# Patient Record
Sex: Female | Born: 1950
Health system: Southern US, Community
[De-identification: ages and names within clinical notes are randomized; demographics above are authoritative.]

## PROBLEM LIST (undated history)

## (undated) DIAGNOSIS — Z974 Presence of external hearing-aid: Secondary | ICD-10-CM

## (undated) DIAGNOSIS — I10 Essential (primary) hypertension: Secondary | ICD-10-CM

## (undated) DIAGNOSIS — N289 Disorder of kidney and ureter, unspecified: Secondary | ICD-10-CM

## (undated) DIAGNOSIS — K5792 Diverticulitis of intestine, part unspecified, without perforation or abscess without bleeding: Secondary | ICD-10-CM

## (undated) DIAGNOSIS — E785 Hyperlipidemia, unspecified: Secondary | ICD-10-CM

## (undated) DIAGNOSIS — G4730 Sleep apnea, unspecified: Secondary | ICD-10-CM

## (undated) DIAGNOSIS — I739 Peripheral vascular disease, unspecified: Secondary | ICD-10-CM

## (undated) DIAGNOSIS — R51 Headache: Secondary | ICD-10-CM

## (undated) DIAGNOSIS — N189 Chronic kidney disease, unspecified: Secondary | ICD-10-CM

## (undated) DIAGNOSIS — F419 Anxiety disorder, unspecified: Secondary | ICD-10-CM

## (undated) DIAGNOSIS — R569 Unspecified convulsions: Secondary | ICD-10-CM

## (undated) DIAGNOSIS — R519 Headache, unspecified: Secondary | ICD-10-CM

## (undated) DIAGNOSIS — R29898 Other symptoms and signs involving the musculoskeletal system: Secondary | ICD-10-CM

## (undated) DIAGNOSIS — F329 Major depressive disorder, single episode, unspecified: Secondary | ICD-10-CM

## (undated) DIAGNOSIS — M1990 Unspecified osteoarthritis, unspecified site: Secondary | ICD-10-CM

## (undated) DIAGNOSIS — T7840XA Allergy, unspecified, initial encounter: Secondary | ICD-10-CM

## (undated) DIAGNOSIS — E119 Type 2 diabetes mellitus without complications: Secondary | ICD-10-CM

## (undated) DIAGNOSIS — F32A Depression, unspecified: Secondary | ICD-10-CM

## (undated) DIAGNOSIS — E669 Obesity, unspecified: Secondary | ICD-10-CM

## (undated) DIAGNOSIS — E1140 Type 2 diabetes mellitus with diabetic neuropathy, unspecified: Secondary | ICD-10-CM

## (undated) DIAGNOSIS — Z9189 Other specified personal risk factors, not elsewhere classified: Secondary | ICD-10-CM

## (undated) DIAGNOSIS — C801 Malignant (primary) neoplasm, unspecified: Secondary | ICD-10-CM

## (undated) DIAGNOSIS — K219 Gastro-esophageal reflux disease without esophagitis: Secondary | ICD-10-CM

## (undated) DIAGNOSIS — I639 Cerebral infarction, unspecified: Secondary | ICD-10-CM

## (undated) HISTORY — DX: Essential (primary) hypertension: I10

## (undated) HISTORY — DX: Major depressive disorder, single episode, unspecified: F32.9

## (undated) HISTORY — DX: Unspecified osteoarthritis, unspecified site: M19.90

## (undated) HISTORY — DX: Gastro-esophageal reflux disease without esophagitis: K21.9

## (undated) HISTORY — PX: PATELLA FRACTURE SURGERY: SHX735

## (undated) HISTORY — DX: Allergy, unspecified, initial encounter: T78.40XA

## (undated) HISTORY — DX: Type 2 diabetes mellitus without complications: E11.9

## (undated) HISTORY — DX: Other specified personal risk factors, not elsewhere classified: Z91.89

## (undated) HISTORY — DX: Chronic kidney disease, unspecified: N18.9

## (undated) HISTORY — DX: Hyperlipidemia, unspecified: E78.5

## (undated) HISTORY — DX: Depression, unspecified: F32.A

## (undated) HISTORY — DX: Obesity, unspecified: E66.9

## (undated) HISTORY — DX: Diverticulitis of intestine, part unspecified, without perforation or abscess without bleeding: K57.92

---
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
## 1978-03-19 DIAGNOSIS — I639 Cerebral infarction, unspecified: Secondary | ICD-10-CM

## 1978-03-19 HISTORY — DX: Cerebral infarction, unspecified: I63.9

## 1984-03-19 DIAGNOSIS — Z789 Other specified health status: Secondary | ICD-10-CM

## 1984-03-19 HISTORY — PX: ABDOMINAL HYSTERECTOMY: SHX81

## 1984-03-19 HISTORY — DX: Other specified health status: Z78.9

## 2003-03-20 HISTORY — PX: WRIST FRACTURE SURGERY: SHX121

## 2003-04-15 ENCOUNTER — Other Ambulatory Visit: Payer: Self-pay

## 2008-07-29 ENCOUNTER — Ambulatory Visit: Payer: Self-pay | Admitting: Gastroenterology

## 2008-08-03 LAB — HM COLONOSCOPY

## 2008-10-27 ENCOUNTER — Emergency Department: Payer: Self-pay | Admitting: Emergency Medicine

## 2009-09-10 ENCOUNTER — Emergency Department: Payer: Self-pay | Admitting: Unknown Physician Specialty

## 2010-03-19 DIAGNOSIS — E119 Type 2 diabetes mellitus without complications: Secondary | ICD-10-CM

## 2010-03-19 HISTORY — DX: Type 2 diabetes mellitus without complications: E11.9

## 2010-03-19 HISTORY — PX: CHOLECYSTECTOMY: SHX55

## 2010-08-18 ENCOUNTER — Ambulatory Visit: Payer: Self-pay | Admitting: Internal Medicine

## 2010-09-11 ENCOUNTER — Inpatient Hospital Stay: Payer: Self-pay | Admitting: Internal Medicine

## 2010-09-17 ENCOUNTER — Ambulatory Visit: Payer: Self-pay | Admitting: Internal Medicine

## 2010-10-18 ENCOUNTER — Ambulatory Visit: Payer: Self-pay | Admitting: Surgery

## 2010-10-24 ENCOUNTER — Ambulatory Visit: Payer: Self-pay | Admitting: Surgery

## 2010-10-25 LAB — PATHOLOGY REPORT

## 2010-12-20 ENCOUNTER — Ambulatory Visit: Payer: Self-pay | Admitting: Internal Medicine

## 2011-01-18 ENCOUNTER — Ambulatory Visit: Payer: Self-pay | Admitting: Internal Medicine

## 2011-03-14 ENCOUNTER — Emergency Department: Payer: Self-pay | Admitting: Emergency Medicine

## 2011-05-17 ENCOUNTER — Ambulatory Visit: Payer: Self-pay | Admitting: Oncology

## 2011-05-17 LAB — CBC CANCER CENTER
Basophil %: 0.8 %
Eosinophil %: 3.8 %
HCT: 31.9 % — ABNORMAL LOW (ref 35.0–47.0)
HGB: 10.9 g/dL — ABNORMAL LOW (ref 12.0–16.0)
Lymphocyte #: 3.1 x10 3/mm (ref 1.0–3.6)
MCH: 30.1 pg (ref 26.0–34.0)
MCV: 88 fL (ref 80–100)
Monocyte #: 0.6 x10 3/mm (ref 0.0–0.7)
Neutrophil #: 3.8 x10 3/mm (ref 1.4–6.5)
Neutrophil %: 47.6 %
RBC: 3.62 10*6/uL — ABNORMAL LOW (ref 3.80–5.20)
RDW: 14.7 % — ABNORMAL HIGH (ref 11.5–14.5)

## 2011-05-17 LAB — COMPREHENSIVE METABOLIC PANEL
Albumin: 4.1 g/dL (ref 3.4–5.0)
Alkaline Phosphatase: 97 U/L (ref 50–136)
Calcium, Total: 9.2 mg/dL (ref 8.5–10.1)
EGFR (Non-African Amer.): 35 — ABNORMAL LOW
Glucose: 199 mg/dL — ABNORMAL HIGH (ref 65–99)
Osmolality: 285 (ref 275–301)
SGOT(AST): 24 U/L (ref 15–37)
SGPT (ALT): 33 U/L
Sodium: 138 mmol/L (ref 136–145)
Total Protein: 8.2 g/dL (ref 6.4–8.2)

## 2011-05-18 ENCOUNTER — Ambulatory Visit: Payer: Self-pay | Admitting: Oncology

## 2011-11-15 ENCOUNTER — Ambulatory Visit: Payer: Self-pay | Admitting: Oncology

## 2011-11-15 LAB — BASIC METABOLIC PANEL
BUN: 27 mg/dL — ABNORMAL HIGH (ref 7–18)
Chloride: 101 mmol/L (ref 98–107)
Co2: 26 mmol/L (ref 21–32)
Creatinine: 2.09 mg/dL — ABNORMAL HIGH (ref 0.60–1.30)
EGFR (African American): 30 — ABNORMAL LOW
Glucose: 293 mg/dL — ABNORMAL HIGH (ref 65–99)
Potassium: 4.2 mmol/L (ref 3.5–5.1)
Sodium: 135 mmol/L — ABNORMAL LOW (ref 136–145)

## 2011-11-15 LAB — CBC CANCER CENTER
Basophil %: 0.9 %
Eosinophil #: 0.2 x10 3/mm (ref 0.0–0.7)
HCT: 37.4 % (ref 35.0–47.0)
HGB: 12.3 g/dL (ref 12.0–16.0)
Lymphocyte %: 25.1 %
MCH: 29.8 pg (ref 26.0–34.0)
MCHC: 32.8 g/dL (ref 32.0–36.0)
Monocyte #: 0.9 x10 3/mm (ref 0.2–0.9)
Neutrophil #: 6.9 x10 3/mm — ABNORMAL HIGH (ref 1.4–6.5)
WBC: 10.8 x10 3/mm (ref 3.6–11.0)

## 2011-11-16 LAB — PROT IMMUNOELECTROPHORES(ARMC)

## 2011-11-18 ENCOUNTER — Ambulatory Visit: Payer: Self-pay | Admitting: Oncology

## 2012-05-13 ENCOUNTER — Ambulatory Visit: Payer: Self-pay | Admitting: Oncology

## 2012-05-17 ENCOUNTER — Ambulatory Visit: Payer: Self-pay | Admitting: Oncology

## 2012-06-12 LAB — CBC CANCER CENTER
Basophil #: 0.1 x10 3/mm (ref 0.0–0.1)
Eosinophil #: 0.3 x10 3/mm (ref 0.0–0.7)
HCT: 39.5 % (ref 35.0–47.0)
HGB: 13.5 g/dL (ref 12.0–16.0)
MCH: 30.3 pg (ref 26.0–34.0)
MCHC: 34 g/dL (ref 32.0–36.0)
MCV: 89 fL (ref 80–100)
Monocyte #: 0.7 x10 3/mm (ref 0.2–0.9)
Neutrophil #: 5.4 x10 3/mm (ref 1.4–6.5)
Neutrophil %: 55.9 %
Platelet: 305 x10 3/mm (ref 150–440)
RBC: 4.44 10*6/uL (ref 3.80–5.20)

## 2012-06-12 LAB — BASIC METABOLIC PANEL
Anion Gap: 8 (ref 7–16)
BUN: 28 mg/dL — ABNORMAL HIGH (ref 7–18)
Calcium, Total: 9.1 mg/dL (ref 8.5–10.1)
EGFR (Non-African Amer.): 30 — ABNORMAL LOW
Glucose: 292 mg/dL — ABNORMAL HIGH (ref 65–99)
Osmolality: 288 (ref 275–301)

## 2012-06-17 ENCOUNTER — Ambulatory Visit: Payer: Self-pay | Admitting: Oncology

## 2012-12-09 ENCOUNTER — Ambulatory Visit: Payer: Self-pay | Admitting: Oncology

## 2012-12-09 LAB — BASIC METABOLIC PANEL
Anion Gap: 10 (ref 7–16)
Chloride: 101 mmol/L (ref 98–107)
Co2: 27 mmol/L (ref 21–32)
Creatinine: 1.44 mg/dL — ABNORMAL HIGH (ref 0.60–1.30)
EGFR (Non-African Amer.): 40 — ABNORMAL LOW
Glucose: 266 mg/dL — ABNORMAL HIGH (ref 65–99)
Osmolality: 288 (ref 275–301)
Potassium: 4.4 mmol/L (ref 3.5–5.1)
Sodium: 138 mmol/L (ref 136–145)

## 2012-12-09 LAB — CBC CANCER CENTER
Basophil #: 0.1 x10 3/mm (ref 0.0–0.1)
Basophil %: 1.2 %
Eosinophil %: 3.8 %
HCT: 38.7 % (ref 35.0–47.0)
Lymphocyte %: 34.8 %
MCH: 30 pg (ref 26.0–34.0)
MCHC: 33.8 g/dL (ref 32.0–36.0)
Neutrophil #: 4 x10 3/mm (ref 1.4–6.5)
Neutrophil %: 51 %
Platelet: 303 x10 3/mm (ref 150–440)
RDW: 13.6 % (ref 11.5–14.5)
WBC: 7.8 x10 3/mm (ref 3.6–11.0)

## 2012-12-10 LAB — KAPPA/LAMBDA FREE LIGHT CHAINS (ARMC)

## 2012-12-10 LAB — PROT IMMUNOELECTROPHORES(ARMC)

## 2012-12-12 LAB — URINE IEP, RANDOM

## 2012-12-17 ENCOUNTER — Ambulatory Visit: Payer: Self-pay | Admitting: Oncology

## 2013-06-08 ENCOUNTER — Ambulatory Visit: Payer: Self-pay | Admitting: Oncology

## 2013-06-09 LAB — CBC CANCER CENTER
BASOS PCT: 1.2 %
Basophil #: 0.1 x10 3/mm (ref 0.0–0.1)
Eosinophil #: 0.3 x10 3/mm (ref 0.0–0.7)
Eosinophil %: 4.2 %
HCT: 39.2 % (ref 35.0–47.0)
HGB: 12.8 g/dL (ref 12.0–16.0)
Lymphocyte #: 3.6 x10 3/mm (ref 1.0–3.6)
Lymphocyte %: 43.5 %
MCH: 29.5 pg (ref 26.0–34.0)
MCHC: 32.6 g/dL (ref 32.0–36.0)
MCV: 90 fL (ref 80–100)
MONO ABS: 0.7 x10 3/mm (ref 0.2–0.9)
MONOS PCT: 8.1 %
NEUTROS ABS: 3.6 x10 3/mm (ref 1.4–6.5)
NEUTROS PCT: 43 %
Platelet: 332 x10 3/mm (ref 150–440)
RBC: 4.34 10*6/uL (ref 3.80–5.20)
RDW: 13.7 % (ref 11.5–14.5)
WBC: 8.3 x10 3/mm (ref 3.6–11.0)

## 2013-06-09 LAB — BASIC METABOLIC PANEL
ANION GAP: 10 (ref 7–16)
BUN: 24 mg/dL — ABNORMAL HIGH (ref 7–18)
CO2: 28 mmol/L (ref 21–32)
Calcium, Total: 10.3 mg/dL — ABNORMAL HIGH (ref 8.5–10.1)
Chloride: 101 mmol/L (ref 98–107)
Creatinine: 1.3 mg/dL (ref 0.60–1.30)
EGFR (Non-African Amer.): 45 — ABNORMAL LOW
GFR CALC AF AMER: 52 — AB
GLUCOSE: 194 mg/dL — AB (ref 65–99)
OSMOLALITY: 287 (ref 275–301)
POTASSIUM: 3.9 mmol/L (ref 3.5–5.1)
Sodium: 139 mmol/L (ref 136–145)

## 2013-06-10 LAB — KAPPA/LAMBDA FREE LIGHT CHAINS (ARMC)

## 2013-06-10 LAB — PROT IMMUNOELECTROPHORES(ARMC)

## 2013-06-11 LAB — URINE IEP, RANDOM

## 2013-06-17 ENCOUNTER — Ambulatory Visit: Payer: Self-pay | Admitting: Oncology

## 2013-11-12 ENCOUNTER — Inpatient Hospital Stay: Payer: Self-pay | Admitting: Internal Medicine

## 2013-11-12 LAB — CBC WITH DIFFERENTIAL/PLATELET
BASOS PCT: 1.1 %
Basophil #: 0.1 10*3/uL (ref 0.0–0.1)
Eosinophil #: 0.2 10*3/uL (ref 0.0–0.7)
Eosinophil %: 1.8 %
HCT: 43.1 % (ref 35.0–47.0)
HGB: 14.5 g/dL (ref 12.0–16.0)
Lymphocyte #: 2 10*3/uL (ref 1.0–3.6)
Lymphocyte %: 23.8 %
MCH: 30.1 pg (ref 26.0–34.0)
MCHC: 33.6 g/dL (ref 32.0–36.0)
MCV: 90 fL (ref 80–100)
Monocyte #: 0.5 x10 3/mm (ref 0.2–0.9)
Monocyte %: 6 %
NEUTROS PCT: 67.3 %
Neutrophil #: 5.7 10*3/uL (ref 1.4–6.5)
Platelet: 270 10*3/uL (ref 150–440)
RBC: 4.8 10*6/uL (ref 3.80–5.20)
RDW: 13.2 % (ref 11.5–14.5)
WBC: 8.4 10*3/uL (ref 3.6–11.0)

## 2013-11-12 LAB — DRUG SCREEN, URINE

## 2013-11-12 LAB — COMPREHENSIVE METABOLIC PANEL
ALBUMIN: 3.5 g/dL (ref 3.4–5.0)
ALK PHOS: 167 U/L — AB
Anion Gap: 16 (ref 7–16)
BILIRUBIN TOTAL: 0.7 mg/dL (ref 0.2–1.0)
BUN: 18 mg/dL (ref 7–18)
Calcium, Total: 9.5 mg/dL (ref 8.5–10.1)
Chloride: 93 mmol/L — ABNORMAL LOW (ref 98–107)
Co2: 19 mmol/L — ABNORMAL LOW (ref 21–32)
Creatinine: 1.38 mg/dL — ABNORMAL HIGH (ref 0.60–1.30)
GFR CALC AF AMER: 48 — AB
GFR CALC NON AF AMER: 41 — AB
GLUCOSE: 793 mg/dL — AB (ref 65–99)
Osmolality: 298 (ref 275–301)
Potassium: 4.1 mmol/L (ref 3.5–5.1)
SGOT(AST): 30 U/L (ref 15–37)
SGPT (ALT): 33 U/L
Sodium: 128 mmol/L — ABNORMAL LOW (ref 136–145)
TOTAL PROTEIN: 8 g/dL (ref 6.4–8.2)

## 2013-11-12 LAB — URINALYSIS, COMPLETE
BLOOD: NEGATIVE
Bilirubin,UR: NEGATIVE
Glucose,UR: >=500 mg/dL (ref 0–75)
KETONE: NEGATIVE
LEUKOCYTE ESTERASE: NEGATIVE
Nitrite: NEGATIVE
PH: 6 (ref 4.5–8.0)
RBC,UR: <1 /HPF (ref 0–5)
Specific Gravity: 1.022 (ref 1.003–1.030)

## 2013-11-12 LAB — LIPID PANEL
Cholesterol: 242 mg/dL — ABNORMAL HIGH (ref 0–200)
HDL Cholesterol: 22 mg/dL — ABNORMAL LOW (ref 40–60)
Triglycerides: 1562 mg/dL — ABNORMAL HIGH (ref 0–200)

## 2013-11-12 LAB — ETHANOL: Ethanol: <3 mg/dL

## 2013-11-12 LAB — BASIC METABOLIC PANEL
ANION GAP: 10 (ref 7–16)
BUN: 17 mg/dL (ref 7–18)
CHLORIDE: 105 mmol/L (ref 98–107)
CO2: 23 mmol/L (ref 21–32)
Calcium, Total: 7.7 mg/dL — ABNORMAL LOW (ref 8.5–10.1)
Creatinine: 1.04 mg/dL (ref 0.60–1.30)
EGFR (African American): >60
EGFR (Non-African Amer.): 58 — ABNORMAL LOW
GLUCOSE: 341 mg/dL — AB (ref 65–99)
OSMOLALITY: 291 (ref 275–301)
POTASSIUM: 3.4 mmol/L — AB (ref 3.5–5.1)
Sodium: 138 mmol/L (ref 136–145)

## 2013-11-12 LAB — AMYLASE: Amylase: 84 U/L (ref 25–115)

## 2013-11-12 LAB — LIPASE, BLOOD: LIPASE: 276 U/L (ref 73–393)

## 2013-11-12 LAB — HEMOGLOBIN A1C: Hemoglobin A1C: 13.4 % — ABNORMAL HIGH (ref 4.2–6.3)

## 2013-11-13 LAB — CBC WITH DIFFERENTIAL/PLATELET
BASOS PCT: 1.1 %
Basophil #: 0.1 10*3/uL (ref 0.0–0.1)
Eosinophil #: 0.2 10*3/uL (ref 0.0–0.7)
Eosinophil %: 2.3 %
HCT: 36.3 % (ref 35.0–47.0)
HGB: 12.6 g/dL (ref 12.0–16.0)
Lymphocyte #: 2.7 10*3/uL (ref 1.0–3.6)
Lymphocyte %: 33.5 %
MCH: 30.1 pg (ref 26.0–34.0)
MCHC: 34.6 g/dL (ref 32.0–36.0)
MCV: 87 fL (ref 80–100)
Monocyte #: 0.5 x10 3/mm (ref 0.2–0.9)
Monocyte %: 6.6 %
NEUTROS ABS: 4.5 10*3/uL (ref 1.4–6.5)
NEUTROS PCT: 56.5 %
Platelet: 251 10*3/uL (ref 150–440)
RBC: 4.17 10*6/uL (ref 3.80–5.20)
RDW: 13 % (ref 11.5–14.5)
WBC: 7.9 10*3/uL (ref 3.6–11.0)

## 2013-11-13 LAB — BASIC METABOLIC PANEL
ANION GAP: 9 (ref 7–16)
BUN: 8 mg/dL (ref 7–18)
Calcium, Total: 8.1 mg/dL — ABNORMAL LOW (ref 8.5–10.1)
Chloride: 104 mmol/L (ref 98–107)
Co2: 23 mmol/L (ref 21–32)
Creatinine: 0.74 mg/dL (ref 0.60–1.30)
EGFR (African American): >60
EGFR (Non-African Amer.): >60
Glucose: 253 mg/dL — ABNORMAL HIGH (ref 65–99)
OSMOLALITY: 279 (ref 275–301)
POTASSIUM: 4.1 mmol/L (ref 3.5–5.1)
SODIUM: 136 mmol/L (ref 136–145)

## 2013-11-13 LAB — MAGNESIUM: Magnesium: 1.3 mg/dL — ABNORMAL LOW

## 2013-12-08 ENCOUNTER — Ambulatory Visit: Payer: Self-pay | Admitting: Oncology

## 2013-12-08 LAB — CBC CANCER CENTER
BASOS ABS: 0.1 x10 3/mm (ref 0.0–0.1)
Basophil %: 1.4 %
EOS ABS: 0.2 x10 3/mm (ref 0.0–0.7)
Eosinophil %: 2.6 %
HCT: 41.5 % (ref 35.0–47.0)
HGB: 13.9 g/dL (ref 12.0–16.0)
Lymphocyte #: 3 x10 3/mm (ref 1.0–3.6)
Lymphocyte %: 34.8 %
MCH: 29.5 pg (ref 26.0–34.0)
MCHC: 33.4 g/dL (ref 32.0–36.0)
MCV: 88 fL (ref 80–100)
MONO ABS: 0.7 x10 3/mm (ref 0.2–0.9)
MONOS PCT: 8 %
NEUTROS PCT: 53.2 %
Neutrophil #: 4.6 x10 3/mm (ref 1.4–6.5)
Platelet: 323 x10 3/mm (ref 150–440)
RBC: 4.7 10*6/uL (ref 3.80–5.20)
RDW: 12.9 % (ref 11.5–14.5)
WBC: 8.6 x10 3/mm (ref 3.6–11.0)

## 2013-12-08 LAB — BASIC METABOLIC PANEL
ANION GAP: 12 (ref 7–16)
BUN: 17 mg/dL (ref 7–18)
CREATININE: 1.12 mg/dL (ref 0.60–1.30)
Calcium, Total: 9.4 mg/dL (ref 8.5–10.1)
Chloride: 95 mmol/L — ABNORMAL LOW (ref 98–107)
Co2: 24 mmol/L (ref 21–32)
EGFR (African American): >60
GFR CALC NON AF AMER: 53 — AB
GLUCOSE: 347 mg/dL — AB (ref 65–99)
OSMOLALITY: 278 (ref 275–301)
POTASSIUM: 4.1 mmol/L (ref 3.5–5.1)
Sodium: 131 mmol/L — ABNORMAL LOW (ref 136–145)

## 2013-12-10 LAB — URINE IEP, RANDOM

## 2013-12-15 LAB — KAPPA/LAMBDA FREE LIGHT CHAINS (ARMC)

## 2013-12-15 LAB — PROT IMMUNOELECTROPHORES(ARMC)

## 2013-12-17 ENCOUNTER — Ambulatory Visit: Payer: Self-pay | Admitting: Oncology

## 2014-06-15 ENCOUNTER — Ambulatory Visit
Admit: 2014-06-15 | Disposition: A | Discharge status: Home / Self Care | Payer: Self-pay | Attending: Oncology | Admitting: Oncology

## 2014-06-15 LAB — BASIC METABOLIC PANEL
Anion Gap: 5 — ABNORMAL LOW (ref 7–16)
BUN: 23 mg/dL — ABNORMAL HIGH
CREATININE: 1.07 mg/dL — AB
Calcium, Total: 9.1 mg/dL
Chloride: 100 mmol/L — ABNORMAL LOW
Co2: 24 mmol/L
EGFR (African American): >60
EGFR (Non-African Amer.): 56 — ABNORMAL LOW
GLUCOSE: 405 mg/dL — AB
Potassium: 4.6 mmol/L
SODIUM: 129 mmol/L — AB

## 2014-06-15 LAB — CBC CANCER CENTER
BASOS PCT: 0.9 %
Basophil #: 0.1 x10 3/mm (ref 0.0–0.1)
Eosinophil #: 0.3 x10 3/mm (ref 0.0–0.7)
Eosinophil %: 3.1 %
HCT: 40.6 % (ref 35.0–47.0)
HGB: 13.4 g/dL (ref 12.0–16.0)
Lymphocyte #: 3.3 x10 3/mm (ref 1.0–3.6)
Lymphocyte %: 34.9 %
MCH: 29.9 pg (ref 26.0–34.0)
MCHC: 32.9 g/dL (ref 32.0–36.0)
MCV: 91 fL (ref 80–100)
MONO ABS: 0.7 x10 3/mm (ref 0.2–0.9)
Monocyte %: 7.4 %
NEUTROS PCT: 53.7 %
Neutrophil #: 5.1 x10 3/mm (ref 1.4–6.5)
PLATELETS: 310 x10 3/mm (ref 150–440)
RBC: 4.47 10*6/uL (ref 3.80–5.20)
RDW: 14 % (ref 11.5–14.5)
WBC: 9.4 x10 3/mm (ref 3.6–11.0)

## 2014-06-16 LAB — PROT IMMUNOELECTROPHORES(ARMC)

## 2014-06-16 LAB — URINE IEP, RANDOM

## 2014-06-16 LAB — KAPPA/LAMBDA FREE LIGHT CHAINS (ARMC)

## 2014-06-18 ENCOUNTER — Ambulatory Visit
Admit: 2014-06-18 | Disposition: A | Discharge status: Home / Self Care | Payer: Self-pay | Attending: Oncology | Admitting: Oncology

## 2014-07-02 LAB — LIPID PANEL
Cholesterol: 166 mg/dL (ref 0–200)
HDL: 36 mg/dL (ref 35–70)
Triglycerides: 488 mg/dL — AB (ref 40–160)

## 2014-07-02 LAB — HEPATIC FUNCTION PANEL
ALT: 24 U/L (ref 7–35)
AST: 35 U/L (ref 13–35)
Alkaline Phosphatase: 149 U/L — AB (ref 25–125)
BILIRUBIN, TOTAL: 0.3 mg/dL

## 2014-07-02 LAB — BASIC METABOLIC PANEL
BUN: 20 mg/dL (ref 4–21)
Creatinine: 1 mg/dL (ref 0.5–1.1)
GLUCOSE: 307 mg/dL
POTASSIUM: 4.8 mmol/L (ref 3.4–5.3)
SODIUM: 140 mmol/L (ref 137–147)

## 2014-07-02 LAB — HEMOGLOBIN A1C: HEMOGLOBIN A1C: 12.7 % — AB (ref 4.0–6.0)

## 2014-07-06 ENCOUNTER — Encounter: Payer: Self-pay | Admitting: *Deleted

## 2014-07-10 NOTE — H&P (Signed)
PATIENT NAME:  Kara Leach, Kara Leach MR#:  [id number] DATE OF BIRTH:  [birth year]  DATE OF ADMISSION:  11/12/2013    ADDENDUM  ASSESSMENT AND PLAN: -Right leg swelling. We will check venous Doppler. -Pseudohyponatremia,  this will be corrected when her blood sugar is corrected.    ____________________________ Albertine Patricia, MD dse:dw D: 11/12/2013 02:56:58 ET T: 11/12/2013 04:09:27 ET JOB#: [id number]  cc: Albertine Patricia, MD, <Dictator> Annell Canty Graciela Husbands MD ELECTRONICALLY SIGNED 11/12/2013 23:40

## 2014-07-10 NOTE — Discharge Summary (Signed)
PATIENT NAME:  Kara Leach, RATHJE MR#:  [id number] DATE OF BIRTH:  Jul 21, 1950  DATE OF ADMISSION:  11/12/2013 DATE OF DISCHARGE:  11/14/2013  DISCHARGE DIAGNOSES:  1.  Hyperosmolar nonketotic state.  2.  Diabetes mellitus type 2, which is poorly controlled. The patient is noncompliant with medications because of financial issues. 3.  Severe hypertriglyceridemia.  4.  Hypertension.   DISCHARGE MEDICATIONS:  1.  Zoloft 100 mg p.o. daily.  2.  Amlodipine 2.5 mg at bedtime.  3.  Ranitidine 150 mg p.o. b.i.d.  4.  Aspirin 81 mg daily.  5.  Allopurinol 300 mg p.o. daily.  6.  Vitamin D 3000 units daily.  7.  Astelin 137 mcg 2 sprays 2 times daily.  8.  Latanoprost 0.005% 1 drop affected eye once a day.  9.  Tricor 145 mg p.o. daily.  10. Insulin aspart 100 units/mL 7 units t.i.d. before meals.  11. Levemir 12 units at bedtime.  12. Simvastatin 20 mg p.o. daily.  13. Toprol-XL 25 mg p.o. daily.  14. Colace 100 mg a day for constipation.   DIET: Low sodium, ADA diet.   CONSULTATIONS: Inpatient diabetic team consult. The patient advised to continue fish oil 1 gram p.o. 4 times daily.   HOSPITAL COURSE: A 64 year old female patient with history of diabetes who came in because of altered mental status and hyperglycemia. The patient has a history of diabetes for a long time and history of hypertension, hyperlipidemia; brought in because of altered mental status. The patient's found to have blood sugar of 793 and creatinine was 1.38. The patient's head CT did not show any acute changes. She was combative in the Emergency Room. Received some Versed and Haldol. Admitted to ICU for hyperosmolar nonketotic state. The patient was given IV fluids, IV insulin drip. Thought to have acute encephalopathy secondary to hyperglycemia. The patient's symptoms improved. She became more alert and oriented as her HONK corrected.   LABORATORY DATA: The patient's sodium was 128, creatinine was 1.38, glucose 793,  bicarbonate was 19, anion gap was 69 on admission.   ASSESSMENT AND PLAN:  1.  The patient's symptoms of DKA improved and her hemoglobin A1c was 13.4. She was seen by inpatient glycemic control team. The patient has been seeing Dr. Jeananne Rama on a regular basis at [facility] and the patient advised to purchase low-cost Rely-On insulin at [facility] and the patient advised to be compliant with medications. The patient has told me that she is not taking her diabetic medications for months due to financial reasons. She states her Social Security benefits are just enough for her to survive, so she was seen by a Tourist information centre manager, as well. Because she has insurance they could not help her and the patient sees Dr. Jeananne Rama for long time and patient also was given [facility] application. According to social worker note her income may not qualify for any assistance. She was given 2 weeks supply of all the medications from our [place].  2.  Severe hypertriglyceridemia.  The patient's lipid panel was done which showed triglycerides of 1562. She was taking her Tricor at home but because of noncompliance her triglycerides are very high here her lipase is normal. She was given low fat diet along with Tricor and simvastatin and fish oil.  I advised her to continue them and follow up with Dr. Jeananne Rama in 3 months regarding repeating the blood work.  3.  Metabolic encephalopathy with confusion secondary to severe hyperglycemia with HONK Her  CT is normal. Her neurological exam was normal. She was alert and oriented at the time of discharge.   DISCHARGE VITAL SIGNS:  Temperature 97.6, heart rate 81, blood pressure is 131/80, oxygen saturation 97% on room air.   Time spent on discharge preparation: More than 30 minutes.    ____________________________ Epifanio Lesches, MD sk:lt D: 11/15/2013 08:03:53 ET T: 11/15/2013 10:00:19 ET JOB#: [id number]  cc: Epifanio Lesches, MD, <Dictator> Epifanio Lesches  MD ELECTRONICALLY SIGNED 12/14/2013 15:32

## 2014-07-10 NOTE — H&P (Signed)
PATIENT NAME:  Kara Leach, GREINER MR#:  [id number] DATE OF BIRTH:  06-27-1950  DATE OF ADMISSION:  11/12/2013  REFERRING PHYSICIAN: Marjean Donna.  PRIMARY CARE PHYSICIAN: Guadalupe Maple, MD  ONCOLOGY:  Kathlene November. Grayland Ormond, MD following her for MGUS  CHIEF COMPLAINT: Altered mental status, hyperglycemia.   HISTORY OF PRESENT ILLNESS: This is a 64 year old female with known history of diabetes mellitus, insulin-dependent, MGUS, GERD, hypertension and hyperlipidemia, presents with altered mental status. Son at bedside gives most of the history. The patient was at her  usual state of health this morning, was noticed in the evening to be confused, combative, therefore he called EMS. She was so combative that had to give her 2 of Versed and 5 of IM Haldol. Upon presentation, I am not aware exactly of what time of onset of her symptoms, but there was no focal deficits, tingling, numbness, no loss of consciousness, or altered mental status, just agitation and combativeness. Family reports she had a similar episode when they were visiting [place] recently last month where she was found to be hyperglycemic. As well, the patient was found to have hyperglycemia with a glucose of 793. The family reports she was out of her medicine but they are unclear for how long. Due to financial reasons, she has run out of her medication. The patient is a very poor historian and cannot give any reliable history at this point. The patient had a creatinine of 1.38. Urinalysis was negative; only positive for benzodiazepine, which most likely is related to the Versed she was given at home by EMS; otherwise, urinalysis was negative and the rest of her work-up as not significant, besides osmolality of 298. The family reports there was no cough, no fever, no chills. The patient had negative meningeal signs. The patient had CT head done in ED which did not show any acute findings.   PAST MEDICAL HISTORY: 1. MGUS.  2. Diabetes  mellitus.  3. GERD.  4. Hypertension.  5. Hyperlipidemia.  6. Partial hysterectomy.  7. Tubal ligation.  8. Appendectomy.  9. Wrist surgery.  10. Cholecystectomy.   SOCIAL HISTORY: No tobacco. No alcohol use as per the family.   FAMILY HISTORY: Significant for pancreatic cancer in the father and congestive heart failure and diabetes in the mother.   ALLERGIES: IBUPROFEN AND STRAWBERRY.   HOME MEDICATIONS: As mentioned, the patient was not taking any home medications, but the family is not aware of any medication she has been taking at home, and this point, but I found this medication list from her most recent visit in March 2015: 1. Sertraline 100 mg oral daily.  2. Simvastatin 40 mg at bedtime.  3. Norvasc 2.5 mg daily. 4. Ranitidine 150 mg b.i.d. 5. Vitamin B12, 1000 mcg oral daily.  6. Aspirin 81 mg daily.  7. Latanoprost ophthalmic solution 0.005%, 1 drop each eye b.i.d.  8. Enalapril 2.5 mg oral daily.  9. Fenofibrate 67 mg oral 2 times a day.  10. Allopurinol oral daily.  11. Levemir 40 units subcutaneous in the day and 50 units subcutaneous at bedtime.  12. Apidra, sliding scale.  13. Vitamin D3, 1000 international units daily, but as mentioned earlier, this is the most recent list we have and the patient was not taking any medications, as she ran out of most of her medications due to financial reasons.   REVIEW OF SYSTEMS: The patient is confused and cannot  give any reliable review of systems.   PHYSICAL EXAMINATION: VITAL SIGNS:  Temperature 97.6, pulse 103, respiratory rate 23, blood pressure 173/104, saturating 99% on oxygen. Upon presentation, heart rate was 131, blood pressure was 188/109.   GENERAL: A well-nourished female who looks comfortable in bed, in no apparent distress.  HEENT: Head atraumatic, normocephalic. Pupils equal, reactive to light. Pink conjunctivae, anicteric sclerae, dry moist oral mucosa, cracked lips. No oral thrush. No pharyngeal erythema. No  nasal discharge.  NECK: Supple. No thyromegaly. No JVD. No carotid bruits. Trachea is midline. No lymphadenopathy.  CHEST: Good air entry bilaterally. No wheezing, rales or rhonchi. No use of accessory muscle.  CARDIOVASCULAR: S1, S2 heard. No rubs, murmur or gallops, regular but tachycardic. PMI nondisplaced.  ABDOMEN: Soft, nontender, nondistended. Bowel sounds present.  EXTREMITIES: The patient had right lower extremity edema around the calf area but no erythema, no tenderness. Has varicose veins there which seem to be chronic, and beside that no edema. Pedal pulses felt bilaterally +2. No clubbing. No cyanosis. PSYCHIATRIC: The patient is confused. Awake, alert x1. Conversant and cooperative but incoherent.  NEUROLOGIC: Cranial nerves grossly intact. Motor 5 out of 5. No focal deficits. Sensation symmetrical and intact to light touch.  MUSCULOSKELETAL: No joint effusion or erythema.  SKIN: Good skin turgor. Warm and dry.   PERTINENT LABORATORY DATA: Glucose 793, BUN 18, creatinine 1.38, sodium 128, potassium 4.1, chloride 93, CO2 19,  anion gap 16. Urine drug screen positive for benzodiazepine. White blood cell 8.4, hemoglobin 14.5, hematocrit 43.1, platelet 270,000.  CT head: No acute intracranial findings.   ASSESSMENT AND PLAN: 1. Acute encephalopathy. The patient presents with agitation and altered mental status. This is most likely metabolic secondary to her hyperosmolar hyperglycemia.  2. Hyperosmolar hyperglycemia. The patient's osmolality is 298, at the upper limits. She has had significant hyperglycemia. Will be admitted to CCU for insulin drip on DKA protocol. Will have her with aggressive hydration. We will monitor her BMP closely and replace as needed. Will hydrate. Once her chronic glucose improves, she will be transitioned to subcutaneous insulin. Given her financial concern would prefer to start her on Novolin 70/30.  3. Medication noncompliance due to financial reasons. We will  consult social worker.  4. Gastroesophageal reflux disease. Would resume her back on her ranitidine..  5. Hypertension, uncontrolled, elevated. Will resume her back on Norvasc and lisinopril. 6. Hyperlipidemia. Will resume her back on fenofibrate and statin.  7. Deep vein thrombosis prophylaxis. Subcutaneous heparin.   CODE STATUS: Full code as per son.   TOTAL TIME SPENT ON ADMISSION AND PATIENT CARE: 55 minutes.    ____________________________ Albertine Patricia, MD dse:dw D: 11/12/2013 02:55:18 ET T: 11/12/2013 03:26:58 ET JOB#: [id number]  cc: Albertine Patricia, MD, <Dictator> Dylanie Quesenberry Graciela Husbands MD ELECTRONICALLY SIGNED 11/12/2013 23:41

## 2014-08-12 ENCOUNTER — Encounter: Payer: Self-pay | Admitting: Endocrinology

## 2014-08-12 ENCOUNTER — Ambulatory Visit (INDEPENDENT_AMBULATORY_CARE_PROVIDER_SITE_OTHER): Payer: No Typology Code available for payment source | Admitting: Endocrinology

## 2014-08-12 ENCOUNTER — Encounter (INDEPENDENT_AMBULATORY_CARE_PROVIDER_SITE_OTHER): Payer: Self-pay

## 2014-08-12 VITALS — BP 124/76 | HR 104 | Resp 14 | Ht 60.0 in | Wt 162.8 lb

## 2014-08-12 DIAGNOSIS — E785 Hyperlipidemia, unspecified: Secondary | ICD-10-CM

## 2014-08-12 DIAGNOSIS — I1310 Hypertensive heart and chronic kidney disease without heart failure, with stage 1 through stage 4 chronic kidney disease, or unspecified chronic kidney disease: Secondary | ICD-10-CM | POA: Insufficient documentation

## 2014-08-12 DIAGNOSIS — I152 Hypertension secondary to endocrine disorders: Secondary | ICD-10-CM | POA: Insufficient documentation

## 2014-08-12 DIAGNOSIS — E1169 Type 2 diabetes mellitus with other specified complication: Secondary | ICD-10-CM | POA: Insufficient documentation

## 2014-08-12 DIAGNOSIS — E669 Obesity, unspecified: Secondary | ICD-10-CM | POA: Diagnosis not present

## 2014-08-12 DIAGNOSIS — E1122 Type 2 diabetes mellitus with diabetic chronic kidney disease: Secondary | ICD-10-CM

## 2014-08-12 DIAGNOSIS — N183 Chronic kidney disease, stage 3 (moderate): Secondary | ICD-10-CM

## 2014-08-12 DIAGNOSIS — N182 Chronic kidney disease, stage 2 (mild): Secondary | ICD-10-CM

## 2014-08-12 DIAGNOSIS — E1159 Type 2 diabetes mellitus with other circulatory complications: Secondary | ICD-10-CM | POA: Insufficient documentation

## 2014-08-12 DIAGNOSIS — I10 Essential (primary) hypertension: Secondary | ICD-10-CM | POA: Diagnosis not present

## 2014-08-12 DIAGNOSIS — N189 Chronic kidney disease, unspecified: Secondary | ICD-10-CM

## 2014-08-12 LAB — HM DIABETES FOOT EXAM: HM Diabetic Foot Exam: NORMAL

## 2014-08-12 NOTE — Progress Notes (Signed)
Reason for visit-  Kara Leach is a 64 y.o.-year-old female, referred by her PCP,  Kathrine Haddock, for management of Type 2 diabetes, uncontrolled, with complications ( stage 2/3 CKD, ?neuropathy).   HPI- Patient has been diagnosed with diabetes in 2012. Recalls being initially on lifestyle modifications.  Tried  Metformin, Glimeperide, Januvia. she has  been on insulin since 2014.   Didn't tolerate Metformin due to GI upset-nausea, vomiting.  Pt is currently on a regimen of: - Levemir 60 units qhs ( was earlier taking it as BID>switch to once daily about 2015) - Novolog 10 units qac TID, right after meals>>needs to change to Humalog due to insurance -Victoza  0.6 mg daily ( start April 2016)>due for 1.2 mg dose increase next week  *Victoza started last week, has 3 sample pens, waiting for PA result from PCP *hospitalization  2 years ago for dehydrated, august 2015 hyperglycemia to 800 secondary to non compliance   Last hemoglobin A1c was: Lab Results  Component Value Date   HGBA1C 12.7* 07/02/2014     Pt checks her sugars 3 a day . Uses GE glucometer. By recall they are:  PREMEAL Breakfast Lunch Dinner Bedtime Overall  Glucose range: 168-170 90-100  140-150   Mean/median:        POST-MEAL PC Breakfast PC Lunch PC Dinner  Glucose range:     Mean/median:       Hypoglycemia-  No lows. Lowest sugar was n/a; she has hypoglycemia awareness at 70. Feels symptoms of lows around 3-4pm, most of the days  Dietary habits- eats three times daily. Tries to limit carbs, sweetened beverages, sodas, desserts. BF-bowl of cereal, special K, Trop 50, hot tea Lunch-sandwich -ham and cheese on wheat bread, peaches fruit cup Supper- pork chop, carrots, french fries  Exercise- walks  3 times weekly  Weight -  Wt Readings from Last 3 Encounters:  08/12/14 162 lb 12 oz (73.823 kg)    Diabetes Complications-  Nephropathy- Yes Stage 2/3  CKD, last BUN/creatinine- GFR 61-70 Lab Results   Component Value Date   BUN 20 07/02/2014   CREATININE 1.0 07/02/2014   Sees Dr Holley Raring for once a diagnosis of stage 3 CKD, now getting better  Retinopathy- No, Last DEE was in March 2016, has cataract and at risk for [place] Neuropathy- mild numbness and tingling in )her hands, feet. No known neuropathy.  Associated history - No CAD . Prior mini- stroke, 1988, affected left side of face, affecting hearing. No hypothyroidism. her last TSH was No results found for: TSH  Hyperlipidemia-  her last set of lipids were- Currently on zocor 40mg  daily. Tolerating well.   Lab Results  Component Value Date   CHOL 166 07/02/2014   HDL 36 07/02/2014   TRIG 488* 07/02/2014    Blood Pressure/HTN- Patient's blood pressure is well controlled today on current regimen that includes ACE-I (enalapril).  Pt has FH of DM in mother.  I have reviewed the patient's past medical history, family and social history, surgical history, medications and allergies.  Past Medical History  Diagnosis Date  . Diabetes mellitus without complication   . Hyperlipidemia   . Hypertension   . Depression   . Chronic kidney disease    Past Surgical History  Procedure Laterality Date  . Appendectomy    . Abdominal hysterectomy     Family History  Problem Relation Age of Onset  . Diabetes Mother   . Heart disease Mother   . Hypertension Mother  History   Social History  . Marital Status: Widowed    Spouse Name: N/A  . Number of Children: N/A  . Years of Education: N/A   Occupational History  . Not on file.   Social History Main Topics  . Smoking status: Never Smoker   . Smokeless tobacco: Not on file  . Alcohol Use: 0.0 oz/week    0 Standard drinks or equivalent per week  . Drug Use: No  . Sexual Activity: Not on file   Other Topics Concern  . Not on file   Social History Narrative   Current Outpatient Prescriptions on File Prior to Visit  Medication Sig Dispense Refill  . allopurinol  (ZYLOPRIM) 300 MG tablet Take 300 mg by mouth daily.    Marland Kitchen amLODipine (NORVASC) 5 MG tablet Take 5 mg by mouth daily.    Marland Kitchen aspirin EC 81 MG tablet Take 81 mg by mouth daily.    . enalapril (VASOTEC) 2.5 MG tablet Take 2.5 mg by mouth daily.    . insulin aspart (NOVOLOG) 100 UNIT/ML injection Inject 10 Units into the skin 3 (three) times daily before meals.     . Insulin Detemir (LEVEMIR) 100 UNIT/ML Pen 60 units in evening    . Liraglutide 18 MG/3ML SOPN Inject 1.2 mg into the skin daily.    . metoprolol succinate (TOPROL-XL) 25 MG 24 hr tablet Take 25 mg by mouth daily.    . ranitidine (ZANTAC) 150 MG tablet Take 150 mg by mouth 2 (two) times daily.    . sertraline (ZOLOFT) 100 MG tablet Take 100 mg by mouth daily.    . simvastatin (ZOCOR) 40 MG tablet Take 40 mg by mouth daily.     No current facility-administered medications on file prior to visit.   Allergies  Allergen Reactions  . Ibuprofen Other (See Comments)    "ringing in ears"     Review of Systems: [x]  complains of  [  ] denies General:   [  ] Recent weight change [ x ] Fatigue  [ x ] Loss of appetite Eyes: [ x ]  Vision Difficulty [  ]  Eye pain ENT: [ x ]  Hearing difficulty [ x ]  Difficulty Swallowing CVS: [  ] Chest pain [  ]  Palpitations/Irregular Heart beat [  ]  Shortness of breath lying flat [  x] Swelling of legs Resp: [  ] Frequent Cough [  ] Shortness of Breath  [  ]  Wheezing GI: [ x ] Heartburn  [ x ] Nausea or Vomiting  [  ] Diarrhea [  ] Constipation  [  ] Abdominal Pain GU: [x  ]  Polyuria  [ x ]  nocturia Bones/joints:  [ x ]  Muscle aches  [ x ] Joint Pain  [  ] Bone pain Skin/Hair/Nails: [  ]  Rash  [  ] New stretch marks [  ]  Itching [  ] Hair loss [  ]  Excessive hair growth Reproduction: [  x] Low sexual desire , [  ]  Women: Menstrual cycle problems [  ]  Women: Breast Discharge [  ] Men: Difficulty with erections [  ]  Men: Enlarged Breasts CNS: [ x ] Frequent Headaches [  ] Blurry vision [ x ]  Tremors [  x] Seizures [  ] Loss of consciousness [  ] Localized weakness Endocrine: [ x ]  Excess thirst [  ]  Feeling excessively  hot [  ]  Feeling excessively cold Heme: [ x ]  Easy bruising [  ]  Enlarged glands or lumps in neck Allergy: [ x ]  Food allergies [ x ] Environmental allergies  PE: BP 124/76 mmHg  Pulse 104  Resp 14  Ht 5' (1.524 m)  Wt 162 lb 12 oz (73.823 kg)  BMI 31.78 kg/m2  SpO2 96% Wt Readings from Last 3 Encounters:  08/12/14 162 lb 12 oz (73.823 kg)   GENERAL: No acute distress, well developed HEENT:  Eye exam shows normal external appearance. Oral exam shows normal mucosa .  NECK:   Neck exam shows no lymphadenopathy. No Carotids bruits. Thyroid is not enlarged and no nodules felt.  no acanthosis nigricans LUNGS:         Chest is symmetrical. Lungs are clear to auscultation.Marland Kitchen   HEART:         Heart sounds:  S1 and S2 are normal. No murmurs or clicks heard. ABDOMEN:  No Distention present. Liver and spleen are not palpable. No other mass or tenderness present.  EXTREMITIES:     There is no edema. 2+ DP pulses  NEUROLOGICAL:     Grossly intact.            Diabetic foot exam done with shoes and socks removed: Normal Monofilament testing bilaterally. No deformities of toes.  Nails  Not dystrophic. Skin normal color. No open wounds. Dry skin.  MUSCULOSKELETAL:       There is no enlargement or gross deformity of the joints.  SKIN:       No rash  ASSESSMENT AND PLAN: Problem List Items Addressed This Visit      Cardiovascular and Mediastinum   HTN (hypertension)    Bp at target on current therapy that includes ACE-I.  Update urine MA at next few visits, when sugars are better.      Relevant Orders   Amb Referral to Nutrition and Diabetic E     Endocrine   Type 2 diabetes mellitus - Primary    Reviewed with the patient goal A1c and sugars and risks of long term complications from uncontrolled sugars.  Encouraged her to check her sugars 4 x daily.  Refer to  DM education.  Feel that basal/bolus mismatch needs to be corrected. Made insulin changes as below. Continue Victoza per titration schedule. Risk profile discussed. She is aware to report back if she does not tolerate due to nausea, vomiting, abdominal pain.  Since lunch is usually small, if she starts to notice lows in the afternoon, asked her to use half scale for now for lunch time.  Discussed foot care, eye exams, hypoglycemia.   Patient Instructions   Check sugars 4 x daily ( before each meal and at bedtime).  Record them in a log book and bring that/meter to next appointment.   Continue titrating up the Victoza each week. Report back if dont tolerate.  Change insulin as follows-  Blood Glucose (mg/dL)  Breakfast  (Units Humalog Insulin)  Lunch  (Units Humalog Insulin)  Supper  (Units Humalog Insulin)  Nighttime (Units Humalog Insulin)   <70 Treat the low blood sugar.  Recheck blood glucose  in 15 mins. If sugar is more than 70, then take the number of units of insulin in the 70-90 row, if before a meal.   70-90  10  10  10   0  91-130 (Base Dose)  16  16  16   0   131-150  17  17  17   0   151-200  18  18  18   0   201-250  19  19  19  0   251-300  20  20  20 1    301-350  21  21  21 2    351-400  22  22  22  3    401-450  23  23  23 4    >450  24  24  24  5    Levemir insulin 48 units subcutaneous Nightly     Please come back for a follow-up appointment in 2 weeks          Relevant Orders   Amb Referral to Nutrition and Diabetic E     Genitourinary   CKD (chronic kidney disease) stage 2, GFR 60-89 ml/min    Recent improvement in her CKD per patient- sees Dr Holley Raring.        Other   Hyperlipidemia    On statin. Tolerating well. Elevated Tg recently in setting of uncontrolled sugars.       Relevant Orders   Amb Referral to Nutrition and Diabetic E   Obesity    Encouraged lifestyle changes. Recently started on  GLP-1.            - Return to clinic in 2 weeks with sugar log/meter.  Denisa Enterline [facility] 08/15/2014 9:42 AM

## 2014-08-12 NOTE — Patient Instructions (Signed)
Check sugars 4 x daily ( before each meal and at bedtime).  Record them in a log book and bring that/meter to next appointment.   Continue titrating up the Victoza each week. Report back if dont tolerate.  Change insulin as follows-  Blood Glucose (mg/dL)  Breakfast  (Units Humalog Insulin)  Lunch  (Units Humalog Insulin)  Supper  (Units Humalog Insulin)  Nighttime (Units Humalog Insulin)   <70 Treat the low blood sugar.  Recheck blood glucose  in 15 mins. If sugar is more than 70, then take the number of units of insulin in the 70-90 row, if before a meal.   70-90  10  10  10   0  91-130 (Base Dose)  16  16  16   0   131-150  17  17  17   0   151-200  18  18  18   0   201-250  19  19  19  0   251-300  20  20  20 1    301-350  21  21  21 2    351-400  22  22  22  3    401-450  23  23  23 4    >450  24  24  24  5    Levemir insulin 48 units subcutaneous Nightly     Please come back for a follow-up appointment in 2 weeks

## 2014-08-12 NOTE — Progress Notes (Signed)
Pre visit review using our clinic review tool, if applicable. No additional management support is needed unless otherwise documented below in the visit note. 

## 2014-08-15 DIAGNOSIS — N183 Chronic kidney disease, stage 3 unspecified: Secondary | ICD-10-CM | POA: Insufficient documentation

## 2014-08-15 DIAGNOSIS — E669 Obesity, unspecified: Secondary | ICD-10-CM | POA: Insufficient documentation

## 2014-08-15 NOTE — Assessment & Plan Note (Signed)
On statin. Tolerating well. Elevated Tg recently in setting of uncontrolled sugars.

## 2014-08-15 NOTE — Assessment & Plan Note (Addendum)
Reviewed with the patient goal A1c and sugars and risks of long term complications from uncontrolled sugars.  Encouraged her to check her sugars 4 x daily.  Refer to DM education.  Feel that basal/bolus mismatch needs to be corrected. Made insulin changes as below. Continue Victoza per titration schedule. Risk profile discussed. She is aware to report back if she does not tolerate due to nausea, vomiting, abdominal pain.  Since lunch is usually small, if she starts to notice lows in the afternoon, asked her to use half scale for now for lunch time.  Discussed foot care, eye exams, hypoglycemia.   Patient Instructions   Check sugars 4 x daily ( before each meal and at bedtime).  Record them in a log book and bring that/meter to next appointment.   Continue titrating up the Victoza each week. Report back if dont tolerate.  Change insulin as follows-  Blood Glucose (mg/dL)  Breakfast  (Units Humalog Insulin)  Lunch  (Units Humalog Insulin)  Supper  (Units Humalog Insulin)  Nighttime (Units Humalog Insulin)   <70 Treat the low blood sugar.  Recheck blood glucose  in 15 mins. If sugar is more than 70, then take the number of units of insulin in the 70-90 row, if before a meal.   70-90  10  10  10   0  91-130 (Base Dose)  16  16  16   0   131-150  17  17  17   0   151-200  18  18  18   0   201-250  19  19  19  0   251-300  20  20  20 1    301-350  21  21  21 2    351-400  22  22  22  3    401-450  23  23  23 4    >450  24  24  24  5    Levemir insulin 48 units subcutaneous Nightly     Please come back for a follow-up appointment in 2 weeks

## 2014-08-15 NOTE — Assessment & Plan Note (Signed)
Bp at target on current therapy that includes ACE-I.  Update urine MA at next few visits, when sugars are better.

## 2014-08-15 NOTE — Assessment & Plan Note (Signed)
Encouraged lifestyle changes. Recently started on GLP-1.

## 2014-08-15 NOTE — Assessment & Plan Note (Signed)
Recent improvement in her CKD per patient- sees Dr Holley Raring.

## 2014-08-20 ENCOUNTER — Encounter: Payer: No Typology Code available for payment source | Attending: Endocrinology | Admitting: *Deleted

## 2014-08-20 ENCOUNTER — Encounter: Payer: Self-pay | Admitting: *Deleted

## 2014-08-20 VITALS — BP 124/82 | Ht 60.0 in | Wt 161.0 lb

## 2014-08-20 DIAGNOSIS — E1122 Type 2 diabetes mellitus with diabetic chronic kidney disease: Secondary | ICD-10-CM

## 2014-08-20 DIAGNOSIS — E119 Type 2 diabetes mellitus without complications: Secondary | ICD-10-CM | POA: Diagnosis not present

## 2014-08-20 NOTE — Progress Notes (Signed)
Diabetes Self-Management Education  Visit Type: First/Initial  Appt. Start Time: 0905 Appt. End Time: 1025  08/20/2014  Ms. Kara Leach, identified by name and date of birth, is a 64 y.o. female with a diagnosis of Diabetes: Type 2.    ASSESSMENT  Blood pressure 124/82, height 5' (1.524 m), weight 161 lb (73.029 kg). Body mass index is 31.44 kg/(m^2).  Initial Visit Information:  Are you currently following a meal plan?: No (She is looking into using Nurtrisystem) Are you taking your medications as prescribed?: Yes Are you checking your feet?: Yes How many days per week are you checking your feet?: 7 How often do you need to have someone help you when you read instructions, pamphlets, or other written materials from your doctor or pharmacy?: 1 - Never What is the last grade level you completed in school?: 12  Psychosocial:   Patient Belief/Attitude about Diabetes: Motivated to manage diabetes (Depressing) Self-care barriers: Hard of hearing Self-management support: Doctor's office, Family Patient Concerns: Nutrition/Meal planning, Medication, Monitoring, Healthy Lifestyle, Problem Solving, Glycemic Control Special Needs: None Preferred Learning Style: Visual, Hands on Learning Readiness: Change in progress  Complications:   Last HgB A1C per patient/outside source: 12.7 % (07/02/14) How often do you check your blood sugar?: 3-4 times/day Fasting Blood glucose range (mg/dL): 130-179 (FBG's in the last week range from 140-167 mg/dL - since starting Victoza) Postprandial Blood glucose range (mg/dL):  (Pre lunch 136-178; pre supper 133-136 mg/dL; bedtime 88-188 mg/dL) Number of hypoglycemic episodes per month: 1 (Reading of 88 mg/dL) Can you tell when your blood sugar is low?: Yes What do you do if your blood sugar is low?: glucose tabs; glucerna bar Have you had a dilated eye exam in the past 12 months?: Yes Have you had a dental exam in the past 12 months?: No  Diet  Intake:  Breakfast: cereal and milk Lunch: sandwich and carrot sticks Snack (afternoon): popcorn Dinner: meat and veggies; starches (son and family lives with patient and prepares food) Snack (evening): graham crackers and milk Beverage(s): juice with breakfast  Exercise:  Exercise: Light (walking) Light Exercise amount of time (min / week): 90  Individualized Plan for Diabetes Self-Management Training:   Learning Objective:  Patient will have a greater understanding of diabetes self-management.  Education Topics Reviewed with Patient Today:  Explored patient's options for treatment of their diabetes Role of diet in the treatment of diabetes and the relationship between the three main macronutrients and blood glucose level, Food label reading, portion sizes and measuring food. Helped patient identify appropriate exercises in relation to his/her diabetes, diabetes complications and other health issue. Taught/reviewed insulin injection, site rotation, insulin storage and needle disposal., Reviewed patients medication for diabetes, action, purpose, timing of dose and side effects. Purpose and frequency of SMBG., Identified appropriate SMBG and/or A1C goals. Taught treatment of hypoglycemia - the 15 rule. Relationship between chronic complications and blood glucose control Identified and addressed patients feelings and concerns about diabetes    PATIENTS GOALS/Plan (Developed by the patient):  Improve blood sugars Decrease medications Prevent diabetes complications Lose weight Lead a healthier lifestyle Become more fit Eat healthier  Plan:   Patient Instructions  Check blood sugars 3-4 x day before each meal and before bed every day  Exercise:  Continue walking for    30  minutes   3  days a week and gradually increase to 30 minutes 5 days a week as tolerated  Avoid sugar sweetened drinks (juices) unless treating a low  blood sugar  Eat 3 meals day,   1-2  snacks a  day Space meals 4-6 hours apart  Bring blood sugar records to the next class  Carry fast acting glucose and a snack at all times  Expected Outcomes:  Demonstrated interest in learning. Expect positive outcomes  Education material provided:  General Meal Planning Guidelines Symptoms, causes and treatments of Hypoglycemia  If problems or questions, patient to contact team via:   Johny Drilling, [place], Emanuel, CDE [phone]  Future DSME appointment: September 30, 2014 for Class 1

## 2014-08-20 NOTE — Patient Instructions (Addendum)
Check blood sugars 3-4 x day before each meal and before bed every day  Exercise:  Continue walking for    30  minutes   3  days a week and gradually increase to 30 minutes 5 days a week as tolerated  Avoid sugar sweetened drinks (juices) unless treating a low blood sugar  Eat 3 meals day,   1-2  snacks a day Space meals 4-6 hours apart  Bring blood sugar records to the next class  Carry fast acting glucose and a snack at all times

## 2014-08-26 ENCOUNTER — Ambulatory Visit (INDEPENDENT_AMBULATORY_CARE_PROVIDER_SITE_OTHER): Payer: No Typology Code available for payment source | Admitting: Endocrinology

## 2014-08-26 ENCOUNTER — Encounter: Payer: Self-pay | Admitting: Endocrinology

## 2014-08-26 VITALS — BP 118/68 | HR 89 | Resp 12 | Ht 60.0 in | Wt 162.5 lb

## 2014-08-26 DIAGNOSIS — E785 Hyperlipidemia, unspecified: Secondary | ICD-10-CM | POA: Diagnosis not present

## 2014-08-26 DIAGNOSIS — N182 Chronic kidney disease, stage 2 (mild): Secondary | ICD-10-CM

## 2014-08-26 DIAGNOSIS — N189 Chronic kidney disease, unspecified: Secondary | ICD-10-CM

## 2014-08-26 DIAGNOSIS — E1122 Type 2 diabetes mellitus with diabetic chronic kidney disease: Secondary | ICD-10-CM | POA: Diagnosis not present

## 2014-08-26 DIAGNOSIS — I10 Essential (primary) hypertension: Secondary | ICD-10-CM

## 2014-08-26 NOTE — Patient Instructions (Signed)
Check sugars 4 x daily ( before each meal and at bedtime).  Record them in a log book and bring that/meter to next appointment.  Change levemir to 50 units daily. Continue current Victoza. Start using the Humalog chart like you should three times daily before each meal.  As you start to notice low sugars <90, please give [place] a call for further adjustments.   Please come back for a follow-up appointment in 1 month.

## 2014-08-26 NOTE — Progress Notes (Signed)
Pre visit review using our clinic review tool, if applicable. No additional management support is needed unless otherwise documented below in the visit note. 

## 2014-08-26 NOTE — Progress Notes (Signed)
Reason for visit-  Kara Leach is a 64 y.o.-year-old female, referred by her PCP,  Kathrine Haddock, for management of Type 2 diabetes, uncontrolled, with complications ( stage 2/3 CKD, ?neuropathy). Last visit May 2016.   HPI- Patient has been diagnosed with diabetes in 2012. Recalls being initially on lifestyle modifications.  Tried  Metformin, Glimeperide, Januvia. she has  been on insulin since 2014.     *hospitalization  2 years ago for dehydrated, august 2015 hyperglycemia to 800 secondary to non compliance *Didn't tolerate Metformin due to GI upset-nausea, vomiting. *Novolog>>Humalog due to insurance last visit.   Pt is currently on a regimen of: - Levemir 48 units qhs ( dose adjusted last visit) - Humalog at 16+1 scale qac ( using the scale only 1-2 times daily)  -Victoza  1.2 mg daily ( start April 2016)   Last hemoglobin A1c was: Lab Results  Component Value Date   HGBA1C 12.7* 07/02/2014     Pt checks her sugars 3 a day . Uses GE glucometer. By sugar log they are:  PREMEAL Breakfast Lunch Dinner Bedtime Overall  Glucose range: 131-188 136-178 133-302 115-174   Mean/median:        POST-MEAL PC Breakfast PC Lunch PC Dinner  Glucose range:     Mean/median:       Hypoglycemia-  No lows. Lowest sugar was n/a; she has hypoglycemia awareness at 70. No recent symptoms.   Dietary habits- eats three times daily. Tries to limit carbs, sweetened beverages, sodas, desserts. BF-bowl of cereal, special K, Trop 50, hot tea Lunch-sandwich -ham and cheese on wheat bread, peaches fruit cup Supper- pork chop, carrots, french fries  Done much better with diet since past 2 weeks, and sees improvement in her numbers. Seen lifestyle center. Found it helpful.   Exercise- walks  3 times weekly  Weight -  Wt Readings from Last 3 Encounters:  08/26/14 162 lb 8 oz (73.71 kg)  08/20/14 161 lb (73.029 kg)  08/12/14 162 lb 12 oz (73.823 kg)    Diabetes Complications-   Nephropathy- Yes Stage 2/3  CKD, last BUN/creatinine- GFR 61-70 Lab Results  Component Value Date   BUN 20 07/02/2014   CREATININE 1.0 07/02/2014   Sees Dr Holley Raring for once a diagnosis of stage 3 CKD, now getting better  Retinopathy- No, Last DEE was in March 2016, has cataract and at risk for [place] Neuropathy- mild numbness and tingling in )her hands, feet. No known neuropathy.  Associated history - No CAD . Prior mini- stroke, 1988, affected left side of face, affecting hearing. No hypothyroidism. her last TSH was No results found for: TSH  Hyperlipidemia-  her last set of lipids were- Currently on zocor 40mg  daily. Tolerating well.   Lab Results  Component Value Date   CHOL 166 07/02/2014   HDL 36 07/02/2014   TRIG 488* 07/02/2014    Blood Pressure/HTN- Patient's blood pressure is well controlled today on current regimen that includes ACE-I (enalapril).   I have reviewed the patient's past medical history, medications and allergies.   Current Outpatient Prescriptions on File Prior to Visit  Medication Sig Dispense Refill  . allopurinol (ZYLOPRIM) 300 MG tablet Take 300 mg by mouth daily.    Marland Kitchen amLODipine (NORVASC) 5 MG tablet Take 5 mg by mouth at bedtime.     Marland Kitchen aspirin EC 81 MG tablet Take 81 mg by mouth daily.    . Calcium Carb-Cholecalciferol (CALCIUM 600 + D PO) Take 1 tablet by mouth  2 (two) times daily.    . dorzolamide-timolol (COSOPT) 22.3-6.8 MG/ML ophthalmic solution Place 1 drop into both eyes 2 (two) times daily.    . enalapril (VASOTEC) 2.5 MG tablet Take 2.5 mg by mouth daily.    . fluticasone (FLONASE) 50 MCG/ACT nasal spray Place 2 sprays into both nostrils daily.    . Insulin Detemir (LEVEMIR) 100 UNIT/ML Pen Inject 48 Units into the skin at bedtime.     . insulin lispro (HUMALOG) 100 UNIT/ML KiwkPen Inject 16 Units into the skin 3 (three) times daily.    . Liraglutide 18 MG/3ML SOPN Inject 1.2 mg into the skin daily.    Marland Kitchen loperamide (IMODIUM) 2 MG capsule  Take 4 mg by mouth as needed for diarrhea or loose stools.    . metoprolol succinate (TOPROL-XL) 25 MG 24 hr tablet Take 25 mg by mouth daily.    . ranitidine (ZANTAC) 150 MG tablet Take 150 mg by mouth 2 (two) times daily.    . sertraline (ZOLOFT) 100 MG tablet Take 100 mg by mouth daily.    . simvastatin (ZOCOR) 40 MG tablet Take 40 mg by mouth daily.    Marland Kitchen Specialty Vitamins Products (VITA-RX DIABETIC VITAMIN) CAPS Take 1 packet by mouth daily. Diabetic Vitamins - 6 or 7 in 1 pack     No current facility-administered medications on file prior to visit.   Allergies  Allergen Reactions  . Ibuprofen Other (See Comments)    "ringing in ears"  . Strawberry Swelling    Lips swell and rash over body    Review of Systems- [ x ]  Complains of    [  ]  denies [  ] Recent weight change [ x ]  Fatigue [  ] polydipsia [  ] polyuria [  ]  nocturia [  ]  vision difficulty [  ] chest pain [  ] shortness of breath [  ] leg swelling [  ] cough [  ] nausea/vomiting [  ] diarrhea [  ] constipation [  ] abdominal pain [  ]  tingling/numbness in extremities [  ]  concern with feet ( wounds/sores)   PE: BP 118/68 mmHg  Pulse 89  Resp 12  Ht 5' (1.524 m)  Wt 162 lb 8 oz (73.71 kg)  BMI 31.74 kg/m2  SpO2 95% Wt Readings from Last 3 Encounters:  08/26/14 162 lb 8 oz (73.71 kg)  08/20/14 161 lb (73.029 kg)  08/12/14 162 lb 12 oz (73.823 kg)   Exam: deferred  ASSESSMENT AND PLAN: Problem List Items Addressed This Visit      Cardiovascular and Mediastinum   HTN (hypertension)    Bp at target on current therapy that includes ACE-I.  Update urine MA at next few visits, when sugars are better.          Endocrine   Type 2 diabetes mellitus - Primary     Encouraged her to check her sugars 4 x daily.  Continue to maintain follow up with DM education.   Since lunch is usually small, if she starts to notice lows in the afternoon, asked her to use half scale for now for lunch time.    Patient  Instructions  Check sugars 4 x daily ( before each meal and at bedtime).  Record them in a log book and bring that/meter to next appointment.  Change levemir to 50 units daily. Continue current Victoza. Start using the Humalog chart like you should three times  daily before each meal.  As you start to notice low sugars <90, please give [place] a call for further adjustments.   Please come back for a follow-up appointment in 1 month.              Genitourinary   CKD (chronic kidney disease) stage 2, GFR 60-89 ml/min    Recent improvement in her CKD per patient- sees Dr Holley Raring.          Other   Hyperlipidemia    On statin. Tolerating well. Elevated Tg recently in setting of uncontrolled sugars.              - Return to clinic in 4 weeks with sugar log/meter. Explained that [place] am [place] of [place] and she has elected to follow up with my colleagues at [facility] location.   Dao Memmott [facility] 08/31/2014 9:11 AM

## 2014-08-31 NOTE — Assessment & Plan Note (Signed)
Bp at target on current therapy that includes ACE-I.  Update urine MA at next few visits, when sugars are better.

## 2014-08-31 NOTE — Assessment & Plan Note (Signed)
  Encouraged her to check her sugars 4 x daily.  Continue to maintain follow up with DM education.   Since lunch is usually small, if she starts to notice lows in the afternoon, asked her to use half scale for now for lunch time.    Patient Instructions  Check sugars 4 x daily ( before each meal and at bedtime).  Record them in a log book and bring that/meter to next appointment.  Change levemir to 50 units daily. Continue current Victoza. Start using the Humalog chart like you should three times daily before each meal.  As you start to notice low sugars <90, please give [place] a call for further adjustments.   Please come back for a follow-up appointment in 1 month.

## 2014-08-31 NOTE — Assessment & Plan Note (Signed)
Recent improvement in her CKD per patient- sees Dr Holley Raring.

## 2014-08-31 NOTE — Assessment & Plan Note (Signed)
On statin. Tolerating well. Elevated Tg recently in setting of uncontrolled sugars.

## 2014-09-10 ENCOUNTER — Other Ambulatory Visit: Payer: Self-pay | Admitting: Unknown Physician Specialty

## 2014-09-22 ENCOUNTER — Ambulatory Visit: Payer: No Typology Code available for payment source | Admitting: Endocrinology

## 2014-10-07 ENCOUNTER — Encounter: Payer: No Typology Code available for payment source | Attending: Endocrinology | Admitting: Dietician

## 2014-10-07 VITALS — Ht 60.0 in | Wt 164.3 lb

## 2014-10-07 DIAGNOSIS — E119 Type 2 diabetes mellitus without complications: Secondary | ICD-10-CM | POA: Diagnosis not present

## 2014-10-07 NOTE — Progress Notes (Signed)

## 2014-10-14 ENCOUNTER — Encounter: Payer: No Typology Code available for payment source | Admitting: *Deleted

## 2014-10-14 VITALS — Wt 163.3 lb

## 2014-10-14 DIAGNOSIS — E1122 Type 2 diabetes mellitus with diabetic chronic kidney disease: Secondary | ICD-10-CM

## 2014-10-14 DIAGNOSIS — E119 Type 2 diabetes mellitus without complications: Secondary | ICD-10-CM | POA: Diagnosis not present

## 2014-10-14 NOTE — Progress Notes (Signed)

## 2014-10-21 ENCOUNTER — Encounter: Payer: No Typology Code available for payment source | Attending: Endocrinology | Admitting: Dietician

## 2014-10-21 VITALS — Ht 60.0 in | Wt 162.4 lb

## 2014-10-21 DIAGNOSIS — E119 Type 2 diabetes mellitus without complications: Secondary | ICD-10-CM | POA: Diagnosis present

## 2014-10-21 DIAGNOSIS — E1122 Type 2 diabetes mellitus with diabetic chronic kidney disease: Secondary | ICD-10-CM

## 2014-10-21 NOTE — Progress Notes (Signed)

## 2014-10-26 ENCOUNTER — Encounter: Payer: Self-pay | Admitting: *Deleted

## 2014-11-04 DIAGNOSIS — F329 Major depressive disorder, single episode, unspecified: Secondary | ICD-10-CM

## 2014-11-04 DIAGNOSIS — J309 Allergic rhinitis, unspecified: Secondary | ICD-10-CM

## 2014-11-04 DIAGNOSIS — I129 Hypertensive chronic kidney disease with stage 1 through stage 4 chronic kidney disease, or unspecified chronic kidney disease: Secondary | ICD-10-CM | POA: Insufficient documentation

## 2014-11-04 DIAGNOSIS — F32A Depression, unspecified: Secondary | ICD-10-CM

## 2014-11-08 ENCOUNTER — Encounter: Payer: Self-pay | Admitting: Unknown Physician Specialty

## 2014-11-08 ENCOUNTER — Ambulatory Visit (INDEPENDENT_AMBULATORY_CARE_PROVIDER_SITE_OTHER): Payer: No Typology Code available for payment source | Admitting: Unknown Physician Specialty

## 2014-11-08 VITALS — BP 116/75 | HR 93 | Temp 98.5°F | Ht 59.5 in | Wt 162.4 lb

## 2014-11-08 DIAGNOSIS — I129 Hypertensive chronic kidney disease with stage 1 through stage 4 chronic kidney disease, or unspecified chronic kidney disease: Secondary | ICD-10-CM

## 2014-11-08 DIAGNOSIS — N184 Chronic kidney disease, stage 4 (severe): Secondary | ICD-10-CM

## 2014-11-08 DIAGNOSIS — E785 Hyperlipidemia, unspecified: Secondary | ICD-10-CM | POA: Diagnosis not present

## 2014-11-08 DIAGNOSIS — N181 Chronic kidney disease, stage 1: Secondary | ICD-10-CM | POA: Diagnosis not present

## 2014-11-08 DIAGNOSIS — N185 Chronic kidney disease, stage 5: Secondary | ICD-10-CM

## 2014-11-08 DIAGNOSIS — N189 Chronic kidney disease, unspecified: Secondary | ICD-10-CM | POA: Diagnosis not present

## 2014-11-08 DIAGNOSIS — N183 Chronic kidney disease, stage 3 (moderate): Secondary | ICD-10-CM

## 2014-11-08 DIAGNOSIS — E1122 Type 2 diabetes mellitus with diabetic chronic kidney disease: Secondary | ICD-10-CM | POA: Diagnosis not present

## 2014-11-08 DIAGNOSIS — N182 Chronic kidney disease, stage 2 (mild): Secondary | ICD-10-CM

## 2014-11-08 LAB — BAYER DCA HB A1C WAIVED: HB A1C: 7 % — AB (ref <7.0)

## 2014-11-08 LAB — MICROALBUMIN, URINE WAIVED
CREATININE, URINE WAIVED: 100 mg/dL (ref 10–300)
MICROALB, UR WAIVED: 80 mg/L — AB (ref 0–19)
Microalb/Creat Ratio: <30 mg/g (ref <30)

## 2014-11-08 MED ORDER — LIRAGLUTIDE 18 MG/3ML ~~LOC~~ SOPN: 1.2000 mg | PEN_INJECTOR | Freq: Every day | SUBCUTANEOUS | Status: DC

## 2014-11-08 MED ORDER — METOPROLOL SUCCINATE ER 25 MG PO TB24: 25.0000 mg | ORAL_TABLET | Freq: Every day | ORAL | Status: DC

## 2014-11-08 MED ORDER — AMLODIPINE BESYLATE 5 MG PO TABS: 5.0000 mg | ORAL_TABLET | Freq: Every day | ORAL | Status: DC

## 2014-11-08 MED ORDER — SIMVASTATIN 40 MG PO TABS: 40.0000 mg | ORAL_TABLET | Freq: Every day | ORAL | Status: DC

## 2014-11-08 MED ORDER — ENALAPRIL MALEATE 2.5 MG PO TABS: 2.5000 mg | ORAL_TABLET | Freq: Every day | ORAL | Status: DC

## 2014-11-08 NOTE — Progress Notes (Signed)
BP 116/75 mmHg  Pulse 93  Temp(Src) 98.5 F (36.9 C)  Ht 4' 11.5" (1.511 m)  Wt 162 lb 6.4 oz (73.664 kg)  BMI 32.26 kg/m2  SpO2 96%  LMP  (LMP Unknown)   Subjective:    Patient ID: Kara Leach Parents, female    DOB: 06-22-1950, 64 y.o.   MRN: [id number]  HPI: Kara Leach is a 64 y.o. female  Chief Complaint  Patient presents with  . Diabetes  . Hyperlipidemia  . Hypertension   Diabetes She presents for her follow-up (Was seeing Endocrine but moved to [facility].  finds she is doing well with the addition of Victoza.  ) diabetic visit. She has type 2 diabetes mellitus. Onset time: Since 2002. Disease course: Unstable until recentlu. There are no hypoglycemic associated symptoms. Associated symptoms include foot paresthesias. Pertinent negatives for diabetes include no blurred vision, no chest pain, no fatigue, no polydipsia, no visual change and no weakness. There are no hypoglycemic complications. Symptoms are improving. There are no diabetic complications. Current diabetic treatments: Takes 1.2 of Victoza in addition to insulin. She is compliant with treatment all of the time. Insulin dose schedule: Takes 16 u Humalog with meals and 18 if sugar is above 150.  Takes 50 u Levimir QHS. Her weight is stable. She is following a diabetic diet. When asked about meal planning, she reported none. She has had a previous visit with a dietitian. She participates in exercise daily. She monitors blood glucose at home 3-4 x per day. Her breakfast blood glucose range is generally 110-130 mg/dl. Her lunch blood glucose range is generally 130-140 mg/dl. Her dinner blood glucose range is generally 140-180 mg/dl. She does not see a podiatrist.Eye exam is current.  Hyperlipidemia This is a chronic problem. Pertinent negatives include no chest pain, focal weakness, myalgias or shortness of breath. Current antihyperlipidemic treatment includes statins. The current treatment provides moderate improvement of lipids.   Hypertension This is a chronic problem. The problem is controlled. Pertinent negatives include no blurred vision, chest pain, orthopnea, palpitations, peripheral edema or shortness of breath. The current treatment provides significant improvement. There are no compliance problems.     Relevant past medical, surgical, family and social history reviewed and updated as indicated. Interim medical history since our last visit reviewed. Allergies and medications reviewed and updated.  Review of Systems  Constitutional: Negative for fatigue.  Eyes: Negative for blurred vision.  Respiratory: Negative for shortness of breath.   Cardiovascular: Negative for chest pain, palpitations and orthopnea.  Endocrine: Negative for polydipsia.  Musculoskeletal: Negative for myalgias.  Neurological: Negative for focal weakness and weakness.    Per HPI unless specifically indicated above     Objective:    BP 116/75 mmHg  Pulse 93  Temp(Src) 98.5 F (36.9 C)  Ht 4' 11.5" (1.511 m)  Wt 162 lb 6.4 oz (73.664 kg)  BMI 32.26 kg/m2  SpO2 96%  LMP  (LMP Unknown)  Wt Readings from Last 3 Encounters:  11/08/14 162 lb 6.4 oz (73.664 kg)  08/06/14 166 lb (75.297 kg)  10/21/14 162 lb 6.4 oz (73.664 kg)    Physical Exam  Constitutional: She is oriented to person, place, and time. She appears well-developed and well-nourished. No distress.  HENT:  Head: Normocephalic and atraumatic.  Eyes: Conjunctivae and lids are normal. Right eye exhibits no discharge. Left eye exhibits no discharge. No scleral icterus.  Cardiovascular: Normal rate, regular rhythm and normal heart sounds.   Pulmonary/Chest: Effort normal and  breath sounds normal. No respiratory distress.  Abdominal: Normal appearance. There is no splenomegaly or hepatomegaly.  Musculoskeletal: Normal range of motion.  Neurological: She is alert and oriented to person, place, and time.  Skin: Skin is warm, dry and intact. No rash noted. No pallor.   Psychiatric: She has a normal mood and affect. Her behavior is normal. Judgment and thought content normal.     Assessment & Plan:   Problem List Items Addressed This Visit      Unprioritized   Hyperlipidemia   Relevant Medications   simvastatin (ZOCOR) 40 MG tablet   enalapril (VASOTEC) 2.5 MG tablet   amLODipine (NORVASC) 5 MG tablet   metoprolol succinate (TOPROL-XL) 25 MG 24 hr tablet   Other Relevant Orders   Lipid Panel Piccolo, Waived   CKD (chronic kidney disease) stage 2, GFR 60-89 ml/min - Primary    Microalbumin is 80.  Seeing nephrology      Relevant Orders   Microalbumin, Urine Waived   Comprehensive metabolic panel   Hypertensive CKD (chronic kidney disease)    Stable, continue present medications.       Relevant Orders   Microalbumin, Urine Waived   Uric acid   Comprehensive metabolic panel   Diabetes mellitus with chronic kidney disease    Hgb A1C is 7 and is at goal      Relevant Medications   simvastatin (ZOCOR) 40 MG tablet   Liraglutide 18 MG/3ML SOPN   enalapril (VASOTEC) 2.5 MG tablet   Other Relevant Orders   Bayer DCA Hb A1c Waived   Microalbumin, Urine Waived   Comprehensive metabolic panel       Follow up plan: Return in about 3 months (around 02/08/2015).

## 2014-11-08 NOTE — Assessment & Plan Note (Signed)
Microalbumin is 80.  Seeing nephrology

## 2014-11-08 NOTE — Assessment & Plan Note (Signed)
Hgb A1C is 7 and is at goal

## 2014-11-08 NOTE — Assessment & Plan Note (Signed)
Stable, continue present medications.   

## 2014-11-09 LAB — URIC ACID: Uric Acid: 4 mg/dL (ref 2.5–7.1)

## 2014-11-09 LAB — COMPREHENSIVE METABOLIC PANEL
ALBUMIN: 4.5 g/dL (ref 3.6–4.8)
ALT: 25 IU/L (ref 0–32)
AST: 44 IU/L — ABNORMAL HIGH (ref 0–40)
Albumin/Globulin Ratio: 1.6 (ref 1.1–2.5)
Alkaline Phosphatase: 113 IU/L (ref 39–117)
BILIRUBIN TOTAL: 0.3 mg/dL (ref 0.0–1.2)
BUN / CREAT RATIO: 18 (ref 11–26)
BUN: 19 mg/dL (ref 8–27)
CHLORIDE: 99 mmol/L (ref 97–108)
CO2: 21 mmol/L (ref 18–29)
Calcium: 9.5 mg/dL (ref 8.7–10.3)
Creatinine, Ser: 1.05 mg/dL — ABNORMAL HIGH (ref 0.57–1.00)
GFR calc Af Amer: 66 mL/min/{1.73_m2} (ref >59)
GFR calc non Af Amer: 57 mL/min/{1.73_m2} — ABNORMAL LOW (ref >59)
GLOBULIN, TOTAL: 2.8 g/dL (ref 1.5–4.5)
Glucose: 122 mg/dL — ABNORMAL HIGH (ref 65–99)
Potassium: 4.6 mmol/L (ref 3.5–5.2)
Sodium: 139 mmol/L (ref 134–144)
Total Protein: 7.3 g/dL (ref 6.0–8.5)

## 2014-11-30 ENCOUNTER — Other Ambulatory Visit: Payer: Self-pay | Admitting: Unknown Physician Specialty

## 2014-11-30 NOTE — Telephone Encounter (Signed)
Last endo note and last PCP notes reviewed approved

## 2014-12-16 ENCOUNTER — Other Ambulatory Visit: Payer: Self-pay | Admitting: Unknown Physician Specialty

## 2014-12-21 ENCOUNTER — Other Ambulatory Visit: Payer: Self-pay | Admitting: *Deleted

## 2014-12-21 DIAGNOSIS — D472 Monoclonal gammopathy: Secondary | ICD-10-CM

## 2014-12-22 ENCOUNTER — Inpatient Hospital Stay: Payer: No Typology Code available for payment source | Attending: Oncology | Admitting: *Deleted

## 2014-12-22 ENCOUNTER — Telehealth: Payer: Self-pay

## 2014-12-22 DIAGNOSIS — F329 Major depressive disorder, single episode, unspecified: Secondary | ICD-10-CM | POA: Insufficient documentation

## 2014-12-22 DIAGNOSIS — E1165 Type 2 diabetes mellitus with hyperglycemia: Secondary | ICD-10-CM | POA: Diagnosis not present

## 2014-12-22 DIAGNOSIS — E669 Obesity, unspecified: Secondary | ICD-10-CM | POA: Diagnosis not present

## 2014-12-22 DIAGNOSIS — D472 Monoclonal gammopathy: Secondary | ICD-10-CM | POA: Insufficient documentation

## 2014-12-22 DIAGNOSIS — Z79899 Other long term (current) drug therapy: Secondary | ICD-10-CM | POA: Insufficient documentation

## 2014-12-22 DIAGNOSIS — K219 Gastro-esophageal reflux disease without esophagitis: Secondary | ICD-10-CM | POA: Insufficient documentation

## 2014-12-22 DIAGNOSIS — I129 Hypertensive chronic kidney disease with stage 1 through stage 4 chronic kidney disease, or unspecified chronic kidney disease: Secondary | ICD-10-CM | POA: Diagnosis not present

## 2014-12-22 DIAGNOSIS — Z7982 Long term (current) use of aspirin: Secondary | ICD-10-CM | POA: Diagnosis not present

## 2014-12-22 DIAGNOSIS — E785 Hyperlipidemia, unspecified: Secondary | ICD-10-CM | POA: Diagnosis not present

## 2014-12-22 DIAGNOSIS — Z794 Long term (current) use of insulin: Secondary | ICD-10-CM | POA: Insufficient documentation

## 2014-12-22 LAB — CBC WITH DIFFERENTIAL/PLATELET
BASOS ABS: 0.1 10*3/uL (ref 0–0.1)
Basophils Relative: 1 %
EOS PCT: 4 %
Eosinophils Absolute: 0.3 10*3/uL (ref 0–0.7)
HCT: 39.3 % (ref 35.0–47.0)
Hemoglobin: 13.1 g/dL (ref 12.0–16.0)
Lymphocytes Relative: 38 %
Lymphs Abs: 3.2 10*3/uL (ref 1.0–3.6)
MCH: 31.1 pg (ref 26.0–34.0)
MCHC: 33.4 g/dL (ref 32.0–36.0)
MCV: 92.9 fL (ref 80.0–100.0)
Monocytes Absolute: 0.6 10*3/uL (ref 0.2–0.9)
Monocytes Relative: 7 %
Neutro Abs: 4.2 10*3/uL (ref 1.4–6.5)
Neutrophils Relative %: 50 %
PLATELETS: 296 10*3/uL (ref 150–440)
RBC: 4.22 MIL/uL (ref 3.80–5.20)
RDW: 14.2 % (ref 11.5–14.5)
WBC: 8.3 10*3/uL (ref 3.6–11.0)

## 2014-12-22 LAB — COMPREHENSIVE METABOLIC PANEL
ALT: 23 U/L (ref 14–54)
AST: 40 U/L (ref 15–41)
Albumin: 4.2 g/dL (ref 3.5–5.0)
Alkaline Phosphatase: 106 U/L (ref 38–126)
Anion gap: 7 (ref 5–15)
BUN: 20 mg/dL (ref 6–20)
CHLORIDE: 105 mmol/L (ref 101–111)
CO2: 24 mmol/L (ref 22–32)
Calcium: 8.7 mg/dL — ABNORMAL LOW (ref 8.9–10.3)
Creatinine, Ser: 1.1 mg/dL — ABNORMAL HIGH (ref 0.44–1.00)
GFR calc non Af Amer: 53 mL/min — ABNORMAL LOW (ref >60)
Glucose, Bld: 172 mg/dL — ABNORMAL HIGH (ref 65–99)
POTASSIUM: 4.4 mmol/L (ref 3.5–5.1)
Sodium: 136 mmol/L (ref 135–145)
Total Bilirubin: 0.4 mg/dL (ref 0.3–1.2)
Total Protein: 8.4 g/dL — ABNORMAL HIGH (ref 6.5–8.1)

## 2014-12-22 NOTE — Telephone Encounter (Signed)
Received a call from the lab that the urine specimen collected was not enough for the protein electrophoresis that was ordered.  Test re entered for collection at next appt on 12/29/14.

## 2014-12-23 LAB — PROTEIN ELECTROPHORESIS, SERUM
A/G Ratio: 1 (ref 0.7–1.7)
Albumin ELP: 3.6 g/dL (ref 2.9–4.4)
Alpha-1-Globulin: 0.3 g/dL (ref 0.0–0.4)
Alpha-2-Globulin: 1 g/dL (ref 0.4–1.0)
Beta Globulin: 1.1 g/dL (ref 0.7–1.3)
GAMMA GLOBULIN: 1.2 g/dL (ref 0.4–1.8)
Globulin, Total: 3.6 g/dL (ref 2.2–3.9)
M-SPIKE, %: 0.6 g/dL — AB
TOTAL PROTEIN ELP: 7.2 g/dL (ref 6.0–8.5)

## 2014-12-23 LAB — KAPPA/LAMBDA LIGHT CHAINS
KAPPA FREE LGHT CHN: 34.83 mg/L — AB (ref 3.30–19.40)
KAPPA, LAMDA LIGHT CHAIN RATIO: 1.61 (ref 0.26–1.65)
Lambda free light chains: 21.59 mg/L (ref 5.71–26.30)

## 2014-12-29 ENCOUNTER — Inpatient Hospital Stay: Payer: No Typology Code available for payment source

## 2014-12-29 ENCOUNTER — Other Ambulatory Visit: Payer: Self-pay | Admitting: Family Medicine

## 2014-12-29 ENCOUNTER — Inpatient Hospital Stay (HOSPITAL_BASED_OUTPATIENT_CLINIC_OR_DEPARTMENT_OTHER): Payer: No Typology Code available for payment source | Admitting: Oncology

## 2014-12-29 ENCOUNTER — Other Ambulatory Visit: Payer: Self-pay | Admitting: Unknown Physician Specialty

## 2014-12-29 VITALS — BP 122/76 | HR 92 | Temp 97.7°F | Resp 16 | Wt 163.4 lb

## 2014-12-29 DIAGNOSIS — D472 Monoclonal gammopathy: Secondary | ICD-10-CM

## 2014-12-29 DIAGNOSIS — I129 Hypertensive chronic kidney disease with stage 1 through stage 4 chronic kidney disease, or unspecified chronic kidney disease: Secondary | ICD-10-CM | POA: Diagnosis not present

## 2014-12-29 DIAGNOSIS — Z79899 Other long term (current) drug therapy: Secondary | ICD-10-CM

## 2014-12-29 DIAGNOSIS — E1165 Type 2 diabetes mellitus with hyperglycemia: Secondary | ICD-10-CM

## 2014-12-29 NOTE — Progress Notes (Signed)
[place]  Telephone:(336) [phone] Fax:([phone]  ID: SHERETA CROTHERS OB: 1950-09-02  MR#: [id number]  ION#:[id number]  Patient Care Team: Guadalupe Maple, MD as PCP - General (Family Medicine)  CHIEF COMPLAINT: MGUS  INTERVAL HISTORY: Patient returns to clinic today for repeat laboratory work and further evaluation. She continues to feel well and remains asymptomatic. Her diabetes is better controlled. She denies any recent fevers or illnesses. She has a good appetite and denies weight loss.  She has a mild peripheral neuropathy, but no other neurologic complaints.  She denies any bony pain.  She has no chest pain or shortness of breath.  She denies any nausea, vomiting, constipation, or diarrhea.  She has no urinary complaints.  Patient offers no specific complaints today.  REVIEW OF SYSTEMS:   Review of Systems  Constitutional: Negative.   Respiratory: Negative.   Cardiovascular: Negative.   Gastrointestinal: Negative.   Genitourinary: Negative.   Musculoskeletal: Negative.   Neurological: Positive for sensory change.    As per HPI. Otherwise, a complete review of systems is negatve.  PAST MEDICAL HISTORY: Past Medical History  Diagnosis Date  . Diabetes mellitus without complication   . Hyperlipidemia   . Hypertension   . Depression   . Chronic kidney disease   . Arthritis     Osteoarthritis  . GERD (gastroesophageal reflux disease)   . Allergy   . Obesity     PAST SURGICAL HISTORY: Past Surgical History  Procedure Laterality Date  . Appendectomy    . Abdominal hysterectomy    . Wrist fracture surgery Left     Steel plate  . Patella fracture surgery Left     FAMILY HISTORY Family History  Problem Relation Age of Onset  . Diabetes Mother   . Heart disease Mother   . Hypertension Mother   . Diabetes Sister   . Cancer Father     stomach  . Arthritis Sister   . Stroke Sister   . Hypertension Sister        ADVANCED  DIRECTIVES:    HEALTH MAINTENANCE: Social History  Substance Use Topics  . Smoking status: Never Smoker   . Smokeless tobacco: Never Used  . Alcohol Use: 0.0 oz/week    0 Standard drinks or equivalent per week     Comment: occasionally holiday, weddings     Colonoscopy:  PAP:  Bone density:  Lipid panel:  Allergies  Allergen Reactions  . Ibuprofen Other (See Comments)    "ringing in ears"  . Strawberry Swelling    Lips swell and rash over body    Current Outpatient Prescriptions  Medication Sig Dispense Refill  . allopurinol (ZYLOPRIM) 300 MG tablet TAKE ONE TABLET BY MOUTH EVERY DAY 30 tablet 11  . amLODipine (NORVASC) 5 MG tablet TAKE ONE TABLET BY MOUTH EVERY DAY 30 tablet 2  . aspirin EC 81 MG tablet Take 81 mg by mouth daily.    . Calcium Carb-Cholecalciferol (CALCIUM 600 + D PO) Take 1 tablet by mouth 2 (two) times daily.    . dorzolamide-timolol (COSOPT) 22.3-6.8 MG/ML ophthalmic solution Place 1 drop into both eyes 2 (two) times daily.    . enalapril (VASOTEC) 2.5 MG tablet Take 1 tablet (2.5 mg total) by mouth daily. 90 tablet 1  . ferrous fumarate (HEMOCYTE - 106 MG FE) 325 (106 FE) MG TABS tablet Take 1 tablet by mouth daily.    . fluticasone (FLONASE) 50 MCG/ACT nasal spray Place 2 sprays into both  nostrils daily.    Marland Kitchen glucose blood test strip 1 each by Other route 3 (three) times daily. Use as instructed    . HUMALOG KWIKPEN 100 UNIT/ML KiwkPen INJECT SOLUTION PEN-INJECTOR UNDER THE SKIN EVERY 6 HOURS BEFORE MEALS 4 pen 12  . Insulin Detemir (LEVEMIR) 100 UNIT/ML Pen Inject 48 Units into the skin at bedtime.     . Insulin Pen Needle 32G X 6 MM MISC by Does not apply route 3 (three) times daily.    Marland Kitchen loperamide (IMODIUM) 2 MG capsule Take 4 mg by mouth as needed for diarrhea or loose stools.    . metoprolol succinate (TOPROL-XL) 25 MG 24 hr tablet TAKE ONE TABLET BY MOUTH EVERY DAY 30 tablet 10  . ranitidine (ZANTAC) 150 MG tablet Take 150 mg by mouth 2 (two)  times daily.    . sertraline (ZOLOFT) 100 MG tablet Take 100 mg by mouth daily.    . simvastatin (ZOCOR) 40 MG tablet Take 1 tablet (40 mg total) by mouth daily. 90 tablet 3  . Specialty Vitamins Products (VITA-RX DIABETIC VITAMIN) CAPS Take 1 packet by mouth daily. Diabetic Vitamins - 6 or 7 in 1 pack    . VICTOZA 18 MG/3ML SOPN INJECT 1.2MG ONCE A DAY SUBCUTANEOUS 9 mL 0   No current facility-administered medications for this visit.    OBJECTIVE: There were no vitals filed for this visit.   There is no weight on file to calculate BMI.    ECOG FS:0 - Asymptomatic  General: Well-developed, well-nourished, no acute distress. Eyes: Pink conjunctiva, anicteric sclera. Lungs: Clear to auscultation bilaterally. Heart: Regular rate and rhythm. No rubs, murmurs, or gallops. Abdomen: Soft, nontender, nondistended. No organomegaly noted, normoactive bowel sounds. Musculoskeletal: No edema, cyanosis, or clubbing. Neuro: Alert, answering all questions appropriately. Cranial nerves grossly intact. Skin: No rashes or petechiae noted. Psych: Normal affect.   LAB RESULTS:  Lab Results  Component Value Date   NA 136 12/22/2014   K 4.4 12/22/2014   CL 105 12/22/2014   CO2 24 12/22/2014   GLUCOSE 172* 12/22/2014   BUN 20 12/22/2014   CREATININE 1.10* 12/22/2014   CALCIUM 8.7* 12/22/2014   PROT 8.4* 12/22/2014   ALBUMIN 4.2 12/22/2014   AST 40 12/22/2014   ALT 23 12/22/2014   ALKPHOS 106 12/22/2014   BILITOT 0.4 12/22/2014   GFRNONAA 53* 12/22/2014   GFRAA >60 12/22/2014    Lab Results  Component Value Date   WBC 8.3 12/22/2014   NEUTROABS 4.2 12/22/2014   HGB 13.1 12/22/2014   HCT 39.3 12/22/2014   MCV 92.9 12/22/2014   PLT 296 12/22/2014     STUDIES: No results found.  ASSESSMENT: MGUS  PLAN:    1.  MGUS: Patient's most recent M spike is unchanged at 0.6.  She has no evidence of endorgan damage and her kidney function is improved.  Previously,  metastatic bone survey was  reported as negative.  This does not need to be repeated unless there is concern of progression of disease.  Patient does not require bone marrow biopsy at this time.  She will return to clinic in 6 months for repeat laboratory work and routine evaluation.   2.  Chronic renal insufficiency: Treatment per Dr. Holley Raring. 3.  Hyperglycemia:  Blood sugars are better controlled.  Continue current diabetic medications. Treatment per PCP. 4.  Peripheral neuropathy: Secondary to diabetes. Monitor.   Patient expressed understanding and was in agreement with this plan. She also understands that She  can call clinic at any time with any questions, concerns, or complaints.   Lloyd Huger, MD   12/29/2014 11:30 AM

## 2014-12-29 NOTE — Addendum Note (Signed)
Addended by: Vanice Sarah on: 12/29/2014 11:46 AM   Modules accepted: Medications

## 2014-12-29 NOTE — Telephone Encounter (Signed)
Patient saw Kathrine Haddock on 11/08/14; forwarding Rx request

## 2014-12-31 LAB — PROTEIN ELECTRO, RANDOM URINE
ALPHA-2-GLOBULIN, U: 5.1 %
Albumin ELP, Urine: 63.8 %
Alpha-1-Globulin, U: 1.9 %
Beta Globulin, U: 22.1 %
Gamma Globulin, U: 7.1 %
TOTAL PROTEIN, URINE-UPE24: 27.9 mg/dL

## 2015-01-26 ENCOUNTER — Other Ambulatory Visit: Payer: Self-pay | Admitting: Unknown Physician Specialty

## 2015-02-08 ENCOUNTER — Ambulatory Visit (INDEPENDENT_AMBULATORY_CARE_PROVIDER_SITE_OTHER): Payer: Medicare Other | Admitting: Unknown Physician Specialty

## 2015-02-08 ENCOUNTER — Encounter: Payer: Self-pay | Admitting: Unknown Physician Specialty

## 2015-02-08 VITALS — BP 127/84 | HR 98 | Temp 97.6°F | Ht 59.3 in | Wt 163.2 lb

## 2015-02-08 DIAGNOSIS — E1122 Type 2 diabetes mellitus with diabetic chronic kidney disease: Secondary | ICD-10-CM | POA: Diagnosis not present

## 2015-02-08 DIAGNOSIS — Z794 Long term (current) use of insulin: Secondary | ICD-10-CM

## 2015-02-08 DIAGNOSIS — I129 Hypertensive chronic kidney disease with stage 1 through stage 4 chronic kidney disease, or unspecified chronic kidney disease: Secondary | ICD-10-CM | POA: Diagnosis not present

## 2015-02-08 LAB — BAYER DCA HB A1C WAIVED: HB A1C: 7 % — AB (ref <7.0)

## 2015-02-08 NOTE — Assessment & Plan Note (Signed)
Hgb A1C is 7.  Continue present

## 2015-02-08 NOTE — Progress Notes (Signed)
BP 127/84 mmHg  Pulse 98  Temp(Src) 97.6 F (36.4 C)  Ht 4' 11.3" (1.506 m)  Wt 163 lb 3.2 oz (74.027 kg)  BMI 32.64 kg/m2  SpO2 95%  LMP  (LMP Unknown)   Subjective:    Patient ID: Kara Leach, female    DOB: [birth year], 64 y.o.   MRN: [id number]  HPI: Kara Leach is a 64 y.o. female  Chief Complaint  Patient presents with  . Diabetes  . Hyperlipidemia  . Hypertension   Diabetes:  Using medications without difficulties No hypoglycemic episodes No hyperglycemic episodes Feet problems: none Blood Sugars averaging: checks twice a day.  Average BS around 142  eye exam within last year yes Takes 16 u Humalog at meals and 50 u Levamir at night.   1.2 Victoza in the evening.    Hypertension:  Using medications without difficulty Average home BPs not checking   Using medication without problems or lightheadedness No chest pain with exertion or shortness of breath No Edema  Elevated Cholesterol: Using medications without problems: No Muscle aches Diet compliance: Low sugar and low fat and tries to avoid carbs Exercise: low impact     Relevant past medical, surgical, family and social history reviewed and updated as indicated. Interim medical history since our last visit reviewed. Allergies and medications reviewed and updated.  Review of Systems  Per HPI unless specifically indicated above     Objective:    BP 127/84 mmHg  Pulse 98  Temp(Src) 97.6 F (36.4 C)  Ht 4' 11.3" (1.506 m)  Wt 163 lb 3.2 oz (74.027 kg)  BMI 32.64 kg/m2  SpO2 95%  LMP  (LMP Unknown)  Wt Readings from Last 3 Encounters:  02/08/15 163 lb 3.2 oz (74.027 kg)  12/29/14 163 lb 5.8 oz (74.1 kg)  11/08/14 162 lb 6.4 oz (73.664 kg)    Physical Exam  Constitutional: She is oriented to person, place, and time. She appears well-developed and well-nourished. No distress.  HENT:  Head: Normocephalic and atraumatic.  Eyes: Conjunctivae and lids are normal. Right eye exhibits  no discharge. Left eye exhibits no discharge. No scleral icterus.  Cardiovascular: Normal rate, regular rhythm and normal heart sounds.   Pulmonary/Chest: Effort normal and breath sounds normal. No respiratory distress.  Abdominal: Normal appearance. There is no splenomegaly or hepatomegaly.  Musculoskeletal: Normal range of motion.  Neurological: She is alert and oriented to person, place, and time.  Skin: Skin is intact. No rash noted. No pallor.  Psychiatric: She has a normal mood and affect. Her behavior is normal. Judgment and thought content normal.    Results for orders placed or performed in visit on 12/29/14  Protein Electro, Random Urine  Result Value Ref Range   Total Protein, Urine 27.9 Not Estab. mg/dL   Albumin ELP, Urine 63.8 %   Alpha-1-Globulin, U 1.9 %   Alpha-2-Globulin, U 5.1 %   Beta Globulin, U 22.1 %   Gamma Globulin, U 7.1 %   M Component, Ur Not Observed Not Observed %   PLEASE NOTE: Comment       Assessment & Plan:   Problem List Items Addressed This Visit      Unprioritized   Hypertensive CKD (chronic kidney disease)    Stable, continue present medications.       Diabetes mellitus with chronic kidney disease (HCC) - Primary    Hgb A1C is 7.  Continue present      Relevant Orders   [employer]  DCA Hb A1c Waived      Samples of Tresiba given until she can get Levamiar-  Follow up plan: Return in about 3 months (around 05/11/2015).

## 2015-02-08 NOTE — Assessment & Plan Note (Signed)
Stable, continue present medications.   

## 2015-02-17 ENCOUNTER — Other Ambulatory Visit: Payer: Self-pay | Admitting: Unknown Physician Specialty

## 2015-03-15 ENCOUNTER — Telehealth: Payer: Self-pay | Admitting: Unknown Physician Specialty

## 2015-03-15 NOTE — Telephone Encounter (Signed)
Is it okay to give these samples? We have both available.

## 2015-03-15 NOTE — Telephone Encounter (Signed)
That's fine

## 2015-03-15 NOTE — Telephone Encounter (Signed)
Called and let patient know that she could pick up samples.

## 2015-03-15 NOTE — Telephone Encounter (Signed)
Pt would like a sample of humalog and victoza. Due to insurance she cant get meds but she will be able to get it jan 1. thanks

## 2015-03-20 DIAGNOSIS — K5792 Diverticulitis of intestine, part unspecified, without perforation or abscess without bleeding: Secondary | ICD-10-CM

## 2015-03-20 HISTORY — DX: Diverticulitis of intestine, part unspecified, without perforation or abscess without bleeding: K57.92

## 2015-03-21 ENCOUNTER — Other Ambulatory Visit: Payer: Self-pay | Admitting: Unknown Physician Specialty

## 2015-03-29 ENCOUNTER — Other Ambulatory Visit: Payer: Self-pay | Admitting: Unknown Physician Specialty

## 2015-03-29 MED ORDER — LIRAGLUTIDE 18 MG/3ML ~~LOC~~ SOPN: 1.8000 mg | PEN_INJECTOR | Freq: Every morning | SUBCUTANEOUS | Status: DC

## 2015-03-29 MED ORDER — INSULIN DETEMIR 100 UNIT/ML FLEXPEN: 60.0000 [IU] | PEN_INJECTOR | Freq: Every day | SUBCUTANEOUS | Status: DC

## 2015-03-29 MED ORDER — GLUCOSE BLOOD VI STRP: 1.0000 | ORAL_STRIP | Freq: Three times a day (TID) | Status: DC

## 2015-03-29 NOTE — Telephone Encounter (Signed)
Routing to provider. Patient was last seen 02/08/15 and has appointment 05/11/15.

## 2015-03-29 NOTE — Telephone Encounter (Signed)
Pt would like a refill on victoza, test strips and levamir flex touch    Sent to [facility] [address]

## 2015-04-06 DIAGNOSIS — R69 Illness, unspecified: Secondary | ICD-10-CM | POA: Diagnosis not present

## 2015-04-13 ENCOUNTER — Other Ambulatory Visit: Payer: Self-pay | Admitting: Unknown Physician Specialty

## 2015-04-18 ENCOUNTER — Other Ambulatory Visit: Payer: Self-pay | Admitting: Unknown Physician Specialty

## 2015-04-19 ENCOUNTER — Telehealth: Payer: Self-pay | Admitting: Unknown Physician Specialty

## 2015-04-19 MED ORDER — INSULIN ASPART 100 UNIT/ML FLEXPEN: 15.0000 [IU] | PEN_INJECTOR | Freq: Three times a day (TID) | SUBCUTANEOUS | Status: DC

## 2015-04-19 NOTE — Telephone Encounter (Signed)
Routing to provider  

## 2015-04-19 NOTE — Telephone Encounter (Signed)
Pt called insurance wants to change RX from Humalog to Novalog. [facility] [address] in [place]. Thanks.

## 2015-05-04 DIAGNOSIS — H40063 Primary angle closure without glaucoma damage, bilateral: Secondary | ICD-10-CM | POA: Diagnosis not present

## 2015-05-11 ENCOUNTER — Ambulatory Visit (INDEPENDENT_AMBULATORY_CARE_PROVIDER_SITE_OTHER): Payer: Medicare HMO | Admitting: Unknown Physician Specialty

## 2015-05-11 ENCOUNTER — Encounter: Payer: Self-pay | Admitting: Unknown Physician Specialty

## 2015-05-11 VITALS — BP 132/82 | HR 103 | Temp 97.7°F | Ht 58.3 in | Wt 162.2 lb

## 2015-05-11 DIAGNOSIS — E785 Hyperlipidemia, unspecified: Secondary | ICD-10-CM

## 2015-05-11 DIAGNOSIS — N182 Chronic kidney disease, stage 2 (mild): Secondary | ICD-10-CM

## 2015-05-11 DIAGNOSIS — I129 Hypertensive chronic kidney disease with stage 1 through stage 4 chronic kidney disease, or unspecified chronic kidney disease: Secondary | ICD-10-CM | POA: Diagnosis not present

## 2015-05-11 DIAGNOSIS — Z794 Long term (current) use of insulin: Secondary | ICD-10-CM

## 2015-05-11 DIAGNOSIS — E0822 Diabetes mellitus due to underlying condition with diabetic chronic kidney disease: Secondary | ICD-10-CM | POA: Diagnosis not present

## 2015-05-11 DIAGNOSIS — E119 Type 2 diabetes mellitus without complications: Secondary | ICD-10-CM | POA: Diagnosis not present

## 2015-05-11 DIAGNOSIS — H40003 Preglaucoma, unspecified, bilateral: Secondary | ICD-10-CM | POA: Diagnosis not present

## 2015-05-11 LAB — LIPID PANEL PICCOLO, WAIVED

## 2015-05-11 LAB — HM DIABETES EYE EXAM

## 2015-05-11 NOTE — Assessment & Plan Note (Signed)
Lipemic sample.  Wait for results

## 2015-05-11 NOTE — Assessment & Plan Note (Signed)
Stable, continue present medications.   

## 2015-05-11 NOTE — Assessment & Plan Note (Deleted)
Hgb A1C is 7.5.  Recommended increasing night time insulin

## 2015-05-11 NOTE — Assessment & Plan Note (Addendum)
Hgb A1C is 7.5.  Recommended increasing night time insulin.  Written titration given

## 2015-05-11 NOTE — Patient Instructions (Signed)
Base your long acting insulin (Levemir) on your fasting (usually in the morning) blood sugar.  Increase long acting (daily insulin) 2 units if fasting blood sugar is greater than 120.  Decrease by 2 units if fasting blood sugar is less than 95.

## 2015-05-11 NOTE — Progress Notes (Signed)
BP 132/82 mmHg  Pulse 103  Temp(Src) 97.7 F (36.5 C)  Ht 4' 10.3" (1.481 m)  Wt 162 lb 3.2 oz (73.573 kg)  BMI 33.54 kg/m2  SpO2 96%  LMP  (LMP Unknown)   Subjective:    Patient ID: Kara Leach Parents, female    DOB: [birth year], 65 y.o.   MRN: [id number]  HPI: Kara Leach is a 65 y.o. female  Chief Complaint  Patient presents with  . Diabetes    pt states she is going to eye doctor this afternoon, I asked for her to ask them if they could fax [place] a copy of the report  . Hyperlipidemia  . Hypertension   Diabetes:  Using medications without difficulties No hypoglycemic episodes No hyperglycemic episodes Feet problems: none Blood Sugars averaging 142 eye exam within last year see above Takes 16 units Novolog with meals and Levimir 50 units QHS  Hypertension:  Using medications without difficulty Average home BPs Not checking   Using medication without problems or lightheadedness No chest pain with exertion or shortness of breath No Edema Average home BPs:  Elevated Cholesterol: Using medications without problems: No Muscle aches:  Diet compliance: Exercise:  Allergic rhinitis Using Flonase which helps     Relevant past medical, surgical, family and social history reviewed and updated as indicated. Interim medical history since our last visit reviewed. Allergies and medications reviewed and updated.  Review of Systems  Per HPI unless specifically indicated above     Objective:    BP 132/82 mmHg  Pulse 103  Temp(Src) 97.7 F (36.5 C)  Ht 4' 10.3" (1.481 m)  Wt 162 lb 3.2 oz (73.573 kg)  BMI 33.54 kg/m2  SpO2 96%  LMP  (LMP Unknown)  Wt Readings from Last 3 Encounters:  05/11/15 162 lb 3.2 oz (73.573 kg)  02/08/15 163 lb 3.2 oz (74.027 kg)  12/29/14 163 lb 5.8 oz (74.1 kg)    Physical Exam  Constitutional: She is oriented to person, place, and time. She appears well-developed and well-nourished. No distress.  HENT:  Head: Normocephalic  and atraumatic.  Eyes: Conjunctivae and lids are normal. Right eye exhibits no discharge. Left eye exhibits no discharge. No scleral icterus.  Neck: Normal range of motion. Neck supple. No JVD present. Carotid bruit is not present.  Cardiovascular: Normal rate, regular rhythm and normal heart sounds.   Pulmonary/Chest: Effort normal and breath sounds normal.  Abdominal: Normal appearance. There is no splenomegaly or hepatomegaly.  Musculoskeletal: Normal range of motion.  Neurological: She is alert and oriented to person, place, and time.  Skin: Skin is warm, dry and intact. No rash noted. No pallor.  Psychiatric: She has a normal mood and affect. Her behavior is normal. Judgment and thought content normal.    Results for orders placed or performed in visit on 02/08/15  Bayer DCA Hb A1c Waived  Result Value Ref Range   Bayer DCA Hb A1c Waived 7.0 (H) <7.0 %      Assessment & Plan:   Problem List Items Addressed This Visit      Unprioritized   Hyperlipidemia    Lipemic sample.  Wait for results      Relevant Orders   Lipid Panel Piccolo, Waived   Hypertensive CKD (chronic kidney disease)    Stable, continue present medications.        Relevant Orders   Comprehensive metabolic panel   Diabetes mellitus due to underlying condition with diabetic chronic kidney disease ([place]) -  Primary    Hgb A1C is 7.5.  Recommended increasing night time insulin      Relevant Orders   Comprehensive metabolic panel   Bayer DCA Hb A1c Waived       Follow up plan: Return in about 3 months (around 08/08/2015).

## 2015-05-12 LAB — LIPID PANEL W/O CHOL/HDL RATIO
Cholesterol, Total: 164 mg/dL (ref 100–199)
HDL: 39 mg/dL — AB (ref >39)
TRIGLYCERIDES: 418 mg/dL — AB (ref 0–149)

## 2015-05-12 LAB — COMPREHENSIVE METABOLIC PANEL
A/G RATIO: 1.4 (ref 1.1–2.5)
ALT: 19 IU/L (ref 0–32)
AST: 41 IU/L — AB (ref 0–40)
Albumin: 4.6 g/dL (ref 3.6–4.8)
Alkaline Phosphatase: 127 IU/L — ABNORMAL HIGH (ref 39–117)
BILIRUBIN TOTAL: 0.2 mg/dL (ref 0.0–1.2)
BUN/Creatinine Ratio: 22 (ref 11–26)
BUN: 20 mg/dL (ref 8–27)
CHLORIDE: 101 mmol/L (ref 96–106)
CO2: 20 mmol/L (ref 18–29)
Calcium: 9.9 mg/dL (ref 8.7–10.3)
Creatinine, Ser: 0.92 mg/dL (ref 0.57–1.00)
GFR calc non Af Amer: 67 mL/min/{1.73_m2} (ref >59)
GFR, EST AFRICAN AMERICAN: 77 mL/min/{1.73_m2} (ref >59)
Globulin, Total: 3.2 g/dL (ref 1.5–4.5)
Glucose: 171 mg/dL — ABNORMAL HIGH (ref 65–99)
POTASSIUM: 4.7 mmol/L (ref 3.5–5.2)
SODIUM: 141 mmol/L (ref 134–144)
Total Protein: 7.8 g/dL (ref 6.0–8.5)

## 2015-05-12 LAB — SPECIMEN STATUS REPORT

## 2015-05-12 LAB — BAYER DCA HB A1C WAIVED: HB A1C (BAYER DCA - WAIVED): 7.5 % — ABNORMAL HIGH (ref <7.0)

## 2015-05-14 ENCOUNTER — Other Ambulatory Visit: Payer: Self-pay | Admitting: Unknown Physician Specialty

## 2015-05-24 ENCOUNTER — Other Ambulatory Visit: Payer: Self-pay | Admitting: Unknown Physician Specialty

## 2015-05-25 ENCOUNTER — Other Ambulatory Visit: Payer: Self-pay | Admitting: Unknown Physician Specialty

## 2015-05-27 ENCOUNTER — Telehealth: Payer: Self-pay | Admitting: Unknown Physician Specialty

## 2015-05-27 MED ORDER — POLYMYXIN B-TRIMETHOPRIM 10000-0.1 UNIT/ML-% OP SOLN: 1.0000 [drp] | OPHTHALMIC | Status: DC

## 2015-05-27 NOTE — Telephone Encounter (Signed)
Routing to provider  

## 2015-05-27 NOTE — Telephone Encounter (Signed)
Pt has got pink eye from her granddaughter and would like to have something sent to [facility] [address]

## 2015-06-02 ENCOUNTER — Encounter: Payer: Self-pay | Admitting: *Deleted

## 2015-06-17 ENCOUNTER — Other Ambulatory Visit: Payer: Self-pay | Admitting: Unknown Physician Specialty

## 2015-06-19 NOTE — Telephone Encounter (Signed)
rx

## 2015-06-20 ENCOUNTER — Other Ambulatory Visit: Payer: Self-pay | Admitting: Unknown Physician Specialty

## 2015-06-20 NOTE — Telephone Encounter (Signed)
Your patient 

## 2015-06-28 ENCOUNTER — Other Ambulatory Visit: Payer: Self-pay | Admitting: Unknown Physician Specialty

## 2015-06-28 DIAGNOSIS — R69 Illness, unspecified: Secondary | ICD-10-CM | POA: Diagnosis not present

## 2015-06-29 ENCOUNTER — Inpatient Hospital Stay: Payer: Medicare HMO | Attending: Oncology

## 2015-06-29 DIAGNOSIS — K219 Gastro-esophageal reflux disease without esophagitis: Secondary | ICD-10-CM | POA: Diagnosis not present

## 2015-06-29 DIAGNOSIS — D472 Monoclonal gammopathy: Secondary | ICD-10-CM | POA: Diagnosis not present

## 2015-06-29 DIAGNOSIS — E785 Hyperlipidemia, unspecified: Secondary | ICD-10-CM | POA: Diagnosis not present

## 2015-06-29 DIAGNOSIS — E1165 Type 2 diabetes mellitus with hyperglycemia: Secondary | ICD-10-CM | POA: Insufficient documentation

## 2015-06-29 DIAGNOSIS — Z7982 Long term (current) use of aspirin: Secondary | ICD-10-CM | POA: Diagnosis not present

## 2015-06-29 DIAGNOSIS — M1990 Unspecified osteoarthritis, unspecified site: Secondary | ICD-10-CM | POA: Diagnosis not present

## 2015-06-29 DIAGNOSIS — R69 Illness, unspecified: Secondary | ICD-10-CM | POA: Diagnosis not present

## 2015-06-29 DIAGNOSIS — G629 Polyneuropathy, unspecified: Secondary | ICD-10-CM | POA: Diagnosis not present

## 2015-06-29 DIAGNOSIS — I129 Hypertensive chronic kidney disease with stage 1 through stage 4 chronic kidney disease, or unspecified chronic kidney disease: Secondary | ICD-10-CM | POA: Diagnosis not present

## 2015-06-29 DIAGNOSIS — N189 Chronic kidney disease, unspecified: Secondary | ICD-10-CM | POA: Diagnosis not present

## 2015-06-29 DIAGNOSIS — Z794 Long term (current) use of insulin: Secondary | ICD-10-CM | POA: Diagnosis not present

## 2015-06-29 DIAGNOSIS — F329 Major depressive disorder, single episode, unspecified: Secondary | ICD-10-CM | POA: Diagnosis not present

## 2015-06-29 DIAGNOSIS — Z79899 Other long term (current) drug therapy: Secondary | ICD-10-CM | POA: Insufficient documentation

## 2015-06-29 LAB — BASIC METABOLIC PANEL
Anion gap: 7 (ref 5–15)
BUN: 17 mg/dL (ref 6–20)
CHLORIDE: 106 mmol/L (ref 101–111)
CO2: 22 mmol/L (ref 22–32)
CREATININE: 1.06 mg/dL — AB (ref 0.44–1.00)
Calcium: 9.5 mg/dL (ref 8.9–10.3)
GFR calc Af Amer: >60 mL/min (ref >60)
GFR calc non Af Amer: 55 mL/min — ABNORMAL LOW (ref >60)
Glucose, Bld: 131 mg/dL — ABNORMAL HIGH (ref 65–99)
Potassium: 4.3 mmol/L (ref 3.5–5.1)
SODIUM: 135 mmol/L (ref 135–145)

## 2015-06-29 LAB — CBC
HCT: 39.1 % (ref 35.0–47.0)
HEMOGLOBIN: 13.4 g/dL (ref 12.0–16.0)
MCH: 31.4 pg (ref 26.0–34.0)
MCHC: 34.3 g/dL (ref 32.0–36.0)
MCV: 91.6 fL (ref 80.0–100.0)
Platelets: 305 10*3/uL (ref 150–440)
RBC: 4.27 MIL/uL (ref 3.80–5.20)
RDW: 14.4 % (ref 11.5–14.5)
WBC: 9.8 10*3/uL (ref 3.6–11.0)

## 2015-06-30 LAB — PROTEIN ELECTROPHORESIS, SERUM
A/G Ratio: 1.1 (ref 0.7–1.7)
Albumin ELP: 3.9 g/dL (ref 2.9–4.4)
Alpha-1-Globulin: 0.2 g/dL (ref 0.0–0.4)
Alpha-2-Globulin: 0.9 g/dL (ref 0.4–1.0)
Beta Globulin: 1 g/dL (ref 0.7–1.3)
GAMMA GLOBULIN: 1.3 g/dL (ref 0.4–1.8)
GLOBULIN, TOTAL: 3.4 g/dL (ref 2.2–3.9)
M-Spike, %: 0.7 g/dL — ABNORMAL HIGH
TOTAL PROTEIN ELP: 7.3 g/dL (ref 6.0–8.5)

## 2015-06-30 LAB — PROTEIN ELECTRO, RANDOM URINE
Albumin ELP, Urine: 55.4 %
Alpha-1-Globulin, U: 1.5 %
Alpha-2-Globulin, U: 11.3 %
BETA GLOBULIN, U: 18.2 %
Gamma Globulin, U: 13.7 %
TOTAL PROTEIN, URINE-UPE24: 28 mg/dL

## 2015-07-01 LAB — IMMUNOFIXATION ELECTROPHORESIS
IGA: 92 mg/dL (ref 87–352)
IGG (IMMUNOGLOBIN G), SERUM: 1254 mg/dL (ref 700–1600)
IgM, Serum: 115 mg/dL (ref 26–217)
Total Protein ELP: 7.3 g/dL (ref 6.0–8.5)

## 2015-07-06 ENCOUNTER — Inpatient Hospital Stay (HOSPITAL_BASED_OUTPATIENT_CLINIC_OR_DEPARTMENT_OTHER): Payer: Medicare HMO | Admitting: Oncology

## 2015-07-06 ENCOUNTER — Encounter: Payer: Self-pay | Admitting: Oncology

## 2015-07-06 VITALS — BP 123/79 | HR 111 | Temp 97.2°F | Resp 18 | Wt 163.4 lb

## 2015-07-06 DIAGNOSIS — G629 Polyneuropathy, unspecified: Secondary | ICD-10-CM

## 2015-07-06 DIAGNOSIS — D472 Monoclonal gammopathy: Secondary | ICD-10-CM

## 2015-07-06 DIAGNOSIS — R69 Illness, unspecified: Secondary | ICD-10-CM | POA: Diagnosis not present

## 2015-07-06 DIAGNOSIS — I129 Hypertensive chronic kidney disease with stage 1 through stage 4 chronic kidney disease, or unspecified chronic kidney disease: Secondary | ICD-10-CM | POA: Diagnosis not present

## 2015-07-06 DIAGNOSIS — E1165 Type 2 diabetes mellitus with hyperglycemia: Secondary | ICD-10-CM | POA: Diagnosis not present

## 2015-07-06 DIAGNOSIS — M1990 Unspecified osteoarthritis, unspecified site: Secondary | ICD-10-CM | POA: Diagnosis not present

## 2015-07-06 DIAGNOSIS — E785 Hyperlipidemia, unspecified: Secondary | ICD-10-CM | POA: Diagnosis not present

## 2015-07-06 DIAGNOSIS — N189 Chronic kidney disease, unspecified: Secondary | ICD-10-CM | POA: Diagnosis not present

## 2015-07-06 DIAGNOSIS — Z79899 Other long term (current) drug therapy: Secondary | ICD-10-CM

## 2015-07-06 DIAGNOSIS — K219 Gastro-esophageal reflux disease without esophagitis: Secondary | ICD-10-CM | POA: Diagnosis not present

## 2015-07-06 NOTE — Progress Notes (Signed)
[place]  Telephone:(336) [phone] Fax:(336) [phone]  ID: MERINDA VICTORINO OB: Jul 11, 1950  MR#: [id number]  GQQ#:[id number]  Patient Care Team: Kathrine Haddock, NP as PCP - General (Nurse Practitioner)  CHIEF COMPLAINT: MGUS  INTERVAL HISTORY: Patient returns to clinic today for repeat laboratory work and further evaluation. She continues to feel well and remains asymptomatic. She denies any recent fevers or illnesses. She has a good appetite and denies weight loss.  She has a mild peripheral neuropathy, but no other neurologic complaints.  She denies any bony pain.  She has no chest pain or shortness of breath.  She denies any nausea, vomiting, constipation, or diarrhea.  She has no urinary complaints.  Patient offers no specific complaints today.  REVIEW OF SYSTEMS:   Review of Systems  Constitutional: Negative.  Negative for fever, weight loss and malaise/fatigue.  Respiratory: Negative.  Negative for shortness of breath.   Cardiovascular: Negative.  Negative for chest pain.  Gastrointestinal: Negative.   Genitourinary: Negative.   Musculoskeletal: Negative.   Neurological: Positive for sensory change. Negative for weakness.  Psychiatric/Behavioral: Negative.     As per HPI. Otherwise, a complete review of systems is negatve.  PAST MEDICAL HISTORY: Past Medical History  Diagnosis Date  . Diabetes mellitus without complication ([place])   . Hyperlipidemia   . Hypertension   . Depression   . Chronic kidney disease   . Arthritis     Osteoarthritis  . GERD (gastroesophageal reflux disease)   . Allergy   . Obesity   . Last menstrual period (LMP) > 10 days ago 1986    PAST SURGICAL HISTORY: Past Surgical History  Procedure Laterality Date  . Appendectomy    . Abdominal hysterectomy    . Wrist fracture surgery Left     Steel plate  . Patella fracture surgery Left     FAMILY HISTORY Family History  Problem Relation Age of Onset  . Diabetes Mother     . Heart disease Mother   . Hypertension Mother   . Diabetes Sister   . Cancer Father     stomach  . Arthritis Sister   . Stroke Sister   . Hypertension Sister        ADVANCED DIRECTIVES:    HEALTH MAINTENANCE: Social History  Substance Use Topics  . Smoking status: Never Smoker   . Smokeless tobacco: Never Used  . Alcohol Use: 0.0 oz/week    0 Standard drinks or equivalent per week     Comment: occasionally holiday, weddings     Colonoscopy:  PAP:  Bone density:  Lipid panel:  Allergies  Allergen Reactions  . Ibuprofen Other (See Comments)    "ringing in ears"  . Strawberry Extract Swelling    Lips swell and rash over body    Current Outpatient Prescriptions  Medication Sig Dispense Refill  . allopurinol (ZYLOPRIM) 300 MG tablet TAKE ONE TABLET BY MOUTH EVERY DAY 30 tablet 0  . amLODipine (NORVASC) 5 MG tablet TAKE ONE TABLET BY MOUTH EVERY DAY 30 tablet 2  . aspirin EC 81 MG tablet Take 81 mg by mouth daily.    . Calcium Carb-Cholecalciferol (CALCIUM 600 + D PO) Take 1 tablet by mouth 2 (two) times daily.    . dorzolamide-timolol (COSOPT) 22.3-6.8 MG/ML ophthalmic solution Place 1 drop into both eyes 2 (two) times daily.    . enalapril (VASOTEC) 2.5 MG tablet TAKE ONE TABLET BY MOUTH EVERY DAY 90 tablet 0  . ferrous fumarate (HEMOCYTE -  106 MG FE) 325 (106 FE) MG TABS tablet Take 1 tablet by mouth daily.    . fluticasone (FLONASE) 50 MCG/ACT nasal spray Place 2 sprays into both nostrils daily.    Marland Kitchen glucose blood test strip 1 each by Other route 3 (three) times daily. Use as instructed 100 each 3  . insulin aspart (NOVOLOG) 100 UNIT/ML FlexPen Inject 15 Units into the skin 3 (three) times daily with meals. 15 mL 11  . Insulin Pen Needle 32G X 6 MM MISC by Does not apply route 3 (three) times daily.    Marland Kitchen LEVEMIR FLEXTOUCH 100 UNIT/ML Pen INJECT 60 UNITS UNDER THE SKIN EVERY DAY AT 10PM 15 mL 0  . loperamide (IMODIUM) 2 MG capsule Take 4 mg by mouth as needed for  diarrhea or loose stools.    . metoprolol succinate (TOPROL-XL) 25 MG 24 hr tablet TAKE ONE TABLET BY MOUTH EVERY DAY 30 tablet 0  . ranitidine (ZANTAC) 150 MG tablet Take 150 mg by mouth 2 (two) times daily.    . sertraline (ZOLOFT) 100 MG tablet TAKE ONE TABLET BY MOUTH EVERY MORNING 90 tablet 0  . simvastatin (ZOCOR) 40 MG tablet TAKE 1 TABLET BY MOUTH EVERY NIGHT AT BEDTIME 90 tablet 0  . Specialty Vitamins Products (VITA-RX DIABETIC VITAMIN) CAPS Take 1 packet by mouth daily. Diabetic Vitamins - 6 or 7 in 1 pack    . trimethoprim-polymyxin b (POLYTRIM) ophthalmic solution Place 1 drop into both eyes every 4 (four) hours. 10 mL 0  . VICTOZA 18 MG/3ML SOPN ADMINISTER 1.8 MG UNDER THE SKIN EVERY MORNING AS DIRECTED 9 mL 0   No current facility-administered medications for this visit.    OBJECTIVE: Filed Vitals:   07/06/15 1125  BP: 123/79  Pulse: 111  Temp: 97.2 F (36.2 C)  Resp: 18     Body mass index is 33.78 kg/(m^2).    ECOG FS:0 - Asymptomatic  General: Well-developed, well-nourished, no acute distress. Eyes: Pink conjunctiva, anicteric sclera. Lungs: Clear to auscultation bilaterally. Heart: Regular rate and rhythm. No rubs, murmurs, or gallops. Abdomen: Soft, nontender, nondistended. No organomegaly noted, normoactive bowel sounds. Musculoskeletal: No edema, cyanosis, or clubbing. Neuro: Alert, answering all questions appropriately. Cranial nerves grossly intact. Skin: No rashes or petechiae noted. Psych: Normal affect.   LAB RESULTS:  Lab Results  Component Value Date   NA 135 06/29/2015   K 4.3 06/29/2015   CL 106 06/29/2015   CO2 22 06/29/2015   GLUCOSE 131* 06/29/2015   BUN 17 06/29/2015   CREATININE 1.06* 06/29/2015   CALCIUM 9.5 06/29/2015   PROT 7.8 05/11/2015   ALBUMIN 4.6 05/11/2015   AST 41* 05/11/2015   ALT 19 05/11/2015   ALKPHOS 127* 05/11/2015   BILITOT 0.2 05/11/2015   GFRNONAA 55* 06/29/2015   GFRAA >60 06/29/2015    Lab Results    Component Value Date   WBC 9.8 06/29/2015   NEUTROABS 4.2 12/22/2014   HGB 13.4 06/29/2015   HCT 39.1 06/29/2015   MCV 91.6 06/29/2015   PLT 305 06/29/2015   Lab Results  Component Value Date   TOTALPROTELP 7.3 06/29/2015   TOTALPROTELP 7.3 06/29/2015   ALBUMINELP 3.9 06/29/2015   A1GS 0.2 06/29/2015   A2GS 0.9 06/29/2015   BETS 1.0 06/29/2015   GAMS 1.3 06/29/2015   MSPIKE 0.7* 06/29/2015   SPEI Comment 06/29/2015     STUDIES: No results found.  ASSESSMENT: MGUS  PLAN:    1.  MGUS: Patient's most recent  M spike is unchanged at 0.7.  It was 0.6 six months ago. She has no evidence of endorgan damage and her kidney function is improved.  Previously, metastatic bone survey was reported as negative.  This does not need to be repeated unless there is concern of progression of disease.  Patient does not require bone marrow biopsy at this time.  She will return to clinic in 6 months for repeat laboratory work and routine evaluation.   2.  Chronic renal insufficiency: Improved.  Treatment per Dr. Holley Raring. 3.  Hyperglycemia:  Blood sugars are better controlled.  Continue current diabetic medications. Treatment per PCP. 4.  Peripheral neuropathy: Secondary to diabetes. Monitor.   Patient expressed understanding and was in agreement with this plan. She also understands that She can call clinic at any time with any questions, concerns, or complaints.   Lloyd Huger, MD   07/06/2015 5:26 PM

## 2015-07-15 ENCOUNTER — Other Ambulatory Visit: Payer: Self-pay | Admitting: Unknown Physician Specialty

## 2015-07-27 ENCOUNTER — Other Ambulatory Visit: Payer: Self-pay | Admitting: Unknown Physician Specialty

## 2015-07-27 DIAGNOSIS — R69 Illness, unspecified: Secondary | ICD-10-CM | POA: Diagnosis not present

## 2015-08-10 ENCOUNTER — Ambulatory Visit: Payer: Medicare HMO | Admitting: Unknown Physician Specialty

## 2015-08-12 ENCOUNTER — Encounter: Payer: Self-pay | Admitting: Emergency Medicine

## 2015-08-12 ENCOUNTER — Ambulatory Visit (INDEPENDENT_AMBULATORY_CARE_PROVIDER_SITE_OTHER): Payer: Medicare HMO | Admitting: Unknown Physician Specialty

## 2015-08-12 ENCOUNTER — Inpatient Hospital Stay
Admission: EM | Admit: 2015-08-12 | Discharge: 2015-08-15 | DRG: 872 | Disposition: A | Discharge status: Home / Self Care | Payer: Medicare HMO | Attending: Internal Medicine | Admitting: Internal Medicine

## 2015-08-12 ENCOUNTER — Inpatient Hospital Stay: Payer: Medicare HMO

## 2015-08-12 ENCOUNTER — Encounter: Payer: Self-pay | Admitting: Unknown Physician Specialty

## 2015-08-12 ENCOUNTER — Emergency Department: Payer: Medicare HMO

## 2015-08-12 VITALS — BP 110/78 | HR 114 | Temp 98.0°F | Ht 58.3 in | Wt 152.2 lb

## 2015-08-12 DIAGNOSIS — N183 Chronic kidney disease, stage 3 unspecified: Secondary | ICD-10-CM | POA: Insufficient documentation

## 2015-08-12 DIAGNOSIS — Z79899 Other long term (current) drug therapy: Secondary | ICD-10-CM

## 2015-08-12 DIAGNOSIS — E1122 Type 2 diabetes mellitus with diabetic chronic kidney disease: Secondary | ICD-10-CM | POA: Diagnosis not present

## 2015-08-12 DIAGNOSIS — D649 Anemia, unspecified: Secondary | ICD-10-CM | POA: Diagnosis not present

## 2015-08-12 DIAGNOSIS — I248 Other forms of acute ischemic heart disease: Secondary | ICD-10-CM | POA: Diagnosis present

## 2015-08-12 DIAGNOSIS — Z794 Long term (current) use of insulin: Secondary | ICD-10-CM

## 2015-08-12 DIAGNOSIS — E785 Hyperlipidemia, unspecified: Secondary | ICD-10-CM | POA: Diagnosis not present

## 2015-08-12 DIAGNOSIS — I10 Essential (primary) hypertension: Secondary | ICD-10-CM | POA: Diagnosis not present

## 2015-08-12 DIAGNOSIS — F329 Major depressive disorder, single episode, unspecified: Secondary | ICD-10-CM | POA: Diagnosis present

## 2015-08-12 DIAGNOSIS — N189 Chronic kidney disease, unspecified: Secondary | ICD-10-CM | POA: Diagnosis not present

## 2015-08-12 DIAGNOSIS — Z6829 Body mass index (BMI) 29.0-29.9, adult: Secondary | ICD-10-CM

## 2015-08-12 DIAGNOSIS — K5720 Diverticulitis of large intestine with perforation and abscess without bleeding: Secondary | ICD-10-CM | POA: Diagnosis not present

## 2015-08-12 DIAGNOSIS — Z800 Family history of malignant neoplasm of digestive organs: Secondary | ICD-10-CM | POA: Diagnosis not present

## 2015-08-12 DIAGNOSIS — M1990 Unspecified osteoarthritis, unspecified site: Secondary | ICD-10-CM | POA: Diagnosis present

## 2015-08-12 DIAGNOSIS — N179 Acute kidney failure, unspecified: Secondary | ICD-10-CM | POA: Diagnosis not present

## 2015-08-12 DIAGNOSIS — Z7951 Long term (current) use of inhaled steroids: Secondary | ICD-10-CM

## 2015-08-12 DIAGNOSIS — A419 Sepsis, unspecified organism: Secondary | ICD-10-CM | POA: Diagnosis not present

## 2015-08-12 DIAGNOSIS — Z833 Family history of diabetes mellitus: Secondary | ICD-10-CM

## 2015-08-12 DIAGNOSIS — R1032 Left lower quadrant pain: Secondary | ICD-10-CM | POA: Diagnosis not present

## 2015-08-12 DIAGNOSIS — E119 Type 2 diabetes mellitus without complications: Secondary | ICD-10-CM | POA: Diagnosis not present

## 2015-08-12 DIAGNOSIS — Z8249 Family history of ischemic heart disease and other diseases of the circulatory system: Secondary | ICD-10-CM | POA: Diagnosis not present

## 2015-08-12 DIAGNOSIS — Z823 Family history of stroke: Secondary | ICD-10-CM | POA: Diagnosis not present

## 2015-08-12 DIAGNOSIS — M109 Gout, unspecified: Secondary | ICD-10-CM | POA: Diagnosis present

## 2015-08-12 DIAGNOSIS — Z0000 Encounter for general adult medical examination without abnormal findings: Secondary | ICD-10-CM

## 2015-08-12 DIAGNOSIS — E669 Obesity, unspecified: Secondary | ICD-10-CM | POA: Diagnosis present

## 2015-08-12 DIAGNOSIS — I129 Hypertensive chronic kidney disease with stage 1 through stage 4 chronic kidney disease, or unspecified chronic kidney disease: Secondary | ICD-10-CM | POA: Diagnosis present

## 2015-08-12 DIAGNOSIS — E876 Hypokalemia: Secondary | ICD-10-CM | POA: Diagnosis not present

## 2015-08-12 DIAGNOSIS — Z7982 Long term (current) use of aspirin: Secondary | ICD-10-CM

## 2015-08-12 DIAGNOSIS — Z8261 Family history of arthritis: Secondary | ICD-10-CM | POA: Diagnosis not present

## 2015-08-12 DIAGNOSIS — R69 Illness, unspecified: Secondary | ICD-10-CM | POA: Diagnosis not present

## 2015-08-12 DIAGNOSIS — K219 Gastro-esophageal reflux disease without esophagitis: Secondary | ICD-10-CM | POA: Diagnosis present

## 2015-08-12 DIAGNOSIS — E1022 Type 1 diabetes mellitus with diabetic chronic kidney disease: Secondary | ICD-10-CM | POA: Diagnosis not present

## 2015-08-12 DIAGNOSIS — K5780 Diverticulitis of intestine, part unspecified, with perforation and abscess without bleeding: Secondary | ICD-10-CM | POA: Diagnosis not present

## 2015-08-12 HISTORY — DX: Disorder of kidney and ureter, unspecified: N28.9

## 2015-08-12 LAB — CBC
HCT: 38.5 % (ref 35.0–47.0)
HEMOGLOBIN: 12.7 g/dL (ref 12.0–16.0)
MCH: 29.6 pg (ref 26.0–34.0)
MCHC: 32.9 g/dL (ref 32.0–36.0)
MCV: 89.7 fL (ref 80.0–100.0)
Platelets: 460 10*3/uL — ABNORMAL HIGH (ref 150–440)
RBC: 4.29 MIL/uL (ref 3.80–5.20)
RDW: 15 % — AB (ref 11.5–14.5)
WBC: 21 10*3/uL — ABNORMAL HIGH (ref 3.6–11.0)

## 2015-08-12 LAB — CBC WITH DIFFERENTIAL/PLATELET
Hematocrit: 36.4 % (ref 34.0–46.6)
Hemoglobin: 12.7 g/dL (ref 11.1–15.9)
LYMPHS ABS: 3.2 10*3/uL — AB (ref 0.7–3.1)
Lymphs: 17 %
MCH: 31.4 pg (ref 26.6–33.0)
MCHC: 34.9 g/dL (ref 31.5–35.7)
MCV: 90 fL (ref 79–97)
MID (Absolute): 1.3 10*3/uL (ref 0.1–1.6)
MID: 7 %
NEUTROS ABS: 14.5 10*3/uL — AB (ref 1.4–7.0)
NEUTROS PCT: 76 %
Platelets: 436 10*3/uL — ABNORMAL HIGH (ref 150–379)
RBC: 4.04 x10E6/uL (ref 3.77–5.28)
RDW: 14.6 % (ref 12.3–15.4)
WBC: 19 10*3/uL — AB (ref 3.4–10.8)

## 2015-08-12 LAB — URINALYSIS COMPLETE WITH MICROSCOPIC (ARMC ONLY)
BILIRUBIN URINE: NEGATIVE
GLUCOSE, UA: NEGATIVE mg/dL
HGB URINE DIPSTICK: NEGATIVE
Ketones, ur: NEGATIVE mg/dL
NITRITE: NEGATIVE
Protein, ur: NEGATIVE mg/dL
SPECIFIC GRAVITY, URINE: 1.021 (ref 1.005–1.030)
pH: 5 (ref 5.0–8.0)

## 2015-08-12 LAB — COMPREHENSIVE METABOLIC PANEL
ALBUMIN: 4 g/dL (ref 3.5–5.0)
ALK PHOS: 132 U/L — AB (ref 38–126)
ALT: 15 U/L (ref 14–54)
AST: 35 U/L (ref 15–41)
Anion gap: 12 (ref 5–15)
BILIRUBIN TOTAL: 0.8 mg/dL (ref 0.3–1.2)
BUN: 18 mg/dL (ref 6–20)
CALCIUM: 9.5 mg/dL (ref 8.9–10.3)
CO2: 22 mmol/L (ref 22–32)
CREATININE: 1.21 mg/dL — AB (ref 0.44–1.00)
Chloride: 100 mmol/L — ABNORMAL LOW (ref 101–111)
GFR calc Af Amer: 54 mL/min — ABNORMAL LOW (ref >60)
GFR calc non Af Amer: 47 mL/min — ABNORMAL LOW (ref >60)
GLUCOSE: 161 mg/dL — AB (ref 65–99)
Potassium: 4.5 mmol/L (ref 3.5–5.1)
SODIUM: 134 mmol/L — AB (ref 135–145)
TOTAL PROTEIN: 8.4 g/dL — AB (ref 6.5–8.1)

## 2015-08-12 LAB — GLUCOSE, CAPILLARY
GLUCOSE-CAPILLARY: 128 mg/dL — AB (ref 65–99)
Glucose-Capillary: 102 mg/dL — ABNORMAL HIGH (ref 65–99)
Glucose-Capillary: 75 mg/dL (ref 65–99)

## 2015-08-12 LAB — TROPONIN I: TROPONIN I: 0.05 ng/mL — AB (ref <0.031)

## 2015-08-12 LAB — LIPASE, BLOOD: Lipase: 48 U/L (ref 11–51)

## 2015-08-12 LAB — LACTIC ACID, PLASMA: LACTIC ACID, VENOUS: 1.1 mmol/L (ref 0.5–2.0)

## 2015-08-12 LAB — BAYER DCA HB A1C WAIVED: HB A1C (BAYER DCA - WAIVED): 6.7 % (ref <7.0)

## 2015-08-12 MED ORDER — AMLODIPINE BESYLATE 5 MG PO TABS
5.0000 mg | ORAL_TABLET | Freq: Every day | ORAL | Status: DC
  Administered 2015-08-13 – 2015-08-15 (×3): 5 mg via ORAL
  Filled 2015-08-12 (×3): qty 1

## 2015-08-12 MED ORDER — HYDROCODONE-ACETAMINOPHEN 5-325 MG PO TABS: 1.0000 | ORAL_TABLET | ORAL | Status: DC | PRN

## 2015-08-12 MED ORDER — FERROUS FUMARATE 324 (106 FE) MG PO TABS
1.0000 | ORAL_TABLET | Freq: Every day | ORAL | Status: DC
Start: 2015-08-12 — End: 2015-08-15
  Administered 2015-08-12 – 2015-08-15 (×4): 106 mg via ORAL
  Filled 2015-08-12 (×4): qty 1

## 2015-08-12 MED ORDER — METOPROLOL SUCCINATE ER 25 MG PO TB24
25.0000 mg | ORAL_TABLET | Freq: Every day | ORAL | Status: DC
  Administered 2015-08-13 – 2015-08-15 (×3): 25 mg via ORAL
  Filled 2015-08-12 (×3): qty 1

## 2015-08-12 MED ORDER — FERROUS FUMARATE 325 (106 FE) MG PO TABS: 1.0000 | ORAL_TABLET | Freq: Every day | ORAL | Status: DC

## 2015-08-12 MED ORDER — OXYCODONE HCL 5 MG PO TABS
5.0000 mg | ORAL_TABLET | ORAL | Status: DC | PRN
  Administered 2015-08-12 – 2015-08-14 (×2): 5 mg via ORAL
  Filled 2015-08-12 (×2): qty 1

## 2015-08-12 MED ORDER — MIDAZOLAM HCL 2 MG/2ML IJ SOLN
INTRAMUSCULAR | Status: AC
Start: 2015-08-12 — End: 2015-08-13
  Filled 2015-08-12: qty 2

## 2015-08-12 MED ORDER — PIPERACILLIN-TAZOBACTAM 3.375 G IVPB
3.3750 g | Freq: Three times a day (TID) | INTRAVENOUS | Status: DC
  Administered 2015-08-12 – 2015-08-14 (×6): 3.375 g via INTRAVENOUS
  Filled 2015-08-12 (×9): qty 50

## 2015-08-12 MED ORDER — PIPERACILLIN-TAZOBACTAM 3.375 G IVPB 30 MIN: 3.3750 g | Freq: Once | INTRAVENOUS | Status: DC

## 2015-08-12 MED ORDER — SODIUM CHLORIDE 0.9 % IV BOLUS (SEPSIS)
250.0000 mL | Freq: Once | INTRAVENOUS | Status: AC
Start: 2015-08-12 — End: 2015-08-12
  Administered 2015-08-12: 250 mL via INTRAVENOUS

## 2015-08-12 MED ORDER — FENTANYL CITRATE (PF) 100 MCG/2ML IJ SOLN
INTRAMUSCULAR | Status: AC
  Filled 2015-08-12: qty 4

## 2015-08-12 MED ORDER — CALCIUM CARBONATE-VITAMIN D 600-400 MG-UNIT PO TABS: 1.0000 | ORAL_TABLET | Freq: Every day | ORAL | Status: DC

## 2015-08-12 MED ORDER — ONDANSETRON HCL 4 MG PO TABS: 4.0000 mg | ORAL_TABLET | Freq: Four times a day (QID) | ORAL | Status: DC | PRN

## 2015-08-12 MED ORDER — ACETAMINOPHEN 650 MG RE SUPP: 650.0000 mg | Freq: Four times a day (QID) | RECTAL | Status: DC | PRN

## 2015-08-12 MED ORDER — ONDANSETRON HCL 4 MG/2ML IJ SOLN
4.0000 mg | Freq: Four times a day (QID) | INTRAMUSCULAR | Status: DC | PRN
  Administered 2015-08-13: 4 mg via INTRAVENOUS
  Filled 2015-08-12: qty 2

## 2015-08-12 MED ORDER — INSULIN DETEMIR 100 UNIT/ML ~~LOC~~ SOLN
25.0000 [IU] | Freq: Every day | SUBCUTANEOUS | Status: DC
  Administered 2015-08-14 – 2015-08-15 (×2): 25 [IU] via SUBCUTANEOUS
  Filled 2015-08-12 (×5): qty 0.25

## 2015-08-12 MED ORDER — ALLOPURINOL 300 MG PO TABS
300.0000 mg | ORAL_TABLET | Freq: Every day | ORAL | Status: DC
  Administered 2015-08-12 – 2015-08-15 (×4): 300 mg via ORAL
  Filled 2015-08-12 (×4): qty 1

## 2015-08-12 MED ORDER — SERTRALINE HCL 100 MG PO TABS
100.0000 mg | ORAL_TABLET | Freq: Every morning | ORAL | Status: DC
  Administered 2015-08-13 – 2015-08-15 (×3): 100 mg via ORAL
  Filled 2015-08-12 (×3): qty 1

## 2015-08-12 MED ORDER — MORPHINE SULFATE (PF) 4 MG/ML IV SOLN
4.0000 mg | Freq: Once | INTRAVENOUS | Status: AC
  Administered 2015-08-12: 4 mg via INTRAVENOUS
  Filled 2015-08-12: qty 1

## 2015-08-12 MED ORDER — IOPAMIDOL (ISOVUE-300) INJECTION 61%
100.0000 mL | Freq: Once | INTRAVENOUS | Status: AC | PRN
  Administered 2015-08-12: 100 mL via INTRAVENOUS

## 2015-08-12 MED ORDER — SODIUM CHLORIDE 0.9 % IV BOLUS (SEPSIS)
1000.0000 mL | Freq: Once | INTRAVENOUS | Status: AC
  Administered 2015-08-12: 1000 mL via INTRAVENOUS

## 2015-08-12 MED ORDER — SODIUM CHLORIDE 0.9 % IV SOLN
INTRAVENOUS | Status: DC
  Administered 2015-08-12 – 2015-08-13 (×4): via INTRAVENOUS

## 2015-08-12 MED ORDER — MIDAZOLAM HCL 2 MG/2ML IJ SOLN
INTRAMUSCULAR | Status: AC | PRN
  Administered 2015-08-12 (×2): 1 mg via INTRAVENOUS

## 2015-08-12 MED ORDER — CALCIUM CARBONATE-VITAMIN D 500-200 MG-UNIT PO TABS
1.0000 | ORAL_TABLET | Freq: Every day | ORAL | Status: DC
  Administered 2015-08-13 – 2015-08-15 (×3): 1 via ORAL
  Filled 2015-08-12 (×3): qty 1

## 2015-08-12 MED ORDER — MORPHINE SULFATE (PF) 2 MG/ML IV SOLN
1.0000 mg | INTRAVENOUS | Status: DC | PRN
Start: 2015-08-12 — End: 2015-08-15
  Administered 2015-08-13: 1 mg via INTRAVENOUS
  Filled 2015-08-12: qty 1

## 2015-08-12 MED ORDER — FLUTICASONE PROPIONATE 50 MCG/ACT NA SUSP
2.0000 | Freq: Every day | NASAL | Status: DC | PRN
  Filled 2015-08-12: qty 16

## 2015-08-12 MED ORDER — DORZOLAMIDE HCL-TIMOLOL MAL 2-0.5 % OP SOLN
1.0000 [drp] | Freq: Two times a day (BID) | OPHTHALMIC | Status: DC
  Administered 2015-08-13 – 2015-08-15 (×5): 1 [drp] via OPHTHALMIC
  Filled 2015-08-12 (×2): qty 10

## 2015-08-12 MED ORDER — DIATRIZOATE MEGLUMINE & SODIUM 66-10 % PO SOLN
15.0000 mL | Freq: Once | ORAL | Status: AC
  Administered 2015-08-12: 15 mL via ORAL

## 2015-08-12 MED ORDER — PIPERACILLIN-TAZOBACTAM 3.375 G IVPB 30 MIN
3.3750 g | Freq: Once | INTRAVENOUS | Status: AC
Start: 2015-08-12 — End: 2015-08-12
  Administered 2015-08-12: 3.375 g via INTRAVENOUS
  Filled 2015-08-12: qty 50

## 2015-08-12 MED ORDER — SIMVASTATIN 40 MG PO TABS
40.0000 mg | ORAL_TABLET | Freq: Every day | ORAL | Status: DC
  Administered 2015-08-12 – 2015-08-14 (×3): 40 mg via ORAL
  Filled 2015-08-12 (×3): qty 1

## 2015-08-12 MED ORDER — ONDANSETRON HCL 4 MG/2ML IJ SOLN
4.0000 mg | Freq: Once | INTRAMUSCULAR | Status: AC
  Administered 2015-08-12: 4 mg via INTRAVENOUS
  Filled 2015-08-12: qty 2

## 2015-08-12 MED ORDER — INSULIN ASPART 100 UNIT/ML ~~LOC~~ SOLN
0.0000 [IU] | Freq: Three times a day (TID) | SUBCUTANEOUS | Status: DC
  Administered 2015-08-13: 1 [IU] via SUBCUTANEOUS
  Administered 2015-08-13 (×2): 2 [IU] via SUBCUTANEOUS
  Administered 2015-08-14: 1 [IU] via SUBCUTANEOUS
  Administered 2015-08-14: 2 [IU] via SUBCUTANEOUS
  Administered 2015-08-15: 3 [IU] via SUBCUTANEOUS
  Filled 2015-08-12: qty 3
  Filled 2015-08-12: qty 2
  Filled 2015-08-12 (×2): qty 1
  Filled 2015-08-12 (×2): qty 2

## 2015-08-12 MED ORDER — FENTANYL CITRATE (PF) 100 MCG/2ML IJ SOLN
INTRAMUSCULAR | Status: AC | PRN
  Administered 2015-08-12: 25 ug via INTRAVENOUS
  Administered 2015-08-12: 50 ug via INTRAVENOUS
  Administered 2015-08-12: 25 ug via INTRAVENOUS

## 2015-08-12 MED ORDER — ACETAMINOPHEN 325 MG PO TABS: 650.0000 mg | ORAL_TABLET | Freq: Four times a day (QID) | ORAL | Status: DC | PRN

## 2015-08-12 MED ORDER — ENOXAPARIN SODIUM 40 MG/0.4ML ~~LOC~~ SOLN
40.0000 mg | SUBCUTANEOUS | Status: DC
Start: 2015-08-12 — End: 2015-08-15
  Administered 2015-08-12 – 2015-08-14 (×3): 40 mg via SUBCUTANEOUS
  Filled 2015-08-12 (×3): qty 0.4

## 2015-08-12 MED ORDER — ASPIRIN EC 81 MG PO TBEC
81.0000 mg | DELAYED_RELEASE_TABLET | Freq: Every day | ORAL | Status: DC
  Administered 2015-08-12 – 2015-08-15 (×4): 81 mg via ORAL
  Filled 2015-08-12 (×4): qty 1

## 2015-08-12 NOTE — H&P (Signed)
[facility] NAME: Kara Leach    MR#:  [id number]  DATE OF BIRTH:  [birth year]  DATE OF ADMISSION:  08/12/2015  PRIMARY CARE PHYSICIAN: Kathrine Haddock, NP   REQUESTING/REFERRING PHYSICIAN: Dr Archie Balboa  CHIEF COMPLAINT:  Nausea vomiting and left lower quadrant abdominal pain since past Monday.  HISTORY OF PRESENT ILLNESS:  Kara Leach  is a 65 y.o. female with a known history of Hypertension, diabetes type 2 insulin requiring, osteoarthritis, hyperlipidemia comes to the emergency room with complaints of intractable nausea and vomiting and left lower quadrant abdominal pain. Patient was seen in the primary care's office and found to have elevated white count of 19,000 and had low blood pressure. She was sent to the emergency room where evaluation and workup noted patient has proximal and mid sigmoid diverticulitis with proximal sigmoid diverticular abscess. Patient was seen by surgery in the emergency room given multiple medical problems internal medicine was requested to admit the patient. Patient received IV Zosyn. Her white count is 21,000.  PAST MEDICAL HISTORY:   Past Medical History  Diagnosis Date  . Diabetes mellitus without complication ([place])   . Hyperlipidemia   . Hypertension   . Depression   . Chronic kidney disease   . Arthritis     Osteoarthritis  . GERD (gastroesophageal reflux disease)   . Allergy   . Obesity   . Last menstrual period (LMP) > 10 days ago 1986  . Renal insufficiency     PAST SURGICAL HISTOIRY:   Past Surgical History  Procedure Laterality Date  . Appendectomy    . Abdominal hysterectomy    . Wrist fracture surgery Left     Steel plate  . Patella fracture surgery Left     SOCIAL HISTORY:   Social History  Substance Use Topics  . Smoking status: Never Smoker   . Smokeless tobacco: Never Used  . Alcohol Use: 0.0 oz/week    0 Standard drinks or equivalent per week   Comment: occasionally holiday, weddings    FAMILY HISTORY:   Family History  Problem Relation Age of Onset  . Diabetes Mother   . Heart disease Mother   . Hypertension Mother   . Diabetes Sister   . Cancer Father     stomach  . Arthritis Sister   . Stroke Sister   . Hypertension Sister     DRUG ALLERGIES:   Allergies  Allergen Reactions  . Ibuprofen Other (See Comments)    "ringing in ears"  . Strawberry Extract Swelling    Lips swell and rash over body    REVIEW OF SYSTEMS:  Review of Systems  Constitutional: Negative for fever, chills and weight loss.  HENT: Negative for ear discharge, ear pain and nosebleeds.   Eyes: Negative for blurred vision, pain and discharge.  Respiratory: Negative for sputum production, shortness of breath, wheezing and stridor.   Cardiovascular: Negative for chest pain, palpitations, orthopnea and PND.  Gastrointestinal: Positive for nausea, vomiting and abdominal pain. Negative for diarrhea.  Genitourinary: Negative for urgency and frequency.  Musculoskeletal: Negative for back pain and joint pain.  Neurological: Negative for sensory change, speech change, focal weakness and weakness.  Psychiatric/Behavioral: Negative for depression and hallucinations. The patient is not nervous/anxious.   All other systems reviewed and are negative.    MEDICATIONS AT HOME:   Prior to Admission medications   Medication Sig Start Date End Date Taking? Authorizing Provider  allopurinol (ZYLOPRIM) 300 MG  tablet TAKE ONE TABLET BY MOUTH EVERY DAY 07/15/15  Yes Kathrine Haddock, NP  amLODipine (NORVASC) 5 MG tablet TAKE 1 TABLET BY MOUTH EVERY DAY 07/15/15  Yes Kathrine Haddock, NP  aspirin EC 81 MG tablet Take 81 mg by mouth daily.   Yes Historical Provider, MD  Calcium Carb-Cholecalciferol (CALCIUM 600 + D PO) Take 1 tablet by mouth 2 (two) times daily.   Yes Historical Provider, MD  dorzolamide-timolol (COSOPT) 22.3-6.8 MG/ML ophthalmic solution Place 1 drop into  both eyes 2 (two) times daily.   Yes Historical Provider, MD  enalapril (VASOTEC) 2.5 MG tablet TAKE ONE TABLET BY MOUTH EVERY DAY 05/16/15  Yes Kathrine Haddock, NP  fluticasone (FLONASE) 50 MCG/ACT nasal spray Place 2 sprays into both nostrils daily as needed for allergies.    Yes Historical Provider, MD  insulin aspart (NOVOLOG) 100 UNIT/ML FlexPen Inject 15 Units into the skin 3 (three) times daily with meals. 04/19/15  Yes Kathrine Haddock, NP  LEVEMIR FLEXTOUCH 100 UNIT/ML Pen INJECT 60 UNITS UNDER THE SKIN EVERY DAY AT 10PM 07/15/15  Yes Kathrine Haddock, NP  loperamide (IMODIUM) 2 MG capsule Take 4 mg by mouth as needed for diarrhea or loose stools.   Yes Historical Provider, MD  metoprolol succinate (TOPROL-XL) 25 MG 24 hr tablet TAKE ONE TABLET BY MOUTH EVERY DAY 07/15/15  Yes Kathrine Haddock, NP  ranitidine (ZANTAC) 150 MG tablet Take 150 mg by mouth 2 (two) times daily.   Yes Historical Provider, MD  sertraline (ZOLOFT) 100 MG tablet TAKE ONE TABLET BY MOUTH EVERY MORNING 06/20/15  Yes Kathrine Haddock, NP  simvastatin (ZOCOR) 40 MG tablet TAKE 1 TABLET BY MOUTH EVERY NIGHT AT BEDTIME 07/15/15  Yes Kathrine Haddock, NP  Specialty Vitamins Products (VITA-RX DIABETIC VITAMIN) CAPS Take 1 packet by mouth daily. Diabetic Vitamins - 6 or 7 in 1 pack   Yes Historical Provider, MD  VICTOZA 18 MG/3ML SOPN INJECT 1.2 MG UNDER THE SKIN EVERY DAY 07/27/15  Yes Kathrine Haddock, NP      VITAL SIGNS:  Blood pressure 120/70, pulse 93, temperature 98.1 F (36.7 C), temperature source Oral, resp. rate 26, height 5' (1.524 m), weight 68.947 kg (152 lb), SpO2 96 %.  PHYSICAL EXAMINATION:  GENERAL:  65 y.o.-year-old patient lying in the bed with no acute distress. obese EYES: Pupils equal, round, reactive to light and accommodation. No scleral icterus. Extraocular muscles intact.  HEENT: Head atraumatic, normocephalic. Oropharynx and nasopharynx clear.  NECK:  Supple, no jugular venous distention. No thyroid enlargement, no  tenderness.  LUNGS: Normal breath sounds bilaterally, no wheezing, rales,rhonchi or crepitation. No use of accessory muscles of respiration.  CARDIOVASCULAR: S1, S2 normal. No murmurs, rubs, or gallops.  ABDOMEN: Soft, tender in left LQ, nondistended. Bowel sounds present. No organomegaly or mass.  EXTREMITIES: No pedal edema, cyanosis, or clubbing.  NEUROLOGIC: Cranial nerves II through XII are intact. Muscle strength 5/5 in all extremities. Sensation intact. Gait not checked.  PSYCHIATRIC:  patient is alert and oriented x 3.  SKIN: No obvious rash, lesion, or ulcer.   LABORATORY PANEL:   CBC  Recent Labs Lab 08/12/15 1013  WBC 21.0*  HGB 12.7  HCT 38.5  PLT 460*   ------------------------------------------------------------------------------------------------------------------  Chemistries   Recent Labs Lab 08/12/15 1013  NA 134*  K 4.5  CL 100*  CO2 22  GLUCOSE 161*  BUN 18  CREATININE 1.21*  CALCIUM 9.5  AST 35  ALT 15  ALKPHOS 132*  BILITOT 0.8   ------------------------------------------------------------------------------------------------------------------  Cardiac Enzymes  Recent Labs Lab 08/12/15 1013  TROPONINI 0.05*   ------------------------------------------------------------------------------------------------------------------  RADIOLOGY:  Ct Abdomen Pelvis W Contrast  08/12/2015  CLINICAL DATA:  Left lower abdominal pain and vomiting for the past 2 weeks. EXAM: CT ABDOMEN AND PELVIS WITH CONTRAST TECHNIQUE: Multidetector CT imaging of the abdomen and pelvis was performed using the standard protocol following bolus administration of intravenous contrast. CONTRAST:  177mL ISOVUE-300 IOPAMIDOL (ISOVUE-300) INJECTION 61% COMPARISON:  03/14/2011. FINDINGS: Lower chest: Minimal right lower lobe dependent atelectasis. Minimal cylindrical bronchiectasis in the lingula. Hepatobiliary: Small area of focal fat deposition in the medial segment of the left  lobe of the liver adjacent to the falciform ligament. Tiny probable cyst in the posterior segment of the right lobe of the liver. These are not seen on the previous noncontrast examination. Cholecystectomy clips. Pancreas: 7 mm rounded cystic area in the tail of the pancreas, confirmed on the coronal and sagittal reconstruction images. This is difficult to assess for comparison on the previous noncontrast examination. Otherwise, normal appearing pancreas. Spleen: Within normal limits in size and appearance. Adrenals/Urinary Tract: The previously described small angiomyolipoma in the lower pole of the right kidney has not changed significantly, measuring 9 x 6 mm on image number 44 of series 2. The previous noncontrast examination clearly demonstrated macroscopic fat within this mass. Interval visualization of a 5 mm minimally exophytic low density mass or a complicated cyst in the medial aspect of the upper pole of the left kidney. Normal appearing adrenal glands, ureters and urinary bladder. Stomach/Bowel: Diffuse medium to low density wall thickening involving the proximal and mid portions of the sigmoid colon. Multiple diverticula in that region. There is also a left lower quadrant anterior abdominal wall and peritoneal fluid and gas collection, measuring 4.5 x 2.1 cm on image number 49 of series 2, with surrounding soft tissue edema. This is adjacent to the proximal sigmoid colon with wall thickening and diverticula. There is a low to medium density communication between these 2 areas, best seen on the sagittal images, including image number 126. The gas and fluid collection measures 4.5 cm in length on sagittal image number 130. Small sliding hiatal hernia. Unremarkable small bowel. Surgically absent appendix. Vascular/Lymphatic: Mild atheromatous arterial calcifications. No enlarged lymph nodes. Reproductive: Surgically absent uterus.  Normal appearing ovaries. Other: None. Musculoskeletal: Mild lumbar and  lower thoracic spine degenerative changes. IMPRESSION: 1. Proximal and mid sigmoid colon diverticulosis and diverticulitis with a proximal sigmoid colon para colonic diverticular abscess containing fluid and gas, 4.5 x 4.5 x 2.1 cm. This is involving the left anterior abdominal wall. 2. Small sliding hiatal hernia. 3. 5 mm medium to low density mass or mildly complicated cyst in the upper pole of the left kidney. This could be further evaluated with pre and postcontrast magnetic resonance imaging of the kidneys. 4. Stable lower pole right kidney angiomyolipoma. Electronically Signed   By: Claudie Revering M.D.   On: 08/12/2015 12:50    IMPRESSION AND PLAN:  Kara Leach  is a 65 y.o. female with a known history of Hypertension, diabetes type 2 insulin requiring, osteoarthritis, hyperlipidemia comes to the emergency room with complaints of intractable nausea and vomiting and left lower quadrant abdominal pain. Patient was seen in the primary care's office and found to have elevated white count of 19,000 and had low blood pressure.  1. Sepsis due to sigmoid colon diverticulitis and proximal sigmoid diverticular abscess -Patient presented with left lower quadrant abdominal pain, tachycardia, elevated white count and  abdominal CT scan suggestive of ventricular abscess -Admit to medical floor -Clear liquid diet -IV fluids -IV Zosyn pharmacy to dose -Monitor CBC count -Surgical consultation. Patient was seen by surgery in the emergency room. They recommended interventional radiology to try to do CT-guided drainage of the abscess.  2. Acute on chronic renal failure -Continue IV fluids, monitor I's and O's, avoid nephrotoxins  3. Type 2 diabetes, insulin-requiring -Sliding scale and give Levemir 25 units daily to keep her sugars under control in the setting of #1 -We will hold off on NovoLog 3 times a day, victoza  4. Hypertension -Continue amlodipine and metoprolol -Hold enalapril given elevated  creatinine  5. Hyperlipidemia on statins  6. DVT prophylaxis subcutaneous Lovenox   All the records are reviewed and case discussed with ED provider. Management plans discussed with the patient, family and they are in agreement.  CODE STATUS: full  TOTAL TIME TAKING CARE OF THIS PATIENT: 45 minutes.    Cariah Salatino M.D on 08/12/2015 at 2:06 PM  Between 7am to 6pm - Pager - [phone]  After 6pm go to [email or web address] - password [email or web address] [facility]  Office  [phone]  CC: Primary care physician; Kathrine Haddock, NP

## 2015-08-12 NOTE — ED Provider Notes (Signed)
[facility] Emergency Department Provider Note   ____________________________________________  Time seen: ~1030  I have reviewed the triage vital signs and the nursing notes.   HISTORY  Chief Complaint Abdominal Pain and Emesis   History limited by: Not Limited   HPI Kara Leach is a 65 y.o. female who presents to the emergency department today from primary care doctor's office because of concern for left lower quadrant pain. The patient states that the pain started a little over a week ago. It has gradually been getting worse. It is always been located in the left lower quadrant. Has been associated with some nausea and decreased appetite. The patient has had fevers on and off. She was noted to be tachycardic and have a leukocytosis at the office.   Past Medical History  Diagnosis Date  . Diabetes mellitus without complication ([place])   . Hyperlipidemia   . Hypertension   . Depression   . Chronic kidney disease   . Arthritis     Osteoarthritis  . GERD (gastroesophageal reflux disease)   . Allergy   . Obesity   . Last menstrual period (LMP) > 10 days ago 1986    Patient Active Problem List   Diagnosis Date Noted  . Controlled type 2 diabetes mellitus with chronic kidney disease, with long-term current use of insulin ([place]) 08/12/2015  . Depression 11/04/2014  . Allergic rhinitis 11/04/2014  . Hypertensive CKD (chronic kidney disease) 11/04/2014  . Obesity 08/15/2014  . CKD (chronic kidney disease) stage 2, GFR 60-89 ml/min 08/15/2014  . Hyperlipidemia 08/12/2014  . HTN (hypertension) 08/12/2014    Past Surgical History  Procedure Laterality Date  . Appendectomy    . Abdominal hysterectomy    . Wrist fracture surgery Left     Steel plate  . Patella fracture surgery Left     Current Outpatient Rx  Name  Route  Sig  Dispense  Refill  . allopurinol (ZYLOPRIM) 300 MG tablet      TAKE ONE TABLET BY MOUTH EVERY DAY   30 tablet   0    . amLODipine (NORVASC) 5 MG tablet      TAKE 1 TABLET BY MOUTH EVERY DAY   90 tablet   0   . aspirin EC 81 MG tablet   Oral   Take 81 mg by mouth daily.         . Calcium Carb-Cholecalciferol (CALCIUM 600 + D PO)   Oral   Take 1 tablet by mouth 2 (two) times daily.         . dorzolamide-timolol (COSOPT) 22.3-6.8 MG/ML ophthalmic solution   Both Eyes   Place 1 drop into both eyes 2 (two) times daily.         . enalapril (VASOTEC) 2.5 MG tablet      TAKE ONE TABLET BY MOUTH EVERY DAY   90 tablet   0   . ferrous fumarate (HEMOCYTE - 106 MG FE) 325 (106 FE) MG TABS tablet   Oral   Take 1 tablet by mouth daily.         . fluticasone (FLONASE) 50 MCG/ACT nasal spray   Each Nare   Place 2 sprays into both nostrils daily.         Marland Kitchen glucose blood test strip   Other   1 each by Other route 3 (three) times daily. Use as instructed   100 each   3   . insulin aspart (NOVOLOG) 100 UNIT/ML FlexPen  Subcutaneous   Inject 15 Units into the skin 3 (three) times daily with meals.   15 mL   11   . Insulin Pen Needle 32G X 6 MM MISC   Does not apply   by Does not apply route 3 (three) times daily.         Marland Kitchen LEVEMIR FLEXTOUCH 100 UNIT/ML Pen      INJECT 60 UNITS UNDER THE SKIN EVERY DAY AT 10PM   15 mL   0   . loperamide (IMODIUM) 2 MG capsule   Oral   Take 4 mg by mouth as needed for diarrhea or loose stools.         . metoprolol succinate (TOPROL-XL) 25 MG 24 hr tablet      TAKE ONE TABLET BY MOUTH EVERY DAY   30 tablet   0   . ranitidine (ZANTAC) 150 MG tablet   Oral   Take 150 mg by mouth 2 (two) times daily.         . sertraline (ZOLOFT) 100 MG tablet      TAKE ONE TABLET BY MOUTH EVERY MORNING   90 tablet   0   . simvastatin (ZOCOR) 40 MG tablet      TAKE 1 TABLET BY MOUTH EVERY NIGHT AT BEDTIME   90 tablet   0   . Specialty Vitamins Products (VITA-RX DIABETIC VITAMIN) CAPS   Oral   Take 1 packet by mouth daily. Diabetic Vitamins -  6 or 7 in 1 pack         . VICTOZA 18 MG/3ML SOPN      INJECT 1.2 MG UNDER THE SKIN EVERY DAY   6 mL   0     Allergies Ibuprofen and Strawberry extract  Family History  Problem Relation Age of Onset  . Diabetes Mother   . Heart disease Mother   . Hypertension Mother   . Diabetes Sister   . Cancer Father     stomach  . Arthritis Sister   . Stroke Sister   . Hypertension Sister     Social History Social History  Substance Use Topics  . Smoking status: Never Smoker   . Smokeless tobacco: Never Used  . Alcohol Use: 0.0 oz/week    0 Standard drinks or equivalent per week     Comment: occasionally holiday, weddings    Review of Systems  Constitutional: Negative for fever. Cardiovascular: Negative for chest pain. Respiratory: Negative for shortness of breath. Gastrointestinal: Positive for left lower quadrant pain Neurological: Negative for headaches, focal weakness or numbness.  10-point ROS otherwise negative.  ____________________________________________   PHYSICAL EXAM:  VITAL SIGNS: ED Triage Vitals  Enc Vitals Group     BP 08/12/15 1006 130/68 mmHg     Pulse Rate 08/12/15 1006 109     Resp 08/12/15 1006 18     Temp 08/12/15 1006 98.1 F (36.7 C)     Temp Source 08/12/15 1006 Oral     SpO2 08/12/15 1006 98 %     Weight 08/12/15 1006 152 lb (68.947 kg)     Height 08/12/15 1006 5' (1.524 m)     Head Cir --      Peak Flow --      Pain Score 08/12/15 1008 6   Constitutional: Alert and oriented. Well appearing and in no distress. Eyes: Conjunctivae are normal. PERRL. Normal extraocular movements. ENT   Head: Normocephalic and atraumatic.   Nose: No congestion/rhinnorhea.   Mouth/Throat: Mucous  membranes are moist.   Neck: No stridor. Hematological/Lymphatic/Immunilogical: No cervical lymphadenopathy. Cardiovascular: Normal rate, regular rhythm.  No murmurs, rubs, or gallops. Respiratory: Normal respiratory effort without tachypnea nor  retractions. Breath sounds are clear and equal bilaterally. No wheezes/rales/rhonchi. Gastrointestinal: Soft and moderately tender in the left lower quadrant. No distention. There is no CVA tenderness. Genitourinary: Deferred Musculoskeletal: Normal range of motion in all extremities. No joint effusions.  No lower extremity tenderness nor edema. Neurologic:  Normal speech and language. No gross focal neurologic deficits are appreciated.  Skin:  Skin is warm, dry and intact. No rash noted. Psychiatric: Mood and affect are normal. Speech and behavior are normal. Patient exhibits appropriate insight and judgment.  ____________________________________________    LABS (pertinent positives/negatives)  Labs Reviewed  COMPREHENSIVE METABOLIC PANEL - Abnormal; Notable for the following:    Sodium 134 (*)    Chloride 100 (*)    Glucose, Bld 161 (*)    Creatinine, Ser 1.21 (*)    Total Protein 8.4 (*)    Alkaline Phosphatase 132 (*)    GFR calc non Af Amer 47 (*)    GFR calc Af Amer 54 (*)    All other components within normal limits  CBC - Abnormal; Notable for the following:    WBC 21.0 (*)    RDW 15.0 (*)    Platelets 460 (*)    All other components within normal limits  URINALYSIS COMPLETEWITH MICROSCOPIC ([facility] ONLY) - Abnormal; Notable for the following:    Color, Urine STRAW (*)    APPearance CLEAR (*)    Leukocytes, UA 2+ (*)    Bacteria, UA RARE (*)    Squamous Epithelial / LPF 0-5 (*)    All other components within normal limits  TROPONIN I - Abnormal; Notable for the following:    Troponin I 0.05 (*)    All other components within normal limits  GLUCOSE, CAPILLARY - Abnormal; Notable for the following:    Glucose-Capillary 102 (*)    All other components within normal limits  CULTURE, BLOOD (ROUTINE X 2)  CULTURE, BLOOD (ROUTINE X 2)  URINE CULTURE  LIPASE, BLOOD  LACTIC ACID, PLASMA      ____________________________________________   EKG  None  ____________________________________________    RADIOLOGY  CT abd/pel IMPRESSION: 1. Proximal and mid sigmoid colon diverticulosis and diverticulitis with a proximal sigmoid colon para colonic diverticular abscess containing fluid and gas, 4.5 x 4.5 x 2.1 cm. This is involving the left anterior abdominal wall. 2. Small sliding hiatal hernia. 3. 5 mm medium to low density mass or mildly complicated cyst in the upper pole of the left kidney. This could be further evaluated with pre and postcontrast magnetic resonance imaging of the kidneys. 4. Stable lower pole right kidney angiomyolipoma.   ____________________________________________   PROCEDURES  Procedure(s) performed: None  Critical Care performed: No  ____________________________________________   INITIAL IMPRESSION / ASSESSMENT AND PLAN / ED COURSE  Pertinent labs & imaging results that were available during my care of the patient were reviewed by me and considered in my medical decision making (see chart for details).  She presents from primary care doctor's office because of concerns for left lower quadrant pain, nausea and dehydration. Exam she does have some tenderness to left lower quadrant. Patient has had a fairly significant leukocytosis. Will get a CT scan to evaluate for possible diverticulitis and to eval for complications.  CT scan didn't show diverticular abscess. Will plan on admission to hospital service.  ____________________________________________   FINAL CLINICAL IMPRESSION(S) / ED DIAGNOSES  Final diagnoses:  Colonic diverticular abscess     Note: This dictation was prepared with Dragon dictation. Any transcriptional errors that result from this process are unintentional    Nance Pear, MD 08/12/15 [phone]

## 2015-08-12 NOTE — Progress Notes (Signed)
BP 110/78 mmHg  Pulse 114  Temp(Src) 98 F (36.7 C)  Ht 4' 10.3" (1.481 m)  Wt 152 lb 3.2 oz (69.037 kg)  BMI 31.48 kg/m2  SpO2 97%  LMP  (LMP Unknown)   Subjective:    Patient ID: Kara Leach Parents, female    DOB: June 01, 1950, 65 y.o.   MRN: [id number]  HPI: Kara Leach is a 65 y.o. female  Chief Complaint  Patient presents with  . Diabetes  . Hyperlipidemia  . Hypertension  . URI    pt states she has had a alot of congestion since last Monday, states she has also been throwing up since last Monday also  . Abdominal Pain    pt states she has had pain in left lower abdomen since last Monday as well   Diabetes: 60 units of Levimir and 16 units of Novolog with meals Using medications without difficulties No hypoglycemic episodes No hyperglycemic episodes Feet problems: none Blood Sugars averaging: High right now as not feeling well eye exam within last year: yes Last Hgb A1C:7.4  Hypertension  Using medications without difficulty Average home BPs Not check  Using medication without problems or lightheadedness No chest pain with exertion or shortness of breath No Edema  Elevated Cholesterol Using medications without problems No Muscle aches  Diet: "trying to" Exercise:"trying to"  Abdominal pain x 4 days States she is vomiting and "living on" soup, gingerale, and jello.  She is able to keep those down but noting more solid.  She is having left sided abdominal pain.  States pain is constant and sometime very sharp cramping.      Relevant past medical, surgical, family and social history reviewed and updated as indicated. Interim medical history since our last visit reviewed. Allergies and medications reviewed and updated.  Review of Systems  Per HPI unless specifically indicated above     Objective:    BP 110/78 mmHg  Pulse 114  Temp(Src) 98 F (36.7 C)  Ht 4' 10.3" (1.481 m)  Wt 152 lb 3.2 oz (69.037 kg)  BMI 31.48 kg/m2  SpO2 97%  LMP  (LMP  Unknown)  Wt Readings from Last 3 Encounters:  08/12/15 152 lb 3.2 oz (69.037 kg)  07/06/15 163 lb 5.8 oz (74.1 kg)  05/11/15 162 lb 3.2 oz (73.573 kg)    Physical Exam  Constitutional: She is oriented to person, place, and time. She appears well-developed and well-nourished. No distress.  HENT:  Head: Normocephalic and atraumatic.  Eyes: Conjunctivae and lids are normal. Right eye exhibits no discharge. Left eye exhibits no discharge. No scleral icterus.  Neck: Normal range of motion. Neck supple. No JVD present. Carotid bruit is not present.  Cardiovascular: Normal rate, regular rhythm and normal heart sounds.   Pulmonary/Chest: Effort normal and breath sounds normal.  Abdominal: Normal appearance. There is no splenomegaly or hepatomegaly. There is tenderness in the left lower quadrant. There is guarding.  Musculoskeletal: Normal range of motion.  Neurological: She is alert and oriented to person, place, and time.  Skin: Skin is warm, dry and intact. No rash noted. No pallor.  Psychiatric: She has a normal mood and affect. Her behavior is normal. Judgment and thought content normal.   Assessment & Plan:   Problem List Items Addressed This Visit      Unprioritized   Controlled type 2 diabetes mellitus with chronic kidney disease, with long-term current use of insulin ([place]) - Primary    Hgb A1C is 7.6 and  well controlled      Relevant Orders   Bayer DCA Hb A1c Waived   Lipid Panel w/o Chol/HDL Ratio   Comprehensive metabolic panel   HTN (hypertension)    Orthostatic when standing      Hyperlipidemia    Other Visit Diagnoses    Routine general medical examination at a health care facility        Relevant Orders    HIV antibody    Hepatitis C antibody    LLQ abdominal pain        WBC is 19K and symptomatic.  She is orthostatic and unable to eat.  Will refer to the ER for further evaluation of probable diverticulitis    Relevant Orders    CBC With Differential/Platelet     UA/M w/rflx Culture, Routine       Discussed pt and plan of care with Dr. Wynetta Emery  Follow up plan: Return for In the ER.

## 2015-08-12 NOTE — ED Notes (Signed)
Patient transported to interventional radiology 

## 2015-08-12 NOTE — Progress Notes (Signed)
Pt arrived on unit, VSS, dressing from procedure is dry and intact. Cardiac monitoring was placed. Second verficiation for skin assessment was completed with Ledell Noss, RN.   Kara Leach

## 2015-08-12 NOTE — Procedures (Signed)
CT guided drain placement in small left abdominal abscess.  Removed 5 ml of purulent fluid and gas.  No immediate complication.

## 2015-08-12 NOTE — Consult Note (Signed)
Patient ID: Kara Leach, female   DOB: 12-01-1950, 65 y.o.   MRN: [id number]  CC: ABDOMINAL PAIN  HPI Kara Leach is a 65 y.o. female who presents emergency department with a multi-history of worsening left lower quadrant abdominal pain, nausea, vomiting. Patient went to her primary care physician for evaluation and was found to have a white blood cell count of 19,000 and told to report to the emergency department for further workup. Patient reports never had anything like this before. Nothing has made it better or worse since it started. She is otherwise in a usual state of health with her multiple chronic medical problems.  HPI  Past Medical History  Diagnosis Date  . Diabetes mellitus without complication ([place])   . Hyperlipidemia   . Hypertension   . Depression   . Chronic kidney disease   . Arthritis     Osteoarthritis  . GERD (gastroesophageal reflux disease)   . Allergy   . Obesity   . Last menstrual period (LMP) > 10 days ago 1986  . Renal insufficiency     Past Surgical History  Procedure Laterality Date  . Appendectomy    . Abdominal hysterectomy    . Wrist fracture surgery Left     Steel plate  . Patella fracture surgery Left     Family History  Problem Relation Age of Onset  . Diabetes Mother   . Heart disease Mother   . Hypertension Mother   . Diabetes Sister   . Cancer Father     stomach  . Arthritis Sister   . Stroke Sister   . Hypertension Sister     Social History Social History  Substance Use Topics  . Smoking status: Never Smoker   . Smokeless tobacco: Never Used  . Alcohol Use: 0.0 oz/week    0 Standard drinks or equivalent per week     Comment: occasionally holiday, weddings    Allergies  Allergen Reactions  . Ibuprofen Other (See Comments)    "ringing in ears"  . Strawberry Extract Swelling    Lips swell and rash over body    Current Facility-Administered Medications  Medication Dose Route Frequency Provider Last Rate Last  Dose  . insulin aspart (novoLOG) injection 0-9 Units  0-9 Units Subcutaneous TID WC Fritzi Mandes, MD      . insulin detemir (LEVEMIR) injection 25 Units  25 Units Subcutaneous Daily Fritzi Mandes, MD       Current Outpatient Prescriptions  Medication Sig Dispense Refill  . allopurinol (ZYLOPRIM) 300 MG tablet TAKE ONE TABLET BY MOUTH EVERY DAY 30 tablet 0  . amLODipine (NORVASC) 5 MG tablet TAKE 1 TABLET BY MOUTH EVERY DAY 90 tablet 0  . aspirin EC 81 MG tablet Take 81 mg by mouth daily.    . Calcium Carb-Cholecalciferol (CALCIUM 600 + D PO) Take 1 tablet by mouth 2 (two) times daily.    . dorzolamide-timolol (COSOPT) 22.3-6.8 MG/ML ophthalmic solution Place 1 drop into both eyes 2 (two) times daily.    . enalapril (VASOTEC) 2.5 MG tablet TAKE ONE TABLET BY MOUTH EVERY DAY 90 tablet 0  . fluticasone (FLONASE) 50 MCG/ACT nasal spray Place 2 sprays into both nostrils daily as needed for allergies.     Marland Kitchen insulin aspart (NOVOLOG) 100 UNIT/ML FlexPen Inject 15 Units into the skin 3 (three) times daily with meals. 15 mL 11  . LEVEMIR FLEXTOUCH 100 UNIT/ML Pen INJECT 60 UNITS UNDER THE SKIN EVERY DAY AT 10PM 15 mL  0  . loperamide (IMODIUM) 2 MG capsule Take 4 mg by mouth as needed for diarrhea or loose stools.    . metoprolol succinate (TOPROL-XL) 25 MG 24 hr tablet TAKE ONE TABLET BY MOUTH EVERY DAY 30 tablet 0  . ranitidine (ZANTAC) 150 MG tablet Take 150 mg by mouth 2 (two) times daily.    . sertraline (ZOLOFT) 100 MG tablet TAKE ONE TABLET BY MOUTH EVERY MORNING 90 tablet 0  . simvastatin (ZOCOR) 40 MG tablet TAKE 1 TABLET BY MOUTH EVERY NIGHT AT BEDTIME 90 tablet 0  . Specialty Vitamins Products (VITA-RX DIABETIC VITAMIN) CAPS Take 1 packet by mouth daily. Diabetic Vitamins - 6 or 7 in 1 pack    . VICTOZA 18 MG/3ML SOPN INJECT 1.2 MG UNDER THE SKIN EVERY DAY 6 mL 0     Review of Systems A Multi-point review of systems was asked and was negative except for the findings documented in the history of  present illness*  Physical Exam Blood pressure 123/67, pulse 86, temperature 98.1 F (36.7 C), temperature source Oral, resp. rate 18, height 5' (1.524 m), weight 68.947 kg (152 lb), SpO2 99 %. CONSTITUTIONAL: No acute distress. EYES: Pupils are equal, round, and reactive to light, Sclera are non-icteric. EARS, NOSE, MOUTH AND THROAT: The oropharynx is clear. The oral mucosa is pink and moist. Hearing is intact to voice. LYMPH NODES:  Lymph nodes in the neck are normal. RESPIRATORY:  Lungs are clear. There is normal respiratory effort, with equal breath sounds bilaterally, and without pathologic use of accessory muscles. CARDIOVASCULAR: Heart is regular without murmurs, gallops, or rubs. GI: The abdomen is soft, acutely tender to palpation in the left lower quadrant but without any guarding or overt peritoneal signs, and nondistended. There are no palpable masses. There is no hepatosplenomegaly. There are normal bowel sounds in all quadrants. GU: Rectal deferred.   MUSCULOSKELETAL: Normal muscle strength and tone. No cyanosis or edema.   SKIN: Turgor is good and there are no pathologic skin lesions or ulcers. NEUROLOGIC: Motor and sensation is grossly normal. Cranial nerves are grossly intact. PSYCH:  Oriented to person, place and time. Affect is normal.  Data Reviewed Images and labs reviewed. Labs concerning for leukocytosis of 21,000. CT scan reviewed which does show a pericolonic abscess adjacent to the left anterior abdominal wall. There is no obvious free air or uninvolved free fluid. I have personally reviewed the patient's imaging, laboratory findings and medical records.    Assessment    Diverticular abscess    Plan    65 year old female with multiple medical problems and an acute diverticular abscess. The optimal treatment for this is IV antibiotics and percutaneous drainage via IR guidance. Surgery will assist with drain management and patient will need close surgical follow  up. Should she fail to improve on IV antibiotics and percutaneous drainage she may require urgent operation which would likely involve an ostomy. This was discussed with the patient in detail voiced understanding. Given the patient's multiple medical problems agree with admitting to the internal medicine service. Surgery will follow with you to ensure patient's resolution. Once patient is pain-free can start a diet and oral antibiotics for approximately 2 week course.     Time spent with the patient was 30 minutes, with more than 50% of the time spent in face-to-face education, counseling and care coordination.     Clayburn Pert, MD FACS General Surgeon 08/12/2015, 2:29 PM

## 2015-08-12 NOTE — Progress Notes (Signed)
Pharmacy Antibiotic Note  Kara Leach is a 65 y.o. female admitted on 08/12/2015 with intra-abdominal infection.  Pharmacy has been consulted for piperacillin/tazobactam dosing.   Patient was diagnosed with diverticulitis in PCP office. Code sepsis was called in ED.   Plan: Piperacillin/tazobactam 3.375 g IV q8h EI  Height: 5' (152.4 cm) Weight: 152 lb (68.947 kg) IBW/kg (Calculated) : 45.5  Temp (24hrs), Avg:98.1 F (36.7 C), Min:98 F (36.7 C), Max:98.1 F (36.7 C)   Recent Labs Lab 08/12/15 0848 08/12/15 1013  WBC 19.0* 21.0*  CREATININE  --  1.21*    Estimated Creatinine Clearance: 41.8 mL/min (by C-G formula based on Cr of 1.21).    Allergies  Allergen Reactions  . Ibuprofen Other (See Comments)    "ringing in ears"  . Strawberry Extract Swelling    Lips swell and rash over body   Antimicrobials this admission: Piperacillin/tazobactam 5/26 >>   Dose adjustments this admission:  Microbiology results: 5/26 BCx: Sent 5/23 UCx: Sent   Thank you for allowing pharmacy to be a part of this patient's care.  Lenis Noon, PharmD Clinical Pharmacist 08/12/2015 1:55 PM

## 2015-08-12 NOTE — Assessment & Plan Note (Signed)
Orthostatic when standing

## 2015-08-12 NOTE — Addendum Note (Signed)
Addended by: Moss Mc on: 08/12/2015 09:53 AM   Modules accepted: Orders

## 2015-08-12 NOTE — ED Notes (Signed)
Patient presents to the ED from PCP office with a diagnosis of diverticulitis.  Patient reports left lower abdominal pain with vomiting x 2 weeks.  Patient's PCP told patient she was probably dehydrated and the diverticulitis may be difficult to treat at home due to amount of vomiting.  Patient is alert and oriented x 3.  Skin is warm and dry.  Patient appears slightly pale.  Ambulatory to triage without obvious difficulty.  Mucous membranes appear dry.

## 2015-08-12 NOTE — ED Notes (Signed)
Called code sepsis to [place]

## 2015-08-12 NOTE — Assessment & Plan Note (Signed)
Hgb A1C is 7.6 and well controlled

## 2015-08-12 NOTE — ED Notes (Signed)
Patient transported to CT 

## 2015-08-12 NOTE — H&P (Signed)
Chief Complaint: Patient was seen in consultation today for  Chief Complaint  Patient presents with  . Abdominal Pain  . Emesis     Referring Physician:  Dr. Fritzi Mandes.  History of Present Illness: Kara Leach is a 65 y.o. female with abdominal pain and elevated WBC.  CT scan demonstrated an abscess in left abdomen that appears to be involving the abdominal wall.  Findings are suggestive for acute diverticulitis with abscess.  Patient has been evaluated by Surgery.  Patient's main complaint in left lower quadrant pain. She has been hurting for multiple days and vomited this morning.  Past Medical History  Diagnosis Date  . Diabetes mellitus without complication ([place])   . Hyperlipidemia   . Hypertension   . Depression   . Chronic kidney disease   . Arthritis     Osteoarthritis  . GERD (gastroesophageal reflux disease)   . Allergy   . Obesity   . Last menstrual period (LMP) > 10 days ago 1986  . Renal insufficiency     Past Surgical History  Procedure Laterality Date  . Appendectomy    . Abdominal hysterectomy    . Wrist fracture surgery Left     Steel plate  . Patella fracture surgery Left     Allergies: Ibuprofen and Strawberry extract  Medications: Prior to Admission medications   Medication Sig Start Date End Date Taking? Authorizing Provider  allopurinol (ZYLOPRIM) 300 MG tablet TAKE ONE TABLET BY MOUTH EVERY DAY 07/15/15  Yes Kathrine Haddock, NP  amLODipine (NORVASC) 5 MG tablet TAKE 1 TABLET BY MOUTH EVERY DAY 07/15/15  Yes Kathrine Haddock, NP  aspirin EC 81 MG tablet Take 81 mg by mouth daily.   Yes Historical Provider, MD  Calcium Carb-Cholecalciferol (CALCIUM 600 + D PO) Take 1 tablet by mouth 2 (two) times daily.   Yes Historical Provider, MD  dorzolamide-timolol (COSOPT) 22.3-6.8 MG/ML ophthalmic solution Place 1 drop into both eyes 2 (two) times daily.   Yes Historical Provider, MD  enalapril (VASOTEC) 2.5 MG tablet TAKE ONE TABLET BY MOUTH EVERY DAY  05/16/15  Yes Kathrine Haddock, NP  fluticasone (FLONASE) 50 MCG/ACT nasal spray Place 2 sprays into both nostrils daily as needed for allergies.    Yes Historical Provider, MD  insulin aspart (NOVOLOG) 100 UNIT/ML FlexPen Inject 15 Units into the skin 3 (three) times daily with meals. 04/19/15  Yes Kathrine Haddock, NP  LEVEMIR FLEXTOUCH 100 UNIT/ML Pen INJECT 60 UNITS UNDER THE SKIN EVERY DAY AT 10PM 07/15/15  Yes Kathrine Haddock, NP  loperamide (IMODIUM) 2 MG capsule Take 4 mg by mouth as needed for diarrhea or loose stools.   Yes Historical Provider, MD  metoprolol succinate (TOPROL-XL) 25 MG 24 hr tablet TAKE ONE TABLET BY MOUTH EVERY DAY 07/15/15  Yes Kathrine Haddock, NP  ranitidine (ZANTAC) 150 MG tablet Take 150 mg by mouth 2 (two) times daily.   Yes Historical Provider, MD  sertraline (ZOLOFT) 100 MG tablet TAKE ONE TABLET BY MOUTH EVERY MORNING 06/20/15  Yes Kathrine Haddock, NP  simvastatin (ZOCOR) 40 MG tablet TAKE 1 TABLET BY MOUTH EVERY NIGHT AT BEDTIME 07/15/15  Yes Kathrine Haddock, NP  Specialty Vitamins Products (VITA-RX DIABETIC VITAMIN) CAPS Take 1 packet by mouth daily. Diabetic Vitamins - 6 or 7 in 1 pack   Yes Historical Provider, MD  VICTOZA 18 MG/3ML SOPN INJECT 1.2 MG UNDER THE SKIN EVERY DAY 07/27/15  Yes Kathrine Haddock, NP     Family History  Problem Relation Age of Onset  . Diabetes Mother   . Heart disease Mother   . Hypertension Mother   . Diabetes Sister   . Cancer Father     stomach  . Arthritis Sister   . Stroke Sister   . Hypertension Sister     Social History   Social History  . Marital Status: Widowed    Spouse Name: N/A  . Number of Children: N/A  . Years of Education: N/A   Social History Main Topics  . Smoking status: Never Smoker   . Smokeless tobacco: Never Used  . Alcohol Use: 0.0 oz/week    0 Standard drinks or equivalent per week     Comment: occasionally holiday, weddings  . Drug Use: No  . Sexual Activity: No   Other Topics Concern  . None   Social  History Narrative    Review of Systems  Respiratory: Negative.   Cardiovascular: Negative.   Gastrointestinal: Positive for vomiting and abdominal pain.    Vital Signs: BP 123/67 mmHg  Pulse 88  Temp(Src) 98.1 F (36.7 C) (Oral)  Resp 19  Ht 5' (1.524 m)  Wt 152 lb (68.947 kg)  BMI 29.69 kg/m2  SpO2 97%  LMP  (LMP Unknown)  Physical Exam  Cardiovascular: Normal rate, regular rhythm and normal heart sounds.   Pulmonary/Chest: Effort normal and breath sounds normal.  Abdominal: Soft. Bowel sounds are normal. There is tenderness.  Tenderness in left lower quadrant.    Mallampati Score:  MD Evaluation Airway: WNL Heart: WNL Abdomen: Other (comments) Abdomen comments: left lower abdominal pain Chest/ Lungs: WNL ASA  Classification: 2 Mallampati/Airway Score: Two  Imaging: Ct Abdomen Pelvis W Contrast  08/12/2015  ADDENDUM REPORT: 08/12/2015 15:42 ADDENDUM: Number 5 under Impression should read as follows: 7 mm cystic lesion in the tail of the pancreas. This could represent a small cystic pancreatic neoplasm. This could be further evaluated with a pre and postcontrast magnetic resonance imaging examination of the pancreas. Electronically Signed   By: Claudie Revering M.D.   On: 08/12/2015 15:42  08/12/2015  CLINICAL DATA:  Left lower abdominal pain and vomiting for the past 2 weeks. EXAM: CT ABDOMEN AND PELVIS WITH CONTRAST TECHNIQUE: Multidetector CT imaging of the abdomen and pelvis was performed using the standard protocol following bolus administration of intravenous contrast. CONTRAST:  139mL ISOVUE-300 IOPAMIDOL (ISOVUE-300) INJECTION 61% COMPARISON:  03/14/2011. FINDINGS: Lower chest: Minimal right lower lobe dependent atelectasis. Minimal cylindrical bronchiectasis in the lingula. Hepatobiliary: Small area of focal fat deposition in the medial segment of the left lobe of the liver adjacent to the falciform ligament. Tiny probable cyst in the posterior segment of the right lobe  of the liver. These are not seen on the previous noncontrast examination. Cholecystectomy clips. Pancreas: 7 mm rounded cystic area in the tail of the pancreas, confirmed on the coronal and sagittal reconstruction images. This is difficult to assess for comparison on the previous noncontrast examination. Otherwise, normal appearing pancreas. Spleen: Within normal limits in size and appearance. Adrenals/Urinary Tract: The previously described small angiomyolipoma in the lower pole of the right kidney has not changed significantly, measuring 9 x 6 mm on image number 44 of series 2. The previous noncontrast examination clearly demonstrated macroscopic fat within this mass. Interval visualization of a 5 mm minimally exophytic low density mass or a complicated cyst in the medial aspect of the upper pole of the left kidney. Normal appearing adrenal glands, ureters and urinary bladder. Stomach/Bowel: Diffuse medium  to low density wall thickening involving the proximal and mid portions of the sigmoid colon. Multiple diverticula in that region. There is also a left lower quadrant anterior abdominal wall and peritoneal fluid and gas collection, measuring 4.5 x 2.1 cm on image number 49 of series 2, with surrounding soft tissue edema. This is adjacent to the proximal sigmoid colon with wall thickening and diverticula. There is a low to medium density communication between these 2 areas, best seen on the sagittal images, including image number 126. The gas and fluid collection measures 4.5 cm in length on sagittal image number 130. Small sliding hiatal hernia. Unremarkable small bowel. Surgically absent appendix. Vascular/Lymphatic: Mild atheromatous arterial calcifications. No enlarged lymph nodes. Reproductive: Surgically absent uterus.  Normal appearing ovaries. Other: None. Musculoskeletal: Mild lumbar and lower thoracic spine degenerative changes. IMPRESSION: 1. Proximal and mid sigmoid colon diverticulosis and  diverticulitis with a proximal sigmoid colon para colonic diverticular abscess containing fluid and gas, 4.5 x 4.5 x 2.1 cm. This is involving the left anterior abdominal wall. 2. Small sliding hiatal hernia. 3. 5 mm medium to low density mass or mildly complicated cyst in the upper pole of the left kidney. This could be further evaluated with pre and postcontrast magnetic resonance imaging of the kidneys. 4. Stable lower pole right kidney angiomyolipoma. Electronically Signed: By: Claudie Revering M.D. On: 08/12/2015 12:50    Labs:  CBC:  Recent Labs  12/22/14 1108 06/29/15 1051 08/12/15 0848 08/12/15 1013  WBC 8.3 9.8 19.0* 21.0*  HGB 13.1 13.4  --  12.7  HCT 39.3 39.1 36.4 38.5  PLT 296 305 436* 460*    COAGS: No results for input(s): INR, APTT in the last 8760 hours.  BMP:  Recent Labs  12/22/14 1108 05/11/15 0951 06/29/15 1051 08/12/15 1013  NA 136 141 135 134*  K 4.4 4.7 4.3 4.5  CL 105 101 106 100*  CO2 24 20 22 22   GLUCOSE 172* 171* 131* 161*  BUN 20 20 17 18   CALCIUM 8.7* 9.9 9.5 9.5  CREATININE 1.10* 0.92 1.06* 1.21*  GFRNONAA 53* 67 55* 47*  GFRAA >60 77 >60 54*    LIVER FUNCTION TESTS:  Recent Labs  11/08/14 0904 12/22/14 1108 05/11/15 0951 08/12/15 1013  BILITOT 0.3 0.4 0.2 0.8  AST 44* 40 41* 35  ALT 25 23 19 15   ALKPHOS 113 106 127* 132*  PROT 7.3 8.4* 7.8 8.4*  ALBUMIN 4.5 4.2 4.6 4.0    TUMOR MARKERS: No results for input(s): AFPTM, CEA, CA199, CHROMGRNA in the last 8760 hours.  Assessment and Plan:  65 yo with a diverticular abscess.  Patient is being admitted and already received antibiotics.  Reviewed CT images and patient is a candidate for percutaneous drain placement.  Discussed CT guided drain placement with patient and reviewed the risks.  Informed consent obtained from patient.  Plan for CT guided drainage of left abdominal abscess with moderate sedation.   Thank you for this interesting consult.  I greatly enjoyed meeting DENVER FELTZ and look forward to participating in their care.  A copy of this report was sent to the requesting provider on this date.  Electronically Signed: Carylon Perches 08/12/2015, 4:16 PM   I spent a total of 20 Minutes   in face to face in clinical consultation, greater than 50% of which was counseling/coordinating care for the diverticular abscess.

## 2015-08-13 LAB — CBC
HCT: 30.2 % — ABNORMAL LOW (ref 35.0–47.0)
Hemoglobin: 9.9 g/dL — ABNORMAL LOW (ref 12.0–16.0)
MCH: 30 pg (ref 26.0–34.0)
MCHC: 32.9 g/dL (ref 32.0–36.0)
MCV: 91.1 fL (ref 80.0–100.0)
PLATELETS: 332 10*3/uL (ref 150–440)
RBC: 3.31 MIL/uL — ABNORMAL LOW (ref 3.80–5.20)
RDW: 15.1 % — ABNORMAL HIGH (ref 11.5–14.5)
WBC: 14.3 10*3/uL — ABNORMAL HIGH (ref 3.6–11.0)

## 2015-08-13 LAB — GLUCOSE, CAPILLARY
GLUCOSE-CAPILLARY: 130 mg/dL — AB (ref 65–99)
Glucose-Capillary: 186 mg/dL — ABNORMAL HIGH (ref 65–99)
Glucose-Capillary: 171 mg/dL — ABNORMAL HIGH (ref 65–99)
Glucose-Capillary: 133 mg/dL — ABNORMAL HIGH (ref 65–99)

## 2015-08-13 NOTE — Progress Notes (Signed)
Patient ID: Kara Leach, female   DOB: Mar 25, 1950, 65 y.o.   MRN: [id number] [facility] PROGRESS NOTE  Kara Leach [id number] DOB: [birth year] DOA: 08/12/2015 PCP: Kathrine Haddock, NP  HPI/Subjective: Patient feeling better than yesterday. Still having some pain in the left lower quadrant. Some nausea but no vomiting today.  Objective: Filed Vitals:   08/13/15 0959 08/13/15 1223  BP: 118/56 119/58  Pulse: 99 91  Temp:  98.2 F (36.8 C)  Resp:  20    Filed Weights   08/12/15 1006 08/12/15 1819  Weight: 68.947 kg (152 lb) 73.347 kg (161 lb 11.2 oz)    ROS: Review of Systems  Constitutional: Negative for fever and chills.  Eyes: Negative for blurred vision.  Respiratory: Negative for cough and shortness of breath.   Cardiovascular: Negative for chest pain.  Gastrointestinal: Positive for nausea and abdominal pain. Negative for vomiting, diarrhea and constipation.  Genitourinary: Negative for dysuria.  Musculoskeletal: Negative for joint pain.  Neurological: Negative for dizziness and headaches.   Exam: Physical Exam  Constitutional: She is oriented to person, place, and time.  HENT:  Nose: No mucosal edema.  Mouth/Throat: No oropharyngeal exudate or posterior oropharyngeal edema.  Eyes: Conjunctivae, EOM and lids are normal. Pupils are equal, round, and reactive to light.  Neck: No JVD present. Carotid bruit is not present. No edema present. No thyroid mass and no thyromegaly present.  Cardiovascular: S1 normal and S2 normal.  Exam reveals no gallop.   No murmur heard. Pulses:      Dorsalis pedis pulses are 2+ on the right side, and 2+ on the left side.  Respiratory: No respiratory distress. She has no wheezes. She has no rhonchi. She has no rales.  GI: Soft. Bowel sounds are normal. There is tenderness in the left lower quadrant.  Musculoskeletal:       Right ankle: She exhibits no swelling.       Left ankle: She exhibits no swelling.  Lymphadenopathy:     She has no cervical adenopathy.  Neurological: She is alert and oriented to person, place, and time. No cranial nerve deficit.  Skin: Skin is warm. No rash noted. Nails show no clubbing.  Psychiatric: She has a normal mood and affect.      Data Reviewed: Basic Metabolic Panel:  Recent Labs Lab 08/12/15 0849 08/12/15 1013  NA 136 134*  K 4.1 4.5  CL 96 100*  CO2 21 22  GLUCOSE 153* 161*  BUN 14 18  CREATININE 1.10* 1.21*  CALCIUM 9.4 9.5   Liver Function Tests:  Recent Labs Lab 08/12/15 0849 08/12/15 1013  AST 19 35  ALT 10 15  ALKPHOS 161* 132*  BILITOT 0.4 0.8  PROT 7.2 8.4*  ALBUMIN 3.9 4.0    Recent Labs Lab 08/12/15 1013  LIPASE 48   CBC:  Recent Labs Lab 08/12/15 0848 08/12/15 1013 08/13/15 0615  WBC 19.0* 21.0* 14.3*  NEUTROABS 14.5*  --   --   HGB  --  12.7 9.9*  HCT 36.4 38.5 30.2*  MCV 90 89.7 91.1  PLT 436* 460* 332   Cardiac Enzymes:  Recent Labs Lab 08/12/15 1013  TROPONINI 0.05*   CBG:  Recent Labs Lab 08/12/15 1414 08/12/15 1750 08/12/15 2110 08/13/15 0728 08/13/15 1126  GLUCAP 102* 75 128* 130* 186*    Recent Results (from the past 240 hour(s))  Blood Culture (routine x 2)     Status: None (Preliminary result)   Collection Time: 08/12/15  1:00 PM  Result Value Ref Range Status   Specimen Description BLOOD RIGHT ARM  Final   Special Requests BOTTLES DRAWN AEROBIC AND ANAEROBIC [place]  Final   Culture NO GROWTH < 24 HOURS  Final   Report Status PENDING  Incomplete  Blood Culture (routine x 2)     Status: None (Preliminary result)   Collection Time: 08/12/15  1:20 PM  Result Value Ref Range Status   Specimen Description BLOOD RIGHT ASSIST CONTROL  Final   Special Requests BOTTLES DRAWN AEROBIC AND ANAEROBIC 3CCAERO,3CCANA  Final   Culture NO GROWTH < 24 HOURS  Final   Report Status PENDING  Incomplete  CULTURE, ROUTINE-ABSCESS ([place] ONLY)     Status: None (Preliminary result)   Collection Time:  08/12/15  5:10 PM  Result Value Ref Range Status   Specimen Description ABSCESS  Final   Special Requests Normal  Final   Gram Stain PENDING  Incomplete   Culture   Final    MODERATE GROWTH GRAM NEGATIVE RODS IDENTIFICATION AND SUSCEPTIBILITIES TO FOLLOW    Report Status PENDING  Incomplete     Studies: Ct Abdomen Pelvis W Contrast  08/12/2015  ADDENDUM REPORT: 08/12/2015 15:42 ADDENDUM: Number 5 under Impression should read as follows: 7 mm cystic lesion in the tail of the pancreas. This could represent a small cystic pancreatic neoplasm. This could be further evaluated with a pre and postcontrast magnetic resonance imaging examination of the pancreas. Electronically Signed   By: Claudie Revering M.D.   On: 08/12/2015 15:42  08/12/2015  CLINICAL DATA:  Left lower abdominal pain and vomiting for the past 2 weeks. EXAM: CT ABDOMEN AND PELVIS WITH CONTRAST TECHNIQUE: Multidetector CT imaging of the abdomen and pelvis was performed using the standard protocol following bolus administration of intravenous contrast. CONTRAST:  143mL ISOVUE-300 IOPAMIDOL (ISOVUE-300) INJECTION 61% COMPARISON:  03/14/2011. FINDINGS: Lower chest: Minimal right lower lobe dependent atelectasis. Minimal cylindrical bronchiectasis in the lingula. Hepatobiliary: Small area of focal fat deposition in the medial segment of the left lobe of the liver adjacent to the falciform ligament. Tiny probable cyst in the posterior segment of the right lobe of the liver. These are not seen on the previous noncontrast examination. Cholecystectomy clips. Pancreas: 7 mm rounded cystic area in the tail of the pancreas, confirmed on the coronal and sagittal reconstruction images. This is difficult to assess for comparison on the previous noncontrast examination. Otherwise, normal appearing pancreas. Spleen: Within normal limits in size and appearance. Adrenals/Urinary Tract: The previously described small angiomyolipoma in the lower pole of the right  kidney has not changed significantly, measuring 9 x 6 mm on image number 44 of series 2. The previous noncontrast examination clearly demonstrated macroscopic fat within this mass. Interval visualization of a 5 mm minimally exophytic low density mass or a complicated cyst in the medial aspect of the upper pole of the left kidney. Normal appearing adrenal glands, ureters and urinary bladder. Stomach/Bowel: Diffuse medium to low density wall thickening involving the proximal and mid portions of the sigmoid colon. Multiple diverticula in that region. There is also a left lower quadrant anterior abdominal wall and peritoneal fluid and gas collection, measuring 4.5 x 2.1 cm on image number 49 of series 2, with surrounding soft tissue edema. This is adjacent to the proximal sigmoid colon with wall thickening and diverticula. There is a low to medium density communication between these 2 areas, best seen on the sagittal images, including image number 126. The gas and fluid  collection measures 4.5 cm in length on sagittal image number 130. Small sliding hiatal hernia. Unremarkable small bowel. Surgically absent appendix. Vascular/Lymphatic: Mild atheromatous arterial calcifications. No enlarged lymph nodes. Reproductive: Surgically absent uterus.  Normal appearing ovaries. Other: None. Musculoskeletal: Mild lumbar and lower thoracic spine degenerative changes. IMPRESSION: 1. Proximal and mid sigmoid colon diverticulosis and diverticulitis with a proximal sigmoid colon para colonic diverticular abscess containing fluid and gas, 4.5 x 4.5 x 2.1 cm. This is involving the left anterior abdominal wall. 2. Small sliding hiatal hernia. 3. 5 mm medium to low density mass or mildly complicated cyst in the upper pole of the left kidney. This could be further evaluated with pre and postcontrast magnetic resonance imaging of the kidneys. 4. Stable lower pole right kidney angiomyolipoma. Electronically Signed: By: Claudie Revering M.D. On:  08/12/2015 12:50   Ct Image Guided Fluid Drain By Catheter  08/12/2015  INDICATION: 65 year old with a diverticular abscess in the left abdomen. EXAM: CT-GUIDED DRAINAGE OF LEFT ABDOMINAL ABSCESS MEDICATIONS: The patient is currently admitted to the hospital and receiving intravenous antibiotics. The antibiotics were administered within an appropriate time frame prior to the initiation of the procedure. ANESTHESIA/SEDATION: Fentanyl 75 mcg IV; Versed 2.0 mg IV Moderate Sedation Time:  43 minutes The patient was continuously monitored during the procedure by the interventional radiology nurse under my direct supervision. COMPLICATIONS: None immediate. PROCEDURE: Informed written consent was obtained from the patient after a thorough discussion of the procedural risks, benefits and alternatives. All questions were addressed. A timeout was performed prior to the initiation of the procedure. Patient was placed supine on the CT scanner. Images through the abdomen obtained. The left side of the abdomen was marked. The skin was prepped with chlorhexidine and sterile field was created. Skin and soft tissues anesthetized with 1% lidocaine. A 18 gauge needle was directed into the abscess collection with CT guidance. Small amount of air and purulent fluid was obtained. A wire was advanced in the collection. Tract was dilated and advanced a 10.2 [place] drain over the wire. Unfortunately, the catheter formed superficial to the abdominal wall musculature. Needle was again advanced into the abscess but the wire went through the collection. Therefore, a Yueh catheter was used to puncture the abscess. The stiff Amplatz wire was coiled in the small collection and the tract was dilated to 10 [place]. A 10.2 [place] multipurpose drain was successfully advanced into the collection and approximately 5 mL of purulent fluid was aspirated. CT images confirmed decompression of the abscess cavity. Catheter was sutured to skin and attached to  a suction bulb. FINDINGS: Small air-fluid collection along the anterior lateral abdominal cavity and there may be some involvement in the abdominal wall. It was difficult to advance a catheter and wire into this collection due to its small size and location. Eventually, catheter was placed and the abscess collection was decompressed. 5 mL of bloody purulent fluid was removed. Fluid was sent for culture. IMPRESSION: Successful placement of a CT-guided drain within the left abdominal abscess collection. Purulent fluid was sent for culture. Electronically Signed   By: Markus Daft M.D.   On: 08/12/2015 17:37    Scheduled Meds: . allopurinol  300 mg Oral Daily  . amLODipine  5 mg Oral Daily  . aspirin EC  81 mg Oral Daily  . calcium-vitamin D  1 tablet Oral Q breakfast  . dorzolamide-timolol  1 drop Both Eyes BID  . enoxaparin (LOVENOX) injection  40 mg Subcutaneous Q24H  .  Ferrous Fumarate  1 tablet Oral Daily  . insulin aspart  0-9 Units Subcutaneous TID WC  . insulin detemir  25 Units Subcutaneous Daily  . metoprolol succinate  25 mg Oral Daily  . piperacillin-tazobactam (ZOSYN)  IV  3.375 g Intravenous Q8H  . sertraline  100 mg Oral q morning - 10a  . simvastatin  40 mg Oral QHS   Continuous Infusions: . sodium chloride 100 mL/hr at 08/13/15 1434    Assessment/Plan:  1. Clinical sepsis with diverticular abscess. Patient is status post interventional radiology drainage with tube placement. Patient feeling better since the procedure. Continue IV Zosyn. Currently gram-negative rods growing in the culture of the drainage procedure. Surgical follow-up. As outpatient will end up needing left part of the colon resected. Tolerated liquid diet. Continue IV fluids. Leukocytosis trending better. 2. Acute kidney injury. Continue to monitor. 3. Type 2 diabetes mellitus. Continue detemir and sliding scale 4. Hyperlipidemia unspecified on simvastatin 5. Essential hypertension on amlodipine and  metoprolol 6. Depression on Zoloft 7. History of gout on allopurinol  Code Status:     Code Status Orders        Start     Ordered   08/12/15 1745  Full code   Continuous     08/12/15 1744    Code Status History    Date Active Date Inactive Code Status Order ID Comments User Context   This patient has a current code status but no historical code status.     Family Communication: Family at bedside Disposition Plan: Will need IV antibiotics through the weekend  Consultants:  Gen. surgery  Procedures:  Interventional radiology drainage procedure of abscess  Antibiotics:  Zosyn  Time spent: 25 minutes  [place], [place]

## 2015-08-13 NOTE — Progress Notes (Signed)
CC: Diverticulitis with abscess Subjective: Patient reports feeling better today than on admission. Underwent IR drainage of the diverticular abscess without difficulty. Has been tolerating clear liquid diet and having bowel function.  Objective: Vital signs in last 24 hours: Temp:  [97.9 F (36.6 C)-98.7 F (37.1 C)] 98.3 F (36.8 C) (05/27 0411) Pulse Rate:  [86-114] 99 (05/27 0959) Resp:  [16-26] 20 (05/27 0411) BP: (105-143)/(54-78) 118/56 mmHg (05/27 0959) SpO2:  [92 %-100 %] 99 % (05/27 0411) Weight:  [73.347 kg (161 lb 11.2 oz)] 73.347 kg (161 lb 11.2 oz) (05/26 1819) Last BM Date: 08/13/15  Intake/Output from previous day: 05/26 0701 - 05/27 0700 In: 1495 [P.O.:360; I.V.:1135] Out: 1586 [Urine:1550; Emesis/NG output:1; Drains:35] Intake/Output this shift: Total I/O In: 165 [I.V.:147; IV Piggyback:18] Out: 0   Physical exam:  Gen.: No acute distress Chest: Clear to auscultation Heart: Regular rhythm Abdomen: Soft, nondistended, minimally tender to deep palpation left lower quadrant and at the drain site. Drain is currently having a sling and was output  Lab Results: CBC   Recent Labs  08/12/15 1013 08/13/15 0615  WBC 21.0* 14.3*  HGB 12.7 9.9*  HCT 38.5 30.2*  PLT 460* 332   BMET  Recent Labs  08/12/15 0849 08/12/15 1013  NA 136 134*  K 4.1 4.5  CL 96 100*  CO2 21 22  GLUCOSE 153* 161*  BUN 14 18  CREATININE 1.10* 1.21*  CALCIUM 9.4 9.5   PT/INR No results for input(s): LABPROT, INR in the last 72 hours. ABG No results for input(s): PHART, HCO3 in the last 72 hours.  Invalid input(s): PCO2, PO2  Studies/Results: Ct Abdomen Pelvis W Contrast  08/12/2015  ADDENDUM REPORT: 08/12/2015 15:42 ADDENDUM: Number 5 under Impression should read as follows: 7 mm cystic lesion in the tail of the pancreas. This could represent a small cystic pancreatic neoplasm. This could be further evaluated with a pre and postcontrast magnetic resonance imaging  examination of the pancreas. Electronically Signed   By: Claudie Revering M.D.   On: 08/12/2015 15:42  08/12/2015  CLINICAL DATA:  Left lower abdominal pain and vomiting for the past 2 weeks. EXAM: CT ABDOMEN AND PELVIS WITH CONTRAST TECHNIQUE: Multidetector CT imaging of the abdomen and pelvis was performed using the standard protocol following bolus administration of intravenous contrast. CONTRAST:  17mL ISOVUE-300 IOPAMIDOL (ISOVUE-300) INJECTION 61% COMPARISON:  03/14/2011. FINDINGS: Lower chest: Minimal right lower lobe dependent atelectasis. Minimal cylindrical bronchiectasis in the lingula. Hepatobiliary: Small area of focal fat deposition in the medial segment of the left lobe of the liver adjacent to the falciform ligament. Tiny probable cyst in the posterior segment of the right lobe of the liver. These are not seen on the previous noncontrast examination. Cholecystectomy clips. Pancreas: 7 mm rounded cystic area in the tail of the pancreas, confirmed on the coronal and sagittal reconstruction images. This is difficult to assess for comparison on the previous noncontrast examination. Otherwise, normal appearing pancreas. Spleen: Within normal limits in size and appearance. Adrenals/Urinary Tract: The previously described small angiomyolipoma in the lower pole of the right kidney has not changed significantly, measuring 9 x 6 mm on image number 44 of series 2. The previous noncontrast examination clearly demonstrated macroscopic fat within this mass. Interval visualization of a 5 mm minimally exophytic low density mass or a complicated cyst in the medial aspect of the upper pole of the left kidney. Normal appearing adrenal glands, ureters and urinary bladder. Stomach/Bowel: Diffuse medium to low density wall thickening  involving the proximal and mid portions of the sigmoid colon. Multiple diverticula in that region. There is also a left lower quadrant anterior abdominal wall and peritoneal fluid and gas  collection, measuring 4.5 x 2.1 cm on image number 49 of series 2, with surrounding soft tissue edema. This is adjacent to the proximal sigmoid colon with wall thickening and diverticula. There is a low to medium density communication between these 2 areas, best seen on the sagittal images, including image number 126. The gas and fluid collection measures 4.5 cm in length on sagittal image number 130. Small sliding hiatal hernia. Unremarkable small bowel. Surgically absent appendix. Vascular/Lymphatic: Mild atheromatous arterial calcifications. No enlarged lymph nodes. Reproductive: Surgically absent uterus.  Normal appearing ovaries. Other: None. Musculoskeletal: Mild lumbar and lower thoracic spine degenerative changes. IMPRESSION: 1. Proximal and mid sigmoid colon diverticulosis and diverticulitis with a proximal sigmoid colon para colonic diverticular abscess containing fluid and gas, 4.5 x 4.5 x 2.1 cm. This is involving the left anterior abdominal wall. 2. Small sliding hiatal hernia. 3. 5 mm medium to low density mass or mildly complicated cyst in the upper pole of the left kidney. This could be further evaluated with pre and postcontrast magnetic resonance imaging of the kidneys. 4. Stable lower pole right kidney angiomyolipoma. Electronically Signed: By: Claudie Revering M.D. On: 08/12/2015 12:50   Ct Image Guided Fluid Drain By Catheter  08/12/2015  INDICATION: 65 year old with a diverticular abscess in the left abdomen. EXAM: CT-GUIDED DRAINAGE OF LEFT ABDOMINAL ABSCESS MEDICATIONS: The patient is currently admitted to the hospital and receiving intravenous antibiotics. The antibiotics were administered within an appropriate time frame prior to the initiation of the procedure. ANESTHESIA/SEDATION: Fentanyl 75 mcg IV; Versed 2.0 mg IV Moderate Sedation Time:  43 minutes The patient was continuously monitored during the procedure by the interventional radiology nurse under my direct supervision.  COMPLICATIONS: None immediate. PROCEDURE: Informed written consent was obtained from the patient after a thorough discussion of the procedural risks, benefits and alternatives. All questions were addressed. A timeout was performed prior to the initiation of the procedure. Patient was placed supine on the CT scanner. Images through the abdomen obtained. The left side of the abdomen was marked. The skin was prepped with chlorhexidine and sterile field was created. Skin and soft tissues anesthetized with 1% lidocaine. A 18 gauge needle was directed into the abscess collection with CT guidance. Small amount of air and purulent fluid was obtained. A wire was advanced in the collection. Tract was dilated and advanced a 10.2 [place] drain over the wire. Unfortunately, the catheter formed superficial to the abdominal wall musculature. Needle was again advanced into the abscess but the wire went through the collection. Therefore, a Yueh catheter was used to puncture the abscess. The stiff Amplatz wire was coiled in the small collection and the tract was dilated to 10 [place]. A 10.2 [place] multipurpose drain was successfully advanced into the collection and approximately 5 mL of purulent fluid was aspirated. CT images confirmed decompression of the abscess cavity. Catheter was sutured to skin and attached to a suction bulb. FINDINGS: Small air-fluid collection along the anterior lateral abdominal cavity and there may be some involvement in the abdominal wall. It was difficult to advance a catheter and wire into this collection due to its small size and location. Eventually, catheter was placed and the abscess collection was decompressed. 5 mL of bloody purulent fluid was removed. Fluid was sent for culture. IMPRESSION: Successful placement of a CT-guided  drain within the left abdominal abscess collection. Purulent fluid was sent for culture. Electronically Signed   By: Markus Daft M.D.   On: 08/12/2015 17:37     Anti-infectives: Anti-infectives    Start     Dose/Rate Route Frequency Ordered Stop   08/12/15 2200  piperacillin-tazobactam (ZOSYN) IVPB 3.375 g     3.375 g 12.5 mL/hr over 240 Minutes Intravenous Every 8 hours 08/12/15 1744     08/12/15 1315  piperacillin-tazobactam (ZOSYN) IVPB 3.375 g     3.375 g 100 mL/hr over 30 Minutes Intravenous  Once 08/12/15 1301 08/12/15 1411   08/12/15 1315  piperacillin-tazobactam (ZOSYN) IVPB 3.375 g  Status:  Discontinued     3.375 g 100 mL/hr over 30 Minutes Intravenous  Once 08/12/15 1302 08/12/15 1303      Assessment/Plan:  65 year old female admitted to the medicine service yesterday with diverticulitis with abscess. Tolerated her INR drainage without difficulty. His had an appropriate improvement in her white blood cell count with drainage and antibiotics. Would continue IV antibiotics until white count is normal and then transitioned to oral antibiotics for two-week course. Surgery will continue to follow with you for drain management. She will likely need to go home with the drain and follow-up with the surgery clinic for drain removal later this week. Encourage ambulation and incentive spirometer usage.  Jazz Biddy T. Adonis Huguenin, MD, FACS  08/13/2015

## 2015-08-13 NOTE — Progress Notes (Signed)
Per Dr. Leslye Peer do not give 25 units of Levemir as pts sugar this morning is the highest it has been at 130. Pt has been as low as 75. Pt on clear liquids and is not taking in very much. Will continue to monitor and use sliding scale as needed.

## 2015-08-14 LAB — GLUCOSE, CAPILLARY
GLUCOSE-CAPILLARY: 141 mg/dL — AB (ref 65–99)
Glucose-Capillary: 200 mg/dL — ABNORMAL HIGH (ref 65–99)
Glucose-Capillary: 91 mg/dL (ref 65–99)
Glucose-Capillary: 93 mg/dL (ref 65–99)

## 2015-08-14 LAB — BASIC METABOLIC PANEL
Anion gap: 8 (ref 5–15)
CO2: 23 mmol/L (ref 22–32)
CREATININE: 0.79 mg/dL (ref 0.44–1.00)
Calcium: 7.8 mg/dL — ABNORMAL LOW (ref 8.9–10.3)
Chloride: 106 mmol/L (ref 101–111)
GFR calc Af Amer: >60 mL/min (ref >60)
GLUCOSE: 153 mg/dL — AB (ref 65–99)
Potassium: 2.8 mmol/L — CL (ref 3.5–5.1)
SODIUM: 137 mmol/L (ref 135–145)

## 2015-08-14 LAB — URINE CULTURE

## 2015-08-14 MED ORDER — SODIUM CHLORIDE 0.9% FLUSH
5.0000 mL | Freq: Every morning | INTRAVENOUS | Status: DC
  Administered 2015-08-15: 5 mL

## 2015-08-14 MED ORDER — MAGNESIUM SULFATE 2 GM/50ML IV SOLN
2.0000 g | Freq: Once | INTRAVENOUS | Status: AC
  Administered 2015-08-14: 2 g via INTRAVENOUS
  Filled 2015-08-14: qty 50

## 2015-08-14 MED ORDER — POTASSIUM CHLORIDE CRYS ER 20 MEQ PO TBCR
40.0000 meq | EXTENDED_RELEASE_TABLET | Freq: Two times a day (BID) | ORAL | Status: AC
  Administered 2015-08-14 (×2): 40 meq via ORAL
  Filled 2015-08-14 (×2): qty 2

## 2015-08-14 MED ORDER — POTASSIUM CHLORIDE IN NACL 20-0.9 MEQ/L-% IV SOLN
INTRAVENOUS | Status: DC
  Administered 2015-08-14 – 2015-08-15 (×2): via INTRAVENOUS
  Filled 2015-08-14 (×4): qty 1000

## 2015-08-14 MED ORDER — SODIUM CHLORIDE 0.9% FLUSH: 40.0000 mL | Freq: Every morning | INTRAVENOUS | Status: DC

## 2015-08-14 MED ORDER — DEXTROSE 5 % IV SOLN
2.0000 g | INTRAVENOUS | Status: DC
  Administered 2015-08-14: 2 g via INTRAVENOUS
  Filled 2015-08-14 (×2): qty 2

## 2015-08-14 NOTE — Progress Notes (Signed)
CC: Diverticulitis Subjective: Patient reports feeling better today than yesterday. She has been tolerating a diet with minimal abdominal pain.  Objective: Vital signs in last 24 hours: Temp:  [98.2 F (36.8 C)-99.3 F (37.4 C)] 98.2 F (36.8 C) (05/28 1313) Pulse Rate:  [84-91] 84 (05/28 1313) Resp:  [16-20] 16 (05/28 1313) BP: (118-130)/(56-61) 118/57 mmHg (05/28 1313) SpO2:  [90 %-94 %] 94 % (05/28 1313) Last BM Date: 08/14/15  Intake/Output from previous day: 05/27 0701 - 05/28 0700 In: 3652 [P.O.:1230; I.V.:2261; IV Piggyback:156] Out: 26 [Urine:3; Drains:40; Stool:3] Intake/Output this shift: Total I/O In: 720 [P.O.:720] Out: 5 [Urine:3; Stool:2]  Physical exam:  Gen.: No acute distress Chest: Clear to auscultation Heart: Regular rate and rhythm Abdomen: Soft, minimally tender to palpation left lower quadrant, nondistended. Drain in place draining a serosanguineous fluid.  Lab Results: CBC   Recent Labs  08/12/15 1013 08/13/15 0615  WBC 21.0* 14.3*  HGB 12.7 9.9*  HCT 38.5 30.2*  PLT 460* 332   BMET  Recent Labs  08/12/15 1013 08/14/15 0742  NA 134* 137  K 4.5 2.8*  CL 100* 106  CO2 22 23  GLUCOSE 161* 153*  BUN 18 <5*  CREATININE 1.21* 0.79  CALCIUM 9.5 7.8*   PT/INR No results for input(s): LABPROT, INR in the last 72 hours. ABG No results for input(s): PHART, HCO3 in the last 72 hours.  Invalid input(s): PCO2, PO2  Studies/Results: Ct Image Guided Fluid Drain By Catheter  08/12/2015  INDICATION: 65 year old with a diverticular abscess in the left abdomen. EXAM: CT-GUIDED DRAINAGE OF LEFT ABDOMINAL ABSCESS MEDICATIONS: The patient is currently admitted to the hospital and receiving intravenous antibiotics. The antibiotics were administered within an appropriate time frame prior to the initiation of the procedure. ANESTHESIA/SEDATION: Fentanyl 75 mcg IV; Versed 2.0 mg IV Moderate Sedation Time:  43 minutes The patient was continuously  monitored during the procedure by the interventional radiology nurse under my direct supervision. COMPLICATIONS: None immediate. PROCEDURE: Informed written consent was obtained from the patient after a thorough discussion of the procedural risks, benefits and alternatives. All questions were addressed. A timeout was performed prior to the initiation of the procedure. Patient was placed supine on the CT scanner. Images through the abdomen obtained. The left side of the abdomen was marked. The skin was prepped with chlorhexidine and sterile field was created. Skin and soft tissues anesthetized with 1% lidocaine. A 18 gauge needle was directed into the abscess collection with CT guidance. Small amount of air and purulent fluid was obtained. A wire was advanced in the collection. Tract was dilated and advanced a 10.2 [place] drain over the wire. Unfortunately, the catheter formed superficial to the abdominal wall musculature. Needle was again advanced into the abscess but the wire went through the collection. Therefore, a Yueh catheter was used to puncture the abscess. The stiff Amplatz wire was coiled in the small collection and the tract was dilated to 10 [place]. A 10.2 [place] multipurpose drain was successfully advanced into the collection and approximately 5 mL of purulent fluid was aspirated. CT images confirmed decompression of the abscess cavity. Catheter was sutured to skin and attached to a suction bulb. FINDINGS: Small air-fluid collection along the anterior lateral abdominal cavity and there may be some involvement in the abdominal wall. It was difficult to advance a catheter and wire into this collection due to its small size and location. Eventually, catheter was placed and the abscess collection was decompressed. 5 mL of bloody purulent  fluid was removed. Fluid was sent for culture. IMPRESSION: Successful placement of a CT-guided drain within the left abdominal abscess collection. Purulent fluid was sent  for culture. Electronically Signed   By: Markus Daft M.D.   On: 08/12/2015 17:37    Anti-infectives: Anti-infectives    Start     Dose/Rate Route Frequency Ordered Stop   08/12/15 2200  piperacillin-tazobactam (ZOSYN) IVPB 3.375 g     3.375 g 12.5 mL/hr over 240 Minutes Intravenous Every 8 hours 08/12/15 1744     08/12/15 1315  piperacillin-tazobactam (ZOSYN) IVPB 3.375 g     3.375 g 100 mL/hr over 30 Minutes Intravenous  Once 08/12/15 1301 08/12/15 1411   08/12/15 1315  piperacillin-tazobactam (ZOSYN) IVPB 3.375 g  Status:  Discontinued     3.375 g 100 mL/hr over 30 Minutes Intravenous  Once 08/12/15 1302 08/12/15 1303      Assessment/Plan:  65 year old female with diverticulitis and associated abscess that is been drained by interventional radiology. Doing well. Anticipate being able to transition to oral antibiotics for which she will need to complete at least a 2 week course. Surgery will continue to follow along for drain care. Continue the drain to bulb suction until it is removed. Drain will not be removed until output is 0.  Anup Brigham T. Adonis Huguenin, MD, FACS  08/14/2015

## 2015-08-14 NOTE — Progress Notes (Signed)
Notified by lab that the pt has a critical potassium read of 2.8.  MD Dr. Earleen Newport notified.    Will continue to monitor.

## 2015-08-14 NOTE — Plan of Care (Signed)
Problem: Safety: Goal: Ability to remain free from injury will improve Outcome: Progressing Pt remains free from falls, call bell within reach.  Problem: Pain Managment: Goal: General experience of comfort will improve Outcome: Progressing Pt with c/o abd pain 4/10, pt denies need for prn pain meds.   Problem: Activity: Goal: Risk for activity intolerance will decrease Outcome: Progressing Pt up with assist x 1.  Problem: Nutrition: Goal: Adequate nutrition will be maintained Outcome: Progressing Pt remains on CLD, pt denies nausea.

## 2015-08-14 NOTE — Progress Notes (Signed)
Patient ID: Kara Leach, female   DOB: [birth year], 65 y.o.   MRN: [id number] Patient ID: Kara Leach, female   DOB: Jul 31, 1950, 65 y.o.   MRN: [id number] [place] PROGRESS NOTE  Kara Leach [id number] DOB: 1950-07-01 DOA: 08/12/2015 PCP: Kathrine Haddock, NP  HPI/Subjective: Patient feeling better than yesterday. Still having some pain in the left lower quadrant. Some nausea but no vomiting today.  Objective: Filed Vitals:   08/14/15 0848 08/14/15 1313  BP: 122/61 118/57  Pulse: 88 84  Temp:  98.2 F (36.8 C)  Resp:  16    Filed Weights   08/12/15 1006 08/12/15 1819  Weight: 68.947 kg (152 lb) 73.347 kg (161 lb 11.2 oz)    ROS: Review of Systems  Constitutional: Negative for fever and chills.  Eyes: Negative for blurred vision.  Respiratory: Negative for cough and shortness of breath.   Cardiovascular: Negative for chest pain.  Gastrointestinal: Positive for nausea and abdominal pain. Negative for vomiting, diarrhea and constipation.  Genitourinary: Negative for dysuria.  Musculoskeletal: Negative for joint pain.  Neurological: Negative for dizziness and headaches.   Exam: Physical Exam  Constitutional: She is oriented to person, place, and time.  HENT:  Nose: No mucosal edema.  Mouth/Throat: No oropharyngeal exudate or posterior oropharyngeal edema.  Eyes: Conjunctivae, EOM and lids are normal. Pupils are equal, round, and reactive to light.  Neck: No JVD present. Carotid bruit is not present. No edema present. No thyroid mass and no thyromegaly present.  Cardiovascular: S1 normal and S2 normal.  Exam reveals no gallop.   No murmur heard. Pulses:      Dorsalis pedis pulses are 2+ on the right side, and 2+ on the left side.  Respiratory: No respiratory distress. She has no wheezes. She has no rhonchi. She has no rales.  GI: Soft. Bowel sounds are normal. There is tenderness in the left lower quadrant.  Musculoskeletal:       Right ankle: She  exhibits no swelling.       Left ankle: She exhibits no swelling.  Lymphadenopathy:    She has no cervical adenopathy.  Neurological: She is alert and oriented to person, place, and time. No cranial nerve deficit.  Skin: Skin is warm. No rash noted. Nails show no clubbing.  Psychiatric: She has a normal mood and affect.      Data Reviewed: Basic Metabolic Panel:  Recent Labs Lab 08/12/15 0849 08/12/15 1013 08/14/15 0742  NA 136 134* 137  K 4.1 4.5 2.8*  CL 96 100* 106  CO2 21 22 23   GLUCOSE 153* 161* 153*  BUN 14 18 <5*  CREATININE 1.10* 1.21* 0.79  CALCIUM 9.4 9.5 7.8*   Liver Function Tests:  Recent Labs Lab 08/12/15 0849 08/12/15 1013  AST 19 35  ALT 10 15  ALKPHOS 161* 132*  BILITOT 0.4 0.8  PROT 7.2 8.4*  ALBUMIN 3.9 4.0    Recent Labs Lab 08/12/15 1013  LIPASE 48   CBC:  Recent Labs Lab 08/12/15 0848 08/12/15 1013 08/13/15 0615  WBC 19.0* 21.0* 14.3*  NEUTROABS 14.5*  --   --   HGB  --  12.7 9.9*  HCT 36.4 38.5 30.2*  MCV 90 89.7 91.1  PLT 436* 460* 332   Cardiac Enzymes:  Recent Labs Lab 08/12/15 1013  TROPONINI 0.05*   CBG:  Recent Labs Lab 08/13/15 1126 08/13/15 1637 08/13/15 2121 08/14/15 0723 08/14/15 1120  GLUCAP 186* 171* 133* 141* 200*    Recent  Results (from the past 240 hour(s))  Blood Culture (routine x 2)     Status: None (Preliminary result)   Collection Time: 08/12/15  1:00 PM  Result Value Ref Range Status   Specimen Description BLOOD RIGHT ARM  Final   Special Requests BOTTLES DRAWN AEROBIC AND ANAEROBIC [place]  Final   Culture NO GROWTH 2 DAYS  Final   Report Status PENDING  Incomplete  Blood Culture (routine x 2)     Status: None (Preliminary result)   Collection Time: 08/12/15  1:20 PM  Result Value Ref Range Status   Specimen Description BLOOD RIGHT ASSIST CONTROL  Final   Special Requests BOTTLES DRAWN AEROBIC AND ANAEROBIC 3CCAERO,3CCANA  Final   Culture NO GROWTH 2 DAYS  Final   Report  Status PENDING  Incomplete  Urine culture     Status: Abnormal   Collection Time: 08/12/15  2:23 PM  Result Value Ref Range Status   Specimen Description URINE, RANDOM  Final   Special Requests NONE  Final   Culture >=100,000 COLONIES/mL ESCHERICHIA COLI (A)  Final   Report Status 08/14/2015 FINAL  Final   Organism ID, Bacteria ESCHERICHIA COLI (A)  Final      Susceptibility   Escherichia coli - MIC*    AMPICILLIN 4 SENSITIVE Sensitive     CEFAZOLIN <=4 SENSITIVE Sensitive     CEFTRIAXONE <=1 SENSITIVE Sensitive     CIPROFLOXACIN <=0.25 SENSITIVE Sensitive     GENTAMICIN <=1 SENSITIVE Sensitive     IMIPENEM <=0.25 SENSITIVE Sensitive     NITROFURANTOIN <=16 SENSITIVE Sensitive     TRIMETH/SULFA <=20 SENSITIVE Sensitive     AMPICILLIN/SULBACTAM <=2 SENSITIVE Sensitive     PIP/TAZO <=4 SENSITIVE Sensitive     Extended ESBL NEGATIVE Sensitive     * >=100,000 COLONIES/mL ESCHERICHIA COLI  CULTURE, ROUTINE-ABSCESS ([facility] ONLY)     Status: None (Preliminary result)   Collection Time: 08/12/15  5:10 PM  Result Value Ref Range Status   Specimen Description ABSCESS  Final   Special Requests Normal  Final   Gram Stain PENDING  Incomplete   Culture MODERATE GROWTH ESCHERICHIA COLI  Final   Report Status PENDING  Incomplete   Organism ID, Bacteria ESCHERICHIA COLI  Final      Susceptibility   Escherichia coli - MIC*    AMPICILLIN 4 SENSITIVE Sensitive     CEFAZOLIN <=4 SENSITIVE Sensitive     CEFEPIME <=1 SENSITIVE Sensitive     CEFTAZIDIME <=1 SENSITIVE Sensitive     CEFTRIAXONE <=1 SENSITIVE Sensitive     CIPROFLOXACIN <=0.25 SENSITIVE Sensitive     GENTAMICIN <=1 SENSITIVE Sensitive     IMIPENEM <=0.25 SENSITIVE Sensitive     TRIMETH/SULFA <=20 SENSITIVE Sensitive     AMPICILLIN/SULBACTAM <=2 SENSITIVE Sensitive     PIP/TAZO <=4 SENSITIVE Sensitive     Extended ESBL NEGATIVE Sensitive     * MODERATE GROWTH ESCHERICHIA COLI     Studies: Ct Image Guided Fluid Drain By  Catheter  08/12/2015  INDICATION: 65 year old with a diverticular abscess in the left abdomen. EXAM: CT-GUIDED DRAINAGE OF LEFT ABDOMINAL ABSCESS MEDICATIONS: The patient is currently admitted to the hospital and receiving intravenous antibiotics. The antibiotics were administered within an appropriate time frame prior to the initiation of the procedure. ANESTHESIA/SEDATION: Fentanyl 75 mcg IV; Versed 2.0 mg IV Moderate Sedation Time:  43 minutes The patient was continuously monitored during the procedure by the interventional radiology nurse under my direct supervision. COMPLICATIONS: None immediate. PROCEDURE: Informed  written consent was obtained from the patient after a thorough discussion of the procedural risks, benefits and alternatives. All questions were addressed. A timeout was performed prior to the initiation of the procedure. Patient was placed supine on the CT scanner. Images through the abdomen obtained. The left side of the abdomen was marked. The skin was prepped with chlorhexidine and sterile field was created. Skin and soft tissues anesthetized with 1% lidocaine. A 18 gauge needle was directed into the abscess collection with CT guidance. Small amount of air and purulent fluid was obtained. A wire was advanced in the collection. Tract was dilated and advanced a 10.2 [place] drain over the wire. Unfortunately, the catheter formed superficial to the abdominal wall musculature. Needle was again advanced into the abscess but the wire went through the collection. Therefore, a Yueh catheter was used to puncture the abscess. The stiff Amplatz wire was coiled in the small collection and the tract was dilated to 10 [place]. A 10.2 [place] multipurpose drain was successfully advanced into the collection and approximately 5 mL of purulent fluid was aspirated. CT images confirmed decompression of the abscess cavity. Catheter was sutured to skin and attached to a suction bulb. FINDINGS: Small air-fluid  collection along the anterior lateral abdominal cavity and there may be some involvement in the abdominal wall. It was difficult to advance a catheter and wire into this collection due to its small size and location. Eventually, catheter was placed and the abscess collection was decompressed. 5 mL of bloody purulent fluid was removed. Fluid was sent for culture. IMPRESSION: Successful placement of a CT-guided drain within the left abdominal abscess collection. Purulent fluid was sent for culture. Electronically Signed   By: Markus Daft M.D.   On: 08/12/2015 17:37    Scheduled Meds: . allopurinol  300 mg Oral Daily  . amLODipine  5 mg Oral Daily  . aspirin EC  81 mg Oral Daily  . calcium-vitamin D  1 tablet Oral Q breakfast  . dorzolamide-timolol  1 drop Both Eyes BID  . enoxaparin (LOVENOX) injection  40 mg Subcutaneous Q24H  . Ferrous Fumarate  1 tablet Oral Daily  . insulin aspart  0-9 Units Subcutaneous TID WC  . insulin detemir  25 Units Subcutaneous Daily  . metoprolol succinate  25 mg Oral Daily  . piperacillin-tazobactam (ZOSYN)  IV  3.375 g Intravenous Q8H  . potassium chloride  40 mEq Oral BID  . sertraline  100 mg Oral q morning - 10a  . simvastatin  40 mg Oral QHS   Continuous Infusions: . 0.9 % NaCl with KCl 20 mEq / L 75 mL/hr at 08/14/15 1234    Assessment/Plan:  1. Clinical sepsis with diverticular abscess. Patient is status post interventional radiology drainage with tube placement. Patient feeling better since the procedure. Continue IV Zosyn. Escherichia coli growing in urine and abscess culture. Surgical follow-up. As outpatient will end up needing left part of the colon resected. Tolerated liquid diet. Continue IV fluids. Leukocytosis trending better. Advance diet. Change antibiotics over to Rocephin. 2. Hypokalemia replace potassium orally and IV. 3. Acute kidney injury. Improved 4. Type 2 diabetes mellitus. Continue detemir and sliding scale 5. Hyperlipidemia  unspecified on simvastatin 6. Essential hypertension on amlodipine and metoprolol 7. Depression on Zoloft 8. History of gout on allopurinol  Code Status:     Code Status Orders        Start     Ordered   08/12/15 1745  Full code   Continuous  08/12/15 1744    Code Status History    Date Active Date Inactive Code Status Order ID Comments User Context   This patient has a current code status but no historical code status.     Family Communication: Family at bedside Disposition Plan: Will need IV antibiotics through the weekend  Consultants:  Gen. surgery  Procedures:  Interventional radiology drainage procedure of abscess  Antibiotics:  Rocephin  Time spent: 25 minutes  [place], [place]

## 2015-08-15 ENCOUNTER — Other Ambulatory Visit: Payer: Self-pay | Admitting: Unknown Physician Specialty

## 2015-08-15 DIAGNOSIS — K5720 Diverticulitis of large intestine with perforation and abscess without bleeding: Secondary | ICD-10-CM

## 2015-08-15 LAB — CULTURE, ROUTINE-ABSCESS: Special Requests: NORMAL

## 2015-08-15 LAB — BASIC METABOLIC PANEL
Anion gap: 5 (ref 5–15)
CHLORIDE: 108 mmol/L (ref 101–111)
CO2: 25 mmol/L (ref 22–32)
CREATININE: 0.78 mg/dL (ref 0.44–1.00)
Calcium: 8.1 mg/dL — ABNORMAL LOW (ref 8.9–10.3)
GFR calc Af Amer: >60 mL/min (ref >60)
GFR calc non Af Amer: >60 mL/min (ref >60)
GLUCOSE: 85 mg/dL (ref 65–99)
POTASSIUM: 3.7 mmol/L (ref 3.5–5.1)
SODIUM: 138 mmol/L (ref 135–145)

## 2015-08-15 LAB — GLUCOSE, CAPILLARY
GLUCOSE-CAPILLARY: 234 mg/dL — AB (ref 65–99)
Glucose-Capillary: 81 mg/dL (ref 65–99)

## 2015-08-15 LAB — CBC
HCT: 29.1 % — ABNORMAL LOW (ref 35.0–47.0)
HEMOGLOBIN: 9.5 g/dL — AB (ref 12.0–16.0)
MCH: 29.6 pg (ref 26.0–34.0)
MCHC: 32.6 g/dL (ref 32.0–36.0)
MCV: 90.9 fL (ref 80.0–100.0)
Platelets: 319 10*3/uL (ref 150–440)
RBC: 3.2 MIL/uL — ABNORMAL LOW (ref 3.80–5.20)
RDW: 15.2 % — ABNORMAL HIGH (ref 11.5–14.5)
WBC: 9.2 10*3/uL (ref 3.6–11.0)

## 2015-08-15 LAB — MAGNESIUM: MAGNESIUM: 1.7 mg/dL (ref 1.7–2.4)

## 2015-08-15 MED ORDER — INSULIN DETEMIR 100 UNIT/ML FLEXPEN: 15.0000 [IU] | PEN_INJECTOR | Freq: Every morning | SUBCUTANEOUS | Status: DC

## 2015-08-15 MED ORDER — FERROUS FUMARATE 324 (106 FE) MG PO TABS: 1.0000 | ORAL_TABLET | Freq: Every day | ORAL | Status: DC

## 2015-08-15 MED ORDER — DEXTROSE 5 % IV SOLN
2.0000 g | Freq: Once | INTRAVENOUS | Status: AC
  Administered 2015-08-15: 2 g via INTRAVENOUS
  Filled 2015-08-15: qty 2

## 2015-08-15 MED ORDER — AMOXICILLIN-POT CLAVULANATE 875-125 MG PO TABS: 1.0000 | ORAL_TABLET | Freq: Two times a day (BID) | ORAL | Status: DC

## 2015-08-15 MED ORDER — INSULIN ASPART 100 UNIT/ML FLEXPEN: 6.0000 [IU] | PEN_INJECTOR | Freq: Three times a day (TID) | SUBCUTANEOUS | Status: DC

## 2015-08-15 MED ORDER — OXYCODONE HCL 5 MG PO TABS: 5.0000 mg | ORAL_TABLET | Freq: Four times a day (QID) | ORAL | Status: DC | PRN

## 2015-08-15 NOTE — Discharge Instructions (Signed)
Empty drain twice a day

## 2015-08-15 NOTE — Discharge Summary (Signed)
Kara Leach NAME: Kara Leach    MR#:  [id number]  DATE OF BIRTH:  1950/09/10  DATE OF ADMISSION:  08/12/2015 ADMITTING PHYSICIAN: Fritzi Mandes, MD  DATE OF DISCHARGE: 08/15/2015  2:46 PM  PRIMARY CARE PHYSICIAN: Kathrine Haddock, NP    ADMISSION DIAGNOSIS:  Colonic diverticular abscess [K57.20]  DISCHARGE DIAGNOSIS:  Active Problems:   Colonic diverticular abscess   SECONDARY DIAGNOSIS:   Past Medical History  Diagnosis Date  . Diabetes mellitus without complication ([place])   . Hyperlipidemia   . Hypertension   . Depression   . Chronic kidney disease   . Arthritis     Osteoarthritis  . GERD (gastroesophageal reflux disease)   . Allergy   . Obesity   . Last menstrual period (LMP) > 10 days ago 1986  . Renal insufficiency     HOSPITAL COURSE:   1. Clinical sepsis with diverticular abscess. Patient had interventional radiology procedure with drainage tube placement. Cultures growing Escherichia coli. Patient feeling a lot better since the procedure. Patient was on IV Zosyn initially and switched over to IV Rocephin and then upon discharge on by mouth Augmentin for a total of 2 weeks. Patient was seen in consultation by surgery. Outpatient follow-up next week will be needed. Patient likely will end up needing a colon resection in a few months. Leukocytosis trended better. Patient tolerating advanced diet without nausea. Small prescription for pain medication prescribed. Patient will go home with a drainage tube with follow-up with surgery as outpatient. Nursing staff to teach patient how to drain that drainage tube. 2. Hypokalemia this was replaced yesterday IV and orally. Now that she is tolerating a diet this should not be an issue. 3. Acute kidney injury which has improved. 4. Type 2 diabetes mellitus. Sugar on the lower side this morning decrease detemir insulin to 15 units daily. 5 units prior to meals. 5. Hyperlipidemia  unspecified on by mouth simvastatin 6. Essential hypertension on amlodipine and metoprolol 7. Depression on Zoloft 8. History of gout on allopurinol 9. Anemia on iron 10. Elevated troponin which is demand ischemia secondary to sepsis  DISCHARGE CONDITIONS:   Satisfactory  CONSULTS OBTAINED:  Treatment Team:  Clayburn Pert, MD  DRUG ALLERGIES:   Allergies  Allergen Reactions  . Ibuprofen Other (See Comments)    "ringing in ears"  . Strawberry Extract Swelling    Lips swell and rash over body    DISCHARGE MEDICATIONS:   Discharge Medication List as of 08/15/2015 12:01 PM    START taking these medications   Details  amoxicillin-clavulanate (AUGMENTIN) 875-125 MG tablet Take 1 tablet by mouth every 12 (twelve) hours., Starting 08/16/2015, Until Discontinued, Print    Ferrous Fumarate (HEMOCYTE - 106 MG FE) 324 (106 Fe) MG TABS tablet Take 1 tablet (106 mg of iron total) by mouth daily., Starting 08/15/2015, Until Discontinued, Print    oxyCODONE (OXY IR/ROXICODONE) 5 MG immediate release tablet Take 1 tablet (5 mg total) by mouth every 6 (six) hours as needed for moderate pain., Starting 08/15/2015, Until Discontinued, Print      CONTINUE these medications which have CHANGED   Details  insulin aspart (NOVOLOG) 100 UNIT/ML FlexPen Inject 6 Units into the skin 3 (three) times daily with meals., Starting 08/15/2015, Until Discontinued, No Print    Insulin Detemir (LEVEMIR FLEXTOUCH) 100 UNIT/ML Pen Inject 15 Units into the skin every morning., Starting 08/16/2015, Until Discontinued, No Print      CONTINUE these  medications which have NOT CHANGED   Details  allopurinol (ZYLOPRIM) 300 MG tablet TAKE ONE TABLET BY MOUTH EVERY DAY, Normal    amLODipine (NORVASC) 5 MG tablet TAKE 1 TABLET BY MOUTH EVERY DAY, Normal    aspirin EC 81 MG tablet Take 81 mg by mouth daily., Until Discontinued, Historical Med    Calcium Carb-Cholecalciferol (CALCIUM 600 + D PO) Take 1 tablet by mouth  2 (two) times daily., Until Discontinued, Historical Med    dorzolamide-timolol (COSOPT) 22.3-6.8 MG/ML ophthalmic solution Place 1 drop into both eyes 2 (two) times daily., Until Discontinued, Historical Med    enalapril (VASOTEC) 2.5 MG tablet TAKE ONE TABLET BY MOUTH EVERY DAY, Normal    fluticasone (FLONASE) 50 MCG/ACT nasal spray Place 2 sprays into both nostrils daily as needed for allergies. , Until Discontinued, Historical Med    metoprolol succinate (TOPROL-XL) 25 MG 24 hr tablet TAKE ONE TABLET BY MOUTH EVERY DAY, Normal    ranitidine (ZANTAC) 150 MG tablet Take 150 mg by mouth 2 (two) times daily., Until Discontinued, Historical Med    sertraline (ZOLOFT) 100 MG tablet TAKE ONE TABLET BY MOUTH EVERY MORNING, Normal    simvastatin (ZOCOR) 40 MG tablet TAKE 1 TABLET BY MOUTH EVERY NIGHT AT BEDTIME, Normal    Specialty Vitamins Products (VITA-RX DIABETIC VITAMIN) CAPS Take 1 packet by mouth daily. Diabetic Vitamins - 6 or 7 in 1 pack, Until Discontinued, Historical Med      STOP taking these medications     loperamide (IMODIUM) 2 MG capsule      VICTOZA 18 MG/3ML SOPN          DISCHARGE INSTRUCTIONS:   Follow-up surgery 1 week Follow-up PMD 2 weeks  If you experience worsening of your admission symptoms, develop shortness of breath, life threatening emergency, suicidal or homicidal thoughts you must seek medical attention immediately by calling 911 or calling your MD immediately  if symptoms less severe.  You Must read complete instructions/literature along with all the possible adverse reactions/side effects for all the Medicines you take and that have been prescribed to you. Take any new Medicines after you have completely understood and accept all the possible adverse reactions/side effects.   Please note  You were cared for by a hospitalist during your hospital stay. If you have any questions about your discharge medications or the care you received while you were  in the hospital after you are discharged, you can call the unit and asked to speak with the hospitalist on call if the hospitalist that took care of you is not available. Once you are discharged, your primary care physician will handle any further medical issues. Please note that NO REFILLS for any discharge medications will be authorized once you are discharged, as it is imperative that you return to your primary care physician (or establish a relationship with a primary care physician if you do not have one) for your aftercare needs so that they can reassess your need for medications and monitor your lab values.    Today   CHIEF COMPLAINT:   Chief Complaint  Patient presents with  . Abdominal Pain  . Emesis    HISTORY OF PRESENT ILLNESS:  Kara Leach  is a 65 y.o. female presented with abdominal pain and vomiting   VITAL SIGNS:  Blood pressure 116/65, pulse 73, temperature 97.8 F (36.6 C), temperature source Oral, resp. rate 16, height 5' (1.524 m), weight 73.347 kg (161 lb 11.2 oz), SpO2 98 %.  PHYSICAL EXAMINATION:  GENERAL:  65 y.o.-year-old patient lying in the bed with no acute distress.  EYES: Pupils equal, round, reactive to light and accommodation. No scleral icterus. Extraocular muscles intact.  HEENT: Head atraumatic, normocephalic. Oropharynx and nasopharynx clear.  NECK:  Supple, no jugular venous distention. No thyroid enlargement, no tenderness.  LUNGS: Normal breath sounds bilaterally, no wheezing, rales,rhonchi or crepitation. No use of accessory muscles of respiration.  CARDIOVASCULAR: S1, S2 normal. No murmurs, rubs, or gallops.  ABDOMEN: Soft, left lower quadrant tenderness, non-distended. Bowel sounds present. No organomegaly or mass.  EXTREMITIES: No pedal edema, cyanosis, or clubbing.  NEUROLOGIC: Cranial nerves II through XII are intact. Muscle strength 5/5 in all extremities. Sensation intact. Gait not checked.  PSYCHIATRIC: The patient is alert and  oriented x 3.  SKIN: No obvious rash, lesion, or ulcer.   DATA REVIEW:   CBC  Recent Labs Lab 08/15/15 0558  WBC 9.2  HGB 9.5*  HCT 29.1*  PLT 319    Chemistries   Recent Labs Lab 08/12/15 1013  08/15/15 0558  NA 134*  < > 138  K 4.5  < > 3.7  CL 100*  < > 108  CO2 22  < > 25  GLUCOSE 161*  < > 85  BUN 18  < > <5*  CREATININE 1.21*  < > 0.78  CALCIUM 9.5  < > 8.1*  MG  --   --  1.7  AST 35  --   --   ALT 15  --   --   ALKPHOS 132*  --   --   BILITOT 0.8  --   --   < > = values in this interval not displayed.  Cardiac Enzymes  Recent Labs Lab 08/12/15 1013  TROPONINI 0.05*    Microbiology Results  Results for orders placed or performed during the hospital encounter of 08/12/15  Blood Culture (routine x 2)     Status: None (Preliminary result)   Collection Time: 08/12/15  1:00 PM  Result Value Ref Range Status   Specimen Description BLOOD RIGHT ARM  Final   Special Requests BOTTLES DRAWN AEROBIC AND ANAEROBIC [place]  Final   Culture NO GROWTH 3 DAYS  Final   Report Status PENDING  Incomplete  Blood Culture (routine x 2)     Status: None (Preliminary result)   Collection Time: 08/12/15  1:20 PM  Result Value Ref Range Status   Specimen Description BLOOD RIGHT ASSIST CONTROL  Final   Special Requests BOTTLES DRAWN AEROBIC AND ANAEROBIC 3CCAERO,3CCANA  Final   Culture NO GROWTH 3 DAYS  Final   Report Status PENDING  Incomplete  Urine culture     Status: Abnormal   Collection Time: 08/12/15  2:23 PM  Result Value Ref Range Status   Specimen Description URINE, RANDOM  Final   Special Requests NONE  Final   Culture >=100,000 COLONIES/mL ESCHERICHIA COLI (A)  Final   Report Status 08/14/2015 FINAL  Final   Organism ID, Bacteria ESCHERICHIA COLI (A)  Final      Susceptibility   Escherichia coli - MIC*    AMPICILLIN 4 SENSITIVE Sensitive     CEFAZOLIN <=4 SENSITIVE Sensitive     CEFTRIAXONE <=1 SENSITIVE Sensitive     CIPROFLOXACIN <=0.25  SENSITIVE Sensitive     GENTAMICIN <=1 SENSITIVE Sensitive     IMIPENEM <=0.25 SENSITIVE Sensitive     NITROFURANTOIN <=16 SENSITIVE Sensitive     TRIMETH/SULFA <=20 SENSITIVE Sensitive  AMPICILLIN/SULBACTAM <=2 SENSITIVE Sensitive     PIP/TAZO <=4 SENSITIVE Sensitive     Extended ESBL NEGATIVE Sensitive     * >=100,000 COLONIES/mL ESCHERICHIA COLI  CULTURE, ROUTINE-ABSCESS ([facility] ONLY)     Status: None   Collection Time: 08/12/15  5:10 PM  Result Value Ref Range Status   Specimen Description ABSCESS  Final   Special Requests Normal  Final   Gram Stain   Final    TOO NUMEROUS TO COUNT WBC SEEN MANY GRAM NEGATIVE RODS RARE GRAM POSITIVE COCCI    Culture MODERATE GROWTH ESCHERICHIA COLI  Final   Report Status 08/15/2015 FINAL  Final   Organism ID, Bacteria ESCHERICHIA COLI  Final      Susceptibility   Escherichia coli - MIC*    AMPICILLIN 4 SENSITIVE Sensitive     CEFAZOLIN <=4 SENSITIVE Sensitive     CEFEPIME <=1 SENSITIVE Sensitive     CEFTAZIDIME <=1 SENSITIVE Sensitive     CEFTRIAXONE <=1 SENSITIVE Sensitive     CIPROFLOXACIN <=0.25 SENSITIVE Sensitive     GENTAMICIN <=1 SENSITIVE Sensitive     IMIPENEM <=0.25 SENSITIVE Sensitive     TRIMETH/SULFA <=20 SENSITIVE Sensitive     AMPICILLIN/SULBACTAM <=2 SENSITIVE Sensitive     PIP/TAZO <=4 SENSITIVE Sensitive     Extended ESBL NEGATIVE Sensitive     * MODERATE GROWTH ESCHERICHIA COLI    Management plans discussed with the patient, family and they are in agreement.  CODE STATUS:     Code Status Orders        Start     Ordered   08/12/15 1745  Full code   Continuous     08/12/15 1744    Code Status History    Date Active Date Inactive Code Status Order ID Comments User Context   This patient has a current code status but no historical code status.      TOTAL TIME TAKING CARE OF THIS PATIENT: 35 minutes.    Loletha Grayer M.D on 08/15/2015 at 4:25 PM  Between 7am to 6pm - Pager - [phone]  After 6pm  go to [email or web address] - password [email or web address] [facility]  [phone]  CC: Primary care physician; Kathrine Haddock, NP

## 2015-08-15 NOTE — Care Management (Signed)
Physical therapy is recommending home health physical therapy.  Patient declines.  She has family members available and does not feel needs therapy.  She will discharge with a jp drain.  She does not feel that a home health nurse is needed to assist with the drain.  Inform patient's nurse  of need to teach /obtain return demonstration of management and care of jp drain.  Updated attending.  Patient for discharge home today

## 2015-08-15 NOTE — Care Management Important Message (Signed)
Important Message  Patient Details  Name: Kara Leach MRN: [id number] Date of Birth: [birth year]   Medicare Important Message Given:  Yes    [name] 08/15/2015, 11:46 AM

## 2015-08-15 NOTE — Evaluation (Signed)
Physical Therapy Evaluation Patient Details Name: Kara Leach MRN: [id number] DOB: 1950/04/23 Today's Date: 08/15/2015   History of Present Illness  Kara Leach is a 65 y.o. female with a known history of Hypertension, diabetes type 2 insulin requiring, osteoarthritis, hyperlipidemia comes to the emergency room with complaints of intractable nausea and vomiting and left lower quadrant abdominal pain. She was diagnosed with clinical sepsis with diverticular abscess. Patient is s/p INR drainage on 08/13/15;   Clinical Impression  65 yo Female admitted with diverticular abscess s/p INR drainage. Patient was independent and living at home with her sons prior to admittance. She was driving and walking independently. Currently she is mod I for bed mobility and transfers. She ambulated 175 feet without AD, supervision with wide base of support and slower gait speed. Patient is slightly unsteady requiring supervision for safety. She did have one episode of slight loss of balance. She was able to self correct with cues for trunk control. Patient would benefit from additional skilled PT intervention to improve balance/gait safety.     Follow Up Recommendations Home health PT;Supervision - Intermittent    Equipment Recommendations  None recommended by PT    Recommendations for Other Services Rehab consult     Precautions / Restrictions Precautions Precautions: Fall Restrictions Weight Bearing Restrictions: No      Mobility  Bed Mobility Overal bed mobility: Modified Independent             General bed mobility comments: supine<>sitting, elevated head of bed, used bed rails;   Transfers Overall transfer level: Modified independent Equipment used: None             General transfer comment: patient able to stand well with balance but did take a longer time to stand up; slower movement due to weakness;   Ambulation/Gait Ambulation/Gait assistance: Supervision Ambulation  Distance (Feet): 175 Feet Assistive device: None Gait Pattern/deviations: Step-through pattern;Decreased stride length;Decreased dorsiflexion - right;Decreased dorsiflexion - left;Wide base of support Gait velocity: decreased   General Gait Details: demonstrates increased toe out (which could be baseline); ambulates at slower gait speed; did have one episode of staggering left but was able to recover balance; patient's SPo2 remained >98% while on room air with gait.   Stairs            Wheelchair Mobility    Modified Rankin (Stroke Patients Only)       Balance Overall balance assessment: Needs assistance   Sitting balance-Leahy Scale: Normal     Standing balance support: No upper extremity supported Standing balance-Leahy Scale: Good Standing balance comment: static standing balance is good; dynamic standing balance is good-, patient does require supervision for safety and is slower with gait tasks;                              Pertinent Vitals/Pain Pain Assessment: 0-10 Pain Score: 3  Pain Location: abdominal pain (left side) Pain Descriptors / Indicators: Sore Pain Intervention(s): Limited activity within patient's tolerance;Repositioned    Home Living Family/patient expects to be discharged to:: Private residence Living Arrangements: Children (son's girlfriend is there during day; sons work during day; ) Available Help at Discharge: Family Type of Home: House Home Access: Stairs to enter Entrance Stairs-Rails: None Entrance Stairs-Number of Steps: 2 Home Layout: One level (has one step to get to laundry room, no rails; ) Home Equipment: None      Prior Function Level of Independence: Independent  Comments: patient was driving, not using any assistive devices prior to admittance; was active in community;      Hand Dominance        Extremity/Trunk Assessment   Upper Extremity Assessment: Overall WFL for tasks assessed            Lower Extremity Assessment: RLE deficits/detail;LLE deficits/detail RLE Deficits / Details: grossly 4/5, reports episodes of numbness/tingling in feet; intact light touch today;  LLE Deficits / Details: grossly 4/5, reports episodes of numbness/tingling in feet; intact light touch today;   Cervical / Trunk Assessment: Normal  Communication   Communication: No difficulties  Cognition Arousal/Alertness: Awake/alert Behavior During Therapy: WFL for tasks assessed/performed Overall Cognitive Status: Within Functional Limits for tasks assessed                      General Comments General comments (skin integrity, edema, etc.): grossly intact;     Exercises        Assessment/Plan    PT Assessment Patient needs continued PT services  PT Diagnosis Difficulty walking;Generalized weakness   PT Problem List Decreased strength;Decreased mobility;Decreased safety awareness  PT Treatment Interventions Gait training;Stair training;Functional mobility training;Therapeutic activities;Therapeutic exercise;Patient/family education;Balance training   PT Goals (Current goals can be found in the Care Plan section) Acute Rehab PT Goals Patient Stated Goal: "I  would like to go home." PT Goal Formulation: With patient Time For Goal Achievement: 08/29/15 Potential to Achieve Goals: Good    Frequency Min 2X/week   Barriers to discharge Inaccessible home environment has 2 steps to get onto portch with no railing;     Co-evaluation               End of Session Equipment Utilized During Treatment: Gait belt Activity Tolerance: Patient tolerated treatment well Patient left: in chair;with call bell/phone within reach;with chair alarm set Nurse Communication: Mobility status         Time: [id number] PT Time Calculation (min) (ACUTE ONLY): 17 min   Charges:   PT Evaluation $PT Eval Low Complexity: 1 Procedure     PT G Codes:        Kara Leach PT, DPT 08/15/2015,  9:34 AM

## 2015-08-15 NOTE — Progress Notes (Signed)
CC: Diverticular abscess Subjective: Much improved Eating regular diet AVSS  Objective: Vital signs in last 24 hours: Temp:  [97.8 F (36.6 C)-98.2 F (36.8 C)] 97.8 F (36.6 C) (05/29 [id number]) Pulse Rate:  [73-84] 73 (05/29 0608) Resp:  [16] 16 (05/29 0608) BP: (116-132)/(57-65) 116/65 mmHg (05/29 0608) SpO2:  [94 %-99 %] 98 % (05/29 0842) Last BM Date: 08/14/15  Intake/Output from previous day: 05/28 0701 - 05/29 0700 In: 2641 [P.O.:1200; I.V.:1332; IV Piggyback:104] Out: 1039 [Urine:1004; Drains:33; Stool:2] Intake/Output this shift:    Physical exam: NAD, alert Abd: soft, NT, no peritonitis, JP cloudy draiange Ext: no edema  Lab Results: CBC   Recent Labs  08/13/15 0615 08/15/15 0558  WBC 14.3* 9.2  HGB 9.9* 9.5*  HCT 30.2* 29.1*  PLT 332 319   BMET  Recent Labs  08/14/15 0742 08/15/15 0558  NA 137 138  K 2.8* 3.7  CL 106 108  CO2 23 25  GLUCOSE 153* 85  BUN <5* <5*  CREATININE 0.79 0.78  CALCIUM 7.8* 8.1*   PT/INR No results for input(s): LABPROT, INR in the last 72 hours. ABG No results for input(s): PHART, HCO3 in the last 72 hours.  Invalid input(s): PCO2, PO2  Studies/Results: No results found.  Anti-infectives: Anti-infectives    Start     Dose/Rate Route Frequency Ordered Stop   08/14/15 1430  cefTRIAXone (ROCEPHIN) 2 g in dextrose 5 % 50 mL IVPB     2 g 100 mL/hr over 30 Minutes Intravenous Every 24 hours 08/14/15 1420     08/12/15 2200  piperacillin-tazobactam (ZOSYN) IVPB 3.375 g  Status:  Discontinued     3.375 g 12.5 mL/hr over 240 Minutes Intravenous Every 8 hours 08/12/15 1744 08/14/15 1420   08/12/15 1315  piperacillin-tazobactam (ZOSYN) IVPB 3.375 g     3.375 g 100 mL/hr over 30 Minutes Intravenous  Once 08/12/15 1301 08/12/15 1411   08/12/15 1315  piperacillin-tazobactam (ZOSYN) IVPB 3.375 g  Status:  Discontinued     3.375 g 100 mL/hr over 30 Minutes Intravenous  Once 08/12/15 1302 08/12/15 1303       Assessment/Plan: Diverticular abscess w significant improvement, may transition to PO antibiotics and DC home w drain No immediate surgical intervention She will eventually need sigmoid colectomy in the outpt setting and after this episode has resolved   Caroleen Hamman, MD, FACS  08/15/2015

## 2015-08-15 NOTE — Progress Notes (Addendum)
Alert and oriented. Vital signs stable . No signs of acute distress. Discharge instructions given. JP drain care was also given to patient. Per Dr. Dahlia Byes, JP drain does not need to be flushed. Patient verbalizes understanding. No other issues noted at this time.

## 2015-08-17 ENCOUNTER — Other Ambulatory Visit: Payer: Self-pay | Admitting: Unknown Physician Specialty

## 2015-08-17 LAB — CULTURE, BLOOD (ROUTINE X 2)
CULTURE: NO GROWTH
Culture: NO GROWTH

## 2015-08-23 LAB — CBC WITH DIFFERENTIAL/PLATELET

## 2015-08-25 LAB — COMPREHENSIVE METABOLIC PANEL
ALBUMIN: 3.9 g/dL (ref 3.6–4.8)
ALK PHOS: 161 IU/L — AB (ref 39–117)
ALT: 10 IU/L (ref 0–32)
AST: 19 IU/L (ref 0–40)
Albumin/Globulin Ratio: 1.2 (ref 1.2–2.2)
BILIRUBIN TOTAL: 0.4 mg/dL (ref 0.0–1.2)
BUN / CREAT RATIO: 13 (ref 12–28)
BUN: 14 mg/dL (ref 8–27)
CHLORIDE: 96 mmol/L (ref 96–106)
CO2: 21 mmol/L (ref 18–29)
Calcium: 9.4 mg/dL (ref 8.7–10.3)
Creatinine, Ser: 1.1 mg/dL — ABNORMAL HIGH (ref 0.57–1.00)
GFR calc non Af Amer: 54 mL/min/{1.73_m2} — ABNORMAL LOW (ref >59)
GFR, EST AFRICAN AMERICAN: 62 mL/min/{1.73_m2} (ref >59)
GLOBULIN, TOTAL: 3.3 g/dL (ref 1.5–4.5)
Glucose: 153 mg/dL — ABNORMAL HIGH (ref 65–99)
Potassium: 4.1 mmol/L (ref 3.5–5.2)
SODIUM: 136 mmol/L (ref 134–144)
TOTAL PROTEIN: 7.2 g/dL (ref 6.0–8.5)

## 2015-08-25 LAB — HEPATITIS C ANTIBODY: Hep C Virus Ab: <0.1 s/co ratio (ref 0.0–0.9)

## 2015-08-25 LAB — LIPID PANEL W/O CHOL/HDL RATIO
Cholesterol, Total: 111 mg/dL (ref 100–199)
HDL: 30 mg/dL — AB (ref >39)
LDL Calculated: 39 mg/dL (ref 0–99)
TRIGLYCERIDES: 210 mg/dL — AB (ref 0–149)
VLDL Cholesterol Cal: 42 mg/dL — ABNORMAL HIGH (ref 5–40)

## 2015-08-25 LAB — HIV ANTIBODY (ROUTINE TESTING W REFLEX): HIV SCREEN 4TH GENERATION: NONREACTIVE

## 2015-08-26 ENCOUNTER — Other Ambulatory Visit: Payer: Self-pay

## 2015-08-26 ENCOUNTER — Ambulatory Visit (INDEPENDENT_AMBULATORY_CARE_PROVIDER_SITE_OTHER): Payer: Medicare HMO | Admitting: Surgery

## 2015-08-26 ENCOUNTER — Encounter: Payer: Self-pay | Admitting: Surgery

## 2015-08-26 VITALS — BP 125/70 | HR 103 | Temp 98.3°F | Ht 60.0 in | Wt 154.0 lb

## 2015-08-26 DIAGNOSIS — K5732 Diverticulitis of large intestine without perforation or abscess without bleeding: Secondary | ICD-10-CM

## 2015-08-26 MED ORDER — METRONIDAZOLE 500 MG PO TABS: 500.0000 mg | ORAL_TABLET | Freq: Three times a day (TID) | ORAL | Status: DC

## 2015-08-26 MED ORDER — CEPHALEXIN 500 MG PO CAPS: 500.0000 mg | ORAL_CAPSULE | Freq: Four times a day (QID) | ORAL | Status: DC

## 2015-08-26 NOTE — Progress Notes (Signed)
Outpatient Surgical Follow Up  08/26/2015  Kara Leach is an 65 y.o. female.   CC: Diverticulitis with abscess  HPI: This a patient with diverticulitis with abscess he had an abscess drained percutaneously she was seen by Dr. Adonis Huguenin in the hospital and asked to return today to the clinic. Patient took her last dose of Augmentin today. She feels better but still has some foul-smelling drainage from her percutaneous drain albeit minimal in volume she denies fevers or chills is tolerating a diet and having bowel movements which she says are normal  Past Medical History  Diagnosis Date  . Diabetes mellitus without complication ([place])   . Hyperlipidemia   . Hypertension   . Depression   . Chronic kidney disease   . Arthritis     Osteoarthritis  . GERD (gastroesophageal reflux disease)   . Allergy   . Obesity   . Last menstrual period (LMP) > 10 days ago 1986  . Renal insufficiency     Past Surgical History  Procedure Laterality Date  . Appendectomy    . Abdominal hysterectomy    . Wrist fracture surgery Left     Steel plate  . Patella fracture surgery Left   . Cholecystectomy      Family History  Problem Relation Age of Onset  . Diabetes Mother   . Heart disease Mother   . Hypertension Mother   . Diabetes Sister   . Cancer Father     stomach  . Arthritis Sister   . Stroke Sister   . Hypertension Sister     Social History:  reports that she has never smoked. She has never used smokeless tobacco. She reports that she drinks alcohol. She reports that she does not use illicit drugs.  Allergies:  Allergies  Allergen Reactions  . Ibuprofen Other (See Comments)    "ringing in ears"  . Strawberry Extract Swelling    Lips swell and rash over body    Medications reviewed.   Review of Systems:   Review of Systems  Constitutional: Negative for fever and chills.  HENT: Negative.   Eyes: Negative.   Respiratory: Negative.   Cardiovascular: Negative.    Gastrointestinal: Negative for heartburn, nausea, vomiting, abdominal pain and diarrhea.       Foul-smelling drainage from percutaneous drain  Genitourinary: Negative.   Musculoskeletal: Negative.   Skin: Negative.   Neurological: Negative.   Endo/Heme/Allergies: Negative.   Psychiatric/Behavioral: Negative.      Physical Exam:  BP 125/70 mmHg  Pulse 103  Temp(Src) 98.3 F (36.8 C) (Oral)  Ht 5' (1.524 m)  Wt 154 lb (69.854 kg)  BMI 30.08 kg/m2  LMP  (LMP Unknown)  Physical Exam  Constitutional: She is oriented to person, place, and time and well-developed, well-nourished, and in no distress. No distress.  HENT:  Head: Normocephalic and atraumatic.  Eyes: Right eye exhibits no discharge. Left eye exhibits no discharge. No scleral icterus.  Neck: Normal range of motion.  Cardiovascular: Normal rate and regular rhythm.   Pulmonary/Chest: Effort normal and breath sounds normal. No respiratory distress. She has no wheezes. She has no rales.  Abdominal: Soft. She exhibits no distension. There is no tenderness. There is no rebound and no guarding.  Left lower quadrant drain with Belinda Block purulent drainage  Musculoskeletal: Normal range of motion. She exhibits no edema.  Lymphadenopathy:    She has no cervical adenopathy.  Neurological: She is alert and oriented to person, place, and time.  Skin: Skin  is warm and dry. No rash noted. She is not diaphoretic. No erythema.  Psychiatric: Mood and affect normal.  Vitals reviewed.     No results found for this or any previous visit (from the past 48 hour(s)). No results found.  Assessment/Plan:  Ongoing drainage in small quantities but still obviously purulent and foul smelling. She has finished her Augmentin and I will switch to Keflex and Flagyl at this point and obtain a CT scan on Monday to have her follow-up in the office for possible drain removal. I'm hesitant to remove the drain at this point with this quality of drainage  as she would be at a high risk for recurrent abscess. He'll see Dr. Adonis Huguenin next week after the CAT scan  Florene Glen, MD, FACS

## 2015-08-26 NOTE — Patient Instructions (Addendum)
We have scheduled you for a CT Scan of your Abdomen and Pelvis. This has been scheduled on 08/29/15 at [facility] with 1415 arrival. You will need to pick up a prep kit at least 24 hours in advance of your Scan: You may pick this up at the [facility] at [facility], [address] Location, or [employer]. Bring a list of medications with you to your appointment. If you have an allergy to IVP dye or CT contrast and this has not been addressed, please call our office [phone] and ask to speak with a nurse at least 48 hours in advance of your CT Scan. Nothing to eat or drink 4 hours prior to your CT Scan.  If you need to reschedule your Scan, you may do so by calling [phone].  We have sent in 2 different antibiotics. You will begin these as soon as you pick them up today and continue until they are completed.  We will plan on seeing you back in the office and possibly pulling your drain at that time. Please see your appointment below.

## 2015-08-29 ENCOUNTER — Ambulatory Visit
Admission: RE | Admit: 2015-08-29 | Discharge: 2015-08-29 | Disposition: A | Discharge status: Home / Self Care | Payer: Medicare HMO | Source: Ambulatory Visit | Attending: Surgery | Admitting: Surgery

## 2015-08-29 ENCOUNTER — Ambulatory Visit: Admission: RE | Admit: 2015-08-29 | Payer: Medicare HMO | Source: Ambulatory Visit

## 2015-08-29 DIAGNOSIS — K5732 Diverticulitis of large intestine without perforation or abscess without bleeding: Secondary | ICD-10-CM | POA: Insufficient documentation

## 2015-08-29 DIAGNOSIS — K6389 Other specified diseases of intestine: Secondary | ICD-10-CM | POA: Diagnosis not present

## 2015-08-29 MED ORDER — IOPAMIDOL (ISOVUE-300) INJECTION 61%
85.0000 mL | Freq: Once | INTRAVENOUS | Status: AC | PRN
  Administered 2015-08-29: 85 mL via INTRAVENOUS

## 2015-08-30 ENCOUNTER — Encounter: Payer: Self-pay | Admitting: Unknown Physician Specialty

## 2015-08-30 ENCOUNTER — Ambulatory Visit (INDEPENDENT_AMBULATORY_CARE_PROVIDER_SITE_OTHER): Payer: Medicare HMO | Admitting: General Surgery

## 2015-08-30 ENCOUNTER — Ambulatory Visit (INDEPENDENT_AMBULATORY_CARE_PROVIDER_SITE_OTHER): Payer: Medicare HMO | Admitting: Unknown Physician Specialty

## 2015-08-30 ENCOUNTER — Encounter: Payer: Self-pay | Admitting: General Surgery

## 2015-08-30 VITALS — BP 116/74 | HR 94 | Temp 98.3°F | Ht 59.3 in | Wt 152.8 lb

## 2015-08-30 VITALS — BP 128/69 | HR 101 | Temp 98.0°F | Ht 60.0 in | Wt 153.0 lb

## 2015-08-30 DIAGNOSIS — K5720 Diverticulitis of large intestine with perforation and abscess without bleeding: Secondary | ICD-10-CM | POA: Diagnosis not present

## 2015-08-30 DIAGNOSIS — Z09 Encounter for follow-up examination after completed treatment for conditions other than malignant neoplasm: Secondary | ICD-10-CM | POA: Diagnosis not present

## 2015-08-30 NOTE — Patient Instructions (Signed)
Please call our office if you have any questions or concerns.  

## 2015-08-30 NOTE — Progress Notes (Signed)
            BP 116/74 mmHg  Pulse 94  Temp(Src) 98.3 F (36.8 C)  Ht 4' 11.3" (1.506 m)  Wt 152 lb 12.8 oz (69.31 kg)  BMI 30.56 kg/m2  SpO2 97%  LMP  (LMP Unknown)   Subjective:    Patient ID: Kara Leach, female    DOB: Oct 13, 1950, 65 y.o.   MRN: [id number]  HPI: Kara Leach is a 65 y.o. female  Chief Complaint  Patient presents with  . Hospitalization Follow-up   Pt is here for f/u of her hospitalization following diverticulitis with an abcess.  She was sent from this office on 5/25 and discharged on 5/29.  A drain was placed and recently removed.  She is under the care of a surgeon wo is planning on f/u colonoscopy once she heals.  She is on soft food diet that can advance as tolerated.    Last blood sugar was good control for her on 5/26 which was 7.6.  Pt is down on her insulin doses but will advance with her diet.    Anemia- Sees Dr. Grayland Ormond.  WBC was normal last check   elevant past medical, surgical, family and social history reviewed and updated as indicated. Interim medical history since our last visit reviewed. Allergies and medications reviewed and updated.  Review of Systems  Constitutional: Negative.   HENT: Negative.   Eyes: Negative.   Respiratory: Negative.   Cardiovascular: Negative.   Gastrointestinal: Negative.   Endocrine: Negative.   Genitourinary: Negative.   Musculoskeletal: Negative.   Skin: Negative.   Allergic/Immunologic: Negative.   Neurological: Negative.   Hematological: Negative.   Psychiatric/Behavioral: Negative.     Per HPI unless specifically indicated above     Objective:    BP 116/74 mmHg  Pulse 94  Temp(Src) 98.3 F (36.8 C)  Ht 4' 11.3" (1.506 m)  Wt 152 lb 12.8 oz (69.31 kg)  BMI 30.56 kg/m2  SpO2 97%  LMP  (LMP Unknown)  Wt Readings from Last 3 Encounters:  08/30/15 152 lb 12.8 oz (69.31 kg)  08/30/15 153 lb (69.4 kg)  08/26/15 154 lb (69.854 kg)    Physical Exam  Constitutional: She is  oriented to person, place, and time. She appears well-developed and well-nourished. No distress.  HENT:  Head: Normocephalic and atraumatic.  Eyes: Conjunctivae and lids are normal. Right eye exhibits no discharge. Left eye exhibits no discharge. No scleral icterus.  Neck: Normal range of motion. Neck supple. No JVD present. Carotid bruit is not present.  Cardiovascular: Normal rate, regular rhythm and normal heart sounds.   Pulmonary/Chest: Effort normal and breath sounds normal.  Abdominal: Normal appearance. There is no splenomegaly or hepatomegaly.  Musculoskeletal: Normal range of motion.  Neurological: She is alert and oriented to person, place, and time.  Skin: Skin is warm, dry and intact. No rash noted. No pallor.  Psychiatric: She has a normal mood and affect. Her behavior is normal. Judgment and thought content normal.    Assessment & Plan:   Problem List Items Addressed This Visit    None    Visit Diagnoses    Hospital discharge follow-up    -  Primary    Pt is stable and advancing diet and insulin as tolerated.  Recheck in 3 months        Follow up plan: Return for Appt in august.

## 2015-08-30 NOTE — Progress Notes (Signed)
Outpatient Surgical Follow Up  08/30/2015  Kara Leach is an 65 y.o. female.   Chief Complaint  Patient presents with  . Follow-up    Diverticulitis     HPI: 65 year old female returns to clinic for follow-up from recent hospitalization for sigmoid diverticulitis with abscess. She still has a percutaneous drain in place. Output has continued decrease with minimal over the last 24 hours. She had a repeat CT scan done yesterday which showed complete resolution of the abscess that was draining. Patient denies any fevers, chills, nausea, vomiting, diarrhea, possible patient. She has not completed her antibiotics yet.  Past Medical History  Diagnosis Date  . Diabetes mellitus without complication ([place])   . Hyperlipidemia   . Hypertension   . Depression   . Chronic kidney disease   . Arthritis     Osteoarthritis  . GERD (gastroesophageal reflux disease)   . Allergy   . Obesity   . Last menstrual period (LMP) > 10 days ago 1986  . Renal insufficiency     Stage 2 kidney disease    Past Surgical History  Procedure Laterality Date  . Appendectomy    . Abdominal hysterectomy    . Wrist fracture surgery Left     Steel plate  . Patella fracture surgery Left   . Cholecystectomy      Family History  Problem Relation Age of Onset  . Diabetes Mother   . Heart disease Mother   . Hypertension Mother   . Diabetes Sister   . Cancer Father     stomach  . Arthritis Sister   . Stroke Sister   . Hypertension Sister     Social History:  reports that she has never smoked. She has never used smokeless tobacco. She reports that she drinks alcohol. She reports that she does not use illicit drugs.  Allergies:  Allergies  Allergen Reactions  . Ibuprofen Other (See Comments)    "ringing in ears"  . Strawberry Extract Swelling    Lips swell and rash over body    Medications reviewed.    ROS A multipoint review of systems was completed. All pertinent positives and negatives are  documented within the history of present illness and remainder are negative.   BP 128/69 mmHg  Pulse 101  Temp(Src) 98 F (36.7 C) (Oral)  Ht 5' (1.524 m)  Wt 69.4 kg (153 lb)  BMI 29.88 kg/m2  LMP  (LMP Unknown)  Physical Exam Gen.: No acute distress Neck: Supple and nontender Chest: Clear to auscultation Heart: Regular rhythm Abdomen: Soft, nondistended, minimally tender to palpation around the drain site. Drain present in the left lower quadrant draining a minimal amount of serous fluid.    No results found for this or any previous visit (from the past 48 hour(s)). Ct Abdomen Pelvis W Contrast  08/29/2015  CLINICAL DATA:  65 year old female status post CT-guided left abdominal abscess drainage on [id number] following complicated diverticulitis. Treated with antibiotics. Tube is still draining. Subsequent encounter. EXAM: CT ABDOMEN AND PELVIS WITH CONTRAST TECHNIQUE: Multidetector CT imaging of the abdomen and pelvis was performed using the standard protocol following bolus administration of intravenous contrast. CONTRAST:  67mL ISOVUE-300 IOPAMIDOL (ISOVUE-300) INJECTION 61% COMPARISON:  CT Abdomen and Pelvis negative lung bases. No pericardial or pleural effusion. No acute osseous abnormality identified. Oral contrast has reached the rectum. No pelvic free fluid. Diminutive urinary bladder. Left lower quadrant percutaneous pigtail drainage catheter remains in place with satisfactory appearing drainage of the extra luminal gas  and fluid collection. Mild stranding extends from the pigtail loop toward the nearby junction of the descending and sigmoid colonic segments where diverticulosis is re- demonstrated. No extraluminal gas. Mild residual sigmoid wall thickening. No free fluid. The more proximal descending colon is negative. Negative transverse colon and right colon. The cecum is located in the right hemipelvis and otherwise negative. The uterus is surgically absent. Both ovaries are  within normal limits. No abnormal small bowel. Negative stomach and duodenum. No free fluid or free air in the upper abdomen. Surgically absent gallbladder. Mild decreased density again noted in the liver suggesting a degree of steatosis. Otherwise negative liver, spleen, pancreas and adrenal glands. Patent portal venous system. Mild Aortoiliac calcified atherosclerosis noted. Major arterial structures are patent. Stable kidneys. Symmetric and normal renal contrast excretion. No lymphadenopathy. FINDINGS: 1. Satisfactory post drainage appearance of the left lower quadrant by CT. No residual collection identified although there is mild stranding extending from the drain to the nearby sigmoid colon. Consider a small fistula if drainage continues. 2. Mild residual sigmoid wall thickening. No new abnormality in the abdomen or pelvis. Electronically Signed   By: Genevie Ann M.D.   On: 08/29/2015 14:41    Assessment/Plan:  1. Colonic diverticular abscess 65 year old female with percutaneous drain for diverticular abscess. Drain removed today in clinic without difficulty. Discussed with patient the importance of completing her antibiotic course. When she is done with her antibiotics also discussed the timeframe for obtaining a colonoscopy as she has not had one since she was 60. Discussed the signs and symptoms of recurrence of her abscess and to return to the hospital immediately should they occur. She voiced understanding and will call to set up her colonoscopy when she is ready.     Clayburn Pert, MD FACS General Surgeon  08/30/2015,2:17 PM

## 2015-09-12 ENCOUNTER — Other Ambulatory Visit: Payer: Self-pay | Admitting: Unknown Physician Specialty

## 2015-09-15 ENCOUNTER — Other Ambulatory Visit: Payer: Self-pay | Admitting: Unknown Physician Specialty

## 2015-09-27 ENCOUNTER — Other Ambulatory Visit: Payer: Self-pay | Admitting: Unknown Physician Specialty

## 2015-10-03 ENCOUNTER — Telehealth: Payer: Self-pay | Admitting: Unknown Physician Specialty

## 2015-10-03 MED ORDER — INSULIN PEN NEEDLE 32G X 4 MM MISC: 1.0000 [IU] | Freq: Every morning | Status: DC

## 2015-10-03 NOTE — Telephone Encounter (Signed)
Pt would like a refill on needles for her novalog sent to [facility] [address]

## 2015-10-03 NOTE — Telephone Encounter (Signed)
Routing to provider  

## 2015-10-12 ENCOUNTER — Other Ambulatory Visit: Payer: Self-pay | Admitting: Unknown Physician Specialty

## 2015-10-15 ENCOUNTER — Other Ambulatory Visit: Payer: Self-pay | Admitting: Unknown Physician Specialty

## 2015-10-17 NOTE — Telephone Encounter (Signed)
rx

## 2015-10-21 ENCOUNTER — Other Ambulatory Visit: Payer: Self-pay

## 2015-10-21 MED ORDER — PEG 3350-KCL-NABCB-NACL-NASULF 236 G PO SOLR: 4000.0000 mL | Freq: Once | ORAL | 0 refills | Status: AC

## 2015-10-26 ENCOUNTER — Other Ambulatory Visit: Payer: Self-pay | Admitting: Unknown Physician Specialty

## 2015-10-31 ENCOUNTER — Other Ambulatory Visit: Payer: Self-pay | Admitting: Family Medicine

## 2015-11-01 NOTE — Telephone Encounter (Signed)
rx

## 2015-11-02 ENCOUNTER — Ambulatory Visit: Payer: Medicare HMO | Admitting: Unknown Physician Specialty

## 2015-11-24 ENCOUNTER — Encounter: Payer: Self-pay | Admitting: *Deleted

## 2015-11-25 NOTE — Discharge Instructions (Signed)

## 2015-11-29 ENCOUNTER — Other Ambulatory Visit: Payer: Self-pay | Admitting: Family Medicine

## 2015-12-01 ENCOUNTER — Encounter
Admission: RE | Disposition: A | Discharge status: Home / Self Care | Payer: Self-pay | Source: Ambulatory Visit | Attending: Gastroenterology

## 2015-12-01 ENCOUNTER — Ambulatory Visit: Payer: Medicare HMO | Admitting: Anesthesiology

## 2015-12-01 ENCOUNTER — Ambulatory Visit
Admission: RE | Admit: 2015-12-01 | Discharge: 2015-12-01 | Disposition: A | Discharge status: Home / Self Care | Payer: Medicare HMO | Source: Ambulatory Visit | Attending: Gastroenterology | Admitting: Gastroenterology

## 2015-12-01 DIAGNOSIS — K5730 Diverticulosis of large intestine without perforation or abscess without bleeding: Secondary | ICD-10-CM | POA: Insufficient documentation

## 2015-12-01 DIAGNOSIS — F329 Major depressive disorder, single episode, unspecified: Secondary | ICD-10-CM | POA: Insufficient documentation

## 2015-12-01 DIAGNOSIS — Z79899 Other long term (current) drug therapy: Secondary | ICD-10-CM | POA: Diagnosis not present

## 2015-12-01 DIAGNOSIS — Z1211 Encounter for screening for malignant neoplasm of colon: Secondary | ICD-10-CM

## 2015-12-01 DIAGNOSIS — E785 Hyperlipidemia, unspecified: Secondary | ICD-10-CM | POA: Insufficient documentation

## 2015-12-01 DIAGNOSIS — K641 Second degree hemorrhoids: Secondary | ICD-10-CM | POA: Insufficient documentation

## 2015-12-01 DIAGNOSIS — Z7982 Long term (current) use of aspirin: Secondary | ICD-10-CM | POA: Diagnosis not present

## 2015-12-01 DIAGNOSIS — Z794 Long term (current) use of insulin: Secondary | ICD-10-CM | POA: Insufficient documentation

## 2015-12-01 DIAGNOSIS — Z6830 Body mass index (BMI) 30.0-30.9, adult: Secondary | ICD-10-CM | POA: Diagnosis not present

## 2015-12-01 DIAGNOSIS — I129 Hypertensive chronic kidney disease with stage 1 through stage 4 chronic kidney disease, or unspecified chronic kidney disease: Secondary | ICD-10-CM | POA: Insufficient documentation

## 2015-12-01 DIAGNOSIS — E669 Obesity, unspecified: Secondary | ICD-10-CM | POA: Diagnosis not present

## 2015-12-01 DIAGNOSIS — E1140 Type 2 diabetes mellitus with diabetic neuropathy, unspecified: Secondary | ICD-10-CM | POA: Insufficient documentation

## 2015-12-01 DIAGNOSIS — N182 Chronic kidney disease, stage 2 (mild): Secondary | ICD-10-CM | POA: Insufficient documentation

## 2015-12-01 DIAGNOSIS — R69 Illness, unspecified: Secondary | ICD-10-CM | POA: Diagnosis not present

## 2015-12-01 DIAGNOSIS — G4730 Sleep apnea, unspecified: Secondary | ICD-10-CM | POA: Insufficient documentation

## 2015-12-01 DIAGNOSIS — K219 Gastro-esophageal reflux disease without esophagitis: Secondary | ICD-10-CM | POA: Insufficient documentation

## 2015-12-01 DIAGNOSIS — E1122 Type 2 diabetes mellitus with diabetic chronic kidney disease: Secondary | ICD-10-CM | POA: Diagnosis not present

## 2015-12-01 HISTORY — PX: COLONOSCOPY WITH PROPOFOL: SHX5780

## 2015-12-01 HISTORY — DX: Presence of external hearing-aid: Z97.4

## 2015-12-01 HISTORY — DX: Headache: R51

## 2015-12-01 HISTORY — DX: Type 2 diabetes mellitus with diabetic neuropathy, unspecified: E11.40

## 2015-12-01 HISTORY — DX: Headache, unspecified: R51.9

## 2015-12-01 HISTORY — DX: Other symptoms and signs involving the musculoskeletal system: R29.898

## 2015-12-01 HISTORY — DX: Unspecified convulsions: R56.9

## 2015-12-01 HISTORY — DX: Sleep apnea, unspecified: G47.30

## 2015-12-01 LAB — GLUCOSE, CAPILLARY
GLUCOSE-CAPILLARY: 121 mg/dL — AB (ref 65–99)
Glucose-Capillary: 112 mg/dL — ABNORMAL HIGH (ref 65–99)

## 2015-12-01 SURGERY — COLONOSCOPY WITH PROPOFOL
Anesthesia: Monitor Anesthesia Care | Wound class: Contaminated

## 2015-12-01 MED ORDER — LACTATED RINGERS IV SOLN
INTRAVENOUS | Status: DC
  Administered 2015-12-01: 08:00:00 via INTRAVENOUS

## 2015-12-01 MED ORDER — PROPOFOL 10 MG/ML IV BOLUS
INTRAVENOUS | Status: DC | PRN
Start: 2015-12-01 — End: 2015-12-01
  Administered 2015-12-01 (×4): 20 mg via INTRAVENOUS
  Administered 2015-12-01: 10 mg via INTRAVENOUS
  Administered 2015-12-01 (×2): 20 mg via INTRAVENOUS
  Administered 2015-12-01: 50 mg via INTRAVENOUS

## 2015-12-01 MED ORDER — STERILE WATER FOR IRRIGATION IR SOLN
Status: DC | PRN
  Administered 2015-12-01: 09:00:00

## 2015-12-01 MED ORDER — LIDOCAINE HCL (CARDIAC) 20 MG/ML IV SOLN
INTRAVENOUS | Status: DC | PRN
  Administered 2015-12-01: 50 mg via INTRAVENOUS

## 2015-12-01 SURGICAL SUPPLY — 23 items

## 2015-12-01 NOTE — Op Note (Signed)
[facility] Patient Name: Kara Leach Procedure Date: 12/01/2015 8:48 AM MRN: [id number] Account #: [id number] Date of Birth: Oct 14, 1950 Admit Type: Outpatient Age: 65 Room: [facility] OR ROOM 01 Gender: Female Note Status: Finalized Procedure:            Colonoscopy Indications:          Screening for colorectal malignant neoplasm Providers:            [name] MD, MD Referring MD:         Kathrine Haddock (Referring MD) Medicines:            Propofol per Anesthesia Complications:        No immediate complications. Procedure:            Pre-Anesthesia Assessment:                       - Prior to the procedure, a History and Physical was                        performed, and patient medications and allergies were                        reviewed. The patient's tolerance of previous                        anesthesia was also reviewed. The risks and benefits of                        the procedure and the sedation options and risks were                        discussed with the patient. All questions were                        answered, and informed consent was obtained. Prior                        Anticoagulants: The patient has taken no previous                        anticoagulant or antiplatelet agents. ASA Grade                        Assessment: II - A patient with mild systemic disease.                        After reviewing the risks and benefits, the patient was                        deemed in satisfactory condition to undergo the                        procedure.                       After obtaining informed consent, the colonoscope was                        passed under direct vision. Throughout the procedure,  the patient's blood pressure, pulse, and oxygen                        saturations were monitored continuously. The Olympus                        CF-HQ190L Colonoscope (S#. [id number]) was introduced                through the anus and advanced to the the cecum,                        identified by appendiceal orifice and ileocecal valve.                        The colonoscopy was performed without difficulty. The                        patient tolerated the procedure well. The quality of                        the bowel preparation was excellent. Findings:      The perianal and digital rectal examinations were normal.      Multiple small-mouthed diverticula were found in the sigmoid colon.      Non-bleeding internal hemorrhoids were found during retroflexion. The       hemorrhoids were Grade II (internal hemorrhoids that prolapse but reduce       spontaneously). Impression:           - Diverticulosis in the sigmoid colon.                       - Non-bleeding internal hemorrhoids.                       - No specimens collected. Recommendation:       - Repeat colonoscopy in 10 years for screening unless                        any change in family history or lower GI problems. Procedure Code(s):    --- Professional ---                       [phone], Colonoscopy, flexible; diagnostic, including                        collection of specimen(s) by brushing or washing, when                        performed (separate procedure) Diagnosis Code(s):    --- Professional ---                       Z12.11, Encounter for screening for malignant neoplasm                        of colon CPT copyright 2016 [employer] Medical [employer]. All rights reserved. The codes documented in this report are preliminary and upon coder review may  be revised to meet current compliance requirements. [name] MD, MD 12/01/2015 9:13:09 AM This report has been signed electronically. Number of Addenda: 0 Note Initiated On: 12/01/2015 8:48 AM Scope Withdrawal Time: 0  hours 6 minutes 20 seconds  Total Procedure Duration: 0 hours 11 minutes 25 seconds       [facility]

## 2015-12-01 NOTE — Anesthesia Procedure Notes (Signed)
Procedure Name: MAC Performed by: Odaly Peri Pre-anesthesia Checklist: Patient identified, Emergency Drugs available, Suction available, Timeout performed and Patient being monitored Patient Re-evaluated:Patient Re-evaluated prior to inductionOxygen Delivery Method: Nasal cannula Placement Confirmation: positive ETCO2       

## 2015-12-01 NOTE — Anesthesia Preprocedure Evaluation (Addendum)
Anesthesia Evaluation  Patient identified by MRN, date of birth, ID band Patient awake    Reviewed: Allergy & Precautions, NPO status , Patient's Chart, lab work & pertinent test results  History of Anesthesia Complications (+) PSEUDOCHOLINESTERASE DEFICIENCY  Airway Mallampati: II  TM Distance: >3 FB Neck ROM: Full    Dental  (+)    Pulmonary sleep apnea ,    Pulmonary exam normal        Cardiovascular hypertension, Pt. on home beta blockers and Pt. on medications Normal cardiovascular exam     Neuro/Psych  Headaches, PSYCHIATRIC DISORDERS Depression    GI/Hepatic GERD  Medicated and Controlled,  Endo/Other  diabetes, Type 2, Insulin Dependent  Renal/GU CRFRenal disease     Musculoskeletal  (+) Arthritis , Osteoarthritis,    Abdominal   Peds  Hematology negative hematology ROS (+)   Anesthesia Other Findings   Reproductive/Obstetrics                            Anesthesia Physical Anesthesia Plan  ASA: III  Anesthesia Plan: MAC   Post-op Pain Management:    Induction: Intravenous  Airway Management Planned:   Additional Equipment:   Intra-op Plan:   Post-operative Plan:   Informed Consent: I have reviewed the patients History and Physical, chart, labs and discussed the procedure including the risks, benefits and alternatives for the proposed anesthesia with the patient or authorized representative who has indicated his/her understanding and acceptance.     Plan Discussed with: CRNA  Anesthesia Plan Comments:         Anesthesia Quick Evaluation

## 2015-12-01 NOTE — H&P (Signed)
Kara Lame, MD [facility] [address] [place], [place] [address] Phone: [phone] Fax : [phone]  Primary Care Physician:  Kathrine Haddock, NP Primary Gastroenterologist:  Dr. Allen Norris  Pre-Procedure History & Physical: HPI:  Kara Leach is a 65 y.o. female is here for a screening colonoscopy.   Past Medical History:  Diagnosis Date  . Allergy   . Arthritis    Osteoarthritis  . Chronic kidney disease   . Depression   . Diabetes mellitus without complication ([place])   . Diabetic neuropathy (HCC)    feet  . GERD (gastroesophageal reflux disease)   . Headache    sinus  . Hyperlipidemia   . Hypertension   . Last menstrual period (LMP) > 10 days ago 1986  . Obesity   . Renal insufficiency    Stage 2 kidney disease  . Seizures ([place])    due to high blood sugars. last time = 09/2014  . Sleep apnea    has CPAP, can't tolerate  . Wears hearing aid    right side  . Wrist weakness    left - after fracture and repair    Past Surgical History:  Procedure Laterality Date  . ABDOMINAL HYSTERECTOMY    . APPENDECTOMY    . CHOLECYSTECTOMY    . PATELLA FRACTURE SURGERY Left   . WRIST FRACTURE SURGERY Left    Steel plate    Prior to Admission medications   Medication Sig Start Date End Date Taking? Authorizing Provider  allopurinol (ZYLOPRIM) 300 MG tablet Take 1 tablet (300 mg total) by mouth daily. 08/15/15  Yes Kathrine Haddock, NP  amLODipine (NORVASC) 5 MG tablet TAKE 1 TABLET BY MOUTH EVERY DAY 10/17/15  Yes Kathrine Haddock, NP  aspirin EC 81 MG tablet Take 81 mg by mouth daily.   Yes Historical Provider, MD  Calcium Carb-Cholecalciferol (CALCIUM 600 + D PO) Take 1 tablet by mouth 2 (two) times daily.   Yes Historical Provider, MD  enalapril (VASOTEC) 2.5 MG tablet Take 1 tablet (2.5 mg total) by mouth daily. 08/15/15  Yes Kathrine Haddock, NP  fluticasone (FLONASE) 50 MCG/ACT nasal spray Place 2 sprays into both nostrils daily as needed for allergies.    Yes Historical  Provider, MD  insulin aspart (NOVOLOG) 100 UNIT/ML FlexPen Inject 6 Units into the skin 3 (three) times daily with meals. 08/15/15  Yes Richard Leslye Peer, MD  LEVEMIR FLEXTOUCH 100 UNIT/ML Pen INJECT 60 UNITS UNDER THE SKIN EVERY DAY AT 10PM 09/12/15  Yes Kathrine Haddock, NP  metoprolol succinate (TOPROL-XL) 25 MG 24 hr tablet Take 1 tablet (25 mg total) by mouth daily. 08/15/15  Yes Kathrine Haddock, NP  ranitidine (ZANTAC) 150 MG tablet Take 150 mg by mouth 2 (two) times daily.   Yes Historical Provider, MD  sertraline (ZOLOFT) 100 MG tablet TAKE ONE TABLET BY MOUTH EVERY MORNING 09/16/15  Yes Kathrine Haddock, NP  simvastatin (ZOCOR) 40 MG tablet TAKE 1 TABLET BY MOUTH EVERY NIGHT AT BEDTIME 10/17/15  Yes Kathrine Haddock, NP  Specialty Vitamins Products (VITA-RX DIABETIC VITAMIN) CAPS Take 1 packet by mouth daily. Diabetic Vitamins - 6 or 7 in 1 pack   Yes Historical Provider, MD  VICTOZA 18 MG/3ML SOPN Inject 1.2 mg into the skin daily. 07/27/15  Yes Historical Provider, MD  Insulin Pen Needle 32G X 4 MM MISC 1 Units by Does not apply route every morning. Pen needles 10/03/15   Kathrine Haddock, NP  LEVEMIR FLEXTOUCH 100 UNIT/ML Pen INJECT 60 UNITS UNDER  THE SKIN EVERY DAY AT 10PM 09/16/15   Kathrine Haddock, NP  LEVEMIR FLEXTOUCH 100 UNIT/ML Pen INJECT 60 UNITS UNDER THE SKIN EVERY DAY AT 10 PM 11/01/15   Kathrine Haddock, NP  VICTOZA 18 MG/3ML SOPN ADMINISTER 1.2 MG UNDER THE SKIN EVERY DAY 11/30/15   Volney American, PA-C    Allergies as of 10/21/2015 - Review Complete 08/30/2015  Allergen Reaction Noted  . Ibuprofen Other (See Comments) 07/06/2014  . Strawberry extract Swelling 08/20/2014    Family History  Problem Relation Age of Onset  . Diabetes Mother   . Heart disease Mother   . Hypertension Mother   . Diabetes Sister   . Cancer Father     stomach  . Arthritis Sister   . Stroke Sister   . Hypertension Sister     Social History   Social History  . Marital status: Widowed    Spouse name: N/A    . Number of children: N/A  . Years of education: N/A   Occupational History  . Not on file.   Social History Main Topics  . Smoking status: Never Smoker  . Smokeless tobacco: Never Used  . Alcohol use 0.0 oz/week     Comment: occasionally holiday, weddings  . Drug use: No  . Sexual activity: No   Other Topics Concern  . Not on file   Social History Narrative  . No narrative on file    Review of Systems: See HPI, otherwise negative ROS  Physical Exam: BP 112/70   Pulse 85   Temp 97.5 F (36.4 C) (Temporal)   Ht 4' 11.3" (1.506 m)   Wt 151 lb (68.5 kg)   LMP  (LMP Unknown)   SpO2 99%   BMI 30.19 kg/m  General:   Alert,  pleasant and cooperative in NAD Head:  Normocephalic and atraumatic. Neck:  Supple; no masses or thyromegaly. Lungs:  Clear throughout to auscultation.    Heart:  Regular rate and rhythm. Abdomen:  Soft, nontender and nondistended. Normal bowel sounds, without guarding, and without rebound.   Neurologic:  Alert and  oriented x4;  grossly normal neurologically.  Impression/Plan: Kara Leach is now here to undergo a screening colonoscopy.  Risks, benefits, and alternatives regarding colonoscopy have been reviewed with the patient.  Questions have been answered.  All parties agreeable.

## 2015-12-01 NOTE — Anesthesia Postprocedure Evaluation (Signed)
Anesthesia Post Note  Patient: Kara Leach  Procedure(s) Performed: Procedure(s) (LRB): COLONOSCOPY WITH PROPOFOL (N/A)  Patient location during evaluation: PACU Anesthesia Type: MAC Level of consciousness: awake and alert and oriented Pain management: pain level controlled Vital Signs Assessment: post-procedure vital signs reviewed and stable Respiratory status: spontaneous breathing and nonlabored ventilation Cardiovascular status: stable Postop Assessment: no signs of nausea or vomiting and adequate PO intake Anesthetic complications: no    Estill Batten

## 2015-12-01 NOTE — Transfer of Care (Signed)
Immediate Anesthesia Transfer of Care Note  Patient: Kara Leach  Procedure(s) Performed: Procedure(s) with comments: COLONOSCOPY WITH PROPOFOL (N/A) - Diabetic - insulin Sleep apnea  Patient Location: PACU  Anesthesia Type: MAC  Level of Consciousness: awake, alert  and patient cooperative  Airway and Oxygen Therapy: Patient Spontanous Breathing and Patient connected to supplemental oxygen  Post-op Assessment: Post-op Vital signs reviewed, Patient's Cardiovascular Status Stable, Respiratory Function Stable, Patent Airway and No signs of Nausea or vomiting  Post-op Vital Signs: Reviewed and stable  Complications: No apparent anesthesia complications

## 2015-12-02 ENCOUNTER — Encounter: Payer: Self-pay | Admitting: Gastroenterology

## 2015-12-10 ENCOUNTER — Other Ambulatory Visit: Payer: Self-pay | Admitting: Unknown Physician Specialty

## 2015-12-15 ENCOUNTER — Telehealth: Payer: Self-pay

## 2015-12-15 NOTE — Telephone Encounter (Signed)
Doing HQPAF forms and noticed patient missed diabetic f/up last month so I tried calling her to get that rescheduled. Patient did not answer but I left a VM asking for her to please return my call.

## 2015-12-15 NOTE — Telephone Encounter (Signed)
Pt called requesting Zoloft be refilled at [facility].

## 2015-12-16 NOTE — Telephone Encounter (Signed)
Called and left patient a voicemail asking for her to please return my call to schedule f/up.

## 2015-12-23 ENCOUNTER — Other Ambulatory Visit: Payer: Self-pay | Admitting: Family Medicine

## 2015-12-26 NOTE — Telephone Encounter (Signed)
Routing to provider  

## 2015-12-28 DIAGNOSIS — R69 Illness, unspecified: Secondary | ICD-10-CM | POA: Diagnosis not present

## 2015-12-28 NOTE — Telephone Encounter (Signed)
Called and left patient a VM asking for her to please return my call to schedule f/up. This was the 3rd attempt at reaching the patient so I will send her a letter.

## 2015-12-29 ENCOUNTER — Other Ambulatory Visit: Payer: Self-pay

## 2015-12-29 ENCOUNTER — Inpatient Hospital Stay: Payer: Medicare HMO | Attending: Oncology

## 2015-12-29 DIAGNOSIS — G4730 Sleep apnea, unspecified: Secondary | ICD-10-CM | POA: Diagnosis not present

## 2015-12-29 DIAGNOSIS — E1122 Type 2 diabetes mellitus with diabetic chronic kidney disease: Secondary | ICD-10-CM | POA: Diagnosis not present

## 2015-12-29 DIAGNOSIS — Z794 Long term (current) use of insulin: Secondary | ICD-10-CM | POA: Insufficient documentation

## 2015-12-29 DIAGNOSIS — F329 Major depressive disorder, single episode, unspecified: Secondary | ICD-10-CM | POA: Diagnosis not present

## 2015-12-29 DIAGNOSIS — N189 Chronic kidney disease, unspecified: Secondary | ICD-10-CM | POA: Insufficient documentation

## 2015-12-29 DIAGNOSIS — G629 Polyneuropathy, unspecified: Secondary | ICD-10-CM | POA: Insufficient documentation

## 2015-12-29 DIAGNOSIS — D472 Monoclonal gammopathy: Secondary | ICD-10-CM | POA: Insufficient documentation

## 2015-12-29 DIAGNOSIS — M1990 Unspecified osteoarthritis, unspecified site: Secondary | ICD-10-CM | POA: Insufficient documentation

## 2015-12-29 DIAGNOSIS — I129 Hypertensive chronic kidney disease with stage 1 through stage 4 chronic kidney disease, or unspecified chronic kidney disease: Secondary | ICD-10-CM | POA: Diagnosis not present

## 2015-12-29 DIAGNOSIS — E785 Hyperlipidemia, unspecified: Secondary | ICD-10-CM | POA: Insufficient documentation

## 2015-12-29 DIAGNOSIS — K219 Gastro-esophageal reflux disease without esophagitis: Secondary | ICD-10-CM | POA: Insufficient documentation

## 2015-12-29 DIAGNOSIS — R739 Hyperglycemia, unspecified: Secondary | ICD-10-CM | POA: Insufficient documentation

## 2015-12-29 DIAGNOSIS — Z7982 Long term (current) use of aspirin: Secondary | ICD-10-CM | POA: Diagnosis not present

## 2015-12-29 DIAGNOSIS — Z79899 Other long term (current) drug therapy: Secondary | ICD-10-CM | POA: Insufficient documentation

## 2015-12-29 DIAGNOSIS — E669 Obesity, unspecified: Secondary | ICD-10-CM | POA: Diagnosis not present

## 2015-12-29 LAB — BASIC METABOLIC PANEL
Anion gap: 10 (ref 5–15)
BUN: 21 mg/dL — AB (ref 6–20)
CALCIUM: 9.3 mg/dL (ref 8.9–10.3)
CO2: 22 mmol/L (ref 22–32)
CREATININE: 1.05 mg/dL — AB (ref 0.44–1.00)
Chloride: 106 mmol/L (ref 101–111)
GFR calc Af Amer: >60 mL/min (ref >60)
GFR, EST NON AFRICAN AMERICAN: 56 mL/min — AB (ref >60)
Glucose, Bld: 150 mg/dL — ABNORMAL HIGH (ref 65–99)
Potassium: 4.7 mmol/L (ref 3.5–5.1)
SODIUM: 138 mmol/L (ref 135–145)

## 2015-12-29 LAB — CBC WITH DIFFERENTIAL/PLATELET
Basophils Absolute: 0.1 10*3/uL (ref 0–0.1)
Basophils Relative: 1 %
EOS PCT: 3 %
Eosinophils Absolute: 0.2 10*3/uL (ref 0–0.7)
HCT: 36.7 % (ref 35.0–47.0)
Hemoglobin: 12.8 g/dL (ref 12.0–16.0)
LYMPHS ABS: 3.2 10*3/uL (ref 1.0–3.6)
Lymphocytes Relative: 41 %
MCH: 31.3 pg (ref 26.0–34.0)
MCHC: 34.8 g/dL (ref 32.0–36.0)
MCV: 90 fL (ref 80.0–100.0)
MONOS PCT: 7 %
Monocytes Absolute: 0.5 10*3/uL (ref 0.2–0.9)
Neutro Abs: 3.7 10*3/uL (ref 1.4–6.5)
Neutrophils Relative %: 48 %
Platelets: 270 10*3/uL (ref 150–440)
RBC: 4.08 MIL/uL (ref 3.80–5.20)
RDW: 14.8 % — AB (ref 11.5–14.5)
WBC: 7.8 10*3/uL (ref 3.6–11.0)

## 2015-12-30 ENCOUNTER — Other Ambulatory Visit: Payer: Self-pay

## 2015-12-30 LAB — PROTEIN ELECTROPHORESIS, SERUM
A/G RATIO SPE: 1.2 (ref 0.7–1.7)
ALPHA-2-GLOBULIN: 0.9 g/dL (ref 0.4–1.0)
Albumin ELP: 4.1 g/dL (ref 2.9–4.4)
Alpha-1-Globulin: 0.3 g/dL (ref 0.0–0.4)
BETA GLOBULIN: 1 g/dL (ref 0.7–1.3)
Gamma Globulin: 1.3 g/dL (ref 0.4–1.8)
Globulin, Total: 3.4 g/dL (ref 2.2–3.9)
M-SPIKE, %: 0.7 g/dL — AB
Total Protein ELP: 7.5 g/dL (ref 6.0–8.5)

## 2015-12-30 LAB — PROTEIN ELECTRO, RANDOM URINE
ALPHA-1-GLOBULIN, U: 2.3 %
Albumin ELP, Urine: 39.1 %
Alpha-2-Globulin, U: 13.8 %
Beta Globulin, U: 18.1 %
GAMMA GLOBULIN, U: 26.7 %
Total Protein, Urine: 11 mg/dL

## 2015-12-30 LAB — KAPPA/LAMBDA LIGHT CHAINS
KAPPA FREE LGHT CHN: 37.4 mg/L — AB (ref 3.3–19.4)
KAPPA, LAMDA LIGHT CHAIN RATIO: 1.89 — AB (ref 0.26–1.65)
LAMDA FREE LIGHT CHAINS: 19.8 mg/L (ref 5.7–26.3)

## 2016-01-04 DIAGNOSIS — D472 Monoclonal gammopathy: Secondary | ICD-10-CM | POA: Insufficient documentation

## 2016-01-04 NOTE — Progress Notes (Signed)
[facility]  [phone] [phone]) [phone]  ID: Kara Leach OB: 10-23-1950  MR#: [id number]  OFB#:[id number]  Patient Care Team: Kathrine Haddock, NP as PCP - General (Nurse Practitioner)  CHIEF COMPLAINT: MGUS  INTERVAL HISTORY: Patient returns to clinic today for repeat laboratory work and further evaluation. She continues to feel well and remains asymptomatic. She denies any recent fevers or illnesses. She has a good appetite and denies weight loss.  She has a mild peripheral neuropathy, but no other neurologic complaints.  She denies any bony pain.  She has no chest pain or shortness of breath.  She denies any nausea, vomiting, constipation, or diarrhea.  She has no urinary complaints.  Patient offers no specific complaints today.  REVIEW OF SYSTEMS:   Review of Systems  Constitutional: Negative.  Negative for fever, malaise/fatigue and weight loss.  Respiratory: Negative.  Negative for shortness of breath.   Cardiovascular: Negative.  Negative for chest pain.  Gastrointestinal: Negative.   Genitourinary: Negative.   Musculoskeletal: Negative.   Neurological: Positive for sensory change. Negative for weakness.  Psychiatric/Behavioral: Negative.     As per HPI. Otherwise, a complete review of systems is negative.  PAST MEDICAL HISTORY: Past Medical History:  Diagnosis Date  . Allergy   . Arthritis    Osteoarthritis  . Chronic kidney disease   . Depression   . Diabetes mellitus without complication ([place])   . Diabetic neuropathy (HCC)    feet  . GERD (gastroesophageal reflux disease)   . Headache    sinus  . Hyperlipidemia   . Hypertension   . Last menstrual period (LMP) > 10 days ago 1986  . Obesity   . Renal insufficiency    Stage 2 kidney disease  . Seizures ([place])    due to high blood sugars. last time = 09/2014  . Sleep apnea    has CPAP, can't tolerate  . Wears hearing aid    right side  . Wrist weakness    left - after  fracture and repair    PAST SURGICAL HISTORY: Past Surgical History:  Procedure Laterality Date  . ABDOMINAL HYSTERECTOMY    . APPENDECTOMY    . CHOLECYSTECTOMY    . COLONOSCOPY WITH PROPOFOL N/A 12/01/2015   Procedure: COLONOSCOPY WITH PROPOFOL;  Surgeon: [name], MD;  Location: [facility];  Service: Endoscopy;  Laterality: N/A;  Diabetic - insulin Sleep apnea  . PATELLA FRACTURE SURGERY Left   . WRIST FRACTURE SURGERY Left    Steel plate    FAMILY HISTORY Family History  Problem Relation Age of Onset  . Diabetes Mother   . Heart disease Mother   . Hypertension Mother   . Diabetes Sister   . Cancer Father     stomach  . Arthritis Sister   . Stroke Sister   . Hypertension Sister        ADVANCED DIRECTIVES:    HEALTH MAINTENANCE: Social History  Substance Use Topics  . Smoking status: Never Smoker  . Smokeless tobacco: Never Used  . Alcohol use 0.0 oz/week     Comment: occasionally holiday, weddings     Colonoscopy:  PAP:  Bone density:  Lipid panel:  Allergies  Allergen Reactions  . Ibuprofen Other (See Comments)    "ringing in ears"  . Strawberry Extract Swelling    Lips swell and rash over body    Current Outpatient Prescriptions  Medication Sig Dispense Refill  . allopurinol (ZYLOPRIM) 300 MG tablet Take 1  tablet (300 mg total) by mouth daily. 30 tablet 12  . aspirin EC 81 MG tablet Take 81 mg by mouth daily.    . Calcium Carb-Cholecalciferol (CALCIUM 600 + D PO) Take 1 tablet by mouth 2 (two) times daily.    . enalapril (VASOTEC) 2.5 MG tablet Take 1 tablet (2.5 mg total) by mouth daily. 90 tablet 1  . fluticasone (FLONASE) 50 MCG/ACT nasal spray Place 2 sprays into both nostrils daily as needed for allergies.     Marland Kitchen insulin aspart (NOVOLOG) 100 UNIT/ML FlexPen Inject 6 Units into the skin 3 (three) times daily with meals. 15 mL 11  . Insulin Pen Needle 32G X 4 MM MISC 1 Units by Does not apply route every morning. Pen needles 90 each 3    . LEVEMIR FLEXTOUCH 100 UNIT/ML Pen INJECT 60 UNITS UNDER THE SKIN EVERY DAY AT 10PM 15 mL 0  . metoprolol succinate (TOPROL-XL) 25 MG 24 hr tablet Take 1 tablet (25 mg total) by mouth daily. 30 tablet 12  . ranitidine (ZANTAC) 150 MG tablet Take 150 mg by mouth 2 (two) times daily.    . sertraline (ZOLOFT) 100 MG tablet TAKE ONE TABLET BY MOUTH EVERY MORNING 90 tablet 0  . Specialty Vitamins Products (VITA-RX DIABETIC VITAMIN) CAPS Take 1 packet by mouth daily. Diabetic Vitamins - 6 or 7 in 1 pack    . VICTOZA 18 MG/3ML SOPN Inject 1.2 mg into the skin daily.  0  . amLODipine (NORVASC) 5 MG tablet TAKE 1 TABLET BY MOUTH EVERY DAY 90 tablet 0  . simvastatin (ZOCOR) 40 MG tablet TAKE 1 TABLET BY MOUTH EVERY NIGHT AT BEDTIME 90 tablet 0   No current facility-administered medications for this visit.     OBJECTIVE: Vitals:   01/05/16 1040  BP: 120/85  Pulse: 99  Resp: 18  Temp: 97.4 F (36.3 C)     Body mass index is 31.34 kg/m.    ECOG FS:0 - Asymptomatic  General: Well-developed, well-nourished, no acute distress. Eyes: Pink conjunctiva, anicteric sclera. Lungs: Clear to auscultation bilaterally. Heart: Regular rate and rhythm. No rubs, murmurs, or gallops. Abdomen: Soft, nontender, nondistended. No organomegaly noted, normoactive bowel sounds. Musculoskeletal: No edema, cyanosis, or clubbing. Neuro: Alert, answering all questions appropriately. Cranial nerves grossly intact. Skin: No rashes or petechiae noted. Psych: Normal affect.   LAB RESULTS:  Lab Results  Component Value Date   NA 138 12/29/2015   K 4.7 12/29/2015   CL 106 12/29/2015   CO2 22 12/29/2015   GLUCOSE 150 (H) 12/29/2015   BUN 21 (H) 12/29/2015   CREATININE 1.05 (H) 12/29/2015   CALCIUM 9.3 12/29/2015   PROT 8.4 (H) 08/12/2015   ALBUMIN 4.0 08/12/2015   AST 35 08/12/2015   ALT 15 08/12/2015   ALKPHOS 132 (H) 08/12/2015   BILITOT 0.8 08/12/2015   GFRNONAA 56 (L) 12/29/2015   GFRAA >60 12/29/2015     Lab Results  Component Value Date   WBC 7.8 12/29/2015   NEUTROABS 3.7 12/29/2015   HGB 12.8 12/29/2015   HCT 36.7 12/29/2015   MCV 90.0 12/29/2015   PLT 270 12/29/2015   Lab Results  Component Value Date   TOTALPROTELP 7.5 12/29/2015   ALBUMINELP 4.1 12/29/2015   A1GS 0.3 12/29/2015   A2GS 0.9 12/29/2015   BETS 1.0 12/29/2015   GAMS 1.3 12/29/2015   MSPIKE 0.7 (H) 12/29/2015   SPEI Comment 12/29/2015     STUDIES: No results found.  ASSESSMENT: MGUS  PLAN:    1.  MGUS: Patient's most recent M spike is unchanged at 0.7.  It was 0.6 six months ago. She has no evidence of endorgan damage and her kidney function is improved.  Previously, metastatic bone survey was reported as negative.  This does not need to be repeated unless there is concern of progression of disease.  Patient does not require bone marrow biopsy at this time.  She will return to clinic in 6 months for repeat laboratory work and routine evaluation.   2.  Chronic renal insufficiency: Improved.  Treatment per Dr. Holley Raring. 3.  Hyperglycemia:  Blood sugars are better controlled.  Continue current diabetic medications. Treatment per PCP. 4.  Peripheral neuropathy: Secondary to diabetes. Monitor.   Patient expressed understanding and was in agreement with this plan. She also understands that She can call clinic at any time with any questions, concerns, or complaints.   Lloyd Huger, MD   01/12/2016 5:13 PM

## 2016-01-05 ENCOUNTER — Inpatient Hospital Stay (HOSPITAL_BASED_OUTPATIENT_CLINIC_OR_DEPARTMENT_OTHER): Payer: Medicare HMO | Admitting: Oncology

## 2016-01-05 VITALS — BP 120/85 | HR 99 | Temp 97.4°F | Resp 18 | Wt 156.7 lb

## 2016-01-05 DIAGNOSIS — G629 Polyneuropathy, unspecified: Secondary | ICD-10-CM | POA: Diagnosis not present

## 2016-01-05 DIAGNOSIS — Z79899 Other long term (current) drug therapy: Secondary | ICD-10-CM

## 2016-01-05 DIAGNOSIS — E785 Hyperlipidemia, unspecified: Secondary | ICD-10-CM | POA: Diagnosis not present

## 2016-01-05 DIAGNOSIS — N189 Chronic kidney disease, unspecified: Secondary | ICD-10-CM | POA: Diagnosis not present

## 2016-01-05 DIAGNOSIS — K219 Gastro-esophageal reflux disease without esophagitis: Secondary | ICD-10-CM | POA: Diagnosis not present

## 2016-01-05 DIAGNOSIS — R739 Hyperglycemia, unspecified: Secondary | ICD-10-CM

## 2016-01-05 DIAGNOSIS — M1990 Unspecified osteoarthritis, unspecified site: Secondary | ICD-10-CM | POA: Diagnosis not present

## 2016-01-05 DIAGNOSIS — I129 Hypertensive chronic kidney disease with stage 1 through stage 4 chronic kidney disease, or unspecified chronic kidney disease: Secondary | ICD-10-CM | POA: Diagnosis not present

## 2016-01-05 DIAGNOSIS — D472 Monoclonal gammopathy: Secondary | ICD-10-CM | POA: Diagnosis not present

## 2016-01-05 DIAGNOSIS — E1122 Type 2 diabetes mellitus with diabetic chronic kidney disease: Secondary | ICD-10-CM | POA: Diagnosis not present

## 2016-01-05 NOTE — Progress Notes (Signed)
States is feeling well. Offers no complaints. 

## 2016-01-09 ENCOUNTER — Other Ambulatory Visit: Payer: Self-pay | Admitting: Unknown Physician Specialty

## 2016-01-13 ENCOUNTER — Other Ambulatory Visit: Payer: Self-pay | Admitting: Unknown Physician Specialty

## 2016-01-19 ENCOUNTER — Other Ambulatory Visit: Payer: Self-pay | Admitting: Unknown Physician Specialty

## 2016-01-23 ENCOUNTER — Other Ambulatory Visit: Payer: Self-pay | Admitting: Unknown Physician Specialty

## 2016-01-30 ENCOUNTER — Other Ambulatory Visit: Payer: Self-pay | Admitting: Unknown Physician Specialty

## 2016-02-17 ENCOUNTER — Other Ambulatory Visit: Payer: Self-pay | Admitting: Unknown Physician Specialty

## 2016-02-21 ENCOUNTER — Encounter: Payer: Self-pay | Admitting: Family Medicine

## 2016-02-21 ENCOUNTER — Ambulatory Visit (INDEPENDENT_AMBULATORY_CARE_PROVIDER_SITE_OTHER): Payer: Medicare HMO | Admitting: Family Medicine

## 2016-02-21 VITALS — BP 131/81 | HR 94 | Temp 98.2°F | Ht 59.0 in | Wt 157.6 lb

## 2016-02-21 DIAGNOSIS — Z1231 Encounter for screening mammogram for malignant neoplasm of breast: Secondary | ICD-10-CM

## 2016-02-21 DIAGNOSIS — J019 Acute sinusitis, unspecified: Secondary | ICD-10-CM

## 2016-02-21 DIAGNOSIS — Z1239 Encounter for other screening for malignant neoplasm of breast: Secondary | ICD-10-CM

## 2016-02-21 MED ORDER — BENZONATATE 100 MG PO CAPS: 100.0000 mg | ORAL_CAPSULE | Freq: Two times a day (BID) | ORAL | 0 refills | Status: DC | PRN

## 2016-02-21 MED ORDER — AMOXICILLIN-POT CLAVULANATE 875-125 MG PO TABS
1.0000 | ORAL_TABLET | Freq: Two times a day (BID) | ORAL | 0 refills | Status: DC
Start: 2016-02-21 — End: 2016-05-02

## 2016-02-21 NOTE — Progress Notes (Signed)
BP 131/81 (BP Location: Left Arm, Patient Position: Sitting, Cuff Size: Normal)   Pulse 94   Temp 98.2 F (36.8 C)   Ht 4\' 11"  (1.499 m)   Wt 157 lb 9.6 oz (71.5 kg)   LMP  (LMP Unknown)   SpO2 98%   BMI 31.83 kg/m    Subjective:    Patient ID: Kara Leach, female    DOB: 12-10-1950, 65 y.o.   MRN: [id number]  HPI: Kara Leach is a 65 y.o. female  Chief Complaint  Patient presents with  . Cough    Productive. Yellow in color. X's 1 week.  . Shortness of Breath  . Headache  . Nasal Congestion  Patient with cough cold fever or chills aches and pains ongoing for over a week now's tried over-the-counter medications with no real relief. Got up from her little grandson. Has had pressure especially and worsening congestion with bending over and frontal sinus pressure and pain.  Relevant past medical, surgical, family and social history reviewed and updated as indicated. Interim medical history since our last visit reviewed. Allergies and medications reviewed and updated.  Review of Systems  Constitutional: Positive for chills, diaphoresis, fatigue and fever.  HENT: Positive for congestion, ear pain, rhinorrhea, sinus pain, sinus pressure, sneezing and sore throat.   Respiratory: Positive for cough. Negative for wheezing and stridor.   Cardiovascular: Negative.     Per HPI unless specifically indicated above     Objective:    BP 131/81 (BP Location: Left Arm, Patient Position: Sitting, Cuff Size: Normal)   Pulse 94   Temp 98.2 F (36.8 C)   Ht 4\' 11"  (1.499 m)   Wt 157 lb 9.6 oz (71.5 kg)   LMP  (LMP Unknown)   SpO2 98%   BMI 31.83 kg/m   Wt Readings from Last 3 Encounters:  02/21/16 157 lb 9.6 oz (71.5 kg)  01/05/16 156 lb 12 oz (71.1 kg)  12/01/15 151 lb (68.5 kg)    Physical Exam  Constitutional: She is oriented to person, place, and time. She appears well-developed and well-nourished. No distress.  HENT:  Head: Normocephalic and atraumatic.  Right  Ear: Hearing and external ear normal.  Left Ear: Hearing and external ear normal.  Nose: Nose normal.  Mouth/Throat: Oropharyngeal exudate present.  Eyes: Conjunctivae and lids are normal. Right eye exhibits no discharge. Left eye exhibits no discharge. No scleral icterus.  Neck: No thyromegaly present.  Cardiovascular: Normal rate, regular rhythm and normal heart sounds.   Pulmonary/Chest: Effort normal and breath sounds normal. No respiratory distress.  Musculoskeletal: Normal range of motion.  Lymphadenopathy:    She has no cervical adenopathy.  Neurological: She is alert and oriented to person, place, and time.  Skin: Skin is intact. No rash noted.  Psychiatric: She has a normal mood and affect. Her speech is normal and behavior is normal. Judgment and thought content normal. Cognition and memory are normal.    Results for orders placed or performed in visit on 12/29/15  Protein Electro, Random Urine  Result Value Ref Range   Total Protein, Urine 11.0 Not Estab. mg/dL   Albumin ELP, Urine 39.1 %   Alpha-1-Globulin, U 2.3 %   Alpha-2-Globulin, U 13.8 %   Beta Globulin, U 18.1 %   Gamma Globulin, U 26.7 %   M Component, Ur Not Observed Not Observed %   PLEASE NOTE: Comment       Assessment & Plan:   Problem List Items  Addressed This Visit    None    Visit Diagnoses    Breast cancer screening    -  Primary   Relevant Orders   MM Digital Screening   Acute sinusitis, recurrence not specified, unspecified location       Discussed sinusitis care and treatment will use Augmentin patient had given on side effects and prevention, Tessalon Perles use of over-the-counter medications    Relevant Medications   amoxicillin-clavulanate (AUGMENTIN) 875-125 MG tablet   benzonatate (TESSALON) 100 MG capsule       Follow up plan: Return for As scheduled.

## 2016-03-08 ENCOUNTER — Other Ambulatory Visit: Payer: Self-pay | Admitting: Unknown Physician Specialty

## 2016-03-22 ENCOUNTER — Other Ambulatory Visit: Payer: Self-pay | Admitting: Unknown Physician Specialty

## 2016-03-22 NOTE — Telephone Encounter (Signed)
Last OV: 02/21/16 Next OV: None scheduled.    Lab Results  Component Value Date   HGBA1C 12.7 (A) 07/02/2014

## 2016-03-23 NOTE — Telephone Encounter (Signed)
Pt needs f/u appt

## 2016-04-04 ENCOUNTER — Other Ambulatory Visit: Payer: Self-pay | Admitting: Unknown Physician Specialty

## 2016-04-17 ENCOUNTER — Other Ambulatory Visit: Payer: Self-pay

## 2016-04-17 MED ORDER — AMLODIPINE BESYLATE 5 MG PO TABS: 5.0000 mg | ORAL_TABLET | Freq: Every day | ORAL | 0 refills | Status: DC

## 2016-04-17 MED ORDER — ALLOPURINOL 300 MG PO TABS: 300.0000 mg | ORAL_TABLET | Freq: Every day | ORAL | 3 refills | Status: DC

## 2016-04-17 NOTE — Telephone Encounter (Signed)
Pt needs to be seen

## 2016-04-17 NOTE — Telephone Encounter (Signed)
Last OV: 02/21/16 Next OV: None scheduled  BMP Latest Ref Rng & Units 12/29/2015 08/15/2015 08/14/2015  Glucose 65 - 99 mg/dL 150(H) 85 153(H)  BUN 6 - 20 mg/dL 21(H) <5(L) <5(L)  Creatinine 0.44 - 1.00 mg/dL 1.05(H) 0.78 0.79  BUN/Creat Ratio 12 - 28 - - -  Sodium 135 - 145 mmol/L 138 138 137  Potassium 3.5 - 5.1 mmol/L 4.7 3.7 2.8(LL)  Chloride 101 - 111 mmol/L 106 108 106  CO2 22 - 32 mmol/L 22 25 23   Calcium 8.9 - 10.3 mg/dL 9.3 8.1(L) 7.8(L)    Pt switched pharmacy.

## 2016-04-17 NOTE — Telephone Encounter (Signed)
Letter generated and sent.

## 2016-04-19 ENCOUNTER — Other Ambulatory Visit: Payer: Self-pay | Admitting: Unknown Physician Specialty

## 2016-04-25 ENCOUNTER — Other Ambulatory Visit: Payer: Self-pay

## 2016-04-25 MED ORDER — INSULIN DETEMIR 100 UNIT/ML FLEXPEN: 60.0000 [IU] | PEN_INJECTOR | Freq: Every day | SUBCUTANEOUS | 0 refills | Status: DC

## 2016-04-25 NOTE — Telephone Encounter (Signed)
Patient has f/up scheduled for 05/02/16.

## 2016-05-02 ENCOUNTER — Encounter: Payer: Self-pay | Admitting: Unknown Physician Specialty

## 2016-05-02 ENCOUNTER — Ambulatory Visit (INDEPENDENT_AMBULATORY_CARE_PROVIDER_SITE_OTHER): Payer: Medicare HMO | Admitting: Unknown Physician Specialty

## 2016-05-02 ENCOUNTER — Encounter (INDEPENDENT_AMBULATORY_CARE_PROVIDER_SITE_OTHER): Payer: Self-pay

## 2016-05-02 ENCOUNTER — Telehealth: Payer: Self-pay | Admitting: Unknown Physician Specialty

## 2016-05-02 VITALS — BP 112/73 | HR 86 | Temp 97.9°F | Ht 59.0 in | Wt 159.4 lb

## 2016-05-02 DIAGNOSIS — E7800 Pure hypercholesterolemia, unspecified: Secondary | ICD-10-CM | POA: Diagnosis not present

## 2016-05-02 DIAGNOSIS — I10 Essential (primary) hypertension: Secondary | ICD-10-CM | POA: Diagnosis not present

## 2016-05-02 DIAGNOSIS — E1122 Type 2 diabetes mellitus with diabetic chronic kidney disease: Secondary | ICD-10-CM | POA: Diagnosis not present

## 2016-05-02 DIAGNOSIS — Z794 Long term (current) use of insulin: Secondary | ICD-10-CM | POA: Diagnosis not present

## 2016-05-02 DIAGNOSIS — N183 Chronic kidney disease, stage 3 (moderate): Secondary | ICD-10-CM

## 2016-05-02 DIAGNOSIS — I129 Hypertensive chronic kidney disease with stage 1 through stage 4 chronic kidney disease, or unspecified chronic kidney disease: Secondary | ICD-10-CM | POA: Diagnosis not present

## 2016-05-02 LAB — BAYER DCA HB A1C WAIVED: HB A1C (BAYER DCA - WAIVED): 6.4 % (ref <7.0)

## 2016-05-02 MED ORDER — SERTRALINE HCL 100 MG PO TABS: 100.0000 mg | ORAL_TABLET | Freq: Every morning | ORAL | 1 refills | Status: DC

## 2016-05-02 MED ORDER — ENALAPRIL MALEATE 2.5 MG PO TABS: 2.5000 mg | ORAL_TABLET | Freq: Every day | ORAL | 1 refills | Status: DC

## 2016-05-02 MED ORDER — SIMVASTATIN 40 MG PO TABS: 40.0000 mg | ORAL_TABLET | Freq: Every day | ORAL | 1 refills | Status: DC

## 2016-05-02 MED ORDER — METOPROLOL SUCCINATE ER 25 MG PO TB24: 25.0000 mg | ORAL_TABLET | Freq: Every day | ORAL | 1 refills | Status: DC

## 2016-05-02 MED ORDER — VICTOZA 18 MG/3ML ~~LOC~~ SOPN: 1.2000 mg | PEN_INJECTOR | Freq: Every day | SUBCUTANEOUS | 1 refills | Status: DC

## 2016-05-02 MED ORDER — INSULIN DETEMIR 100 UNIT/ML FLEXPEN: 60.0000 [IU] | PEN_INJECTOR | Freq: Every day | SUBCUTANEOUS | 0 refills | Status: DC

## 2016-05-02 MED ORDER — AMLODIPINE BESYLATE 5 MG PO TABS: 5.0000 mg | ORAL_TABLET | Freq: Every day | ORAL | 1 refills | Status: DC

## 2016-05-02 NOTE — Assessment & Plan Note (Signed)
Check lipid panel today 

## 2016-05-02 NOTE — Assessment & Plan Note (Signed)
Stable, continue present medications.   

## 2016-05-02 NOTE — Progress Notes (Addendum)
BP 112/73 (BP Location: Left Arm, Patient Position: Sitting, Cuff Size: Large)   Pulse 86   Temp 97.9 F (36.6 C)   Ht 4\' 11"  (1.499 m)   Wt 159 lb 6.4 oz (72.3 kg)   LMP  (LMP Unknown)   SpO2 97%   BMI 32.19 kg/m    Subjective:    Patient ID: Kara Leach, female    DOB: 04-02-1950, 66 y.o.   MRN: [id number]  HPI: Kara Leach is a 66 y.o. female  Chief Complaint  Patient presents with  . Diabetes  . Hyperlipidemia  . Hypertension  . Depression  . Mammogram    pt states she has mammogram scheduled for 05/29/16   Diabetes: 60 u Levimir at night and 16 u of Novolog with meals.  Taking 1.2 of Victoza.   No hypoglycemic episodes No hyperglycemic episodes Feet problems: none Blood Sugars averaging: 118 this AM eye exam within last year Last Hgb A1C: 7.6  Hypertension  Using medications without difficulty Average home BPs   Using medication without problems or lightheadedness No chest pain with exertion or shortness of breath No Edema  Elevated Cholesterol Using medications without problems No Muscle aches  D[facility]Ex[facility]  Does treadmill elliptical.  Working on cutting back on carbohydrates.    Depression "I think I'm doing better."  Has trouble sleeping Depression screen [facility] 2/9 05/02/2016 08/20/2014  Decreased Interest 2 0  Down, Depressed, Hopeless 2 0  PHQ - 2 Score 4 0  Altered sleeping 2 -  Tired, decreased energy 2 -  Change in appetite 2 -  Feeling bad or failure about yourself  2 -  Trouble concentrating 0 -  Moving slowly or fidgety/restless 0 -  Suicidal thoughts 0 -  PHQ-9 Score 12 -    Relevant past medical, surgical, family and social history reviewed and updated as indicated. Interim medical history since our last visit reviewed. Allergies and medications reviewed and updated.  Review of Systems  Per HPI unless specifically indicated above     Objective:    BP 112/73 (BP Location: Left Arm, Patient Position:  Sitting, Cuff Size: Large)   Pulse 86   Temp 97.9 F (36.6 C)   Ht 4\' 11"  (1.499 m)   Wt 159 lb 6.4 oz (72.3 kg)   LMP  (LMP Unknown)   SpO2 97%   BMI 32.19 kg/m   Wt Readings from Last 3 Encounters:  05/02/16 159 lb 6.4 oz (72.3 kg)  02/21/16 157 lb 9.6 oz (71.5 kg)  01/05/16 156 lb 12 oz (71.1 kg)    Physical Exam  Constitutional: She is oriented to person, place, and time. She appears well-developed and well-nourished. No distress.  HENT:  Head: Normocephalic and atraumatic.  Eyes: Conjunctivae and lids are normal. Right eye exhibits no discharge. Left eye exhibits no discharge. No scleral icterus.  Neck: Normal range of motion. Neck supple. No JVD present. Carotid bruit is not present.  Cardiovascular: Normal rate, regular rhythm and normal heart sounds.   Pulmonary/Chest: Effort normal and breath sounds normal.  Abdominal: Normal appearance. There is no splenomegaly or hepatomegaly.  Musculoskeletal: Normal range of motion.  Neurological: She is alert and oriented to person, place, and time.  Skin: Skin is warm, dry and intact. No rash noted. No pallor.  Psychiatric: She has a normal mood and affect. Her behavior is normal. Judgment and thought content normal.      Assessment & Plan:   Problem  List Items Addressed This Visit      Unprioritized   Controlled type 2 diabetes mellitus with chronic kidney disease, with long-term current use of insulin ([place])    Hgb A1C is 6.4%..  Continue present medications.        Relevant Medications   enalapril (VASOTEC) 2.5 MG tablet   simvastatin (ZOCOR) 40 MG tablet   VICTOZA 18 MG/3ML SOPN   Insulin Detemir (LEVEMIR FLEXTOUCH) 100 UNIT/ML Pen   Other Relevant Orders   Comprehensive metabolic panel   Lipid Panel w/o Chol/HDL Ratio   Bayer DCA Hb A1c Waived (Completed)   HTN (hypertension)    Stable, continue present medications.        Relevant Medications   enalapril (VASOTEC) 2.5 MG tablet   simvastatin (ZOCOR) 40 MG  tablet   amLODipine (NORVASC) 5 MG tablet   metoprolol succinate (TOPROL-XL) 25 MG 24 hr tablet   Hyperlipidemia - Primary    Check lipid panel today      Relevant Medications   enalapril (VASOTEC) 2.5 MG tablet   simvastatin (ZOCOR) 40 MG tablet   amLODipine (NORVASC) 5 MG tablet   metoprolol succinate (TOPROL-XL) 25 MG 24 hr tablet   Hypertensive CKD (chronic kidney disease)       Follow up plan: Return in about 6 months (around 10/30/2016) for physical.

## 2016-05-02 NOTE — Telephone Encounter (Signed)
Patient has changed pharmacies to [place].  Called and had [facility] to transfer the scripts to [place] per [place].  Santiago Glad

## 2016-05-02 NOTE — Assessment & Plan Note (Signed)
Hgb A1C is 6.4%..  Continue present medications.

## 2016-05-03 ENCOUNTER — Encounter: Payer: Self-pay | Admitting: Unknown Physician Specialty

## 2016-05-03 LAB — COMPREHENSIVE METABOLIC PANEL
ALT: 26 IU/L (ref 0–32)
AST: 31 IU/L (ref 0–40)
Albumin/Globulin Ratio: 1.6 (ref 1.2–2.2)
Albumin: 4.4 g/dL (ref 3.6–4.8)
Alkaline Phosphatase: 105 IU/L (ref 39–117)
BUN/Creatinine Ratio: 15 (ref 12–28)
BUN: 16 mg/dL (ref 8–27)
Bilirubin Total: 0.3 mg/dL (ref 0.0–1.2)
CALCIUM: 9.4 mg/dL (ref 8.7–10.3)
CO2: 19 mmol/L (ref 18–29)
CREATININE: 1.1 mg/dL — AB (ref 0.57–1.00)
Chloride: 104 mmol/L (ref 96–106)
GFR calc Af Amer: 62 mL/min/{1.73_m2} (ref >59)
GFR, EST NON AFRICAN AMERICAN: 54 mL/min/{1.73_m2} — AB (ref >59)
GLOBULIN, TOTAL: 2.7 g/dL (ref 1.5–4.5)
Glucose: 114 mg/dL — ABNORMAL HIGH (ref 65–99)
Potassium: 4.7 mmol/L (ref 3.5–5.2)
SODIUM: 143 mmol/L (ref 134–144)
Total Protein: 7.1 g/dL (ref 6.0–8.5)

## 2016-05-03 LAB — LIPID PANEL W/O CHOL/HDL RATIO
Cholesterol, Total: 134 mg/dL (ref 100–199)
HDL: 41 mg/dL (ref >39)
LDL CALC: 53 mg/dL (ref 0–99)
TRIGLYCERIDES: 200 mg/dL — AB (ref 0–149)
VLDL Cholesterol Cal: 40 mg/dL (ref 5–40)

## 2016-05-04 ENCOUNTER — Other Ambulatory Visit: Payer: Self-pay | Admitting: Unknown Physician Specialty

## 2016-05-20 ENCOUNTER — Other Ambulatory Visit: Payer: Self-pay | Admitting: Unknown Physician Specialty

## 2016-06-01 ENCOUNTER — Telehealth: Payer: Self-pay | Admitting: Unknown Physician Specialty

## 2016-06-01 DIAGNOSIS — R69 Illness, unspecified: Secondary | ICD-10-CM | POA: Diagnosis not present

## 2016-06-01 NOTE — Telephone Encounter (Signed)
Order form for strips filled out. Will get provider signature and fax to pharmacy.

## 2016-06-01 NOTE — Telephone Encounter (Signed)
Order signed by provider and faxed to pharmacy.

## 2016-06-01 NOTE — Telephone Encounter (Signed)
Called and left patient a VM letting her know that the order was faxed to her pharmacy.

## 2016-06-01 NOTE — Telephone Encounter (Signed)
Patient states that she has a new One Touch Verio Glucose Meter and needs an RX for the test strips called in to [facility] in [place].

## 2016-06-14 ENCOUNTER — Other Ambulatory Visit: Payer: Self-pay | Admitting: Unknown Physician Specialty

## 2016-06-18 ENCOUNTER — Ambulatory Visit
Admission: RE | Admit: 2016-06-18 | Discharge: 2016-06-18 | Disposition: A | Discharge status: Home / Self Care | Payer: Medicare HMO | Source: Ambulatory Visit | Attending: Family Medicine | Admitting: Family Medicine

## 2016-06-18 DIAGNOSIS — Z1231 Encounter for screening mammogram for malignant neoplasm of breast: Secondary | ICD-10-CM | POA: Insufficient documentation

## 2016-06-18 DIAGNOSIS — Z1239 Encounter for other screening for malignant neoplasm of breast: Secondary | ICD-10-CM

## 2016-06-21 ENCOUNTER — Other Ambulatory Visit: Payer: Self-pay | Admitting: Family Medicine

## 2016-06-21 DIAGNOSIS — R928 Other abnormal and inconclusive findings on diagnostic imaging of breast: Secondary | ICD-10-CM

## 2016-06-21 DIAGNOSIS — N6489 Other specified disorders of breast: Secondary | ICD-10-CM

## 2016-06-27 ENCOUNTER — Other Ambulatory Visit: Payer: Self-pay | Admitting: Unknown Physician Specialty

## 2016-06-28 ENCOUNTER — Ambulatory Visit
Admission: RE | Admit: 2016-06-28 | Discharge: 2016-06-28 | Disposition: A | Discharge status: Home / Self Care | Payer: Medicare HMO | Source: Ambulatory Visit | Attending: Family Medicine | Admitting: Family Medicine

## 2016-06-28 DIAGNOSIS — N6489 Other specified disorders of breast: Secondary | ICD-10-CM | POA: Insufficient documentation

## 2016-06-28 DIAGNOSIS — R928 Other abnormal and inconclusive findings on diagnostic imaging of breast: Secondary | ICD-10-CM

## 2016-06-28 DIAGNOSIS — R69 Illness, unspecified: Secondary | ICD-10-CM | POA: Diagnosis not present

## 2016-06-29 ENCOUNTER — Telehealth: Payer: Self-pay | Admitting: Family Medicine

## 2016-06-29 DIAGNOSIS — R928 Other abnormal and inconclusive findings on diagnostic imaging of breast: Secondary | ICD-10-CM

## 2016-06-29 NOTE — Telephone Encounter (Signed)
Patient needs breast bx. Referral to Dr. Bary Castilla made today- Kristin Bruins, can we expedite this please?

## 2016-07-02 ENCOUNTER — Telehealth: Payer: Self-pay | Admitting: General Surgery

## 2016-07-02 ENCOUNTER — Encounter: Payer: Self-pay | Admitting: *Deleted

## 2016-07-02 NOTE — Telephone Encounter (Signed)
MESSAGE LEFT FOR PATIENT TO CALL AND SCHEDULE APPOINTMENT FOR DR BYRNETT FOR 07-03-16 @ 8:30 AM COME IN @ 8:00 FOR PW)REF'D BY DR Park Liter FOR CAT 4 MAMMO & MAMMO @ [facility] Selinda Flavin

## 2016-07-02 NOTE — Telephone Encounter (Signed)
ASA has left message for patient to come in tomorrow morning at 8:30.

## 2016-07-02 NOTE — Telephone Encounter (Signed)
I'VE LEFT MESSAGES FOR BOTH  Kara Leach & Kara Leach TO HAVE THEIR MOTHER CALL THE OFFICE TODAY.

## 2016-07-02 NOTE — Telephone Encounter (Signed)
FYI- this happened while you were gone.

## 2016-07-03 ENCOUNTER — Ambulatory Visit
Admission: RE | Admit: 2016-07-03 | Discharge: 2016-07-03 | Disposition: A | Discharge status: Home / Self Care | Payer: Medicare HMO | Source: Ambulatory Visit | Attending: General Surgery | Admitting: General Surgery

## 2016-07-03 ENCOUNTER — Inpatient Hospital Stay: Payer: Self-pay

## 2016-07-03 ENCOUNTER — Encounter: Payer: Self-pay | Admitting: General Surgery

## 2016-07-03 ENCOUNTER — Ambulatory Visit (INDEPENDENT_AMBULATORY_CARE_PROVIDER_SITE_OTHER): Payer: Medicare HMO | Admitting: General Surgery

## 2016-07-03 VITALS — BP 152/82 | HR 88 | Resp 14 | Ht 59.0 in | Wt 166.0 lb

## 2016-07-03 DIAGNOSIS — N6320 Unspecified lump in the left breast, unspecified quadrant: Secondary | ICD-10-CM

## 2016-07-03 DIAGNOSIS — N6323 Unspecified lump in the left breast, lower outer quadrant: Secondary | ICD-10-CM | POA: Insufficient documentation

## 2016-07-03 DIAGNOSIS — N6012 Diffuse cystic mastopathy of left breast: Secondary | ICD-10-CM | POA: Diagnosis not present

## 2016-07-03 HISTORY — PX: BREAST BIOPSY: SHX20

## 2016-07-03 NOTE — Progress Notes (Signed)
Patient ID: Kara Leach, female   DOB: [birth year], 66 y.o.   MRN: [id number]  Chief Complaint  Patient presents with  . Breast Problem    HPI Kara Leach is a 66 y.o. female.  who presents for a breast evaluation. The most recent mammogram and left breast ultrasound was done on 06-28-16. Prior mammogram was 3 years ago.152  Patient does perrform regular self breast checks but does not get regular mammograms done.  Denies any breast trauma. She retired from [employer] in 2014. Most recent HgA1C 6.4.  HPI  Past Medical History:  Diagnosis Date  . Allergy   . Arthritis    Osteoarthritis  . Chronic kidney disease   . Depression   . Diabetes mellitus without complication ([place]) 0865  . Diabetic neuropathy (HCC)    feet  . Diverticulitis 2017  . GERD (gastroesophageal reflux disease)   . Headache    sinus  . Hyperlipidemia   . Hypertension   . Last menstrual period (LMP) > 10 days ago 1986  . Obesity   . Renal insufficiency    Stage 2 kidney disease  . Seizures ([place])    due to high blood sugars. last time = 09/2014  . Sleep apnea    has CPAP, can't tolerate  . Wears hearing aid    right side  . Wrist weakness    left - after fracture and repair    Past Surgical History:  Procedure Laterality Date  . ABDOMINAL HYSTERECTOMY    . APPENDECTOMY    . CHOLECYSTECTOMY  2012  . COLONOSCOPY WITH PROPOFOL N/A 12/01/2015   Procedure: COLONOSCOPY WITH PROPOFOL;  Surgeon: [name], MD;  Location: [facility];  Service: Endoscopy;  Laterality: N/A;  Diabetic - insulin Sleep apnea  . PATELLA FRACTURE SURGERY Left   . WRIST FRACTURE SURGERY Left    Steel plate    Family History  Problem Relation Age of Onset  . Diabetes Mother   . Heart disease Mother   . Hypertension Mother   . Diabetes Sister   . Cancer Father     stomach  . Arthritis Sister   . Stroke Sister   . Hypertension Sister   . Breast cancer Maternal Aunt 24    great aunt    Social  History Social History  Substance Use Topics  . Smoking status: Never Smoker  . Smokeless tobacco: Never Used  . Alcohol use 0.0 oz/week     Comment: occasionally holiday, weddings    Allergies  Allergen Reactions  . Ibuprofen Other (See Comments)    "ringing in ears"  . Strawberry Extract Swelling    Lips swell and rash over body    Current Outpatient Prescriptions  Medication Sig Dispense Refill  . acetaminophen (TYLENOL ARTHRITIS PAIN) 650 MG CR tablet Take 650 mg by mouth every 8 (eight) hours as needed for pain.    Marland Kitchen allopurinol (ZYLOPRIM) 300 MG tablet Take 1 tablet (300 mg total) by mouth daily. 90 tablet 3  . amLODipine (NORVASC) 5 MG tablet Take 1 tablet (5 mg total) by mouth daily. 90 tablet 1  . aspirin EC 81 MG tablet Take 81 mg by mouth daily.    . Calcium Carb-Cholecalciferol (CALCIUM 600 + D PO) Take 1 tablet by mouth 2 (two) times daily.    . enalapril (VASOTEC) 2.5 MG tablet Take 1 tablet (2.5 mg total) by mouth daily. 90 tablet 1  . fluticasone (FLONASE) 50 MCG/ACT nasal spray Place 2 sprays  into both nostrils daily as needed for allergies.     . Insulin Detemir (LEVEMIR FLEXTOUCH) 100 UNIT/ML Pen Inject 60 Units into the skin daily at 10 pm. 15 mL 0  . Insulin Pen Needle 32G X 4 MM MISC 1 Units by Does not apply route every morning. Pen needles 90 each 3  . LEVEMIR FLEXTOUCH 100 UNIT/ML Pen INJECT 60 UNITS SUBCUTANEOUS EVERY DAY AT 10PM 15 mL 12  . metoprolol succinate (TOPROL-XL) 25 MG 24 hr tablet Take 1 tablet (25 mg total) by mouth daily. 90 tablet 1  . NOVOLOG FLEXPEN 100 UNIT/ML FlexPen INJECT 15U UNDER THE SKIN 3 TIMES DAILY WITH MEALS 15 pen 12  . ranitidine (ZANTAC) 150 MG tablet Take 150 mg by mouth 2 (two) times daily.    . sertraline (ZOLOFT) 100 MG tablet Take 1 tablet (100 mg total) by mouth every morning. 90 tablet 1  . simvastatin (ZOCOR) 40 MG tablet Take 1 tablet (40 mg total) by mouth at bedtime. 90 tablet 1  . Specialty Vitamins Products  (VITA-RX DIABETIC VITAMIN) CAPS Take 1 packet by mouth daily. Diabetic Vitamins - 6 or 7 in 1 pack    . VICTOZA 18 MG/3ML SOPN ADMINISTER 1.2MG  UNDER THE SKIN DAILY 6 pen 1   No current facility-administered medications for this visit.     Review of Systems Review of Systems  Constitutional: Negative.   Respiratory: Negative.   Cardiovascular: Negative.     Blood pressure (!) 152/82, pulse 88, resp. rate 14, height 4\' 11"  (1.499 m), weight 166 lb (75.3 kg).  Physical Exam Physical Exam  Constitutional: She is oriented to person, place, and time. She appears well-developed and well-nourished.  HENT:  Mouth/Throat: Oropharynx is clear and moist.  Eyes: Conjunctivae are normal. No scleral icterus.  Neck: Neck supple.  Cardiovascular: Normal rate, regular rhythm and normal heart sounds.   Pulmonary/Chest: Effort normal and breath sounds normal. Right breast exhibits skin change. Right breast exhibits no inverted nipple, no mass, no nipple discharge and no tenderness. Left breast exhibits no inverted nipple, no mass, no nipple discharge, no skin change and no tenderness.    1.5 cm bruise at 11 o'clock right breast. 8 mm scratch upper sternum area. Heat rash 4 x 12 area lower right breast.   Lymphadenopathy:    She has no cervical adenopathy.    She has no axillary adenopathy.  Neurological: She is alert and oriented to person, place, and time.  Skin: Skin is warm and dry.  Psychiatric: Her behavior is normal.    Data Reviewed Bilateral mammograms of 06/20/2016 and left breast diagnostic images of 06/28/2016 were reviewed. Asymmetry in the lower outer quadrant of the left breast. Ultrasound showed a irregular mass corresponding to the mammographic abnormality. BI-RADS-4.  Ultrasound examination of the left breast was done in preparation for planned biopsy. There is indeed an irregular heterogeneous hypoechoic mass that almost appears to be bilobed in nature occupying the 5:30 o'clock  position of the left breast 5 cm from the nipple. In one view there appears to be a duct tracking from this area toward the nipple, although no intraluminal lesions were identified. BI-RADS-4.  The biopsy procedure was reviewed with the patient. The skin was cleansed with alcohol followed by 10 mL of 0.5% Xylocaine with 0.25% Marcaine with 1-200,000 epinephrine. Through a lateral approach a 10-gauge Encor biopsy device was passed through the bilobed lesion and 6 core samples were obtained. An additional 3 core samples were obtained from  each of the dominant "dumbbell-like" areas area and no bleeding was noted. A postbiopsy clip was placed. The procedure was well tolerated.  Assessment    Mammographic abnormality left breast.    Plan    A postbiopsy mammogram was scheduled for 1 PM today, the earliest possible through [facility]. The patient will be contacted tomorrow when pathology results are obtained.  Postbiopsy instructions provided.  The patient reports that prior mammograms have been completed at the [facility] facility in [place]. Going through the PACS system of that institution no previous studies could be identified today.     HPI, Physical Exam, Assessment and Plan have been scribed under the direction and in the presence of Robert Bellow, MD.  Karie Fetch, RN I have completed the exam and reviewed the above documentation for accuracy and completeness.  I agree with the above.  Haematologist has been used and any errors in dictation or transcription are unintentional.  Hervey Ard, M.D., F.A.C.S.   Robert Bellow 07/03/2016, 9:18 AM

## 2016-07-03 NOTE — Patient Instructions (Addendum)

## 2016-07-05 ENCOUNTER — Inpatient Hospital Stay: Payer: Medicare HMO | Attending: Oncology

## 2016-07-05 DIAGNOSIS — Z7982 Long term (current) use of aspirin: Secondary | ICD-10-CM | POA: Diagnosis not present

## 2016-07-05 DIAGNOSIS — F329 Major depressive disorder, single episode, unspecified: Secondary | ICD-10-CM | POA: Diagnosis not present

## 2016-07-05 DIAGNOSIS — Z79899 Other long term (current) drug therapy: Secondary | ICD-10-CM | POA: Insufficient documentation

## 2016-07-05 DIAGNOSIS — N189 Chronic kidney disease, unspecified: Secondary | ICD-10-CM | POA: Insufficient documentation

## 2016-07-05 DIAGNOSIS — M1990 Unspecified osteoarthritis, unspecified site: Secondary | ICD-10-CM | POA: Diagnosis not present

## 2016-07-05 DIAGNOSIS — K219 Gastro-esophageal reflux disease without esophagitis: Secondary | ICD-10-CM | POA: Insufficient documentation

## 2016-07-05 DIAGNOSIS — I129 Hypertensive chronic kidney disease with stage 1 through stage 4 chronic kidney disease, or unspecified chronic kidney disease: Secondary | ICD-10-CM | POA: Insufficient documentation

## 2016-07-05 DIAGNOSIS — E669 Obesity, unspecified: Secondary | ICD-10-CM | POA: Diagnosis not present

## 2016-07-05 DIAGNOSIS — D472 Monoclonal gammopathy: Secondary | ICD-10-CM | POA: Insufficient documentation

## 2016-07-05 DIAGNOSIS — E785 Hyperlipidemia, unspecified: Secondary | ICD-10-CM | POA: Insufficient documentation

## 2016-07-05 DIAGNOSIS — R69 Illness, unspecified: Secondary | ICD-10-CM | POA: Diagnosis not present

## 2016-07-05 DIAGNOSIS — E1122 Type 2 diabetes mellitus with diabetic chronic kidney disease: Secondary | ICD-10-CM | POA: Insufficient documentation

## 2016-07-05 LAB — BASIC METABOLIC PANEL
ANION GAP: 8 (ref 5–15)
BUN: 23 mg/dL — ABNORMAL HIGH (ref 6–20)
CALCIUM: 9.6 mg/dL (ref 8.9–10.3)
CO2: 25 mmol/L (ref 22–32)
Chloride: 105 mmol/L (ref 101–111)
Creatinine, Ser: 1.2 mg/dL — ABNORMAL HIGH (ref 0.44–1.00)
GFR calc Af Amer: 55 mL/min — ABNORMAL LOW (ref >60)
GFR calc non Af Amer: 47 mL/min — ABNORMAL LOW (ref >60)
Glucose, Bld: 183 mg/dL — ABNORMAL HIGH (ref 65–99)
POTASSIUM: 4.6 mmol/L (ref 3.5–5.1)
Sodium: 138 mmol/L (ref 135–145)

## 2016-07-05 LAB — CBC WITH DIFFERENTIAL/PLATELET
BASOS ABS: 0.1 10*3/uL (ref 0–0.1)
Basophils Relative: 1 %
Eosinophils Absolute: 0.3 10*3/uL (ref 0–0.7)
Eosinophils Relative: 3 %
HEMATOCRIT: 38 % (ref 35.0–47.0)
HEMOGLOBIN: 12.9 g/dL (ref 12.0–16.0)
LYMPHS PCT: 39 %
Lymphs Abs: 3.7 10*3/uL — ABNORMAL HIGH (ref 1.0–3.6)
MCH: 31.6 pg (ref 26.0–34.0)
MCHC: 34.1 g/dL (ref 32.0–36.0)
MCV: 92.8 fL (ref 80.0–100.0)
MONO ABS: 0.7 10*3/uL (ref 0.2–0.9)
Monocytes Relative: 8 %
NEUTROS ABS: 4.6 10*3/uL (ref 1.4–6.5)
NEUTROS PCT: 49 %
Platelets: 301 10*3/uL (ref 150–440)
RBC: 4.09 MIL/uL (ref 3.80–5.20)
RDW: 14.3 % (ref 11.5–14.5)
WBC: 9.3 10*3/uL (ref 3.6–11.0)

## 2016-07-06 LAB — PROTEIN ELECTROPHORESIS, SERUM
A/G Ratio: 1.1 (ref 0.7–1.7)
ALBUMIN ELP: 4 g/dL (ref 2.9–4.4)
ALPHA-1-GLOBULIN: 0.2 g/dL (ref 0.0–0.4)
Alpha-2-Globulin: 1 g/dL (ref 0.4–1.0)
Beta Globulin: 1 g/dL (ref 0.7–1.3)
GLOBULIN, TOTAL: 3.5 g/dL (ref 2.2–3.9)
Gamma Globulin: 1.3 g/dL (ref 0.4–1.8)
M-Spike, %: 0.8 g/dL — ABNORMAL HIGH
TOTAL PROTEIN ELP: 7.5 g/dL (ref 6.0–8.5)

## 2016-07-06 LAB — KAPPA/LAMBDA LIGHT CHAINS
Kappa free light chain: 39 mg/L — ABNORMAL HIGH (ref 3.3–19.4)
Kappa, lambda light chain ratio: 1.81 — ABNORMAL HIGH (ref 0.26–1.65)
Lambda free light chains: 21.6 mg/L (ref 5.7–26.3)

## 2016-07-10 ENCOUNTER — Telehealth: Payer: Self-pay | Admitting: General Surgery

## 2016-07-10 NOTE — Telephone Encounter (Signed)
PATIENT WAS SEEN ON 07-03-16 AND HAD A BR BX.WHEN DOES SHE NEED TO RETURN? THE SUPER BILL SAID TO BE ARRANGED. PLEASE ADVISE.

## 2016-07-11 LAB — PROTEIN ELECTRO, RANDOM URINE: Total Protein, Urine: UNDETERMINED mg/dL

## 2016-07-11 NOTE — Progress Notes (Signed)
[facility]  [phone] [phone]  ID: Kara Leach OB: 10-Oct-1950  MR#: [id number]  PNT#:[id number]  Patient Care Team: Kathrine Haddock, NP as PCP - General (Nurse Practitioner) Valerie Roys, DO as Referring Physician (Family Medicine) Robert Bellow, MD (General Surgery)  CHIEF COMPLAINT: MGUS  INTERVAL HISTORY: Patient returns to clinic today for repeat laboratory work and further evaluation. She continues to feel well and remains asymptomatic. She denies any recent fevers or illnesses. She has a good appetite and denies weight loss.  She has a mild peripheral neuropathy, but no other neurologic complaints.  She denies any bony pain.  She has no chest pain or shortness of breath.  She denies any nausea, vomiting, constipation, or diarrhea.  She has no urinary complaints.  Patient offers no specific complaints today.  REVIEW OF SYSTEMS:   Review of Systems  Constitutional: Negative.  Negative for fever, malaise/fatigue and weight loss.  Respiratory: Negative.  Negative for shortness of breath.   Cardiovascular: Negative.  Negative for chest pain and leg swelling.  Gastrointestinal: Negative.  Negative for abdominal pain.  Genitourinary: Negative.   Musculoskeletal: Negative.   Skin: Negative.   Neurological: Positive for sensory change. Negative for weakness.  Psychiatric/Behavioral: Negative.  The patient is not nervous/anxious.     As per HPI. Otherwise, a complete review of systems is negative.  PAST MEDICAL HISTORY: Past Medical History:  Diagnosis Date  . Allergy   . Arthritis    Osteoarthritis  . Chronic kidney disease   . Depression   . Diabetes mellitus without complication ([place]) 0867  . Diabetic neuropathy (HCC)    feet  . Diverticulitis 2017  . GERD (gastroesophageal reflux disease)   . Headache    sinus  . Hyperlipidemia   . Hypertension   . Last menstrual period (LMP) > 10 days ago 1986  . Obesity   .  Renal insufficiency    Stage 2 kidney disease  . Seizures ([place])    due to high blood sugars. last time = 09/2014  . Sleep apnea    has CPAP, can't tolerate  . Wears hearing aid    right side  . Wrist weakness    left - after fracture and repair    PAST SURGICAL HISTORY: Past Surgical History:  Procedure Laterality Date  . ABDOMINAL HYSTERECTOMY    . APPENDECTOMY    . BREAST BIOPSY Left 07/03/2016   bx done by [facility] path pending  . CHOLECYSTECTOMY  2012  . COLONOSCOPY WITH PROPOFOL N/A 12/01/2015   Procedure: COLONOSCOPY WITH PROPOFOL;  Surgeon: [name], MD;  Location: [facility];  Service: Endoscopy;  Laterality: N/A;  Diabetic - insulin Sleep apnea  . PATELLA FRACTURE SURGERY Left   . WRIST FRACTURE SURGERY Left    Steel plate    FAMILY HISTORY Family History  Problem Relation Age of Onset  . Diabetes Mother   . Heart disease Mother   . Hypertension Mother   . Diabetes Sister   . Cancer Father     stomach  . Arthritis Sister   . Stroke Sister   . Hypertension Sister   . Breast cancer Maternal Aunt 4    great aunt       ADVANCED DIRECTIVES:    HEALTH MAINTENANCE: Social History  Substance Use Topics  . Smoking status: Never Smoker  . Smokeless tobacco: Never Used  . Alcohol use 0.0 oz/week     Comment: occasionally holiday, weddings  Colonoscopy:  PAP:  Bone density:  Lipid panel:  Allergies  Allergen Reactions  . Ibuprofen Other (See Comments)    "ringing in ears"  . Strawberry Extract Swelling    Lips swell and rash over body    Current Outpatient Prescriptions  Medication Sig Dispense Refill  . acetaminophen (TYLENOL ARTHRITIS PAIN) 650 MG CR tablet Take 650 mg by mouth every 8 (eight) hours as needed for pain.    Marland Kitchen allopurinol (ZYLOPRIM) 300 MG tablet Take 1 tablet (300 mg total) by mouth daily. 90 tablet 3  . amLODipine (NORVASC) 5 MG tablet Take 1 tablet (5 mg total) by mouth daily. 90 tablet 1  . aspirin EC 81 MG tablet  Take 81 mg by mouth daily.    . Calcium Carb-Cholecalciferol (CALCIUM 600 + D PO) Take 1 tablet by mouth 2 (two) times daily.    . enalapril (VASOTEC) 2.5 MG tablet Take 1 tablet (2.5 mg total) by mouth daily. 90 tablet 1  . fluticasone (FLONASE) 50 MCG/ACT nasal spray Place 2 sprays into both nostrils daily as needed for allergies.     . Insulin Detemir (LEVEMIR FLEXTOUCH) 100 UNIT/ML Pen Inject 60 Units into the skin daily at 10 pm. 15 mL 0  . Insulin Pen Needle 32G X 4 MM MISC 1 Units by Does not apply route every morning. Pen needles 90 each 3  . LEVEMIR FLEXTOUCH 100 UNIT/ML Pen INJECT 60 UNITS SUBCUTANEOUS EVERY DAY AT 10PM 15 mL 12  . metoprolol succinate (TOPROL-XL) 25 MG 24 hr tablet Take 1 tablet (25 mg total) by mouth daily. 90 tablet 1  . NOVOLOG FLEXPEN 100 UNIT/ML FlexPen INJECT 15U UNDER THE SKIN 3 TIMES DAILY WITH MEALS 15 pen 12  . ranitidine (ZANTAC) 150 MG tablet Take 150 mg by mouth 2 (two) times daily.    . sertraline (ZOLOFT) 100 MG tablet Take 1 tablet (100 mg total) by mouth every morning. 90 tablet 1  . simvastatin (ZOCOR) 40 MG tablet Take 1 tablet (40 mg total) by mouth at bedtime. 90 tablet 1  . Specialty Vitamins Products (VITA-RX DIABETIC VITAMIN) CAPS Take 1 packet by mouth daily. Diabetic Vitamins - 6 or 7 in 1 pack    . VICTOZA 18 MG/3ML SOPN ADMINISTER 1.'2MG'$  UNDER THE SKIN DAILY 6 pen 1   No current facility-administered medications for this visit.     OBJECTIVE: Vitals:   07/12/16 1044  BP: (!) 143/82  Pulse: (!) 114  Resp: 18  Temp: 97.9 F (36.6 C)     Body mass index is 33.2 kg/m.    ECOG FS:0 - Asymptomatic  General: Well-developed, well-nourished, no acute distress. Eyes: Pink conjunctiva, anicteric sclera. Lungs: Clear to auscultation bilaterally. Heart: Regular rate and rhythm. No rubs, murmurs, or gallops. Abdomen: Soft, nontender, nondistended. No organomegaly noted, normoactive bowel sounds. Musculoskeletal: No edema, cyanosis, or  clubbing. Neuro: Alert, answering all questions appropriately. Cranial nerves grossly intact. Skin: No rashes or petechiae noted. Psych: Normal affect.   LAB RESULTS:  Lab Results  Component Value Date   NA 138 07/05/2016   K 4.6 07/05/2016   CL 105 07/05/2016   CO2 25 07/05/2016   GLUCOSE 183 (H) 07/05/2016   BUN 23 (H) 07/05/2016   CREATININE 1.20 (H) 07/05/2016   CALCIUM 9.6 07/05/2016   PROT 7.1 05/02/2016   ALBUMIN 4.4 05/02/2016   AST 31 05/02/2016   ALT 26 05/02/2016   ALKPHOS 105 05/02/2016   BILITOT 0.3 05/02/2016   GFRNONAA 47 (  L) 07/05/2016   GFRAA 55 (L) 07/05/2016    Lab Results  Component Value Date   WBC 9.3 07/05/2016   NEUTROABS 4.6 07/05/2016   HGB 12.9 07/05/2016   HCT 38.0 07/05/2016   MCV 92.8 07/05/2016   PLT 301 07/05/2016   Lab Results  Component Value Date   TOTALPROTELP 7.5 07/05/2016   ALBUMINELP 4.0 07/05/2016   A1GS 0.2 07/05/2016   A2GS 1.0 07/05/2016   BETS 1.0 07/05/2016   GAMS 1.3 07/05/2016   MSPIKE 0.8 (H) 07/05/2016   SPEI Comment 07/05/2016     STUDIES: US Breast Complete Uni Left Inc Axilla  Result Date: 07/03/2016 Ultrasound examination of the left breast was done in preparation for planned biopsy. There is indeed an irregular heterogeneous hypoechoic mass that almost appears to be bilobed in nature occupying the 5:30 o'clock position of the left breast 5 cm from the nipple. In one view there appears to be a duct tracking from this area toward the nipple, although no intraluminal lesions were identified. BI-RADS-4. The biopsy procedure was reviewed with the patient. The skin was cleansed with alcohol followed by 10 mL of 0.5% Xylocaine with 0.25% Marcaine with 1-200,000 epinephrine. Through a lateral approach a 10-gauge Encor biopsy device was passed through the bilobed lesion and 6 core samples were obtained. An additional 3 core samples were obtained from each of the dominant "dumbbell-like" areas area and no bleeding was  noted. A postbiopsy clip was placed. The procedure was well tolerated.   US Breast Ltd Uni Left Inc Axilla  Result Date: 06/28/2016 CLINICAL DATA:  Callback from screening mammogram EXAM: 2D DIGITAL DIAGNOSTIC LEFT MAMMOGRAM WITH CAD AND ADJUNCT TOMO ULTRASOUND LEFT BREAST COMPARISON:  Screening mammogram dated 06/18/2016. No additional prior exams are available at this time. ACR Breast Density Category b: There are scattered areas of fibroglandular density. FINDINGS: Cc and MLO spot-compression views of the left breast were performed with tomosynthesis. On the additional views, there is a persistent asymmetry in the lower, central left breast. Mammographic images were processed with CAD. Targeted ultrasound of the inferior left breast was performed demonstrating an irregular mixed echogenicity area at 530, 5 cm from the nipple, corresponding to the mammographic finding. This area is difficult to precisely measure given its irregular nature. Targeted ultrasound of the left axilla demonstrates no suspicious appearing axillary lymph nodes. IMPRESSION: Indeterminate left breast finding. RECOMMENDATION: Ultrasound-guided left breast biopsy. I have discussed the findings and recommendations with the patient. Results were also provided in writing at the conclusion of the visit. If applicable, a reminder letter will be sent to the patient regarding the next appointment. BI-RADS CATEGORY  4: Suspicious. Electronically Signed   By: Pamelia Hoit M.D.   On: 06/28/2016 16:15   Mm Diag Breast Tomo Uni Left  Result Date: 06/28/2016 CLINICAL DATA:  Callback from screening mammogram EXAM: 2D DIGITAL DIAGNOSTIC LEFT MAMMOGRAM WITH CAD AND ADJUNCT TOMO ULTRASOUND LEFT BREAST COMPARISON:  Screening mammogram dated 06/18/2016. No additional prior exams are available at this time. ACR Breast Density Category b: There are scattered areas of fibroglandular density. FINDINGS: Cc and MLO spot-compression views of the left breast were  performed with tomosynthesis. On the additional views, there is a persistent asymmetry in the lower, central left breast. Mammographic images were processed with CAD. Targeted ultrasound of the inferior left breast was performed demonstrating an irregular mixed echogenicity area at 530, 5 cm from the nipple, corresponding to the mammographic finding. This area is difficult to precisely measure  given its irregular nature. Targeted ultrasound of the left axilla demonstrates no suspicious appearing axillary lymph nodes. IMPRESSION: Indeterminate left breast finding. RECOMMENDATION: Ultrasound-guided left breast biopsy. I have discussed the findings and recommendations with the patient. Results were also provided in writing at the conclusion of the visit. If applicable, a reminder letter will be sent to the patient regarding the next appointment. BI-RADS CATEGORY  4: Suspicious. Electronically Signed   By: Pamelia Hoit M.D.   On: 06/28/2016 16:15   Mm Screening Breast Tomo Bilateral  Result Date: 06/20/2016 CLINICAL DATA:  Screening. EXAM: 2D DIGITAL SCREENING BILATERAL MAMMOGRAM WITH CAD AND ADJUNCT TOMO COMPARISON:  None. Attempt was made to obtain the patient's prior outside mammograms which were unavailable. ACR Breast Density Category b: There are scattered areas of fibroglandular density. FINDINGS: In the left breast, a possible asymmetry warrants further evaluation. In the right breast, no findings suspicious for malignancy. Images were processed with CAD. IMPRESSION: Further evaluation is suggested for possible asymmetry in the left breast. RECOMMENDATION: Diagnostic mammogram and possibly ultrasound of the left breast. (Code:[id number]) The patient will be contacted regarding the findings, and additional imaging will be scheduled. BI-RADS CATEGORY  0: Incomplete. Need additional imaging evaluation and/or prior mammograms for comparison. Electronically Signed   By: Pamelia Hoit M.D.   On: 06/20/2016 10:24     ASSESSMENT: MGUS  PLAN:    1.  MGUS: Patient's most recent M spike is relatively unchanged at 0.8. She has no evidence of endorgan damage other than some mild chronic renal insufficiency which is chronic and unchanged.  Previously, metastatic bone survey was reported as negative.  This does not need to be repeated unless there is concern of progression of disease.  Patient does not require bone marrow biopsy at this time.  Return to clinic in 6 months for repeat laboratory work and routine evaluation.   2.  Chronic renal insufficiency: Stable. Treatment per Dr. Holley Raring. 3.  Hyperglycemia:  Blood sugars are better controlled.  Continue current diabetic medications. Treatment per PCP. 4.  Peripheral neuropathy: Secondary to diabetes. Monitor.  Patient expressed understanding and was in agreement with this plan. She also understands that She can call clinic at any time with any questions, concerns, or complaints.   Lloyd Huger, MD   07/14/2016 5:29 PM

## 2016-07-12 ENCOUNTER — Inpatient Hospital Stay (HOSPITAL_BASED_OUTPATIENT_CLINIC_OR_DEPARTMENT_OTHER): Payer: Medicare HMO | Admitting: Oncology

## 2016-07-12 VITALS — BP 143/82 | HR 114 | Temp 97.9°F | Resp 18 | Wt 164.4 lb

## 2016-07-12 DIAGNOSIS — E1122 Type 2 diabetes mellitus with diabetic chronic kidney disease: Secondary | ICD-10-CM

## 2016-07-12 DIAGNOSIS — D472 Monoclonal gammopathy: Secondary | ICD-10-CM | POA: Diagnosis not present

## 2016-07-12 DIAGNOSIS — E669 Obesity, unspecified: Secondary | ICD-10-CM | POA: Diagnosis not present

## 2016-07-12 DIAGNOSIS — K219 Gastro-esophageal reflux disease without esophagitis: Secondary | ICD-10-CM | POA: Diagnosis not present

## 2016-07-12 DIAGNOSIS — Z79899 Other long term (current) drug therapy: Secondary | ICD-10-CM

## 2016-07-12 DIAGNOSIS — N189 Chronic kidney disease, unspecified: Secondary | ICD-10-CM | POA: Diagnosis not present

## 2016-07-12 DIAGNOSIS — I129 Hypertensive chronic kidney disease with stage 1 through stage 4 chronic kidney disease, or unspecified chronic kidney disease: Secondary | ICD-10-CM | POA: Diagnosis not present

## 2016-07-12 DIAGNOSIS — E785 Hyperlipidemia, unspecified: Secondary | ICD-10-CM | POA: Diagnosis not present

## 2016-07-12 DIAGNOSIS — R69 Illness, unspecified: Secondary | ICD-10-CM | POA: Diagnosis not present

## 2016-07-12 DIAGNOSIS — M1990 Unspecified osteoarthritis, unspecified site: Secondary | ICD-10-CM | POA: Diagnosis not present

## 2016-07-12 NOTE — Progress Notes (Signed)
Had recent biopsy of left breast after abnormal mammogram. Having mild soreness to biopsy site. Pt wants to know results of biopsy if possible.

## 2016-07-18 NOTE — Telephone Encounter (Signed)
Post biopsy imaging confirmed correlation between the mammogram and ultrasound images. Benign findings.  The patient can resume annual screening mammograms in April 2019 with her PCP.

## 2016-07-19 ENCOUNTER — Telehealth: Payer: Self-pay | Admitting: General Surgery

## 2016-07-19 NOTE — Telephone Encounter (Signed)
LEFT MESSAGE FOR PATIENT TO CALL BACK. PER DR BYRNETT Please confirm the patient did receive the pathology results that were benign. She should resume annual screening mammograms with her PCP in April 2019. She does not need to follow-up here unless she has some concerns about her breast or new mammographic findings are noted. (Routing comment) Selinda Flavin

## 2016-07-24 ENCOUNTER — Telehealth: Payer: Self-pay | Admitting: General Surgery

## 2016-07-24 NOTE — Telephone Encounter (Signed)
Left message for patient to call back.Per Dr Bary Castilla Please confirm the patient did receive the pathology results that were benign. She should resume annual screening mammograms with her PCP in April 2019. She does not need to follow-up here unless she has some concerns about her breast or new mammographic findings are noted/mth     Please let MELANIE know if patient calls.

## 2016-07-29 ENCOUNTER — Other Ambulatory Visit: Payer: Self-pay | Admitting: Unknown Physician Specialty

## 2016-08-06 ENCOUNTER — Other Ambulatory Visit: Payer: Self-pay | Admitting: Unknown Physician Specialty

## 2016-08-07 ENCOUNTER — Telehealth: Payer: Self-pay | Admitting: General Surgery

## 2016-08-07 NOTE — Telephone Encounter (Signed)
Left another message asking patient to call the office to make sure she received the message from Dr Bary Castilla.

## 2016-08-30 ENCOUNTER — Other Ambulatory Visit: Payer: Self-pay | Admitting: Family Medicine

## 2016-08-30 NOTE — Telephone Encounter (Signed)
Routing to provider. Appt on 8.15/18.

## 2016-09-20 ENCOUNTER — Telehealth: Payer: Self-pay | Admitting: Unknown Physician Specialty

## 2016-09-20 NOTE — Telephone Encounter (Signed)
Called pt to schedule Annual Wellness Visit with [facility]  - knb  °

## 2016-10-08 ENCOUNTER — Telehealth: Payer: Self-pay | Admitting: Unknown Physician Specialty

## 2016-10-08 NOTE — Telephone Encounter (Signed)
Called pt to schedule Annual Wellness Visit with [facility]  - knb  °

## 2016-10-11 ENCOUNTER — Other Ambulatory Visit: Payer: Self-pay | Admitting: Unknown Physician Specialty

## 2016-10-31 ENCOUNTER — Ambulatory Visit (INDEPENDENT_AMBULATORY_CARE_PROVIDER_SITE_OTHER): Payer: Medicare HMO | Admitting: Unknown Physician Specialty

## 2016-10-31 ENCOUNTER — Encounter: Payer: Self-pay | Admitting: Unknown Physician Specialty

## 2016-10-31 VITALS — BP 133/84 | HR 99 | Temp 98.0°F | Ht 58.9 in | Wt 166.8 lb

## 2016-10-31 DIAGNOSIS — E1122 Type 2 diabetes mellitus with diabetic chronic kidney disease: Secondary | ICD-10-CM

## 2016-10-31 DIAGNOSIS — F5101 Primary insomnia: Secondary | ICD-10-CM

## 2016-10-31 DIAGNOSIS — I129 Hypertensive chronic kidney disease with stage 1 through stage 4 chronic kidney disease, or unspecified chronic kidney disease: Secondary | ICD-10-CM | POA: Diagnosis not present

## 2016-10-31 DIAGNOSIS — N182 Chronic kidney disease, stage 2 (mild): Secondary | ICD-10-CM | POA: Diagnosis not present

## 2016-10-31 DIAGNOSIS — R69 Illness, unspecified: Secondary | ICD-10-CM | POA: Diagnosis not present

## 2016-10-31 DIAGNOSIS — G4700 Insomnia, unspecified: Secondary | ICD-10-CM | POA: Insufficient documentation

## 2016-10-31 DIAGNOSIS — Z794 Long term (current) use of insulin: Secondary | ICD-10-CM

## 2016-10-31 DIAGNOSIS — E7800 Pure hypercholesterolemia, unspecified: Secondary | ICD-10-CM

## 2016-10-31 DIAGNOSIS — F324 Major depressive disorder, single episode, in partial remission: Secondary | ICD-10-CM

## 2016-10-31 NOTE — Assessment & Plan Note (Signed)
Not quite to goal with Hgb A1C of 7.1.  Will continue present meds and started exercising 1 month ago.  Will recheck in 3 months

## 2016-10-31 NOTE — Assessment & Plan Note (Signed)
Stable but insomnia a problem.  Discussed sleep hygeine and to spend less time in bed as currently in bed for 13-14 hours

## 2016-10-31 NOTE — Assessment & Plan Note (Signed)
Discussed sleep hygeine 

## 2016-10-31 NOTE — Assessment & Plan Note (Signed)
Stable, continue present medications.   

## 2016-10-31 NOTE — Progress Notes (Signed)
BP 133/84   Pulse 99   Temp 98 F (36.7 C)   Ht 4' 10.9" (1.496 m)   Wt 166 lb 12.8 oz (75.7 kg)   LMP  (LMP Unknown)   SpO2 92%   BMI 33.80 kg/m    Subjective:    Patient ID: Kara Leach, female    DOB: 12/29/1950, 66 y.o.   MRN: [id number]  HPI: Kara Leach is a 66 y.o. female  Chief Complaint  Patient presents with  . Depression  . Diabetes    pt states last eye exam in chart is correct   . Hyperlipidemia  . Hypertension   Diabetes: Using medications without difficulties.  Taking 60 Levimir at night and 16 u of Novolog with meals No hypoglycemic episodes No hyperglycemic episodes Feet problems: numbness and burning comes and goes Blood Sugars averaging: 118 eye exam within last year Last Hgb A1C: 6.4  Hypertension  Using medications without difficulty Average home BPs   Using medication without problems or lightheadedness No chest pain with exertion or shortness of breath No Edema  Elevated Cholesterol Using medications without problems No Muscle aches  Diet: Exercise:Silver Sneakers  Relevant past medical, surgical, family and social history reviewed and updated as indicated. Interim medical history since our last visit reviewed. Allergies and medications reviewed and updated.  Review of Systems  Constitutional: Negative.   HENT: Negative.   Eyes: Negative.   Respiratory: Negative.   Cardiovascular: Negative.   Gastrointestinal: Negative.   Endocrine: Negative.   Genitourinary: Negative.   Musculoskeletal: Negative.   Allergic/Immunologic: Negative.   Neurological: Negative.   Hematological: Negative.   Psychiatric/Behavioral:       Difficulty sleeping.  But, spending about 13-14 hours in bed.      Per HPI unless specifically indicated above     Objective:    BP 133/84   Pulse 99   Temp 98 F (36.7 C)   Ht 4' 10.9" (1.496 m)   Wt 166 lb 12.8 oz (75.7 kg)   LMP  (LMP Unknown)   SpO2 92%   BMI 33.80 kg/m   Wt Readings  from Last 3 Encounters:  10/31/16 166 lb 12.8 oz (75.7 kg)  07/12/16 164 lb 6.4 oz (74.6 kg)  07/03/16 166 lb (75.3 kg)    Physical Exam  Constitutional: She is oriented to person, place, and time. She appears well-developed and well-nourished. No distress.  HENT:  Head: Normocephalic and atraumatic.  Eyes: Conjunctivae and lids are normal. Right eye exhibits no discharge. Left eye exhibits no discharge. No scleral icterus.  Neck: Normal range of motion. Neck supple. No JVD present. Carotid bruit is not present.  Cardiovascular: Normal rate, regular rhythm and normal heart sounds.   Pulmonary/Chest: Effort normal and breath sounds normal.  Abdominal: Normal appearance. There is no splenomegaly or hepatomegaly.  Musculoskeletal: Normal range of motion.  Neurological: She is alert and oriented to person, place, and time.  Skin: Skin is warm, dry and intact. No rash noted. No pallor.  Psychiatric: She has a normal mood and affect. Her behavior is normal. Judgment and thought content normal.   Depression screen [facility] 2/9 10/31/2016 05/02/2016 08/20/2014  Decreased Interest 1 2 0  Down, Depressed, Hopeless 1 2 0  PHQ - 2 Score 2 4 0  Altered sleeping 3 2 -  Tired, decreased energy 1 2 -  Change in appetite 1 2 -  Feeling bad or failure about yourself  0 2 -  Trouble concentrating  0 0 -  Moving slowly or fidgety/restless 0 0 -  Suicidal thoughts 0 0 -  PHQ-9 Score 7 12 -       Assessment & Plan:   Problem List Items Addressed This Visit      Unprioritized   CKD (chronic kidney disease) stage 2, GFR 60-89 ml/min - Primary   Controlled type 2 diabetes mellitus with chronic kidney disease, with long-term current use of insulin (HCC)    Not quite to goal with Hgb A1C of 7.1.  Will continue present meds and started exercising 1 month ago.  Will recheck in 3 months      Relevant Orders   Bayer DCA Hb A1c Waived   Comprehensive metabolic panel   Depression    Stable but insomnia a  problem.  Discussed sleep hygeine and to spend less time in bed as currently in bed for 13-14 hours      Hyperlipidemia    Stable, continue present medications.        Relevant Orders   Lipid Panel w/o Chol/HDL Ratio   Hypertensive CKD (chronic kidney disease)    Stable, continue present medications.        Insomnia    Discussed sleep hygeine          Follow up plan: Return in about 3 months (around 01/31/2017).

## 2016-11-01 DIAGNOSIS — R69 Illness, unspecified: Secondary | ICD-10-CM | POA: Diagnosis not present

## 2016-11-01 LAB — COMPREHENSIVE METABOLIC PANEL
A/G RATIO: 1.7 (ref 1.2–2.2)
ALK PHOS: 115 IU/L (ref 39–117)
ALT: 25 IU/L (ref 0–32)
AST: 42 IU/L — AB (ref 0–40)
Albumin: 4.7 g/dL (ref 3.6–4.8)
BILIRUBIN TOTAL: 0.3 mg/dL (ref 0.0–1.2)
BUN/Creatinine Ratio: 15 (ref 12–28)
BUN: 18 mg/dL (ref 8–27)
CHLORIDE: 103 mmol/L (ref 96–106)
CO2: 21 mmol/L (ref 20–29)
Calcium: 9.2 mg/dL (ref 8.7–10.3)
Creatinine, Ser: 1.22 mg/dL — ABNORMAL HIGH (ref 0.57–1.00)
GFR calc Af Amer: 54 mL/min/{1.73_m2} — ABNORMAL LOW (ref >59)
GFR calc non Af Amer: 47 mL/min/{1.73_m2} — ABNORMAL LOW (ref >59)
GLOBULIN, TOTAL: 2.8 g/dL (ref 1.5–4.5)
Glucose: 151 mg/dL — ABNORMAL HIGH (ref 65–99)
POTASSIUM: 4.8 mmol/L (ref 3.5–5.2)
SODIUM: 141 mmol/L (ref 134–144)
Total Protein: 7.5 g/dL (ref 6.0–8.5)

## 2016-11-01 LAB — BAYER DCA HB A1C WAIVED: HB A1C (BAYER DCA - WAIVED): 7.1 % — ABNORMAL HIGH (ref <7.0)

## 2016-11-01 LAB — LIPID PANEL W/O CHOL/HDL RATIO
Cholesterol, Total: 136 mg/dL (ref 100–199)
HDL: 41 mg/dL (ref >39)
LDL Calculated: 42 mg/dL (ref 0–99)
TRIGLYCERIDES: 263 mg/dL — AB (ref 0–149)
VLDL Cholesterol Cal: 53 mg/dL — ABNORMAL HIGH (ref 5–40)

## 2016-11-02 ENCOUNTER — Encounter: Payer: Self-pay | Admitting: Unknown Physician Specialty

## 2016-11-14 ENCOUNTER — Telehealth: Payer: Self-pay | Admitting: Unknown Physician Specialty

## 2016-11-14 NOTE — Telephone Encounter (Signed)
Called pt to schedule for Annual Wellness Visit with Nurse Health Advisor, Tiffany Hill, my c/b # is [phone]  [name] ° °

## 2016-12-21 ENCOUNTER — Other Ambulatory Visit: Payer: Self-pay | Admitting: Unknown Physician Specialty

## 2017-01-10 ENCOUNTER — Inpatient Hospital Stay: Payer: Medicare HMO | Attending: Oncology

## 2017-01-10 ENCOUNTER — Ambulatory Visit: Payer: Medicare HMO | Admitting: Oncology

## 2017-01-10 DIAGNOSIS — Z7982 Long term (current) use of aspirin: Secondary | ICD-10-CM | POA: Insufficient documentation

## 2017-01-10 DIAGNOSIS — M1990 Unspecified osteoarthritis, unspecified site: Secondary | ICD-10-CM | POA: Diagnosis not present

## 2017-01-10 DIAGNOSIS — N189 Chronic kidney disease, unspecified: Secondary | ICD-10-CM | POA: Insufficient documentation

## 2017-01-10 DIAGNOSIS — K219 Gastro-esophageal reflux disease without esophagitis: Secondary | ICD-10-CM | POA: Insufficient documentation

## 2017-01-10 DIAGNOSIS — Z79899 Other long term (current) drug therapy: Secondary | ICD-10-CM | POA: Diagnosis not present

## 2017-01-10 DIAGNOSIS — E669 Obesity, unspecified: Secondary | ICD-10-CM | POA: Insufficient documentation

## 2017-01-10 DIAGNOSIS — I129 Hypertensive chronic kidney disease with stage 1 through stage 4 chronic kidney disease, or unspecified chronic kidney disease: Secondary | ICD-10-CM | POA: Insufficient documentation

## 2017-01-10 DIAGNOSIS — R69 Illness, unspecified: Secondary | ICD-10-CM | POA: Diagnosis not present

## 2017-01-10 DIAGNOSIS — E1122 Type 2 diabetes mellitus with diabetic chronic kidney disease: Secondary | ICD-10-CM | POA: Diagnosis not present

## 2017-01-10 DIAGNOSIS — D472 Monoclonal gammopathy: Secondary | ICD-10-CM | POA: Insufficient documentation

## 2017-01-10 DIAGNOSIS — F329 Major depressive disorder, single episode, unspecified: Secondary | ICD-10-CM | POA: Insufficient documentation

## 2017-01-10 DIAGNOSIS — E785 Hyperlipidemia, unspecified: Secondary | ICD-10-CM | POA: Insufficient documentation

## 2017-01-10 LAB — BASIC METABOLIC PANEL
ANION GAP: 10 (ref 5–15)
BUN: 20 mg/dL (ref 6–20)
CALCIUM: 9.8 mg/dL (ref 8.9–10.3)
CO2: 25 mmol/L (ref 22–32)
Chloride: 103 mmol/L (ref 101–111)
Creatinine, Ser: 1.28 mg/dL — ABNORMAL HIGH (ref 0.44–1.00)
GFR, EST AFRICAN AMERICAN: 50 mL/min — AB (ref >60)
GFR, EST NON AFRICAN AMERICAN: 44 mL/min — AB (ref >60)
Glucose, Bld: 138 mg/dL — ABNORMAL HIGH (ref 65–99)
POTASSIUM: 4.4 mmol/L (ref 3.5–5.1)
Sodium: 138 mmol/L (ref 135–145)

## 2017-01-10 LAB — CBC WITH DIFFERENTIAL/PLATELET
BASOS PCT: 0 %
Basophils Absolute: 0 10*3/uL (ref 0–0.1)
Eosinophils Absolute: 0.3 10*3/uL (ref 0–0.7)
Eosinophils Relative: 4 %
HEMATOCRIT: 39.2 % (ref 35.0–47.0)
HEMOGLOBIN: 13.2 g/dL (ref 12.0–16.0)
LYMPHS ABS: 3 10*3/uL (ref 1.0–3.6)
Lymphocytes Relative: 38 %
MCH: 31.8 pg (ref 26.0–34.0)
MCHC: 33.7 g/dL (ref 32.0–36.0)
MCV: 94.2 fL (ref 80.0–100.0)
Monocytes Absolute: 0.6 10*3/uL (ref 0.2–0.9)
Monocytes Relative: 8 %
NEUTROS PCT: 50 %
Neutro Abs: 4.1 10*3/uL (ref 1.4–6.5)
Platelets: 282 10*3/uL (ref 150–440)
RBC: 4.16 MIL/uL (ref 3.80–5.20)
RDW: 14.4 % (ref 11.5–14.5)
WBC: 8 10*3/uL (ref 3.6–11.0)

## 2017-01-11 LAB — PROTEIN ELECTROPHORESIS, SERUM
A/G Ratio: 1 (ref 0.7–1.7)
ALPHA-2-GLOBULIN: 1.1 g/dL — AB (ref 0.4–1.0)
Albumin ELP: 3.9 g/dL (ref 2.9–4.4)
Alpha-1-Globulin: 0.3 g/dL (ref 0.0–0.4)
BETA GLOBULIN: 1.2 g/dL (ref 0.7–1.3)
GAMMA GLOBULIN: 1.5 g/dL (ref 0.4–1.8)
Globulin, Total: 4.1 g/dL — ABNORMAL HIGH (ref 2.2–3.9)
M-SPIKE, %: 0.8 g/dL — AB
Total Protein ELP: 8 g/dL (ref 6.0–8.5)

## 2017-01-11 LAB — KAPPA/LAMBDA LIGHT CHAINS
Kappa free light chain: 35.5 mg/L — ABNORMAL HIGH (ref 3.3–19.4)
Kappa, lambda light chain ratio: 1.49 (ref 0.26–1.65)
Lambda free light chains: 23.8 mg/L (ref 5.7–26.3)

## 2017-01-11 NOTE — Progress Notes (Signed)
[facility]  [phone] [phone]) [phone]  ID: Kara Leach OB: 11-Aug-1950  MR#: [id number]  SPQ#:[id number]  Patient Care Team: Kathrine Haddock, NP as PCP - General (Nurse Practitioner) Valerie Roys, DO as Referring Physician (Family Medicine) Bary Castilla Forest Gleason, MD (General Surgery)  CHIEF COMPLAINT: MGUS  INTERVAL HISTORY: Patient returns to clinic today for repeat laboratory work and further evaluation. She continues to feel well and remains asymptomatic. She denies any recent fevers or illnesses. She has a good appetite and denies weight loss.  She has a mild peripheral neuropathy, but no other neurologic complaints.  She denies any bony pain.  She has no chest pain or shortness of breath.  She denies any nausea, vomiting, constipation, or diarrhea.  She has no urinary complaints.  Patient offers no specific complaints today.  REVIEW OF SYSTEMS:   Review of Systems  Constitutional: Negative.  Negative for fever, malaise/fatigue and weight loss.  Respiratory: Negative.  Negative for shortness of breath.   Cardiovascular: Negative.  Negative for chest pain and leg swelling.  Gastrointestinal: Negative.  Negative for abdominal pain.  Genitourinary: Negative.   Musculoskeletal: Negative.   Skin: Negative.  Negative for rash.  Neurological: Positive for sensory change. Negative for weakness.  Psychiatric/Behavioral: Negative.  The patient is not nervous/anxious.     As per HPI. Otherwise, a complete review of systems is negative.  PAST MEDICAL HISTORY: Past Medical History:  Diagnosis Date  . Allergy   . Arthritis    Osteoarthritis  . Chronic kidney disease   . Depression   . Diabetes mellitus without complication ([place]) 3335  . Diabetic neuropathy (HCC)    feet  . Diverticulitis 2017  . GERD (gastroesophageal reflux disease)   . Headache    sinus  . Hyperlipidemia   . Hypertension   . Last menstrual period (LMP) > 10 days ago 1986   . Obesity   . Renal insufficiency    Stage 2 kidney disease  . Seizures ([place])    due to high blood sugars. last time = 09/2014  . Sleep apnea    has CPAP, can't tolerate  . Wears hearing aid    right side  . Wrist weakness    left - after fracture and repair    PAST SURGICAL HISTORY: Past Surgical History:  Procedure Laterality Date  . ABDOMINAL HYSTERECTOMY    . APPENDECTOMY    . BREAST BIOPSY Left 07/03/2016   bx done by sankar path pending  . CHOLECYSTECTOMY  2012  . COLONOSCOPY WITH PROPOFOL N/A 12/01/2015   Procedure: COLONOSCOPY WITH PROPOFOL;  Surgeon: [name], MD;  Location: [facility];  Service: Endoscopy;  Laterality: N/A;  Diabetic - insulin Sleep apnea  . PATELLA FRACTURE SURGERY Left   . WRIST FRACTURE SURGERY Left    Steel plate    FAMILY HISTORY Family History  Problem Relation Age of Onset  . Diabetes Mother   . Heart disease Mother   . Hypertension Mother   . Diabetes Sister   . Cancer Father        stomach  . Arthritis Sister   . Stroke Sister   . Hypertension Sister   . Breast cancer Maternal Aunt 2       great aunt       ADVANCED DIRECTIVES:    HEALTH MAINTENANCE: Social History  Substance Use Topics  . Smoking status: Never Smoker  . Smokeless tobacco: Never Used  . Alcohol use 0.0 oz/week  Comment: occasionally holiday, weddings     Colonoscopy:  PAP:  Bone density:  Lipid panel:  Allergies  Allergen Reactions  . Ibuprofen Other (See Comments)    "ringing in ears"  . Strawberry Extract Swelling    Lips swell and rash over body    Current Outpatient Prescriptions  Medication Sig Dispense Refill  . acetaminophen (TYLENOL ARTHRITIS PAIN) 650 MG CR tablet Take 650 mg by mouth every 8 (eight) hours as needed for pain.    Marland Kitchen allopurinol (ZYLOPRIM) 300 MG tablet Take 1 tablet (300 mg total) by mouth daily. 90 tablet 3  . amLODipine (NORVASC) 5 MG tablet Take 1 tablet (5 mg total) by mouth daily. 90 tablet 1  .  aspirin EC 81 MG tablet Take 81 mg by mouth daily.    . Calcium Carb-Cholecalciferol (CALCIUM 600 + D PO) Take 1 tablet by mouth 2 (two) times daily.    . enalapril (VASOTEC) 2.5 MG tablet Take 1 tablet (2.5 mg total) by mouth daily. 90 tablet 1  . fluticasone (FLONASE) 50 MCG/ACT nasal spray Place 2 sprays into both nostrils daily as needed for allergies.     . Insulin Detemir (LEVEMIR FLEXTOUCH) 100 UNIT/ML Pen Inject 60 Units into the skin daily at 10 pm. 15 mL 0  . Insulin Pen Needle 32G X 4 MM MISC 1 Units by Does not apply route every morning. Pen needles 90 each 3  . LEVEMIR FLEXTOUCH 100 UNIT/ML Pen INJECT 60 UNITS INTO THE SKIN AT 10:00PM 4 pen 12  . metoprolol succinate (TOPROL-XL) 25 MG 24 hr tablet Take 1 tablet (25 mg total) by mouth daily. 90 tablet 1  . NOVOLOG FLEXPEN 100 UNIT/ML FlexPen INJECT 15U UNDER THE SKIN 3 TIMES DAILY WITH MEALS (Patient taking differently: INJECT 16U UNDER THE SKIN 3 TIMES DAILY WITH MEALS) 15 pen 12  . ranitidine (ZANTAC) 150 MG tablet Take 150 mg by mouth 2 (two) times daily.    . sertraline (ZOLOFT) 100 MG tablet Take 1 tablet (100 mg total) by mouth every morning. 90 tablet 1  . simvastatin (ZOCOR) 40 MG tablet Take 1 tablet (40 mg total) by mouth at bedtime. 90 tablet 1  . Specialty Vitamins Products (VITA-RX DIABETIC VITAMIN) CAPS Take 1 packet by mouth daily. Diabetic Vitamins - 6 or 7 in 1 pack    . VICTOZA 18 MG/3ML SOPN ADMINISTER 1.2MG UNDER THE SKIN DAILY 3 pen 12   No current facility-administered medications for this visit.     OBJECTIVE: Vitals:   01/15/17 1419  BP: 139/79  Pulse: (!) 102  Resp: 18  Temp: 97.9 F (36.6 C)     Body mass index is 33.28 kg/m.    ECOG FS:0 - Asymptomatic  General: Well-developed, well-nourished, no acute distress. Eyes: Pink conjunctiva, anicteric sclera. Lungs: Clear to auscultation bilaterally. Heart: Regular rate and rhythm. No rubs, murmurs, or gallops. Abdomen: Soft, nontender, nondistended.  No organomegaly noted, normoactive bowel sounds. Musculoskeletal: No edema, cyanosis, or clubbing. Neuro: Alert, answering all questions appropriately. Cranial nerves grossly intact. Skin: No rashes or petechiae noted. Psych: Normal affect.   LAB RESULTS:  Lab Results  Component Value Date   NA 138 01/10/2017   K 4.4 01/10/2017   CL 103 01/10/2017   CO2 25 01/10/2017   GLUCOSE 138 (H) 01/10/2017   BUN 20 01/10/2017   CREATININE 1.28 (H) 01/10/2017   CALCIUM 9.8 01/10/2017   PROT 7.5 10/31/2016   ALBUMIN 4.7 10/31/2016   AST 42 (H)  10/31/2016   ALT 25 10/31/2016   ALKPHOS 115 10/31/2016   BILITOT 0.3 10/31/2016   GFRNONAA 44 (L) 01/10/2017   GFRAA 50 (L) 01/10/2017    Lab Results  Component Value Date   WBC 8.0 01/10/2017   NEUTROABS 4.1 01/10/2017   HGB 13.2 01/10/2017   HCT 39.2 01/10/2017   MCV 94.2 01/10/2017   PLT 282 01/10/2017   Lab Results  Component Value Date   TOTALPROTELP 8.0 01/10/2017   ALBUMINELP 3.9 01/10/2017   A1GS 0.3 01/10/2017   A2GS 1.1 (H) 01/10/2017   BETS 1.2 01/10/2017   GAMS 1.5 01/10/2017   MSPIKE 0.8 (H) 01/10/2017   SPEI Comment 01/10/2017     STUDIES: No results found.  ASSESSMENT: MGUS  PLAN:    1.  MGUS: Patient's most recent M spike is unchanged at 0.8.  Her kappa free light chains are also relatively unchanged.  She has no evidence of endorgan damage other than some mild renal insufficiency which is chronic.  Previously, metastatic bone survey was reported as negative.  This does not need to be repeated unless there is concern of progression of disease.  Patient does not require bone marrow biopsy at this time.  Return to clinic in 6 months for repeat laboratory work and routine evaluation.   2.  Chronic renal insufficiency: Stable. Treatment per Dr. Holley Raring. 3.  Hyperglycemia:  Blood sugars are better controlled.  Continue current diabetic medications. Treatment per PCP. 4.  Peripheral neuropathy: Secondary to diabetes.  Monitor.  Patient expressed understanding and was in agreement with this plan. She also understands that She can call clinic at any time with any questions, concerns, or complaints.   Lloyd Huger, MD   01/15/2017 3:00 PM

## 2017-01-15 ENCOUNTER — Inpatient Hospital Stay (HOSPITAL_BASED_OUTPATIENT_CLINIC_OR_DEPARTMENT_OTHER): Payer: Medicare HMO | Admitting: Oncology

## 2017-01-15 VITALS — BP 139/79 | HR 102 | Temp 97.9°F | Resp 18 | Wt 164.2 lb

## 2017-01-15 DIAGNOSIS — D472 Monoclonal gammopathy: Secondary | ICD-10-CM

## 2017-01-15 DIAGNOSIS — R69 Illness, unspecified: Secondary | ICD-10-CM | POA: Diagnosis not present

## 2017-01-15 DIAGNOSIS — N189 Chronic kidney disease, unspecified: Secondary | ICD-10-CM

## 2017-01-15 DIAGNOSIS — Z7982 Long term (current) use of aspirin: Secondary | ICD-10-CM | POA: Diagnosis not present

## 2017-01-15 DIAGNOSIS — I129 Hypertensive chronic kidney disease with stage 1 through stage 4 chronic kidney disease, or unspecified chronic kidney disease: Secondary | ICD-10-CM

## 2017-01-15 DIAGNOSIS — Z79899 Other long term (current) drug therapy: Secondary | ICD-10-CM

## 2017-01-15 DIAGNOSIS — K219 Gastro-esophageal reflux disease without esophagitis: Secondary | ICD-10-CM | POA: Diagnosis not present

## 2017-01-15 DIAGNOSIS — M1990 Unspecified osteoarthritis, unspecified site: Secondary | ICD-10-CM | POA: Diagnosis not present

## 2017-01-15 DIAGNOSIS — E669 Obesity, unspecified: Secondary | ICD-10-CM | POA: Diagnosis not present

## 2017-01-15 DIAGNOSIS — E1122 Type 2 diabetes mellitus with diabetic chronic kidney disease: Secondary | ICD-10-CM

## 2017-01-15 DIAGNOSIS — E785 Hyperlipidemia, unspecified: Secondary | ICD-10-CM | POA: Diagnosis not present

## 2017-01-26 ENCOUNTER — Other Ambulatory Visit: Payer: Self-pay | Admitting: Unknown Physician Specialty

## 2017-01-30 ENCOUNTER — Other Ambulatory Visit: Payer: Self-pay | Admitting: Unknown Physician Specialty

## 2017-02-04 ENCOUNTER — Ambulatory Visit (INDEPENDENT_AMBULATORY_CARE_PROVIDER_SITE_OTHER): Payer: Medicare HMO | Admitting: Unknown Physician Specialty

## 2017-02-04 ENCOUNTER — Encounter: Payer: Self-pay | Admitting: Unknown Physician Specialty

## 2017-02-04 VITALS — BP 109/65 | HR 91 | Temp 97.9°F | Wt 162.8 lb

## 2017-02-04 DIAGNOSIS — I129 Hypertensive chronic kidney disease with stage 1 through stage 4 chronic kidney disease, or unspecified chronic kidney disease: Secondary | ICD-10-CM | POA: Diagnosis not present

## 2017-02-04 DIAGNOSIS — E1122 Type 2 diabetes mellitus with diabetic chronic kidney disease: Secondary | ICD-10-CM | POA: Diagnosis not present

## 2017-02-04 DIAGNOSIS — N182 Chronic kidney disease, stage 2 (mild): Secondary | ICD-10-CM

## 2017-02-04 DIAGNOSIS — E7800 Pure hypercholesterolemia, unspecified: Secondary | ICD-10-CM

## 2017-02-04 DIAGNOSIS — Z794 Long term (current) use of insulin: Secondary | ICD-10-CM

## 2017-02-04 LAB — BAYER DCA HB A1C WAIVED: HB A1C: 7 % — AB (ref <7.0)

## 2017-02-04 MED ORDER — ALLOPURINOL 300 MG PO TABS: 300.0000 mg | ORAL_TABLET | Freq: Every day | ORAL | 3 refills | Status: DC

## 2017-02-04 MED ORDER — SERTRALINE HCL 100 MG PO TABS: 100.0000 mg | ORAL_TABLET | Freq: Every morning | ORAL | 1 refills | Status: DC

## 2017-02-04 NOTE — Assessment & Plan Note (Signed)
Stable with Hgb A1C at 7.0, continue present medications

## 2017-02-04 NOTE — Assessment & Plan Note (Signed)
Stable, continue present medications.   

## 2017-02-04 NOTE — Progress Notes (Signed)
BP 109/65 (BP Location: Left Arm, Cuff Size: Normal)   Pulse 91   Temp 97.9 F (36.6 C) (Oral)   Wt 162 lb 12.8 oz (73.8 kg)   LMP  (LMP Unknown)   SpO2 95%   BMI 32.99 kg/m    Subjective:    Patient ID: Kara Leach, female    DOB: 07/23/1950, 66 y.o.   MRN: [id number]  HPI: Kara Leach is a 65 y.o. female  Chief Complaint  Patient presents with  . Depression  . Diabetes    pt states last eye exam in chart is correct   . Hyperlipidemia  . Hypertension   Diabetes: Using medications without difficulties.  She is taking 16 u of Novolog at mealtime.  60 units of Levimir.  Thinks the Victoza is doing it's job at 1.2. No hypoglycemic episodes No hyperglycemic episodes Feet problems:none Blood Sugars averaging: 120 Last Hgb A1C: 7.0  Hypertension  Using medications without difficulty Average home BPs   Using medication without problems or lightheadedness No chest pain with exertion or shortness of breath No Edema  Elevated Cholesterol Using medications without problems No Muscle aches  Diet: Exercise: doing well.  Using Silver Sneakers   Relevant past medical, surgical, family and social history reviewed and updated as indicated. Interim medical history since our last visit reviewed. Allergies and medications reviewed and updated.  Review of Systems  Constitutional: Negative.   Respiratory: Negative.   Cardiovascular: Negative.   Psychiatric/Behavioral: Negative.     Per HPI unless specifically indicated above     Objective:    BP 109/65 (BP Location: Left Arm, Cuff Size: Normal)   Pulse 91   Temp 97.9 F (36.6 C) (Oral)   Wt 162 lb 12.8 oz (73.8 kg)   LMP  (LMP Unknown)   SpO2 95%   BMI 32.99 kg/m   Wt Readings from Last 3 Encounters:  02/04/17 162 lb 12.8 oz (73.8 kg)  01/15/17 164 lb 3.2 oz (74.5 kg)  10/31/16 166 lb 12.8 oz (75.7 kg)    Physical Exam  Constitutional: She is oriented to person, place, and time. She appears  well-developed and well-nourished. No distress.  HENT:  Head: Normocephalic and atraumatic.  Eyes: Conjunctivae and lids are normal. Right eye exhibits no discharge. Left eye exhibits no discharge. No scleral icterus.  Neck: Normal range of motion. Neck supple. No JVD present. Carotid bruit is not present.  Cardiovascular: Normal rate, regular rhythm and normal heart sounds.  Pulmonary/Chest: Effort normal and breath sounds normal.  Abdominal: Normal appearance. There is no splenomegaly or hepatomegaly.  Musculoskeletal: Normal range of motion.  Neurological: She is alert and oriented to person, place, and time.  Skin: Skin is warm, dry and intact. No rash noted. No pallor.  Psychiatric: She has a normal mood and affect. Her behavior is normal. Judgment and thought content normal.    Results for orders placed or performed in visit on 01/10/17  Protein electrophoresis, serum  Result Value Ref Range   Total Protein ELP 8.0 6.0 - 8.5 g/dL   Albumin ELP 3.9 2.9 - 4.4 g/dL   Alpha-1-Globulin 0.3 0.0 - 0.4 g/dL   Alpha-2-Globulin 1.1 (H) 0.4 - 1.0 g/dL   Beta Globulin 1.2 0.7 - 1.3 g/dL   Gamma Globulin 1.5 0.4 - 1.8 g/dL   M-Spike, % 0.8 (H) Not Observed g/dL   SPE Interp. Comment    Comment Comment    GLOBULIN, TOTAL 4.1 (H) 2.2 - 3.9 g/dL  A/G Ratio 1.0 0.7 - 1.7  Basic metabolic panel  Result Value Ref Range   Sodium 138 135 - 145 mmol/L   Potassium 4.4 3.5 - 5.1 mmol/L   Chloride 103 101 - 111 mmol/L   CO2 25 22 - 32 mmol/L   Glucose, Bld 138 (H) 65 - 99 mg/dL   BUN 20 6 - 20 mg/dL   Creatinine, Ser 1.28 (H) 0.44 - 1.00 mg/dL   Calcium 9.8 8.9 - 10.3 mg/dL   GFR calc non Af Amer 44 (L) >60 mL/min   GFR calc Af Amer 50 (L) >60 mL/min   Anion gap 10 5 - 15  CBC with Differential  Result Value Ref Range   WBC 8.0 3.6 - 11.0 K/uL   RBC 4.16 3.80 - 5.20 MIL/uL   Hemoglobin 13.2 12.0 - 16.0 g/dL   HCT 39.2 35.0 - 47.0 %   MCV 94.2 80.0 - 100.0 fL   MCH 31.8 26.0 - 34.0 pg    MCHC 33.7 32.0 - 36.0 g/dL   RDW 14.4 11.5 - 14.5 %   Platelets 282 150 - 440 K/uL   Neutrophils Relative % 50 %   Neutro Abs 4.1 1.4 - 6.5 K/uL   Lymphocytes Relative 38 %   Lymphs Abs 3.0 1.0 - 3.6 K/uL   Monocytes Relative 8 %   Monocytes Absolute 0.6 0.2 - 0.9 K/uL   Eosinophils Relative 4 %   Eosinophils Absolute 0.3 0 - 0.7 K/uL   Basophils Relative 0 %   Basophils Absolute 0.0 0 - 0.1 K/uL  Kappa/lambda light chains  Result Value Ref Range   Kappa free light chain 35.5 (H) 3.3 - 19.4 mg/L   Lamda free light chains 23.8 5.7 - 26.3 mg/L   Kappa, lamda light chain ratio 1.49 0.26 - 1.65      Assessment & Plan:   Problem List Items Addressed This Visit      Unprioritized   Controlled type 2 diabetes mellitus with chronic kidney disease, with long-term current use of insulin (HCC) - Primary    Stable with Hgb A1C at 7.0, continue present medications      Relevant Orders   Comprehensive metabolic panel   Bayer DCA Hb A1c Waived   Hyperlipidemia    Stable, continue present medications.        Hypertensive CKD (chronic kidney disease)    Stable, continue present medications.            Follow up plan: Return in about 3 months (around 05/07/2017).

## 2017-02-05 ENCOUNTER — Encounter: Payer: Self-pay | Admitting: Unknown Physician Specialty

## 2017-02-05 LAB — COMPREHENSIVE METABOLIC PANEL
A/G RATIO: 1.4 (ref 1.2–2.2)
ALK PHOS: 111 IU/L (ref 39–117)
ALT: 25 IU/L (ref 0–32)
AST: 56 IU/L — AB (ref 0–40)
Albumin: 4.5 g/dL (ref 3.6–4.8)
BILIRUBIN TOTAL: 0.3 mg/dL (ref 0.0–1.2)
BUN/Creatinine Ratio: 17 (ref 12–28)
BUN: 20 mg/dL (ref 8–27)
CO2: 24 mmol/L (ref 20–29)
Calcium: 9.8 mg/dL (ref 8.7–10.3)
Chloride: 102 mmol/L (ref 96–106)
Creatinine, Ser: 1.19 mg/dL — ABNORMAL HIGH (ref 0.57–1.00)
GFR calc Af Amer: 56 mL/min/{1.73_m2} — ABNORMAL LOW (ref >59)
GFR, EST NON AFRICAN AMERICAN: 48 mL/min/{1.73_m2} — AB (ref >59)
GLOBULIN, TOTAL: 3.2 g/dL (ref 1.5–4.5)
Glucose: 141 mg/dL — ABNORMAL HIGH (ref 65–99)
POTASSIUM: 4.9 mmol/L (ref 3.5–5.2)
SODIUM: 140 mmol/L (ref 134–144)
Total Protein: 7.7 g/dL (ref 6.0–8.5)

## 2017-03-14 ENCOUNTER — Telehealth: Payer: Self-pay | Admitting: Unknown Physician Specialty

## 2017-03-14 NOTE — Telephone Encounter (Signed)
Copied from [place] [id number]. Topic: Quick Communication - Rx Refill/Question >> Mar 14, 2017  2:30 PM Robina Ade, Helene Kelp D wrote: Has the patient contacted their pharmacy? Yes (Agent: If no, request that the patient contact the pharmacy for the refill.) Preferred Pharmacy (with phone number or street name): [id number] - [place], [address] Agent: Please be advised that RX refills may take up to 3 business days. We ask that you follow-up with your pharmacy. Patient called and would like to talk to [facility] or her CMA about her medication LEVEMIR FLEXTOUCH 100 UNIT/ML Pen. Patient states that she used to get 5 pens but now is only getting 4. She would like to know the reason for this. Please call patient back, thanks.

## 2017-03-14 NOTE — Telephone Encounter (Signed)
Routing to provider. Malachy Mood do you know why the patient may be getting 4 pens instead of 5? The only thing I see is that the last RX was written for 15 mL and most recent is written for 4 pens.

## 2017-03-15 NOTE — Telephone Encounter (Signed)
I am happy to write 5 pens if that works best.  Does she need it called in?

## 2017-03-15 NOTE — Telephone Encounter (Signed)
Called and left patient a VM letting her know what Malachy Mood said about medication. Asked for patient to call back and let us know if she would like to have the prescription changed to 5 pens instead of 4.

## 2017-04-25 IMAGING — CT CT IMAGE GUIDED FLUID DRAIN BY CATHETER
2 of 4 series · 7 of 16 positions shown, 9 images · non-contrast
Comparison: none

INDICATION: 64-year-old with a diverticular abscess in the left abdomen.

[Series 2: routine abd pel with · axial · 0.84mm/px · z∈[+1052,+1207]mm · 5 of 47 slices shown, 7 images (1 of 2)]
[im 8/47  soft-tissue]
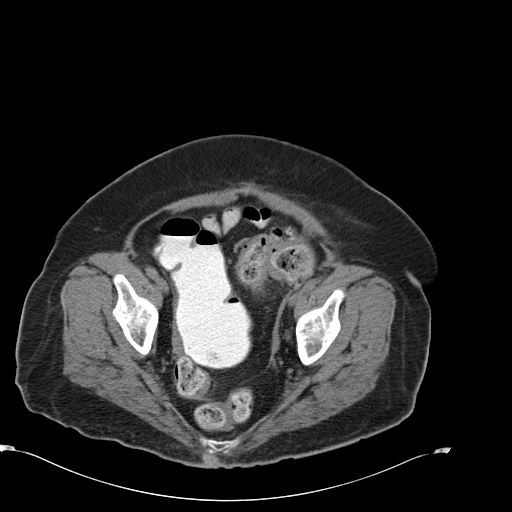
[im 8/47  bone]
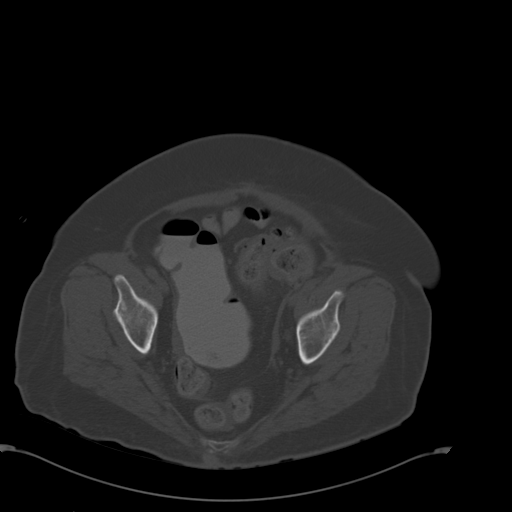
[im 16/47  soft-tissue]
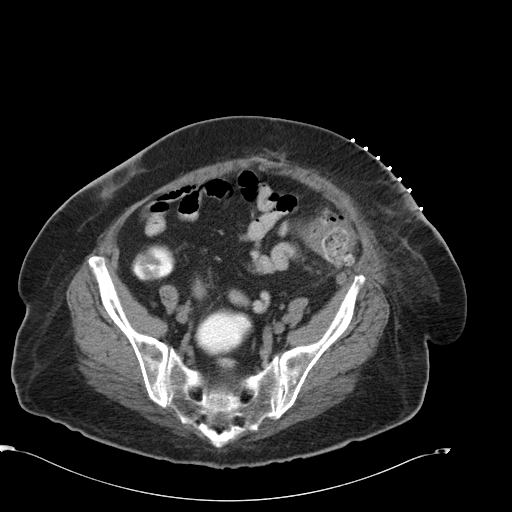
[im 24/47  soft-tissue]
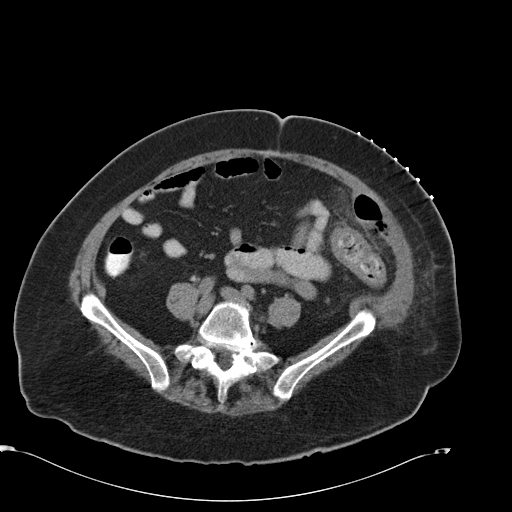
[im 31/47  soft-tissue]
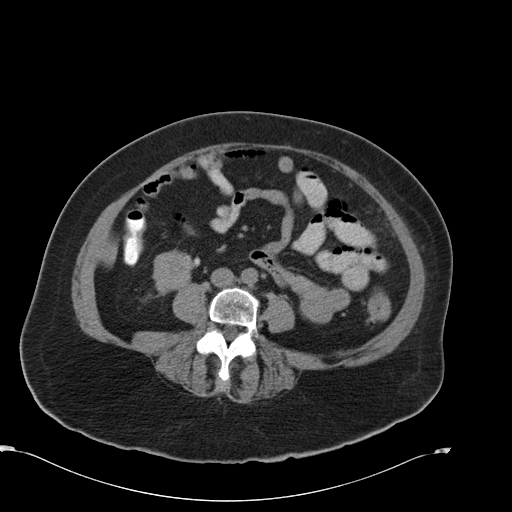
[im 39/47  soft-tissue]
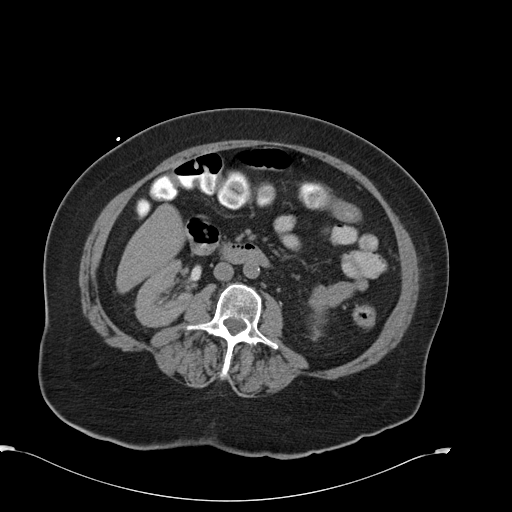
[im 39/47  bone]
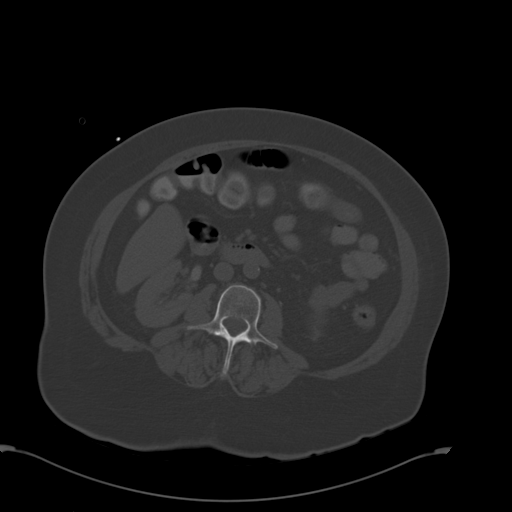

[Series 7: routine abd pel with · axial · 0.84mm/px · z∈[+1118,+1148]mm · 2 of 20 slices shown (2 of 2)]
[im 7/20  soft-tissue]
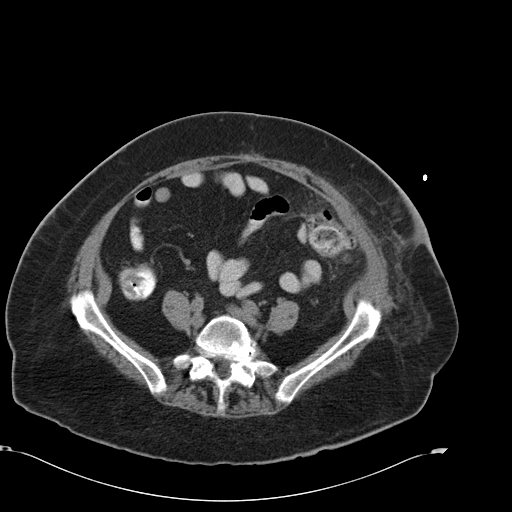
[im 13/20  soft-tissue]
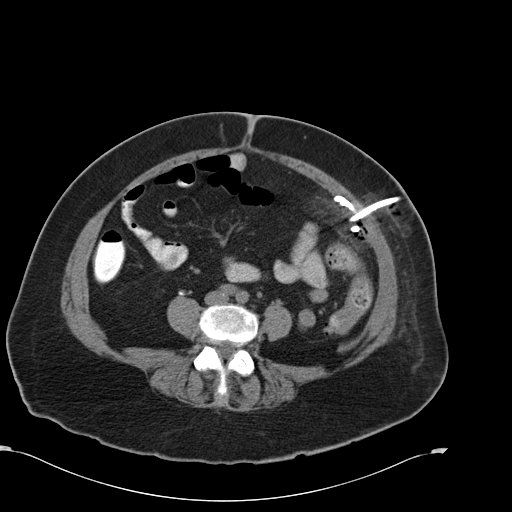

[7 of 16 positions shown; findings below may reference images not displayed]

EXAM:
CT-GUIDED DRAINAGE OF LEFT ABDOMINAL ABSCESS

MEDICATIONS:
The patient is currently admitted to the hospital and receiving
intravenous antibiotics. The antibiotics were administered within an
appropriate time frame prior to the initiation of the procedure.

ANESTHESIA/SEDATION:
Fentanyl 75 mcg IV; Versed 2.0 mg IV

Moderate Sedation Time:  43 minutes

The patient was continuously monitored during the procedure by the
interventional radiology nurse under my direct supervision.

COMPLICATIONS:
None immediate.

PROCEDURE:
Informed written consent was obtained from the patient after a
thorough discussion of the procedural risks, benefits and
alternatives. All questions were addressed. A timeout was performed
prior to the initiation of the procedure.

Patient was placed supine on the CT scanner. Images through the
abdomen obtained. The left side of the abdomen was marked. The skin
was prepped with chlorhexidine and sterile field was created. Skin
and soft tissues anesthetized with 1% lidocaine. A 18 gauge needle
was directed into the abscess collection with CT guidance. Small
amount of air and purulent fluid was obtained. A wire was advanced
in the collection. Tract was dilated and advanced a 10.2 French
drain over the wire. Unfortunately, the catheter formed superficial
to the abdominal wall musculature. Needle was again advanced into
the abscess but the wire went through the collection. Therefore, a
Yueh catheter was used to puncture the abscess. The stiff Amplatz
wire was coiled in the small collection and the tract was dilated to
10 French. A 10.2 French multipurpose drain was successfully
advanced into the collection and approximately 5 mL of purulent
fluid was aspirated. CT images confirmed decompression of the
abscess cavity. Catheter was sutured to skin and attached to a
suction bulb.
FINDINGS: Small air-fluid collection along the anterior lateral abdominal
cavity and there may be some involvement in the abdominal wall. It
was difficult to advance a catheter and wire into this collection
due to its small size and location. Eventually, catheter was placed
and the abscess collection was decompressed. 5 mL of bloody purulent
fluid was removed. Fluid was sent for culture.
IMPRESSION: Successful placement of a CT-guided drain within the left abdominal
abscess collection. Purulent fluid was sent for culture.

## 2017-04-28 ENCOUNTER — Other Ambulatory Visit: Payer: Self-pay | Admitting: Unknown Physician Specialty

## 2017-05-07 ENCOUNTER — Ambulatory Visit: Payer: Medicare HMO | Admitting: Unknown Physician Specialty

## 2017-05-08 DIAGNOSIS — E119 Type 2 diabetes mellitus without complications: Secondary | ICD-10-CM | POA: Diagnosis not present

## 2017-05-08 DIAGNOSIS — H40003 Preglaucoma, unspecified, bilateral: Secondary | ICD-10-CM | POA: Diagnosis not present

## 2017-05-08 LAB — HM DIABETES EYE EXAM

## 2017-05-09 ENCOUNTER — Encounter: Payer: Self-pay | Admitting: Unknown Physician Specialty

## 2017-05-09 ENCOUNTER — Ambulatory Visit (INDEPENDENT_AMBULATORY_CARE_PROVIDER_SITE_OTHER): Payer: Medicare HMO | Admitting: Unknown Physician Specialty

## 2017-05-09 VITALS — BP 126/79 | HR 78 | Temp 97.9°F | Ht 58.6 in | Wt 162.9 lb

## 2017-05-09 DIAGNOSIS — R69 Illness, unspecified: Secondary | ICD-10-CM | POA: Diagnosis not present

## 2017-05-09 DIAGNOSIS — M25562 Pain in left knee: Secondary | ICD-10-CM | POA: Insufficient documentation

## 2017-05-09 DIAGNOSIS — E1122 Type 2 diabetes mellitus with diabetic chronic kidney disease: Secondary | ICD-10-CM | POA: Diagnosis not present

## 2017-05-09 DIAGNOSIS — Z794 Long term (current) use of insulin: Secondary | ICD-10-CM

## 2017-05-09 DIAGNOSIS — F324 Major depressive disorder, single episode, in partial remission: Secondary | ICD-10-CM | POA: Diagnosis not present

## 2017-05-09 DIAGNOSIS — G8929 Other chronic pain: Secondary | ICD-10-CM | POA: Diagnosis not present

## 2017-05-09 DIAGNOSIS — N182 Chronic kidney disease, stage 2 (mild): Secondary | ICD-10-CM

## 2017-05-09 LAB — BAYER DCA HB A1C WAIVED: HB A1C: 6.6 % (ref <7.0)

## 2017-05-09 MED ORDER — INSULIN DETEMIR 100 UNIT/ML FLEXPEN: 60.0000 [IU] | PEN_INJECTOR | Freq: Every day | SUBCUTANEOUS | 3 refills | Status: DC

## 2017-05-09 NOTE — Assessment & Plan Note (Signed)
Hgb A1C today is 6.6%  Continue present medications

## 2017-05-09 NOTE — Progress Notes (Signed)
BP 126/79   Pulse 78   Temp 97.9 F (36.6 C) (Oral)   Ht 4' 10.6" (1.488 m)   Wt 162 lb 14.4 oz (73.9 kg)   LMP  (LMP Unknown)   SpO2 98%   BMI 33.35 kg/m    Subjective:    Patient ID: Kara Leach, female    DOB: 1950-04-04, 67 y.o.   MRN: [id number]  HPI: Kara Leach is a 67 y.o. female  Chief Complaint  Patient presents with  . Depression  . Diabetes    pt states she had an eye exam yesterday and report was supposed to be sent over  . Hyperlipidemia  . Hypertension   Diabetes: Pt is here for a 3 month f/u DM She is taking 16 u of Novolog at mealtime.  60 units of Levimir.  Taking daily Victoza at 1.2.  Asking for 6 pens for every 30 days for 18 pens at a time.   No hypoglycemic episodes No hyperglycemic episodes Feet problems: tingling and sometimes numbness Blood Sugars averaging: 120 Last Hgb A1C: 7.0  Hypertension  Using medications without difficulty Average home BPs: Not checking               Using medication without problems or lightheadedness No chest pain with exertion or shortness of breath No Edema  Elevated Cholesterol Using medications without problems No Muscle aches  Diet: Exercise: doing well.  "Kind of" using Silver Sneakers  Left knee pain S/P surgery left knee.  Needs to be replaced.  Would like a handicapped sticker.    Depression Feels she is doing "better."  There are just "times." Depression screen [facility] 2/9 05/09/2017 02/04/2017 10/31/2016 05/02/2016 08/20/2014  Decreased Interest 1 1 1 2  0  Down, Depressed, Hopeless 1 2 1 2  0  PHQ - 2 Score 2 3 2 4  0  Altered sleeping 2 3 3 2  -  Tired, decreased energy 0 3 1 2  -  Change in appetite 0 3 1 2  -  Feeling bad or failure about yourself  1 2 0 2 -  Trouble concentrating 1 0 0 0 -  Moving slowly or fidgety/restless 0 0 0 0 -  Suicidal thoughts 0 0 0 0 -  PHQ-9 Score 6 14 7 12  -   Relevant past medical, surgical, family and social history reviewed and updated as indicated.  Interim medical history since our last visit reviewed. Allergies and medications reviewed and updated.  Review of Systems  Per HPI unless specifically indicated above     Objective:    BP 126/79   Pulse 78   Temp 97.9 F (36.6 C) (Oral)   Ht 4' 10.6" (1.488 m)   Wt 162 lb 14.4 oz (73.9 kg)   LMP  (LMP Unknown)   SpO2 98%   BMI 33.35 kg/m   Wt Readings from Last 3 Encounters:  05/09/17 162 lb 14.4 oz (73.9 kg)  02/04/17 162 lb 12.8 oz (73.8 kg)  01/15/17 164 lb 3.2 oz (74.5 kg)    Physical Exam  Constitutional: She is oriented to person, place, and time. She appears well-developed and well-nourished. No distress.  HENT:  Head: Normocephalic and atraumatic.  Eyes: Conjunctivae and lids are normal. Right eye exhibits no discharge. Left eye exhibits no discharge. No scleral icterus.  Neck: Normal range of motion. Neck supple. No JVD present. Carotid bruit is not present.  Cardiovascular: Normal rate, regular rhythm and normal heart sounds.  Pulmonary/Chest: Effort normal and breath  sounds normal.  Abdominal: Normal appearance. There is no splenomegaly or hepatomegaly.  Musculoskeletal: Normal range of motion.  Scarring left knee  Neurological: She is alert and oriented to person, place, and time.  Skin: Skin is warm, dry and intact. No rash noted. No pallor.  Psychiatric: She has a normal mood and affect. Her behavior is normal. Judgment and thought content normal.    Results for orders placed or performed in visit on 02/04/17  Comprehensive metabolic panel  Result Value Ref Range   Glucose 141 (H) 65 - 99 mg/dL   BUN 20 8 - 27 mg/dL   Creatinine, Ser 1.19 (H) 0.57 - 1.00 mg/dL   GFR calc non Af Amer 48 (L) >59 mL/min/1.73   GFR calc Af Amer 56 (L) >59 mL/min/1.73   BUN/Creatinine Ratio 17 12 - 28   Sodium 140 134 - 144 mmol/L   Potassium 4.9 3.5 - 5.2 mmol/L   Chloride 102 96 - 106 mmol/L   CO2 24 20 - 29 mmol/L   Calcium 9.8 8.7 - 10.3 mg/dL   Total Protein 7.7  6.0 - 8.5 g/dL   Albumin 4.5 3.6 - 4.8 g/dL   Globulin, Total 3.2 1.5 - 4.5 g/dL   Albumin/Globulin Ratio 1.4 1.2 - 2.2   Bilirubin Total 0.3 0.0 - 1.2 mg/dL   Alkaline Phosphatase 111 39 - 117 IU/L   AST 56 (H) 0 - 40 IU/L   ALT 25 0 - 32 IU/L  Bayer DCA Hb A1c Waived  Result Value Ref Range   Bayer DCA Hb A1c Waived 7.0 (H) <7.0 %      Assessment & Plan:   Problem List Items Addressed This Visit      Unprioritized   Controlled type 2 diabetes mellitus with chronic kidney disease, with long-term current use of insulin ([place]) - Primary    Hgb A1C today is 6.6%  Continue present medications      Relevant Medications   Insulin Detemir (LEVEMIR FLEXTOUCH) 100 UNIT/ML Pen   Other Relevant Orders   Comprehensive metabolic panel   Bayer DCA Hb A1c Waived   Depression, major, single episode, in partial remission (HCC)    Doing well with Zoloft 100 mg.  Discussed exercise for added benefit      Left knee pain    Secondary to multiple injuries          Follow up plan: Return in about 6 months (around 11/06/2017).

## 2017-05-09 NOTE — Assessment & Plan Note (Signed)
Secondary to multiple injuries

## 2017-05-09 NOTE — Assessment & Plan Note (Addendum)
Doing well with Zoloft 100 mg.  Discussed exercise for added benefit

## 2017-05-10 ENCOUNTER — Encounter: Payer: Self-pay | Admitting: Unknown Physician Specialty

## 2017-05-10 LAB — COMPREHENSIVE METABOLIC PANEL
ALK PHOS: 102 IU/L (ref 39–117)
ALT: 31 IU/L (ref 0–32)
AST: 47 IU/L — AB (ref 0–40)
Albumin/Globulin Ratio: 1.6 (ref 1.2–2.2)
Albumin: 4.7 g/dL (ref 3.6–4.8)
BUN/Creatinine Ratio: 18 (ref 12–28)
BUN: 19 mg/dL (ref 8–27)
Bilirubin Total: 0.3 mg/dL (ref 0.0–1.2)
CHLORIDE: 102 mmol/L (ref 96–106)
CO2: 21 mmol/L (ref 20–29)
Calcium: 9.6 mg/dL (ref 8.7–10.3)
Creatinine, Ser: 1.07 mg/dL — ABNORMAL HIGH (ref 0.57–1.00)
GFR calc non Af Amer: 55 mL/min/{1.73_m2} — ABNORMAL LOW (ref >59)
GFR, EST AFRICAN AMERICAN: 63 mL/min/{1.73_m2} (ref >59)
GLUCOSE: 117 mg/dL — AB (ref 65–99)
Globulin, Total: 3 g/dL (ref 1.5–4.5)
Potassium: 4.6 mmol/L (ref 3.5–5.2)
Sodium: 139 mmol/L (ref 134–144)
TOTAL PROTEIN: 7.7 g/dL (ref 6.0–8.5)

## 2017-05-24 ENCOUNTER — Other Ambulatory Visit: Payer: Self-pay | Admitting: Unknown Physician Specialty

## 2017-05-28 ENCOUNTER — Other Ambulatory Visit: Payer: Self-pay | Admitting: Unknown Physician Specialty

## 2017-07-09 ENCOUNTER — Inpatient Hospital Stay: Payer: Medicare HMO | Attending: Oncology

## 2017-07-09 DIAGNOSIS — E1140 Type 2 diabetes mellitus with diabetic neuropathy, unspecified: Secondary | ICD-10-CM | POA: Insufficient documentation

## 2017-07-09 DIAGNOSIS — Z7982 Long term (current) use of aspirin: Secondary | ICD-10-CM | POA: Insufficient documentation

## 2017-07-09 DIAGNOSIS — D472 Monoclonal gammopathy: Secondary | ICD-10-CM | POA: Diagnosis not present

## 2017-07-09 DIAGNOSIS — E1122 Type 2 diabetes mellitus with diabetic chronic kidney disease: Secondary | ICD-10-CM | POA: Diagnosis not present

## 2017-07-09 DIAGNOSIS — I129 Hypertensive chronic kidney disease with stage 1 through stage 4 chronic kidney disease, or unspecified chronic kidney disease: Secondary | ICD-10-CM | POA: Diagnosis not present

## 2017-07-09 DIAGNOSIS — N182 Chronic kidney disease, stage 2 (mild): Secondary | ICD-10-CM | POA: Insufficient documentation

## 2017-07-09 DIAGNOSIS — Z79899 Other long term (current) drug therapy: Secondary | ICD-10-CM | POA: Diagnosis not present

## 2017-07-09 LAB — CBC WITH DIFFERENTIAL/PLATELET
BASOS ABS: 0.1 10*3/uL (ref 0–0.1)
Basophils Relative: 1 %
EOS PCT: 3 %
Eosinophils Absolute: 0.3 10*3/uL (ref 0–0.7)
HEMATOCRIT: 37.8 % (ref 35.0–47.0)
Hemoglobin: 13 g/dL (ref 12.0–16.0)
Lymphocytes Relative: 47 %
Lymphs Abs: 3.9 10*3/uL — ABNORMAL HIGH (ref 1.0–3.6)
MCH: 32.4 pg (ref 26.0–34.0)
MCHC: 34.3 g/dL (ref 32.0–36.0)
MCV: 94.3 fL (ref 80.0–100.0)
MONO ABS: 0.6 10*3/uL (ref 0.2–0.9)
MONOS PCT: 8 %
Neutro Abs: 3.4 10*3/uL (ref 1.4–6.5)
Neutrophils Relative %: 41 %
PLATELETS: 284 10*3/uL (ref 150–440)
RBC: 4.01 MIL/uL (ref 3.80–5.20)
RDW: 14.2 % (ref 11.5–14.5)
WBC: 8.3 10*3/uL (ref 3.6–11.0)

## 2017-07-09 LAB — BASIC METABOLIC PANEL
Anion gap: 9 (ref 5–15)
BUN: 19 mg/dL (ref 6–20)
CALCIUM: 9.8 mg/dL (ref 8.9–10.3)
CO2: 24 mmol/L (ref 22–32)
Chloride: 106 mmol/L (ref 101–111)
Creatinine, Ser: 1.12 mg/dL — ABNORMAL HIGH (ref 0.44–1.00)
GFR calc Af Amer: 59 mL/min — ABNORMAL LOW (ref >60)
GFR, EST NON AFRICAN AMERICAN: 51 mL/min — AB (ref >60)
GLUCOSE: 142 mg/dL — AB (ref 65–99)
Potassium: 4.2 mmol/L (ref 3.5–5.1)
Sodium: 139 mmol/L (ref 135–145)

## 2017-07-10 LAB — PROTEIN ELECTROPHORESIS, SERUM
A/G Ratio: 1 (ref 0.7–1.7)
ALBUMIN ELP: 3.8 g/dL (ref 2.9–4.4)
ALPHA-1-GLOBULIN: 0.3 g/dL (ref 0.0–0.4)
Alpha-2-Globulin: 1.1 g/dL — ABNORMAL HIGH (ref 0.4–1.0)
BETA GLOBULIN: 1.1 g/dL (ref 0.7–1.3)
GAMMA GLOBULIN: 1.4 g/dL (ref 0.4–1.8)
Globulin, Total: 3.9 g/dL (ref 2.2–3.9)
M-SPIKE, %: 0.8 g/dL — AB
Total Protein ELP: 7.7 g/dL (ref 6.0–8.5)

## 2017-07-10 LAB — PROTEIN ELECTRO, RANDOM URINE
ALPHA-2-GLOBULIN, U: 7.5 %
Albumin ELP, Urine: 45.6 %
Alpha-1-Globulin, U: 2.2 %
Beta Globulin, U: 22 %
Gamma Globulin, U: 22.7 %
Total Protein, Urine: 16.7 mg/dL

## 2017-07-10 LAB — KAPPA/LAMBDA LIGHT CHAINS
KAPPA FREE LGHT CHN: 41.5 mg/L — AB (ref 3.3–19.4)
KAPPA, LAMDA LIGHT CHAIN RATIO: 2.26 — AB (ref 0.26–1.65)
LAMDA FREE LIGHT CHAINS: 18.4 mg/L (ref 5.7–26.3)

## 2017-07-14 NOTE — Progress Notes (Signed)
[facility]  [phone] [phone]) [phone]  ID: HAZELINE CHARNLEY OB: 07-10-1950  MR#: [id number]  HAL#:[id number]  Patient Care Team: Kathrine Haddock, NP as PCP - General (Nurse Practitioner) Valerie Roys, DO as Referring Physician (Family Medicine) Bary Castilla Forest Gleason, MD (General Surgery)  CHIEF COMPLAINT: MGUS  INTERVAL HISTORY: Patient returns to clinic today for routine six-month follow-up and discussion of her laboratory work.  She continues to feel well and remains asymptomatic. She denies any recent fevers or illnesses. She has a good appetite and denies weight loss.  She has a mild peripheral neuropathy, but no other neurologic complaints.  She denies any bony pain.  She has no chest pain or shortness of breath.  She denies any nausea, vomiting, constipation, or diarrhea.  She has no urinary complaints.  Patient feels at her baseline offers no specific complaints today.  REVIEW OF SYSTEMS:   Review of Systems  Constitutional: Negative.  Negative for fever, malaise/fatigue and weight loss.  Respiratory: Negative.  Negative for shortness of breath.   Cardiovascular: Negative.  Negative for chest pain and leg swelling.  Gastrointestinal: Negative.  Negative for abdominal pain.  Genitourinary: Negative.  Negative for dysuria.  Musculoskeletal: Negative.  Negative for back pain.  Skin: Negative.  Negative for rash.  Neurological: Positive for sensory change. Negative for focal weakness and weakness.  Psychiatric/Behavioral: Negative.  The patient is not nervous/anxious.     As per HPI. Otherwise, a complete review of systems is negative.  PAST MEDICAL HISTORY: Past Medical History:  Diagnosis Date  . Allergy   . Arthritis    Osteoarthritis  . Chronic kidney disease   . Depression   . Diabetes mellitus without complication ([place]) 7353  . Diabetic neuropathy (HCC)    feet  . Diverticulitis 2017  . GERD (gastroesophageal reflux disease)   .  Headache    sinus  . Hyperlipidemia   . Hypertension   . Last menstrual period (LMP) > 10 days ago 1986  . Obesity   . Renal insufficiency    Stage 2 kidney disease  . Seizures ([place])    due to high blood sugars. last time = 09/2014  . Sleep apnea    has CPAP, can't tolerate  . Wears hearing aid    right side  . Wrist weakness    left - after fracture and repair    PAST SURGICAL HISTORY: Past Surgical History:  Procedure Laterality Date  . ABDOMINAL HYSTERECTOMY    . APPENDECTOMY    . BREAST BIOPSY Left 07/03/2016   bx done by [facility] path pending  . CHOLECYSTECTOMY  2012  . COLONOSCOPY WITH PROPOFOL N/A 12/01/2015   Procedure: COLONOSCOPY WITH PROPOFOL;  Surgeon: [name], MD;  Location: [facility];  Service: Endoscopy;  Laterality: N/A;  Diabetic - insulin Sleep apnea  . PATELLA FRACTURE SURGERY Left   . WRIST FRACTURE SURGERY Left    Steel plate    FAMILY HISTORY Family History  Problem Relation Age of Onset  . Diabetes Mother   . Heart disease Mother   . Hypertension Mother   . Diabetes Sister   . Cancer Father        stomach  . Arthritis Sister   . Stroke Sister   . Hypertension Sister   . Breast cancer Maternal Aunt 60       great aunt       ADVANCED DIRECTIVES:    HEALTH MAINTENANCE: Social History   Tobacco Use  .  Smoking status: Never Smoker  . Smokeless tobacco: Never Used  Substance Use Topics  . Alcohol use: Yes    Alcohol/week: 0.0 oz    Comment: occasionally holiday, weddings  . Drug use: No     Colonoscopy:  PAP:  Bone density:  Lipid panel:  Allergies  Allergen Reactions  . Ibuprofen Other (See Comments)    "ringing in ears"  . Strawberry Extract Swelling    Lips swell and rash over body    Current Outpatient Medications  Medication Sig Dispense Refill  . acetaminophen (TYLENOL ARTHRITIS PAIN) 650 MG CR tablet Take 650 mg by mouth every 8 (eight) hours as needed for pain.    Marland Kitchen allopurinol (ZYLOPRIM) 300 MG  tablet Take 1 tablet (300 mg total) daily by mouth. 90 tablet 3  . amLODipine (NORVASC) 5 MG tablet TAKE 1 TABLET BY MOUTH DAILY 90 tablet 0  . aspirin EC 81 MG tablet Take 81 mg by mouth daily.    . BD PEN NEEDLE NANO U/F 32G X 4 MM MISC USE EVERY MORNING 100 each 12  . Calcium Carb-Cholecalciferol (CALCIUM 600 + D PO) Take 1 tablet by mouth 2 (two) times daily.    . enalapril (VASOTEC) 2.5 MG tablet TAKE 1 TABLET BY MOUTH DAILY 90 tablet 2  . fluticasone (FLONASE) 50 MCG/ACT nasal spray Place 2 sprays into both nostrils daily as needed for allergies.     . Insulin Detemir (LEVEMIR FLEXTOUCH) 100 UNIT/ML Pen Inject 60 Units into the skin daily at 10 pm. 18 pen 3  . metoprolol succinate (TOPROL-XL) 25 MG 24 hr tablet TAKE 1 TABLET BY MOUTH DAILY 90 tablet 0  . NOVOLOG FLEXPEN 100 UNIT/ML FlexPen INJECT 15U UNDER THE SKIN 3 TIMES DAILY WITH MEALS 15 pen 12  . ranitidine (ZANTAC) 150 MG tablet Take 150 mg by mouth 2 (two) times daily.    . sertraline (ZOLOFT) 100 MG tablet Take 1 tablet (100 mg total) every morning by mouth. 90 tablet 1  . simvastatin (ZOCOR) 40 MG tablet TAKE 1 TABLET BY MOUTH AT BEDTIME 90 tablet 2  . Specialty Vitamins Products (VITA-RX DIABETIC VITAMIN) CAPS Take 1 packet by mouth daily. Diabetic Vitamins - 6 or 7 in 1 pack    . VICTOZA 18 MG/3ML SOPN ADMINISTER 1.2MG UNDER THE SKIN DAILY 3 pen 12   No current facility-administered medications for this visit.     OBJECTIVE: Vitals:   07/16/17 1131  BP: 133/72  Pulse: 94  Resp: 18  Temp: 97.6 F (36.4 C)     Body mass index is 33.35 kg/m.    ECOG FS:0 - Asymptomatic  General: Well-developed, well-nourished, no acute distress. Eyes: Pink conjunctiva, anicteric sclera. Lungs: Clear to auscultation bilaterally. Heart: Regular rate and rhythm. No rubs, murmurs, or gallops. Abdomen: Soft, nontender, nondistended. No organomegaly noted, normoactive bowel sounds. Musculoskeletal: No edema, cyanosis, or clubbing. Neuro:  Alert, answering all questions appropriately. Cranial nerves grossly intact. Skin: No rashes or petechiae noted. Psych: Normal affect.   LAB RESULTS:  Lab Results  Component Value Date   NA 139 07/09/2017   K 4.2 07/09/2017   CL 106 07/09/2017   CO2 24 07/09/2017   GLUCOSE 142 (H) 07/09/2017   BUN 19 07/09/2017   CREATININE 1.12 (H) 07/09/2017   CALCIUM 9.8 07/09/2017   PROT 7.7 05/09/2017   ALBUMIN 4.7 05/09/2017   AST 47 (H) 05/09/2017   ALT 31 05/09/2017   ALKPHOS 102 05/09/2017   BILITOT 0.3 05/09/2017  GFRNONAA 51 (L) 07/09/2017   GFRAA 59 (L) 07/09/2017    Lab Results  Component Value Date   WBC 8.3 07/09/2017   NEUTROABS 3.4 07/09/2017   HGB 13.0 07/09/2017   HCT 37.8 07/09/2017   MCV 94.3 07/09/2017   PLT 284 07/09/2017   Lab Results  Component Value Date   TOTALPROTELP 7.7 07/09/2017   ALBUMINELP 3.8 07/09/2017   A1GS 0.3 07/09/2017   A2GS 1.1 (H) 07/09/2017   BETS 1.1 07/09/2017   GAMS 1.4 07/09/2017   MSPIKE 0.8 (H) 07/09/2017   SPEI Comment 07/09/2017     STUDIES: No results found.  ASSESSMENT: MGUS  PLAN:    1.  MGUS: Patient's most recent M spike remains stable and unchanged at 0.8.  Her kappa free light chains remain mildly elevated, but essentially unchanged.  She has no evidence of endorgan damage.   Previously, metastatic bone survey was reported as negative.  This does not need to be repeated unless there is concern of progression of disease.  Patient does not require bone marrow biopsy at this time.  Return to clinic in 6 months with repeat laboratory work and further evaluation. 2.  Chronic renal insufficiency: Patient's creatinine appears to be at her baseline.  Treatment per Dr. Holley Raring. 3.  Hyperglycemia: Patient's blood sugar continues to be well controlled.  Approximately 20 minutes spent in discussion of which greater than 50% was consultation.  Continue current diabetic medications. Treatment per PCP. 4.  Peripheral neuropathy:  Secondary to diabetes. Monitor.  Approximately 20 minutes was spent in discussion of which greater than 50% was consultation.  Patient expressed understanding and was in agreement with this plan. She also understands that She can call clinic at any time with any questions, concerns, or complaints.   Lloyd Huger, MD   07/17/2017 4:49 PM

## 2017-07-16 ENCOUNTER — Inpatient Hospital Stay: Payer: Medicare HMO | Admitting: Oncology

## 2017-07-16 ENCOUNTER — Encounter: Payer: Self-pay | Admitting: Oncology

## 2017-07-16 VITALS — BP 133/72 | HR 94 | Temp 97.6°F | Resp 18 | Wt 162.9 lb

## 2017-07-16 DIAGNOSIS — D472 Monoclonal gammopathy: Secondary | ICD-10-CM

## 2017-07-16 DIAGNOSIS — Z7982 Long term (current) use of aspirin: Secondary | ICD-10-CM

## 2017-07-16 DIAGNOSIS — I10 Essential (primary) hypertension: Secondary | ICD-10-CM

## 2017-07-16 DIAGNOSIS — Z79899 Other long term (current) drug therapy: Secondary | ICD-10-CM | POA: Diagnosis not present

## 2017-07-16 DIAGNOSIS — E1140 Type 2 diabetes mellitus with diabetic neuropathy, unspecified: Secondary | ICD-10-CM

## 2017-07-16 DIAGNOSIS — N182 Chronic kidney disease, stage 2 (mild): Secondary | ICD-10-CM | POA: Diagnosis not present

## 2017-07-16 NOTE — Progress Notes (Signed)
Patient denies any concerns today.  

## 2017-07-20 DIAGNOSIS — Z0100 Encounter for examination of eyes and vision without abnormal findings: Secondary | ICD-10-CM | POA: Diagnosis not present

## 2017-07-23 ENCOUNTER — Other Ambulatory Visit: Payer: Self-pay | Admitting: Unknown Physician Specialty

## 2017-08-24 ENCOUNTER — Other Ambulatory Visit: Payer: Self-pay | Admitting: Unknown Physician Specialty

## 2017-10-04 ENCOUNTER — Encounter: Payer: Self-pay | Admitting: Unknown Physician Specialty

## 2017-10-10 ENCOUNTER — Telehealth: Payer: Self-pay | Admitting: Unknown Physician Specialty

## 2017-10-10 NOTE — Telephone Encounter (Signed)
Copied from [place] [id number]. Topic: Quick Communication - Rx Refill/Question >> Oct 10, 2017  3:36 PM Judyann Munson wrote: Medication: simvastatin (ZOCOR) 40 MG tablet metoprolol succinate (TOPROL-XL) 25 MG 24 hr tablet amLODipine (NORVASC) 5 MG tablet allopurinol (ZYLOPRIM) 300 MG tablet  Has the patient contacted their pharmacy? No   Preferred Pharmacy (with phone number or street name): [id number] - [place], [address] [phone] (Phone) [phone] (Fax)      Agent: Please be advised that RX refills may take up to 3 business days. We ask that you follow-up with your pharmacy.

## 2017-10-11 MED ORDER — ALLOPURINOL 300 MG PO TABS: 300.0000 mg | ORAL_TABLET | Freq: Every day | ORAL | 0 refills | Status: DC

## 2017-10-11 MED ORDER — AMLODIPINE BESYLATE 5 MG PO TABS: 5.0000 mg | ORAL_TABLET | Freq: Every day | ORAL | 0 refills | Status: DC

## 2017-10-11 MED ORDER — SIMVASTATIN 40 MG PO TABS: 40.0000 mg | ORAL_TABLET | Freq: Every day | ORAL | 2 refills | Status: DC

## 2017-10-11 MED ORDER — METOPROLOL SUCCINATE ER 25 MG PO TB24: 25.0000 mg | ORAL_TABLET | Freq: Every day | ORAL | 0 refills | Status: DC

## 2017-10-17 ENCOUNTER — Other Ambulatory Visit: Payer: Self-pay | Admitting: Unknown Physician Specialty

## 2017-10-24 ENCOUNTER — Encounter: Payer: Self-pay | Admitting: Unknown Physician Specialty

## 2017-11-04 ENCOUNTER — Other Ambulatory Visit: Payer: Self-pay | Admitting: Unknown Physician Specialty

## 2017-11-08 ENCOUNTER — Encounter: Payer: Medicare HMO | Admitting: Unknown Physician Specialty

## 2017-11-13 ENCOUNTER — Encounter: Payer: Self-pay | Admitting: Physician Assistant

## 2017-11-13 ENCOUNTER — Ambulatory Visit (INDEPENDENT_AMBULATORY_CARE_PROVIDER_SITE_OTHER): Payer: Medicare HMO | Admitting: Physician Assistant

## 2017-11-13 VITALS — BP 135/84 | HR 94 | Temp 97.5°F | Ht 58.5 in | Wt 164.8 lb

## 2017-11-13 DIAGNOSIS — N182 Chronic kidney disease, stage 2 (mild): Secondary | ICD-10-CM | POA: Diagnosis not present

## 2017-11-13 DIAGNOSIS — Z794 Long term (current) use of insulin: Secondary | ICD-10-CM | POA: Diagnosis not present

## 2017-11-13 DIAGNOSIS — E7800 Pure hypercholesterolemia, unspecified: Secondary | ICD-10-CM | POA: Diagnosis not present

## 2017-11-13 DIAGNOSIS — Z1239 Encounter for other screening for malignant neoplasm of breast: Secondary | ICD-10-CM

## 2017-11-13 DIAGNOSIS — I10 Essential (primary) hypertension: Secondary | ICD-10-CM

## 2017-11-13 DIAGNOSIS — E1122 Type 2 diabetes mellitus with diabetic chronic kidney disease: Secondary | ICD-10-CM

## 2017-11-13 DIAGNOSIS — Z0000 Encounter for general adult medical examination without abnormal findings: Secondary | ICD-10-CM | POA: Diagnosis not present

## 2017-11-13 DIAGNOSIS — Z1231 Encounter for screening mammogram for malignant neoplasm of breast: Secondary | ICD-10-CM | POA: Diagnosis not present

## 2017-11-13 DIAGNOSIS — F324 Major depressive disorder, single episode, in partial remission: Secondary | ICD-10-CM

## 2017-11-13 DIAGNOSIS — R69 Illness, unspecified: Secondary | ICD-10-CM | POA: Diagnosis not present

## 2017-11-13 DIAGNOSIS — Z23 Encounter for immunization: Secondary | ICD-10-CM | POA: Diagnosis not present

## 2017-11-13 NOTE — Progress Notes (Signed)
Subjective:    Patient ID: Kara Leach, female    DOB: [birth year], 67 y.o.   MRN: [id number]  Kara Leach is a 67 y.o. female presenting on 11/13/2017 for Annual Exam   HPI   PAP: not indicated, s/p hysterectomy and does not have cervix Mammogram: last mammogram 06/2016 and normal; one aunt w/ breast cancer in her 75's Colonoscopy: 2017 no polyps Tetanus: 2012  DM II: Levemir 60 units nightly, 16 units novolog three times daily before meals Dm: 16 units novolog three times before meals, victoza 1.2 mg subq daily, intolerant to metformin, has taken [place] in the past but orals have not worked well to control her blood sugar.  Low blood sugas - "not too bad" - average blood sugar 143.   HTN: amlodipine 5 mg daily. Enalapril 2.5 mg daily.   BP Readings from Last 3 Encounters:  11/13/17 135/84  07/16/17 133/72  05/09/17 126/79   HLD: Takes simvastatin 40 mg daily. No myalgias, other issues.   Lipid Panel     Component Value Date/Time   CHOL 136 10/31/2016 1006   CHOL WILL FOLLOW 05/11/2015 0951   CHOL 242 (H) 11/12/2013 0745   TRIG 263 (H) 10/31/2016 1006   TRIG WILL FOLLOW 05/11/2015 0951   TRIG 1,562 (H) 11/12/2013 0745   HDL 41 10/31/2016 1006   HDL 22 (L) 11/12/2013 0745   VLDL WILL FOLLOW 05/11/2015 0951   VLDL SEE COMMENT 11/12/2013 0745   LDLCALC 42 10/31/2016 1006   LDLCALC SEE COMMENT 11/12/2013 0745    Depression: 100 mg zoloft daily, tolerating well. This does well for her mood. No SI/HI.    Social History   Tobacco Use  . Smoking status: Never Smoker  . Smokeless tobacco: Never Used  Substance Use Topics  . Alcohol use: Yes    Alcohol/week: 0.0 standard drinks    Comment: occasionally holiday, weddings  . Drug use: No    Review of Systems Per HPI unless specifically indicated above     Objective:    BP 135/84 (BP Location: Left Arm, Cuff Size: Normal)   Pulse 94   Temp (!) 97.5 F (36.4 C) (Oral)   Ht 4' 10.5" (1.486 m)   Wt  164 lb 12.8 oz (74.8 kg)   LMP  (LMP Unknown)   SpO2 96%   BMI 33.86 kg/m   Wt Readings from Last 3 Encounters:  11/13/17 164 lb 12.8 oz (74.8 kg)  07/16/17 162 lb 14.4 oz (73.9 kg)  05/09/17 162 lb 14.4 oz (73.9 kg)    Physical Exam  Constitutional: She is oriented to person, place, and time. She appears well-developed and well-nourished.  HENT:  Right Ear: External ear normal.  Left Ear: External ear normal.  Cardiovascular: Normal rate and regular rhythm.  Pulmonary/Chest: Effort normal and breath sounds normal.  Abdominal: Soft. Bowel sounds are normal.  Neurological: She is alert and oriented to person, place, and time.  Skin: Skin is warm and dry.  Psychiatric: She has a normal mood and affect. Her behavior is normal.   Results for orders placed or performed in visit on 07/09/17  Protein electrophoresis, serum  Result Value Ref Range   Total Protein ELP 7.7 6.0 - 8.5 g/dL   Albumin ELP 3.8 2.9 - 4.4 g/dL   Alpha-1-Globulin 0.3 0.0 - 0.4 g/dL   Alpha-2-Globulin 1.1 (H) 0.4 - 1.0 g/dL   Beta Globulin 1.1 0.7 - 1.3 g/dL   Gamma Globulin 1.4 0.4 - 1.8  g/dL   M-Spike, % 0.8 (H) Not Observed g/dL   SPE Interp. Comment    Comment Comment    GLOBULIN, TOTAL 3.9 2.2 - 3.9 g/dL   A/G Ratio 1.0 0.7 - 1.7  Kappa/lambda light chains  Result Value Ref Range   Kappa free light chain 41.5 (H) 3.3 - 19.4 mg/L   Lamda free light chains 18.4 5.7 - 26.3 mg/L   Kappa, lamda light chain ratio 2.26 (H) 0.26 - 9.52  Basic metabolic panel  Result Value Ref Range   Sodium 139 135 - 145 mmol/L   Potassium 4.2 3.5 - 5.1 mmol/L   Chloride 106 101 - 111 mmol/L   CO2 24 22 - 32 mmol/L   Glucose, Bld 142 (H) 65 - 99 mg/dL   BUN 19 6 - 20 mg/dL   Creatinine, Ser 1.12 (H) 0.44 - 1.00 mg/dL   Calcium 9.8 8.9 - 10.3 mg/dL   GFR calc non Af Amer 51 (L) >60 mL/min   GFR calc Af Amer 59 (L) >60 mL/min   Anion gap 9 5 - 15  CBC with Differential/Platelet  Result Value Ref Range   WBC 8.3 3.6 -  11.0 K/uL   RBC 4.01 3.80 - 5.20 MIL/uL   Hemoglobin 13.0 12.0 - 16.0 g/dL   HCT 37.8 35.0 - 47.0 %   MCV 94.3 80.0 - 100.0 fL   MCH 32.4 26.0 - 34.0 pg   MCHC 34.3 32.0 - 36.0 g/dL   RDW 14.2 11.5 - 14.5 %   Platelets 284 150 - 440 K/uL   Neutrophils Relative % 41 %   Neutro Abs 3.4 1.4 - 6.5 K/uL   Lymphocytes Relative 47 %   Lymphs Abs 3.9 (H) 1.0 - 3.6 K/uL   Monocytes Relative 8 %   Monocytes Absolute 0.6 0.2 - 0.9 K/uL   Eosinophils Relative 3 %   Eosinophils Absolute 0.3 0 - 0.7 K/uL   Basophils Relative 1 %   Basophils Absolute 0.1 0 - 0.1 K/uL  Protein Electro, Random Urine  Result Value Ref Range   Total Protein, Urine 16.7 Not Estab. mg/dL   Albumin ELP, Urine 45.6 %   Alpha-1-Globulin, U 2.2 %   Alpha-2-Globulin, U 7.5 %   Beta Globulin, U 22.0 %   Gamma Globulin, U 22.7 %   M Component, Ur Not Observed Not Observed %   Please Note: Comment       Assessment & Plan:   Problem List Items Addressed This Visit      Endocrine   Controlled type 2 diabetes mellitus with chronic kidney disease, with long-term current use of insulin (HCC) - Primary   Relevant Orders   Comp Met (CMET)   Lipid Profile   HgB A1c    Other Visit Diagnoses    Breast cancer screening       Relevant Orders   MM Digital Screening   Flu vaccine need       Relevant Orders   Flu Vaccine QUAD 36+ mos IM (Completed)      No orders of the defined types were placed in this encounter.     Follow up plan: Return in about 3 months (around 02/13/2018) for dm, htn, hld.  Carles Collet, PA-C  [facility] 11/13/2017, 2:50 PM

## 2017-11-13 NOTE — Patient Instructions (Signed)

## 2017-11-14 ENCOUNTER — Other Ambulatory Visit: Payer: Self-pay | Admitting: Unknown Physician Specialty

## 2017-11-14 DIAGNOSIS — E1122 Type 2 diabetes mellitus with diabetic chronic kidney disease: Secondary | ICD-10-CM

## 2017-11-14 DIAGNOSIS — N182 Chronic kidney disease, stage 2 (mild): Principal | ICD-10-CM

## 2017-11-14 DIAGNOSIS — J309 Allergic rhinitis, unspecified: Secondary | ICD-10-CM

## 2017-11-14 DIAGNOSIS — Z794 Long term (current) use of insulin: Secondary | ICD-10-CM

## 2017-11-14 LAB — COMPREHENSIVE METABOLIC PANEL
ALT: 21 IU/L (ref 0–32)
AST: 36 IU/L (ref 0–40)
Albumin/Globulin Ratio: 1.5 (ref 1.2–2.2)
Albumin: 4.6 g/dL (ref 3.6–4.8)
Alkaline Phosphatase: 106 IU/L (ref 39–117)
BUN/Creatinine Ratio: 17 (ref 12–28)
BUN: 17 mg/dL (ref 8–27)
Bilirubin Total: 0.3 mg/dL (ref 0.0–1.2)
CO2: 21 mmol/L (ref 20–29)
Calcium: 10 mg/dL (ref 8.7–10.3)
Chloride: 102 mmol/L (ref 96–106)
Creatinine, Ser: 1.01 mg/dL — ABNORMAL HIGH (ref 0.57–1.00)
GFR calc Af Amer: 68 mL/min/{1.73_m2} (ref >59)
GFR calc non Af Amer: 59 mL/min/{1.73_m2} — ABNORMAL LOW (ref >59)
Globulin, Total: 3 g/dL (ref 1.5–4.5)
Glucose: 114 mg/dL — ABNORMAL HIGH (ref 65–99)
Potassium: 5 mmol/L (ref 3.5–5.2)
Sodium: 139 mmol/L (ref 134–144)
Total Protein: 7.6 g/dL (ref 6.0–8.5)

## 2017-11-14 LAB — HEMOGLOBIN A1C
Est. average glucose Bld gHb Est-mCnc: 151 mg/dL
Hgb A1c MFr Bld: 6.9 % — ABNORMAL HIGH (ref 4.8–5.6)

## 2017-11-14 LAB — LIPID PANEL
Chol/HDL Ratio: 3.9 ratio (ref 0.0–4.4)
Cholesterol, Total: 154 mg/dL (ref 100–199)
HDL: 40 mg/dL (ref >39)
LDL Calculated: 45 mg/dL (ref 0–99)
Triglycerides: 345 mg/dL — ABNORMAL HIGH (ref 0–149)
VLDL Cholesterol Cal: 69 mg/dL — ABNORMAL HIGH (ref 5–40)

## 2017-11-14 MED ORDER — FLUTICASONE PROPIONATE 50 MCG/ACT NA SUSP: 2.0000 | Freq: Every day | NASAL | 0 refills | Status: DC | PRN

## 2017-11-14 MED ORDER — INSULIN DETEMIR 100 UNIT/ML FLEXPEN: PEN_INJECTOR | SUBCUTANEOUS | 0 refills | Status: DC

## 2017-11-14 NOTE — Telephone Encounter (Signed)
Copied from [place] [id number]. Topic: Quick Communication - Rx Refill/Question >> Nov 14, 2017  3:08 PM Yvette Rack wrote: Medication: fluticasone (FLONASE) 50 MCG/ACT 90 day supply nasal spray  LEVEMIR FLEXTOUCH 100 UNIT/ML Pen  90 day supply     Has the patient contacted their pharmacy? No. Pt was in the office yesterday and requested this (Agent: If no, request that the patient contact the pharmacy for the refill.) (Agent: If yes, when and what did the pharmacy advise?)  Preferred Pharmacy (with phone number or street name): [id number] [place], [place] [address] [phone] (Phone) [phone] (Fax)    Agent: Please be advised that RX refills may take up to 3 business days. We ask that you follow-up with your pharmacy.

## 2017-11-14 NOTE — Telephone Encounter (Signed)
Filled

## 2017-12-05 ENCOUNTER — Ambulatory Visit (INDEPENDENT_AMBULATORY_CARE_PROVIDER_SITE_OTHER): Payer: Medicare HMO

## 2017-12-05 ENCOUNTER — Telehealth: Payer: Self-pay | Admitting: Family Medicine

## 2017-12-05 VITALS — BP 124/78 | HR 79 | Temp 98.0°F | Resp 17 | Ht 59.0 in | Wt 163.3 lb

## 2017-12-05 DIAGNOSIS — Z0000 Encounter for general adult medical examination without abnormal findings: Secondary | ICD-10-CM | POA: Diagnosis not present

## 2017-12-05 NOTE — Telephone Encounter (Signed)
Spoke with Zenia Resides at [facility]. Medication list confirmed.

## 2017-12-05 NOTE — Progress Notes (Signed)
Subjective:   Kara Leach is a 67 y.o. female who presents for an Initial Medicare Annual Wellness Visit.  Review of Systems      Cardiac Risk Factors include: hypertension;advanced age (>20men, >81 women);dyslipidemia;diabetes mellitus     Objective:    Today's Vitals   12/05/17 1017 12/05/17 1019  BP: 124/78   Pulse: 79   Resp: 17   Temp: 98 F (36.7 C)   TempSrc: Temporal   SpO2: 97%   Weight: 163 lb 4.8 oz (74.1 kg)   Height: 4\' 11"  (1.499 m)   PainSc:  7    Body mass index is 32.98 kg/m.  Advanced Directives 12/05/2017 01/15/2017 07/12/2016 01/05/2016 12/01/2015 08/12/2015 08/20/2014  Does Patient Have a Medical Advance Directive? Yes Yes No No Yes No Yes  Type of Paramedic of [place];Living will Living will;Healthcare Power of [place];Living will  Does patient want to make changes to medical advance directive? - - - - - - No - Patient declined  Copy of [place] in Chart? No - copy requested - - - No - copy requested - No - copy requested  Would patient like information on creating a medical advance directive? - - No - Patient declined No - patient declined information - No - patient declined information -    Current Medications (verified) Outpatient Encounter Medications as of 12/05/2017  Medication Sig  . acetaminophen (TYLENOL ARTHRITIS PAIN) 650 MG CR tablet Take 650 mg by mouth every 8 (eight) hours as needed for pain.  Marland Kitchen allopurinol (ZYLOPRIM) 300 MG tablet Take 1 tablet (300 mg total) by mouth daily.  Marland Kitchen amLODipine (NORVASC) 5 MG tablet Take 1 tablet (5 mg total) by mouth daily.  Marland Kitchen aspirin EC 81 MG tablet Take 81 mg by mouth daily.  . BD PEN NEEDLE NANO U/F 32G X 4 MM MISC USE EVERY MORNING  . Calcium Carb-Cholecalciferol (CALCIUM 600 + D PO) Take 1 tablet by mouth 2 (two) times daily.  . enalapril (VASOTEC) 2.5 MG tablet TAKE 1 TABLET BY MOUTH DAILY    . fluticasone (FLONASE) 50 MCG/ACT nasal spray Place 2 sprays into both nostrils daily as needed for allergies.  . Insulin Detemir (LEVEMIR FLEXTOUCH) 100 UNIT/ML Pen INJECT 60 UNITS INTO THE SKIN DAILY AT 10 PM.  . metoprolol succinate (TOPROL-XL) 25 MG 24 hr tablet Take 1 tablet (25 mg total) by mouth daily.  Marland Kitchen NOVOLOG FLEXPEN 100 UNIT/ML FlexPen INJECT 15U UNDER THE SKIN 3 TIMES DAILY WITH MEALS  . ranitidine (ZANTAC) 150 MG tablet Take 150 mg by mouth 2 (two) times daily.  . sertraline (ZOLOFT) 100 MG tablet TAKE 1 TABLET (100 MG TOTAL) EVERY MORNING BY MOUTH.  . simvastatin (ZOCOR) 40 MG tablet Take 1 tablet (40 mg total) by mouth at bedtime.  Marland Kitchen Specialty Vitamins Products (VITA-RX DIABETIC VITAMIN) CAPS Take 1 packet by mouth daily. Diabetic Vitamins - 6 or 7 in 1 pack  . VICTOZA 18 MG/3ML SOPN ADMINISTER 1.2MG  UNDER THE SKIN DAILY   No facility-administered encounter medications on file as of 12/05/2017.     Allergies (verified) Ibuprofen and Strawberry extract   History: Past Medical History:  Diagnosis Date  . Allergy   . Arthritis    Osteoarthritis  . Chronic kidney disease   . Depression   . Diabetes mellitus without complication ([place]) 3154  . Diabetic neuropathy (HCC)    feet  .  Diverticulitis 2017  . GERD (gastroesophageal reflux disease)   . Headache    sinus  . Hyperlipidemia   . Hypertension   . Last menstrual period (LMP) > 10 days ago 1986  . Obesity   . Renal insufficiency    Stage 2 kidney disease  . Seizures ([place])    due to high blood sugars. last time = 09/2014  . Sleep apnea    has CPAP, can't tolerate  . Wears hearing aid    right side  . Wrist weakness    left - after fracture and repair   Past Surgical History:  Procedure Laterality Date  . ABDOMINAL HYSTERECTOMY    . APPENDECTOMY    . BREAST BIOPSY Left 07/03/2016   bx done by sankar path pending  . CHOLECYSTECTOMY  2012  . COLONOSCOPY WITH PROPOFOL N/A 12/01/2015   Procedure:  COLONOSCOPY WITH PROPOFOL;  Surgeon: [name], MD;  Location: [facility];  Service: Endoscopy;  Laterality: N/A;  Diabetic - insulin Sleep apnea  . PATELLA FRACTURE SURGERY Left   . WRIST FRACTURE SURGERY Left    Steel plate   Family History  Problem Relation Age of Onset  . Diabetes Mother   . Heart disease Mother   . Hypertension Mother   . Diabetes Sister   . Cancer Father        stomach  . Arthritis Sister   . Stroke Sister   . Hypertension Sister   . Breast cancer Maternal Aunt 27       great aunt   Social History   Socioeconomic History  . Marital status: Widowed    Spouse name: Not on file  . Number of children: Not on file  . Years of education: Not on file  . Highest education level: Not on file  Occupational History  . Not on file  Social Needs  . Financial resource strain: Not very hard  . Food insecurity:    Worry: Never true    Inability: Never true  . Transportation needs:    Medical: No    Non-medical: No  Tobacco Use  . Smoking status: Never Smoker  . Smokeless tobacco: Never Used  Substance and Sexual Activity  . Alcohol use: Not Currently    Alcohol/week: 0.0 standard drinks    Comment: occasionally holiday, weddings  . Drug use: No  . Sexual activity: Never  Lifestyle  . Physical activity:    Days per week: 0 days    Minutes per session: 0 min  . Stress: Not at all  Relationships  . Social connections:    Talks on phone: More than three times a week    Gets together: More than three times a week    Attends religious service: More than 4 times per year    Active member of club or organization: No    Attends meetings of clubs or organizations: Never    Relationship status: Widowed  Other Topics Concern  . Not on file  Social History Narrative  . Not on file    Tobacco Counseling Counseling given: Not Answered   Clinical Intake:  Pre-visit preparation completed: Yes  Pain : 0-10 Pain Score: 7  Pain Type: Chronic  pain Pain Location: Back Pain Orientation: Left Pain Descriptors / Indicators: Aching Pain Onset: More than a month ago Pain Frequency: Constant     Nutritional Status: BMI > 30  Obese Nutritional Risks: None Diabetes: No  How often do you need to have someone  help you when you read instructions, pamphlets, or other written materials from your doctor or pharmacy?: 1 - Never What is the last grade level you completed in school?: high school   Interpreter Needed?: No  Information entered by :: Tiffany Hill,LPN    Activities of Daily Living In your present state of health, do you have any difficulty performing the following activities: 12/05/2017 11/13/2017  Hearing? Y Y  Comment hearing aids  -  Vision? N N  Difficulty concentrating or making decisions? Y Y  Comment - -  Walking or climbing stairs? Y Y  Comment knee pain  -  Dressing or bathing? N N  Doing errands, shopping? N N  Preparing Food and eating ? N -  Using the Toilet? N -  In the past six months, have you accidently leaked urine? Y -  Comment wears protection  -  Do you have problems with loss of bowel control? N -  Managing your Medications? N -  Managing your Finances? N -  Housekeeping or managing your Housekeeping? N -  Some recent data might be hidden     Immunizations and Health Maintenance Immunization History  Administered Date(s) Administered  . Influenza,inj,Quad PF,6+ Mos 11/01/2016, 11/13/2017  . Influenza-Unspecified 12/20/2014, 12/28/2015  . Pneumococcal Polysaccharide-23 02/14/2009  . Td 06/24/2008  . Tdap 01/08/2011  . Zoster 12/28/2015   There are no preventive care reminders to display for this patient.  Patient Care Team: Kathrine Haddock, NP as PCP - General (Nurse Practitioner) Bary Castilla Forest Gleason, MD (General Surgery) Anthonette Legato, MD (Internal Medicine) Leanor Kail, MD (Orthopedic Surgery) Lloyd Huger, MD as Consulting Physician (Oncology)  Indicate any recent  Medical Services you may have received from other than [facility] providers in the past year (date may be approximate).     Assessment:   This is a routine wellness examination for [place].  Hearing/Vision screen Vision Screening Comments: [facility]es [facility]  Dietary issues and exercise activities discussed: Current Exercise Habits: Home exercise routine, Type of exercise: walking, Time (Minutes): 20, Frequency (Times/Week): 7, Weekly Exercise (Minutes/Week): 140, Intensity: Mild, Exercise limited by: None identified  Goals    . DIET - INCREASE WATER INTAKE     Recommend continue drinking at least 6-8 glasses of water a day       Depression Screen PHQ 2/9 Scores 12/05/2017 11/13/2017 05/09/2017 02/04/2017 10/31/2016 05/02/2016 08/20/2014  PHQ - 2 Score 4 2 2 3 2 4  0  PHQ- 9 Score 10 7 6 14 7 12  -    Fall Risk Fall Risk  12/05/2017 11/13/2017 02/04/2017 10/14/2014 08/20/2014  Falls in the past year? No No No Yes Yes  Comment - - - no falls in last week -  Number falls in past yr: - - - - 1  Injury with Fall? - - - - Yes  Comment - - - - twisted right iwrist and left knee; saw PCP  Risk Factor Category  - - - - High Fall Risk  Risk for fall due to : - - - - History of fall(s);Impaired balance/gait  Follow up - - - - Education provided;Falls prevention discussed  Comment - - - - Has Med Alert necklace    Is the patient's home free of loose throw rugs in walkways, pet beds, electrical cords, etc?   yes      Grab bars in the bathroom? no      Handrails on the stairs?   no stairs  Adequate lighting?   yes  Timed Get Up and Go Performed Completed in 8 seconds with no use of assistive devices, steady gait. No intervention needed at this time.   Cognitive Function:     6CIT Screen 12/05/2017  What Year? 0 points  What month? 0 points  What time? 0 points  Count back from 20 0 points  Months in reverse 0 points  Repeat phrase 0 points  Total Score 0    Screening  Tests Health Maintenance  Topic Date Due  . OPHTHALMOLOGY EXAM  05/08/2018  . FOOT EXAM  05/09/2018  . HEMOGLOBIN A1C  05/16/2018  . MAMMOGRAM  06/19/2018  . TETANUS/TDAP  01/07/2021  . COLONOSCOPY  11/30/2025  . INFLUENZA VACCINE  Completed  . Hepatitis C Screening  Completed  . HIV Screening  Completed    TDAP: up to date  Pneumococcal: not indicated currently - will receive prevnar 81 in November  Influenza: up to date   Qualifies for Shingles Vaccine?  Yes, discussed shingrix vaccine   Cancer Screenings: Lung: Low Dose CT Chest recommended if Age 64-80 years, 30 pack-year currently smoking OR have quit w/in 15years. Patient does not qualify. Breast: Up to date on Mammogram? Yes  06/18/2016 Up to date of Bone Density/Dexa? Not indicated Colorectal: completed 12/01/2015  Additional Screenings:  Hepatitis C Screening: completed 08/12/2015     Plan:    I have personally reviewed and addressed the Medicare Annual Wellness questionnaire and have noted the following in the patient's chart:  A. Medical and social history B. Use of alcohol, tobacco or illicit drugs  C. Current medications and supplements D. Functional ability and status E.  Nutritional status F.  Physical activity G. Advance directives H. List of other physicians I.  Hospitalizations, surgeries, and ER visits in previous 12 months J.  [place] such as hearing and vision if needed, cognitive and depression L. Referrals and appointments   In addition, I have reviewed and discussed with patient certain preventive protocols, quality metrics, and best practice recommendations. A written personalized care plan for preventive services as well as general preventive health recommendations were provided to patient.   Signed,  Tyler Aas, LPN Nurse Health Advisor   Nurse Notes:none

## 2017-12-05 NOTE — Patient Instructions (Signed)
Kara Leach , Thank you for taking time to come for your Medicare Wellness Visit. I appreciate your ongoing commitment to your health goals. Please review the following plan we discussed and let me know if I can assist you in the future.   Screening recommendations/referrals: Colonoscopy: completed 12/01/2015 Mammogram: completed 06/18/2016 Please call [phone] to schedule your mammogram.  Bone Density: not indicated  Recommended yearly ophthalmology/optometry visit for glaucoma screening and checkup Recommended yearly dental visit for hygiene and checkup  Vaccinations: Influenza vaccine: completed  Pneumococcal vaccine: prevnar 13 due at age 51  Tdap vaccine: up to date  Shingles vaccine: shingrix eligible, check with your insurance company for coverage   Advanced directives: Please bring a copy of your health care power of attorney and living will to the office at your convenience.  Conditions/risks identified: Recommend continue drinking at least 6-8 glasses of water a day   Next appointment: Follow up on 02/12/2018 at 9:00am with Jolene Cannady,NP. Follow up in one year for your annual wellness exam.   Preventive Care 40-64 Years, Female Preventive care refers to lifestyle choices and visits with your health care provider that can promote health and wellness. What does preventive care include?  A yearly physical exam. This is also called an annual well check.  Dental exams once or twice a year.  Routine eye exams. Ask your health care provider how often you should have your eyes checked.  Personal lifestyle choices, including:  Daily care of your teeth and gums.  Regular physical activity.  Eating a healthy diet.  Avoiding tobacco and drug use.  Limiting alcohol use.  Practicing safe sex.  Taking low-dose aspirin daily starting at age 52.  Taking vitamin and mineral supplements as recommended by your health care provider. What happens during an annual well  check? The services and screenings done by your health care provider during your annual well check will depend on your age, overall health, lifestyle risk factors, and family history of disease. Counseling  Your health care provider may ask you questions about your:  Alcohol use.  Tobacco use.  Drug use.  Emotional well-being.  Home and relationship well-being.  Sexual activity.  Eating habits.  Work and work Statistician.  Method of birth control.  Menstrual cycle.  Pregnancy history. Screening  You may have the following tests or measurements:  Height, weight, and BMI.  Blood pressure.  Lipid and cholesterol levels. These may be checked every 5 years, or more frequently if you are over 5 years old.  Skin check.  Lung cancer screening. You may have this screening every year starting at age 80 if you have a 30-pack-year history of smoking and currently smoke or have quit within the past 15 years.  Fecal occult blood test (FOBT) of the stool. You may have this test every year starting at age 48.  Flexible sigmoidoscopy or colonoscopy. You may have a sigmoidoscopy every 5 years or a colonoscopy every 10 years starting at age 53.  Hepatitis C blood test.  Hepatitis B blood test.  Sexually transmitted disease (STD) testing.  Diabetes screening. This is done by checking your blood sugar (glucose) after you have not eaten for a while (fasting). You may have this done every 1-3 years.  Mammogram. This may be done every 1-2 years. Talk to your health care provider about when you should start having regular mammograms. This may depend on whether you have a family history of breast cancer.  BRCA-related cancer screening. This may be  done if you have a family history of breast, ovarian, tubal, or peritoneal cancers.  Pelvic exam and Pap test. This may be done every 3 years starting at age 29. Starting at age 68, this may be done every 5 years if you have a Pap test in  combination with an HPV test.  Bone density scan. This is done to screen for osteoporosis. You may have this scan if you are at high risk for osteoporosis. Discuss your test results, treatment options, and if necessary, the need for more tests with your health care provider. Vaccines  Your health care provider may recommend certain vaccines, such as:  Influenza vaccine. This is recommended every year.  Tetanus, diphtheria, and acellular pertussis (Tdap, Td) vaccine. You may need a Td booster every 10 years.  Zoster vaccine. You may need this after age 15.  Pneumococcal 13-valent conjugate (PCV13) vaccine. You may need this if you have certain conditions and were not previously vaccinated.  Pneumococcal polysaccharide (PPSV23) vaccine. You may need one or two doses if you smoke cigarettes or if you have certain conditions. Talk to your health care provider about which screenings and vaccines you need and how often you need them. This information is not intended to replace advice given to you by your health care provider. Make sure you discuss any questions you have with your health care provider. Document Released: 04/01/2015 Document Revised: 11/23/2015 Document Reviewed: 01/04/2015 Elsevier Interactive Patient Education  2017 [place] Prevention in the Home Falls can cause injuries. They can happen to people of all ages. There are many things you can do to make your home safe and to help prevent falls. What can I do on the outside of my home?  Regularly fix the edges of walkways and driveways and fix any cracks.  Remove anything that might make you trip as you walk through a door, such as a raised step or threshold.  Trim any bushes or trees on the path to your home.  Use bright outdoor lighting.  Clear any walking paths of anything that might make someone trip, such as rocks or tools.  Regularly check to see if handrails are loose or broken. Make sure that both  sides of any steps have handrails.  Any raised decks and porches should have guardrails on the edges.  Have any leaves, snow, or ice cleared regularly.  Use sand or salt on walking paths during winter.  Clean up any spills in your garage right away. This includes oil or grease spills. What can I do in the bathroom?  Use night lights.  Install grab bars by the toilet and in the tub and shower. Do not use towel bars as grab bars.  Use non-skid mats or decals in the tub or shower.  If you need to sit down in the shower, use a plastic, non-slip stool.  Keep the floor dry. Clean up any water that spills on the floor as soon as it happens.  Remove soap buildup in the tub or shower regularly.  Attach bath mats securely with double-sided non-slip rug tape.  Do not have throw rugs and other things on the floor that can make you trip. What can I do in the bedroom?  Use night lights.  Make sure that you have a light by your bed that is easy to reach.  Do not use any sheets or blankets that are too big for your bed. They should not hang down onto the  floor.  Have a firm chair that has side arms. You can use this for support while you get dressed.  Do not have throw rugs and other things on the floor that can make you trip. What can I do in the kitchen?  Clean up any spills right away.  Avoid walking on wet floors.  Keep items that you use a lot in easy-to-reach places.  If you need to reach something above you, use a strong step stool that has a grab bar.  Keep electrical cords out of the way.  Do not use floor polish or wax that makes floors slippery. If you must use wax, use non-skid floor wax.  Do not have throw rugs and other things on the floor that can make you trip. What can I do with my stairs?  Do not leave any items on the stairs.  Make sure that there are handrails on both sides of the stairs and use them. Fix handrails that are broken or loose. Make sure that  handrails are as long as the stairways.  Check any carpeting to make sure that it is firmly attached to the stairs. Fix any carpet that is loose or worn.  Avoid having throw rugs at the top or bottom of the stairs. If you do have throw rugs, attach them to the floor with carpet tape.  Make sure that you have a light switch at the top of the stairs and the bottom of the stairs. If you do not have them, ask someone to add them for you. What else can I do to help prevent falls?  Wear shoes that:  Do not have high heels.  Have rubber bottoms.  Are comfortable and fit you well.  Are closed at the toe. Do not wear sandals.  If you use a stepladder:  Make sure that it is fully opened. Do not climb a closed stepladder.  Make sure that both sides of the stepladder are locked into place.  Ask someone to hold it for you, if possible.  Clearly mark and make sure that you can see:  Any grab bars or handrails.  First and last steps.  Where the edge of each step is.  Use tools that help you move around (mobility aids) if they are needed. These include:  Canes.  Walkers.  Scooters.  Crutches.  Turn on the lights when you go into a dark area. Replace any light bulbs as soon as they burn out.  Set up your furniture so you have a clear path. Avoid moving your furniture around.  If any of your floors are uneven, fix them.  If there are any pets around you, be aware of where they are.  Review your medicines with your doctor. Some medicines can make you feel dizzy. This can increase your chance of falling. Ask your doctor what other things that you can do to help prevent falls. This information is not intended to replace advice given to you by your health care provider. Make sure you discuss any questions you have with your health care provider. Document Released: 12/30/2008 Document Revised: 08/11/2015 Document Reviewed: 04/09/2014 Elsevier Interactive Patient Education  2017  [employer].

## 2017-12-05 NOTE — Telephone Encounter (Signed)
Copied from [place] [id number]. Topic: Quick Communication - See Telephone Encounter >> Dec 05, 2017 10:44 AM Antonieta Iba C wrote: CRM for notification. See Telephone encounter for: 12/05/17.  Pt's insurance Scientist, clinical (histocompatibility and immunogenetics)) called in for medication management. Requesting to speak with RN or MA. [phone]

## 2017-12-05 NOTE — Telephone Encounter (Signed)
Not sure what they need- can you call please?

## 2017-12-08 ENCOUNTER — Other Ambulatory Visit: Payer: Self-pay | Admitting: Physician Assistant

## 2017-12-08 DIAGNOSIS — J309 Allergic rhinitis, unspecified: Secondary | ICD-10-CM

## 2017-12-09 ENCOUNTER — Telehealth: Payer: Self-pay | Admitting: Unknown Physician Specialty

## 2017-12-09 MED ORDER — LIRAGLUTIDE 18 MG/3ML ~~LOC~~ SOPN: PEN_INJECTOR | SUBCUTANEOUS | 12 refills | Status: DC

## 2017-12-09 NOTE — Telephone Encounter (Signed)
Copied from [place] [id number]. Topic: Quick Communication - See Telephone Encounter >> Dec 09, 2017  2:03 PM Hewitt Shorts wrote: [phone]  Pt is calling to check the status of a refill request for the victoza  She states that she called this in already but nothing in chart  [facility]   Best number [phone]

## 2017-12-28 ENCOUNTER — Other Ambulatory Visit: Payer: Self-pay | Admitting: Physician Assistant

## 2017-12-28 DIAGNOSIS — E1122 Type 2 diabetes mellitus with diabetic chronic kidney disease: Secondary | ICD-10-CM

## 2017-12-28 DIAGNOSIS — N182 Chronic kidney disease, stage 2 (mild): Principal | ICD-10-CM

## 2017-12-28 DIAGNOSIS — Z794 Long term (current) use of insulin: Secondary | ICD-10-CM

## 2017-12-30 NOTE — Telephone Encounter (Signed)
Refill on Levemir sent to pharmacy per request.

## 2017-12-31 ENCOUNTER — Other Ambulatory Visit: Payer: Self-pay | Admitting: Unknown Physician Specialty

## 2017-12-31 DIAGNOSIS — R69 Illness, unspecified: Secondary | ICD-10-CM | POA: Diagnosis not present

## 2017-12-31 NOTE — Telephone Encounter (Signed)
Requested medication (s) are due for refill today -yes  Requested medication (s) are on the active medication list -yes  Future visit scheduled -yes  Notes to clinic: Rx is clear for refill- needs new provider on Rx- patient has future appointment with J Cannady,DNP  Requested Prescriptions  Pending Prescriptions Disp Refills   ONETOUCH VERIO test strip [Pharmacy Med Name: ONE TOUCH VERIO TEST STRIP]  4    Sig: USE TO [place]     Endocrinology: Diabetes - Testing Supplies Passed - 12/31/2017 11:57 AM      Passed - Valid encounter within last 12 months    Recent Outpatient Visits          1 month ago Annual physical exam   [facility] Terrilee Croak, Adriana M, PA-C   7 months ago Controlled type 2 diabetes mellitus with stage 2 chronic kidney disease, with long-term current use of insulin ([place])   [facility] Kathrine Haddock, NP   11 months ago Controlled type 2 diabetes mellitus with stage 2 chronic kidney disease, with long-term current use of insulin ([place])   [facility] Kathrine Haddock, NP   1 year ago CKD (chronic kidney disease) stage 2, GFR 60-89 ml/min   [facility] Kathrine Haddock, NP   1 year ago Pure hypercholesterolemia   [facility] Kathrine Haddock, NP      Future Appointments            In 1 month Cannady, Barbaraann Faster, NP [facility]   In 81 months  [facility]

## 2018-01-08 ENCOUNTER — Inpatient Hospital Stay: Payer: Medicare HMO | Attending: Oncology

## 2018-01-08 DIAGNOSIS — D472 Monoclonal gammopathy: Secondary | ICD-10-CM | POA: Diagnosis present

## 2018-01-08 DIAGNOSIS — Z794 Long term (current) use of insulin: Secondary | ICD-10-CM | POA: Insufficient documentation

## 2018-01-08 DIAGNOSIS — N189 Chronic kidney disease, unspecified: Secondary | ICD-10-CM | POA: Diagnosis not present

## 2018-01-08 DIAGNOSIS — E1142 Type 2 diabetes mellitus with diabetic polyneuropathy: Secondary | ICD-10-CM | POA: Diagnosis not present

## 2018-01-08 DIAGNOSIS — Z7982 Long term (current) use of aspirin: Secondary | ICD-10-CM | POA: Insufficient documentation

## 2018-01-08 DIAGNOSIS — Z79899 Other long term (current) drug therapy: Secondary | ICD-10-CM | POA: Insufficient documentation

## 2018-01-08 DIAGNOSIS — E1165 Type 2 diabetes mellitus with hyperglycemia: Secondary | ICD-10-CM | POA: Diagnosis not present

## 2018-01-08 LAB — CBC WITH DIFFERENTIAL/PLATELET
Abs Immature Granulocytes: 0.04 10*3/uL (ref 0.00–0.07)
Basophils Absolute: 0.1 10*3/uL (ref 0.0–0.1)
Basophils Relative: 1 %
EOS ABS: 0.3 10*3/uL (ref 0.0–0.5)
Eosinophils Relative: 3 %
HEMATOCRIT: 37.6 % (ref 36.0–46.0)
Hemoglobin: 12 g/dL (ref 12.0–15.0)
Immature Granulocytes: 0 %
LYMPHS ABS: 3.2 10*3/uL (ref 0.7–4.0)
Lymphocytes Relative: 34 %
MCH: 29.9 pg (ref 26.0–34.0)
MCHC: 31.9 g/dL (ref 30.0–36.0)
MCV: 93.8 fL (ref 80.0–100.0)
MONO ABS: 0.7 10*3/uL (ref 0.1–1.0)
MONOS PCT: 7 %
Neutro Abs: 5.2 10*3/uL (ref 1.7–7.7)
Neutrophils Relative %: 55 %
PLATELETS: 279 10*3/uL (ref 150–400)
RBC: 4.01 MIL/uL (ref 3.87–5.11)
RDW: 13.5 % (ref 11.5–15.5)
WBC: 9.5 10*3/uL (ref 4.0–10.5)
nRBC: 0 % (ref 0.0–0.2)

## 2018-01-08 LAB — BASIC METABOLIC PANEL
ANION GAP: 8 (ref 5–15)
BUN: 20 mg/dL (ref 8–23)
CALCIUM: 9.7 mg/dL (ref 8.9–10.3)
CHLORIDE: 107 mmol/L (ref 98–111)
CO2: 23 mmol/L (ref 22–32)
CREATININE: 1.14 mg/dL — AB (ref 0.44–1.00)
GFR calc Af Amer: 58 mL/min — ABNORMAL LOW (ref >60)
GFR calc non Af Amer: 50 mL/min — ABNORMAL LOW (ref >60)
Glucose, Bld: 149 mg/dL — ABNORMAL HIGH (ref 70–99)
POTASSIUM: 4.8 mmol/L (ref 3.5–5.1)
SODIUM: 138 mmol/L (ref 135–145)

## 2018-01-09 LAB — KAPPA/LAMBDA LIGHT CHAINS
KAPPA FREE LGHT CHN: 43.1 mg/L — AB (ref 3.3–19.4)
Kappa, lambda light chain ratio: 2.08 — ABNORMAL HIGH (ref 0.26–1.65)
Lambda free light chains: 20.7 mg/L (ref 5.7–26.3)

## 2018-01-10 LAB — PROTEIN ELECTROPHORESIS, SERUM
A/G RATIO SPE: 1.1 (ref 0.7–1.7)
Albumin ELP: 3.9 g/dL (ref 2.9–4.4)
Alpha-1-Globulin: 0.3 g/dL (ref 0.0–0.4)
Alpha-2-Globulin: 1 g/dL (ref 0.4–1.0)
BETA GLOBULIN: 1 g/dL (ref 0.7–1.3)
GLOBULIN, TOTAL: 3.5 g/dL (ref 2.2–3.9)
Gamma Globulin: 1.3 g/dL (ref 0.4–1.8)
M-Spike, %: 0.8 g/dL — ABNORMAL HIGH
TOTAL PROTEIN ELP: 7.4 g/dL (ref 6.0–8.5)

## 2018-01-10 LAB — PROTEIN ELECTRO, RANDOM URINE
ALPHA-1-GLOBULIN, U: 0.6 %
ALPHA-2-GLOBULIN, U: 6 %
Albumin ELP, Urine: 40.1 %
BETA GLOBULIN, U: 28.1 %
Gamma Globulin, U: 25.2 %
Total Protein, Urine: 19.3 mg/dL

## 2018-01-13 ENCOUNTER — Other Ambulatory Visit: Payer: Self-pay | Admitting: Unknown Physician Specialty

## 2018-01-13 NOTE — Progress Notes (Signed)
[place]  [phone] [phone]) [phone]  ID: CAROLENA FAIRBANK OB: 1950-09-30  MR#: [id number]  JGG#:[id number]  Patient Care Team: Kathrine Haddock, NP as PCP - General (Nurse Practitioner) Bary Castilla Forest Gleason, MD (General Surgery) Anthonette Legato, MD (Internal Medicine) Leanor Kail, MD (Orthopedic Surgery) Lloyd Huger, MD as Consulting Physician (Oncology)  CHIEF COMPLAINT: MGUS  INTERVAL HISTORY: Patient returns to clinic today for repeat laboratory work and routine six-month follow-up.  She continues to feel well and remains asymptomatic.  She has no neurologic complaints.  She denies any recent fevers or illnesses.  She has a good appetite and denies weight loss.  She denies any pain.  She has no chest pain or shortness of breath.  She denies any nausea, vomiting, constipation, or diarrhea.  She has no urinary complaints.  Patient feels at her baseline offers no specific complaints today.  REVIEW OF SYSTEMS:   Review of Systems  Constitutional: Negative.  Negative for fever, malaise/fatigue and weight loss.  Respiratory: Negative.  Negative for shortness of breath.   Cardiovascular: Negative.  Negative for chest pain and leg swelling.  Gastrointestinal: Negative.  Negative for abdominal pain.  Genitourinary: Negative.  Negative for dysuria.  Musculoskeletal: Negative.  Negative for back pain.  Skin: Negative.  Negative for rash.  Neurological: Negative.  Negative for sensory change, focal weakness, weakness and headaches.  Psychiatric/Behavioral: Negative.  The patient is not nervous/anxious.     As per HPI. Otherwise, a complete review of systems is negative.  PAST MEDICAL HISTORY: Past Medical History:  Diagnosis Date  . Allergy   . Arthritis    Osteoarthritis  . Chronic kidney disease   . Depression   . Diabetes mellitus without complication ([place]) 5465  . Diabetic neuropathy (HCC)    feet  . Diverticulitis 2017  . GERD  (gastroesophageal reflux disease)   . Headache    sinus  . Hyperlipidemia   . Hypertension   . Last menstrual period (LMP) > 10 days ago 1986  . Obesity   . Renal insufficiency    Stage 2 kidney disease  . Seizures ([place])    due to high blood sugars. last time = 09/2014  . Sleep apnea    has CPAP, can't tolerate  . Wears hearing aid    right side  . Wrist weakness    left - after fracture and repair    PAST SURGICAL HISTORY: Past Surgical History:  Procedure Laterality Date  . ABDOMINAL HYSTERECTOMY    . APPENDECTOMY    . BREAST BIOPSY Left 07/03/2016   bx done by sankar path pending  . CHOLECYSTECTOMY  2012  . COLONOSCOPY WITH PROPOFOL N/A 12/01/2015   Procedure: COLONOSCOPY WITH PROPOFOL;  Surgeon: [name], MD;  Location: [facility];  Service: Endoscopy;  Laterality: N/A;  Diabetic - insulin Sleep apnea  . PATELLA FRACTURE SURGERY Left   . WRIST FRACTURE SURGERY Left    Steel plate    FAMILY HISTORY Family History  Problem Relation Age of Onset  . Diabetes Mother   . Heart disease Mother   . Hypertension Mother   . Diabetes Sister   . Cancer Father        stomach  . Arthritis Sister   . Stroke Sister   . Hypertension Sister   . Breast cancer Maternal Aunt 34       great aunt       ADVANCED DIRECTIVES:    HEALTH MAINTENANCE: Social History  Tobacco Use  . Smoking status: Never Smoker  . Smokeless tobacco: Never Used  Substance Use Topics  . Alcohol use: Not Currently    Alcohol/week: 0.0 standard drinks    Comment: occasionally holiday, weddings  . Drug use: No     Colonoscopy:  PAP:  Bone density:  Lipid panel:  Allergies  Allergen Reactions  . Ibuprofen Other (See Comments)    "ringing in ears"  . Strawberry Extract Swelling    Lips swell and rash over body    Current Outpatient Medications  Medication Sig Dispense Refill  . acetaminophen (TYLENOL ARTHRITIS PAIN) 650 MG CR tablet Take 650 mg by mouth every 8 (eight)  hours as needed for pain.    Marland Kitchen allopurinol (ZYLOPRIM) 300 MG tablet Take 1 tablet (300 mg total) by mouth daily. 90 tablet 0  . amLODipine (NORVASC) 5 MG tablet TAKE 1 TABLET BY MOUTH EVERY DAY 90 tablet 0  . aspirin EC 81 MG tablet Take 81 mg by mouth daily.    . BD PEN NEEDLE NANO U/F 32G X 4 MM MISC USE EVERY MORNING 100 each 12  . Calcium Carb-Cholecalciferol (CALCIUM 600 + D PO) Take 1 tablet by mouth 2 (two) times daily.    . enalapril (VASOTEC) 2.5 MG tablet TAKE 1 TABLET BY MOUTH DAILY 90 tablet 2  . fluticasone (FLONASE) 50 MCG/ACT nasal spray PLACE 2 SPRAYS INTO BOTH NOSTRILS DAILY AS NEEDED FOR ALLERGIES. 16 g 12  . LEVEMIR FLEXTOUCH 100 UNIT/ML Pen INJECT 60 UNITS INTO THE SKIN DAILY AT 10 PM. 18 pen 0  . liraglutide (VICTOZA) 18 MG/3ML SOPN ADMINISTER 1.2MG UNDER THE SKIN DAILY 3 pen 12  . metoprolol succinate (TOPROL-XL) 25 MG 24 hr tablet TAKE 1 TABLET BY MOUTH EVERY DAY 90 tablet 0  . NOVOLOG FLEXPEN 100 UNIT/ML FlexPen INJECT 15U UNDER THE SKIN 3 TIMES DAILY WITH MEALS 15 pen 12  . ONETOUCH VERIO test strip USE TO CHECK BLOOD SUGAR DAILY 100 each 12  . ranitidine (ZANTAC) 150 MG tablet Take 150 mg by mouth 2 (two) times daily.    . sertraline (ZOLOFT) 100 MG tablet TAKE 1 TABLET (100 MG TOTAL) EVERY MORNING BY MOUTH. 90 tablet 1  . simvastatin (ZOCOR) 40 MG tablet Take 1 tablet (40 mg total) by mouth at bedtime. 90 tablet 2  . Specialty Vitamins Products (VITA-RX DIABETIC VITAMIN) CAPS Take 1 packet by mouth daily. Diabetic Vitamins - 6 or 7 in 1 pack     No current facility-administered medications for this visit.     OBJECTIVE: Vitals:   01/15/18 1053 01/15/18 1057  BP:  124/74  Pulse:  91  Resp: 16   Temp:  98 F (36.7 C)     Body mass index is 32.72 kg/m.    ECOG FS:0 - Asymptomatic  General: Well-developed, well-nourished, no acute distress. Eyes: Pink conjunctiva, anicteric sclera. HEENT: Normocephalic, moist mucous membranes. Lungs: Clear to auscultation  bilaterally. Heart: Regular rate and rhythm. No rubs, murmurs, or gallops. Abdomen: Soft, nontender, nondistended. No organomegaly noted, normoactive bowel sounds. Musculoskeletal: No edema, cyanosis, or clubbing. Neuro: Alert, answering all questions appropriately. Cranial nerves grossly intact. Skin: No rashes or petechiae noted. Psych: Normal affect.  LAB RESULTS:  Lab Results  Component Value Date   NA 138 01/08/2018   K 4.8 01/08/2018   CL 107 01/08/2018   CO2 23 01/08/2018   GLUCOSE 149 (H) 01/08/2018   BUN 20 01/08/2018   CREATININE 1.14 (H) 01/08/2018  CALCIUM 9.7 01/08/2018   PROT 7.6 11/13/2017   ALBUMIN 4.6 11/13/2017   AST 36 11/13/2017   ALT 21 11/13/2017   ALKPHOS 106 11/13/2017   BILITOT 0.3 11/13/2017   GFRNONAA 50 (L) 01/08/2018   GFRAA 58 (L) 01/08/2018    Lab Results  Component Value Date   WBC 9.5 01/08/2018   NEUTROABS 5.2 01/08/2018   HGB 12.0 01/08/2018   HCT 37.6 01/08/2018   MCV 93.8 01/08/2018   PLT 279 01/08/2018   Lab Results  Component Value Date   TOTALPROTELP 7.4 01/08/2018   ALBUMINELP 3.9 01/08/2018   A1GS 0.3 01/08/2018   A2GS 1.0 01/08/2018   BETS 1.0 01/08/2018   GAMS 1.3 01/08/2018   MSPIKE 0.8 (H) 01/08/2018   SPEI Comment 01/08/2018     STUDIES: No results found.  ASSESSMENT: MGUS  PLAN:    1.  MGUS: Upon review of patient's chart, her M spike was 0.3 in August 2013 and then 0.5 in March 2015.  Most recent M spike is unchanged at 0.8.  This is slowly trending up, but approximately only 0.1/year. Her kappa free light chains remain mildly elevated, but essentially unchanged at 43.1.  Patient does not have an M spike in her urine.  She continues to have no evidence of endorgan damage. Previously, metastatic bone survey was reported as negative.  Patient does not require bone marrow biopsy at this time.  No intervention is needed.  Return to clinic in 1 year with repeat laboratory work and further evaluation.   2.   Chronic renal insufficiency: Patient's creatinine is only mildly elevated at 1.14.  Continue follow-up with nephrology as indicated. 3.  Hyperglycemia: Patient blood glucose is elevated, but relatively well controlled.  Continue treatment and evaluation per primary care. 4.  Peripheral neuropathy: Secondary to diabetes.  Patient does not complain of this today.     Patient expressed understanding and was in agreement with this plan. She also understands that She can call clinic at any time with any questions, concerns, or complaints.   Lloyd Huger, MD   01/17/2018 2:01 PM

## 2018-01-14 NOTE — Telephone Encounter (Signed)
Requested Prescriptions  Pending Prescriptions Disp Refills  . metoprolol succinate (TOPROL-XL) 25 MG 24 hr tablet [Pharmacy Med Name: METOPROLOL SUCC ER 25 MG TAB] 90 tablet 0    Sig: TAKE 1 TABLET BY MOUTH EVERY DAY     Cardiovascular:  Beta Blockers Passed - 01/13/2018 12:19 PM      Passed - Last BP in normal range    BP Readings from Last 1 Encounters:  12/05/17 124/78         Passed - Last Heart Rate in normal range    Pulse Readings from Last 1 Encounters:  12/05/17 79         Passed - Valid encounter within last 6 months    Recent Outpatient Visits          2 months ago Annual physical exam   [facility] Terrilee Croak, Adriana M, PA-C   8 months ago Controlled type 2 diabetes mellitus with stage 2 chronic kidney disease, with long-term current use of insulin ([place])   [facility] Kathrine Haddock, NP   11 months ago Controlled type 2 diabetes mellitus with stage 2 chronic kidney disease, with long-term current use of insulin ([place])   [facility] Kathrine Haddock, NP   1 year ago CKD (chronic kidney disease) stage 2, GFR 60-89 ml/min   [facility] Kathrine Haddock, NP   1 year ago Pure hypercholesterolemia   [place] Kathrine Haddock, NP      Future Appointments            In 4 weeks Cannady, Barbaraann Faster, NP [facility]   In 56 months  [facility]         . amLODipine (NORVASC) 5 MG tablet [Pharmacy Med Name: AMLODIPINE BESYLATE 5 MG TAB] 90 tablet 0    Sig: TAKE 1 TABLET BY MOUTH EVERY DAY     Cardiovascular:  Calcium Channel Blockers Passed - 01/13/2018 12:19 PM      Passed - Last BP in normal range    BP Readings from Last 1 Encounters:  12/05/17 124/78         Passed - Valid encounter within last 6 months    Recent Outpatient Visits          2 months ago Annual physical exam   [facility] Terrilee Croak, Adriana M, PA-C   8 months ago Controlled type 2 diabetes  mellitus with stage 2 chronic kidney disease, with long-term current use of insulin ([place])   [facility] Kathrine Haddock, NP   11 months ago Controlled type 2 diabetes mellitus with stage 2 chronic kidney disease, with long-term current use of insulin ([place])   [facility] Kathrine Haddock, NP   1 year ago CKD (chronic kidney disease) stage 2, GFR 60-89 ml/min   [facility] Kathrine Haddock, NP   1 year ago Pure hypercholesterolemia   [place] Kathrine Haddock, NP      Future Appointments            In 4 weeks Cannady, Barbaraann Faster, NP [facility]   In 66 months  [facility]

## 2018-01-15 ENCOUNTER — Other Ambulatory Visit: Payer: Self-pay

## 2018-01-15 ENCOUNTER — Encounter: Payer: Self-pay | Admitting: Oncology

## 2018-01-15 ENCOUNTER — Inpatient Hospital Stay (HOSPITAL_BASED_OUTPATIENT_CLINIC_OR_DEPARTMENT_OTHER): Payer: Medicare HMO | Admitting: Oncology

## 2018-01-15 VITALS — BP 124/74 | HR 91 | Temp 98.0°F | Resp 16 | Ht 59.0 in | Wt 162.0 lb

## 2018-01-15 DIAGNOSIS — Z79899 Other long term (current) drug therapy: Secondary | ICD-10-CM

## 2018-01-15 DIAGNOSIS — E1142 Type 2 diabetes mellitus with diabetic polyneuropathy: Secondary | ICD-10-CM

## 2018-01-15 DIAGNOSIS — D472 Monoclonal gammopathy: Secondary | ICD-10-CM

## 2018-01-15 DIAGNOSIS — Z794 Long term (current) use of insulin: Secondary | ICD-10-CM | POA: Diagnosis not present

## 2018-01-15 DIAGNOSIS — Z7982 Long term (current) use of aspirin: Secondary | ICD-10-CM

## 2018-01-15 DIAGNOSIS — N189 Chronic kidney disease, unspecified: Secondary | ICD-10-CM

## 2018-01-15 DIAGNOSIS — E1165 Type 2 diabetes mellitus with hyperglycemia: Secondary | ICD-10-CM

## 2018-01-15 NOTE — Progress Notes (Signed)
Patient here for follow up. No changes since her last appointment. 

## 2018-01-24 ENCOUNTER — Other Ambulatory Visit: Payer: Self-pay | Admitting: Nurse Practitioner

## 2018-01-24 DIAGNOSIS — E1122 Type 2 diabetes mellitus with diabetic chronic kidney disease: Secondary | ICD-10-CM

## 2018-01-24 DIAGNOSIS — Z794 Long term (current) use of insulin: Secondary | ICD-10-CM

## 2018-01-24 DIAGNOSIS — N182 Chronic kidney disease, stage 2 (mild): Principal | ICD-10-CM

## 2018-01-24 NOTE — Telephone Encounter (Signed)
Call placed to pharmacist @ [facility] on file. Pharmacist is requesting that we resend the Rx for levemir flextouch pen

## 2018-02-12 ENCOUNTER — Ambulatory Visit (INDEPENDENT_AMBULATORY_CARE_PROVIDER_SITE_OTHER): Payer: Medicare HMO | Admitting: Nurse Practitioner

## 2018-02-12 ENCOUNTER — Encounter: Payer: Self-pay | Admitting: Nurse Practitioner

## 2018-02-12 ENCOUNTER — Other Ambulatory Visit: Payer: Self-pay

## 2018-02-12 VITALS — BP 122/77 | HR 90 | Temp 97.5°F | Ht 59.0 in | Wt 160.0 lb

## 2018-02-12 DIAGNOSIS — E7800 Pure hypercholesterolemia, unspecified: Secondary | ICD-10-CM

## 2018-02-12 DIAGNOSIS — N182 Chronic kidney disease, stage 2 (mild): Secondary | ICD-10-CM

## 2018-02-12 DIAGNOSIS — Z794 Long term (current) use of insulin: Secondary | ICD-10-CM | POA: Diagnosis not present

## 2018-02-12 DIAGNOSIS — I10 Essential (primary) hypertension: Secondary | ICD-10-CM

## 2018-02-12 DIAGNOSIS — Z23 Encounter for immunization: Secondary | ICD-10-CM

## 2018-02-12 DIAGNOSIS — E1122 Type 2 diabetes mellitus with diabetic chronic kidney disease: Secondary | ICD-10-CM

## 2018-02-12 LAB — MICROALBUMIN, URINE WAIVED
Creatinine, Urine Waived: 200 mg/dL (ref 10–300)
Microalb, Ur Waived: 30 mg/L — ABNORMAL HIGH (ref 0–19)
Microalb/Creat Ratio: <30 mg/g (ref <30)

## 2018-02-12 LAB — BAYER DCA HB A1C WAIVED: HB A1C: 6.4 % (ref <7.0)

## 2018-02-12 MED ORDER — INSULIN DETEMIR 100 UNIT/ML FLEXPEN: PEN_INJECTOR | SUBCUTANEOUS | 3 refills | Status: DC

## 2018-02-12 MED ORDER — AMLODIPINE BESYLATE 5 MG PO TABS: 5.0000 mg | ORAL_TABLET | Freq: Every day | ORAL | 3 refills | Status: DC

## 2018-02-12 MED ORDER — METOPROLOL SUCCINATE ER 25 MG PO TB24: 25.0000 mg | ORAL_TABLET | Freq: Every day | ORAL | 3 refills | Status: DC

## 2018-02-12 MED ORDER — ALLOPURINOL 300 MG PO TABS: 300.0000 mg | ORAL_TABLET | Freq: Every day | ORAL | 3 refills | Status: DC

## 2018-02-12 NOTE — Assessment & Plan Note (Signed)
Chronic, stable.  Continue statin.  Lipid panel today.

## 2018-02-12 NOTE — Assessment & Plan Note (Signed)
Chronic, stable.  Continue current regimen of medications.  CMP today.

## 2018-02-12 NOTE — Patient Instructions (Signed)
Your Child's First Vaccines: What You Need to Know The vaccines covered on this statement are those most likely to be given during the same visits during infancy and early childhood. Other vaccines (including measles, mumps, and rubella; varicella; rotavirus; influenza; and hepatitis A) are also routinely recommended during the first five years of life. Your child will get these vaccines today: _____DTaP _____Hib _____Hepatitis B _____Polio _____PCV13 (Provider: Check appropriate blanks.) 1. Why get vaccinated? Vaccine-preventable diseases are much less common than they used to be, thanks to vaccination. But they have not gone away. Outbreaks of some of these diseases still occur across the [place]. When fewer babies get vaccinated, more babies get sick. Seven childhood diseases that can be prevented by vaccines: 1. Diphtheria (the 'D' in DTaP vaccine)  Signs and symptoms include a thick coating in the back of the throat that can make it hard to breathe.  Diphtheria can lead to breathing problems, paralysis and heart failure. ? About 15,000 people died each year in the [place] from diphtheria before there was a vaccine. 2. Tetanus (the 'T' in DTaP vaccine, also known as Lockjaw)  Signs and symptoms include painful tightening of the muscles, usually all over the body.  Tetanus can lead to stiffness of the jaw that can make it difficult to open the mouth or swallow. ? Tetanus kills about 1 person out of every 10 who get it. 3. Pertussis (the 'P' in DTaP vaccine, also known as Whooping Cough)  Signs and symptoms include violent coughing spells that can make it hard for a baby to eat, drink, or breathe. These spells can last for several weeks.  Pertussis can lead to pneumonia, seizures, brain damage, or death. Pertussis can be very dangerous in infants. ? Most pertussis deaths are in babies younger than 55 months of age. 4. Hib ( Haemophilus influenzae type b)  Signs and symptoms can  include fever, headache, stiff neck, cough, and shortness of breath. There might not be any signs or symptoms in mild cases.  Hib can lead to meningitis (infection of the brain and spinal cord coverings); pneumonia; infections of the ears, sinuses, blood, joints, bones, and covering of the heart; brain damage; severe swelling of the throat, making it hard to breathe; and deafness. ? Children younger than 85 years of age are at greatest risk for Hib disease. 5. Hepatitis B  Signs and symptoms include tiredness, diarrhea and vomiting, jaundice (yellow skin or eyes), and pain in muscles, joints and stomach. But usually there are no signs or symptoms at all.  Hepatitis B can lead to liver damage, and liver cancer. Some people develop chronic (long term) hepatitis B infection. These people might not look or feel sick, but they can infect others. ? Hepatitis B can cause liver damage and cancer in 1 child out of 4 who are chronically infected. 6. Polio  Signs and symptoms can include flu-like illness, or there may be no signs or symptoms at all.  Polio can lead to permanent paralysis (can't move an arm or leg, or sometimes can't breathe) and death. ? In the 1950s, polio paralyzed more than 15,000 people every year in the [place] 7. Pneumococcal disease  Signs and symptoms include fever, chills, cough, and chest pain. In infants, symptoms can also include meningitis, seizures, and sometimes rash.  Pneumococcal disease can lead to meningitis (infection of the brain and spinal cord coverings); infections of the ears, sinuses and blood; pneumonia; deafness; and brain damage. ? About 1 out of  15 children who get pneumococcal meningitis will die from the infection. Children usually catch these diseases from other children or adults, who might not even know they are infected. A mother infected with hepatitis B can infect her baby at birth. Tetanus enters the body through a cut or wound; it is not spread from  person to person. Vaccines that protect your baby from these seven diseases:  Vaccine: DTaP (Diphtheria, Tetanus, Pertussis) ? Number of doses: 5 ? Recommended ages: 2 months, 4 months, 6 months, 15-18 months, 4-6 years ? Other information: Some children get a vaccine called DT (Diphtheria &amp; tetanus) instead of DTaP.  Vaccine: Hepatitis B ? Number of doses: 3 ? Recommended ages: Birth, 1-2 months, 6-18 months  Vaccine: Polio ? Number of doses: 4 ? Recommended ages: 2 months, 4 months, 6-18 months, 4-6 years ? Other information: An additional dose of polio vaccine may be recommended for travel to certain countries.  Vaccine: Hib (Haemophilus influenzae type b) ? Number of doses: 3 or 4 ? Recommended ages: 2 months, 4 months, (6 months), 12-15 months ? Other information: There are several Hib vaccines. With one of them the 35-month dose is not needed.  Vaccine: Pneumococcal (PCV13) ? Number of doses: 4 ? Recommended ages: 2 months, 4 months, 6 months, 12-15 months ? Other information: Older children with certain health conditions also need this vaccine. Your healthcare provider might offer some of these vaccines as combination vaccines-several vaccines given in the same shot. Combination vaccines are as safe and effective as the individual vaccines, and can mean fewer shots for your baby. 2. Some children should not get certain vaccines Most children can safely get all of these vaccines. But there are some exceptions:  A child who has a mild cold or other illness on the day vaccinations are scheduled may be vaccinated. A child who is moderately or severely ill on the day of vaccinations might be asked to come back for them at a later date.  Any child who had a life-threatening allergic reaction after getting a vaccine should not get another dose of that vaccine. Tell the person giving the vaccines if your child has ever had a severe reaction after any vaccination.  A child who has  a severe (life-threatening) allergy to a substance should not get a vaccine that contains that substance. Tell the person giving your child the vaccines if your child has any severe allergies that you are aware of.  Talk to your doctor before your child gets:  DTaP vaccine, if your child ever had any of these reactions after a previous dose of DTaP: ? A brain or nervous system disease within 7 days, ? Non-stop crying for 3 hours or more, ? A seizure or collapse, ? A fever of over 105F.  PCV13 vaccine, if your child ever had a severe reaction after a dose of DTaP (or other vaccine containing diphtheria toxoid), or after a dose of PCV7, an earlier pneumococcal vaccine. 3. What are the risks of a vaccine reaction? With any medicine, including vaccines, there is a chance of side effects. These are usually mild and go away on their own. Most vaccine reactions are not serious: tenderness, redness, or swelling where the shot was given; or a mild fever. These happen soon after the shot is given and go away within a day or two. They happen with up to about half of vaccinations, depending on the vaccine. Serious reactions are also possible but are rare. Polio, Hepatitis B  and Hib vaccines have been associated only with mild reactions. DTaP and pneumococcal vaccines have also been associated with other problems:  DTaP vaccine ? Mild problems: Fussiness (up to 1 child in 3); tiredness or loss of appetite (up to 1 child in 10); vomiting (up to 1 child in 50); swelling of the entire arm or leg for 1-7 days (up to 1 child in 30)-usually after the 4th or 5th dose. ? Moderate problems: Seizure (1 child in 14,000); non-stop crying for 3 hours or longer (up to 1 child in 1,000); fever over 105F (1 child in 16,000). ? Serious problems: Long term seizures, coma, lowered consciousness, and permanent brain damage have been reported following DTaP vaccination. These reports are extremely rare.  Pneumococcal  vaccine ? Mild problems: Drowsiness or temporary loss of appetite (about 1 child in 2 or 3); fussiness (about 8 children in 10). ? Moderate problems: Fever over 102.49F (about 1 child in 20).  After any vaccine: Any medication can cause a severe allergic reaction. Such reactions from a vaccine are very rare, estimated at about 1 in a million doses, and would happen within a few minutes to a few hours after the vaccination. As with any medicine, there is a very remote chance of a vaccine causing a serious injury or death. The safety of vaccines is always being monitored. For more information, visit: http://www.aguilar.org/ 4. What if there is a serious reaction? What should I look for? Look for anything that concerns you, such as signs of a severe allergic reaction, very high fever, or unusual behavior. Signs of a severe allergic reaction can include hives, swelling of the face and throat, and difficulty breathing. In infants, signs of an allergic reaction might also include fever, sleepiness, and disinterest in eating. In older children signs might include a fast heartbeat, dizziness, and weakness. These would usually start a few minutes to a few hours after the vaccination. What should I do?  If you think it is a severe allergic reaction or other emergency that can't wait, call 9-1-1 or get the person to the nearest hospital. Otherwise, call your doctor.  Afterward, the reaction should be reported to the Vaccine Adverse Event Reporting System (VAERS). Your doctor should file this report, or you can do it yourself through the VAERS web site at www.vaers.SamedayNews.es, or by calling 628-284-8279.  VAERS does not give medical advice. 5. The National Vaccine Injury Compensation Program The National Vaccine Injury Compensation Program (VICP) is a federal program that was created to compensate people who may have been injured by certain vaccines. Persons who believe they may have been injured by a  vaccine can learn about the program and about filing a claim by calling (539)096-3904 or visiting the [place] website at GoldCloset.com.ee. There is a time limit to file a claim for compensation. 6. How can I learn more?  Ask your healthcare provider. He or she can give you the vaccine package insert or suggest other sources of information.  Call your local or state health department.  Contact the Centers for Disease Control and Prevention (CDC): ? Call 6034580221 (1-800-CDC-INFO) ? Visit CDC's website at http://hunter.com/ or InternetEnthusiasts.hu Vaccine Information Statement, Multi Pediatric Vaccines (01/21/2014) This information is not intended to replace advice given to you by your health care provider. Make sure you discuss any questions you have with your health care provider. Document Released: 08/22/2007 Document Revised: 01/18/2016 Document Reviewed: 01/18/2016 Elsevier Interactive Patient Education  2017 [employer].

## 2018-02-12 NOTE — Progress Notes (Signed)
Established Patient Office Visit  Subjective:  Patient ID: Kara Leach, female    DOB: 03-18-1951  Age: 67 y.o. MRN: [id number]  CC:  Chief Complaint  Patient presents with  . Diabetes    25m f/u  . Hypertension  . Hyperlipidemia   DIABETES Patient is intolerant to Metformin and orals have not worked well for her in past.  August A1C 6.9%.  Takes 60 units Levemir at night.  Takes 16 units of Novolog at meal times.  Victoza 1.2 MG at night.  Is going to get PCV 13 shot # 1 today, discussed importance of immunizations with patient. Hypoglycemic episodes:no Polydipsia/polyuria: no Visual disturbance: no Chest pain: no Paresthesias: no Glucose Monitoring: yes  Accucheck frequency: BID  Fasting glucose: This morning 84, usually in 80-110 in morning.  Post prandial:  Evening: 140 range  Before meals: Taking Insulin?: yes  Long acting insulin: Levemir  Short acting insulin: Novolog Blood Pressure Monitoring: not checking Retinal Examination: Up to Date Foot Exam: Up to Date Diabetic Education: Completed Pneumovax: Not up to Date Influenza: Up to Date Aspirin: yes   HYPERTENSION / HYPERLIPIDEMIA Continues to report good control with current regimen. Satisfied with current treatment? yes Duration of hypertension: chronic BP monitoring frequency: not checking BP range:  BP medication side effects: no Past BP meds: does not recall Duration of hyperlipidemia: chronic Cholesterol medication side effects: no Cholesterol supplements: none Past cholesterol medications:none Medication compliance: excellent compliance Aspirin: yes Recent stressors: no Recurrent headaches: no Visual changes: no Palpitations: no Dyspnea: no Chest pain: no Lower extremity edema: no Dizzy/lightheaded: no  HPI Kara Leach presents for follow-up 3 month for T2DM, HTN, HLD  Past Medical History:  Diagnosis Date  . Allergy   . Arthritis    Osteoarthritis  . Chronic kidney  disease   . Depression   . Diabetes mellitus without complication ([place]) 2951  . Diabetic neuropathy (HCC)    feet  . Diverticulitis 2017  . GERD (gastroesophageal reflux disease)   . Headache    sinus  . Hyperlipidemia   . Hypertension   . Last menstrual period (LMP) > 10 days ago 1986  . Obesity   . Renal insufficiency    Stage 2 kidney disease  . Seizures ([place])    due to high blood sugars. last time = 09/2014  . Sleep apnea    has CPAP, can't tolerate  . Wears hearing aid    right side  . Wrist weakness    left - after fracture and repair    Past Surgical History:  Procedure Laterality Date  . ABDOMINAL HYSTERECTOMY    . APPENDECTOMY    . BREAST BIOPSY Left 07/03/2016   bx done by [facility] path pending  . CHOLECYSTECTOMY  2012  . COLONOSCOPY WITH PROPOFOL N/A 12/01/2015   Procedure: COLONOSCOPY WITH PROPOFOL;  Surgeon: [name], MD;  Location: [facility];  Service: Endoscopy;  Laterality: N/A;  Diabetic - insulin Sleep apnea  . PATELLA FRACTURE SURGERY Left   . WRIST FRACTURE SURGERY Left    Steel plate    Family History  Problem Relation Age of Onset  . Diabetes Mother   . Heart disease Mother   . Hypertension Mother   . Diabetes Sister   . Cancer Father        stomach  . Arthritis Sister   . Stroke Sister   . Hypertension Sister   . Breast cancer Maternal Aunt 13  great aunt    Social History   Socioeconomic History  . Marital status: Widowed    Spouse name: Not on file  . Number of children: Not on file  . Years of education: Not on file  . Highest education level: Not on file  Occupational History  . Not on file  Social Needs  . Financial resource strain: Not very hard  . Food insecurity:    Worry: Never true    Inability: Never true  . Transportation needs:    Medical: No    Non-medical: No  Tobacco Use  . Smoking status: Never Smoker  . Smokeless tobacco: Never Used  Substance and Sexual Activity  . Alcohol use: Not  Currently    Alcohol/week: 0.0 standard drinks    Comment: occasionally holiday, weddings  . Drug use: No  . Sexual activity: Never  Lifestyle  . Physical activity:    Days per week: 0 days    Minutes per session: 0 min  . Stress: Not at all  Relationships  . Social connections:    Talks on phone: More than three times a week    Gets together: More than three times a week    Attends religious service: More than 4 times per year    Active member of club or organization: No    Attends meetings of clubs or organizations: Never    Relationship status: Widowed  . Intimate partner violence:    Fear of current or ex partner: No    Emotionally abused: No    Physically abused: No    Forced sexual activity: No  Other Topics Concern  . Not on file  Social History Narrative  . Not on file    Outpatient Medications Prior to Visit  Medication Sig Dispense Refill  . acetaminophen (TYLENOL ARTHRITIS PAIN) 650 MG CR tablet Take 650 mg by mouth every 8 (eight) hours as needed for pain.    Marland Kitchen aspirin EC 81 MG tablet Take 81 mg by mouth daily.    . BD PEN NEEDLE NANO U/F 32G X 4 MM MISC USE EVERY MORNING 100 each 12  . Calcium Carb-Cholecalciferol (CALCIUM 600 + D PO) Take 1 tablet by mouth 2 (two) times daily.    . enalapril (VASOTEC) 2.5 MG tablet TAKE 1 TABLET BY MOUTH DAILY 90 tablet 2  . Famotidine (PEPCID PO) Take by mouth 2 (two) times daily.    Marland Kitchen liraglutide (VICTOZA) 18 MG/3ML SOPN ADMINISTER 1.2MG  UNDER THE SKIN DAILY 3 pen 12  . NOVOLOG FLEXPEN 100 UNIT/ML FlexPen INJECT 15U UNDER THE SKIN 3 TIMES DAILY WITH MEALS 15 pen 12  . ONETOUCH VERIO test strip USE TO CHECK BLOOD SUGAR DAILY 100 each 12  . sertraline (ZOLOFT) 100 MG tablet TAKE 1 TABLET (100 MG TOTAL) EVERY MORNING BY MOUTH. 90 tablet 1  . simvastatin (ZOCOR) 40 MG tablet Take 1 tablet (40 mg total) by mouth at bedtime. 90 tablet 2  . Specialty Vitamins Products (VITA-RX DIABETIC VITAMIN) CAPS Take 1 packet by mouth daily.  Diabetic Vitamins - 6 or 7 in 1 pack    . allopurinol (ZYLOPRIM) 300 MG tablet Take 1 tablet (300 mg total) by mouth daily. 90 tablet 0  . amLODipine (NORVASC) 5 MG tablet TAKE 1 TABLET BY MOUTH EVERY DAY 90 tablet 0  . LEVEMIR FLEXTOUCH 100 UNIT/ML Pen INJECT 60 UNITS INTO THE SKIN DAILY AT 10 PM. 18 pen 0  . metoprolol succinate (TOPROL-XL) 25 MG 24 hr tablet  TAKE 1 TABLET BY MOUTH EVERY DAY 90 tablet 0  . ranitidine (ZANTAC) 150 MG tablet Take 150 mg by mouth 2 (two) times daily.    . fluticasone (FLONASE) 50 MCG/ACT nasal spray PLACE 2 SPRAYS INTO BOTH NOSTRILS DAILY AS NEEDED FOR ALLERGIES. (Patient not taking: Reported on 02/12/2018) 16 g 12   No facility-administered medications prior to visit.     Allergies  Allergen Reactions  . Ibuprofen Other (See Comments)    "ringing in ears"  . Strawberry Extract Swelling    Lips swell and rash over body    ROS Review of Systems  Constitutional: Negative for activity change, appetite change, fatigue and fever.  Respiratory: Negative for cough, chest tightness, shortness of breath and wheezing.   Cardiovascular: Negative for chest pain, palpitations and leg swelling.  Gastrointestinal: Negative for abdominal distention, abdominal pain, constipation, diarrhea, nausea and vomiting.  Endocrine: Negative for cold intolerance, heat intolerance, polydipsia, polyphagia and polyuria.  Musculoskeletal: Negative.   Skin: Negative.   Neurological: Negative for dizziness, numbness and headaches.  Psychiatric/Behavioral: Negative.       Objective:    Physical Exam  Constitutional: She is oriented to person, place, and time. She appears well-developed and well-nourished.  HENT:  Head: Normocephalic and atraumatic.  Eyes: Pupils are equal, round, and reactive to light. Conjunctivae and EOM are normal. Right eye exhibits no discharge. Left eye exhibits no discharge.  Neck: Normal range of motion. Neck supple. No JVD present. Carotid bruit is not  present. No thyromegaly present.  Cardiovascular: Normal rate, regular rhythm and normal heart sounds. Exam reveals no gallop.  No murmur heard. Pulmonary/Chest: Effort normal and breath sounds normal.  Abdominal: Soft. Bowel sounds are normal. There is no splenomegaly or hepatomegaly.  Musculoskeletal: Normal range of motion.  Lymphadenopathy:    She has no cervical adenopathy.  Neurological: She is alert and oriented to person, place, and time. She has normal reflexes.  Skin: Skin is warm and dry.  Psychiatric: Her behavior is normal. Judgment and thought content normal.  Nursing note and vitals reviewed.   BP 122/77   Pulse 90   Temp (!) 97.5 F (36.4 C) (Oral)   Ht 4\' 11"  (1.499 m)   Wt 160 lb (72.6 kg)   LMP  (LMP Unknown)   SpO2 97%   BMI 32.32 kg/m  Wt Readings from Last 3 Encounters:  02/12/18 160 lb (72.6 kg)  01/15/18 162 lb (73.5 kg)  12/05/17 163 lb 4.8 oz (74.1 kg)     Health Maintenance Due  Topic Date Due  . DEXA SCAN  01/17/2018  . PNA vac Low Risk Adult (1 of 2 - PCV13) 01/17/2018    There are no preventive care reminders to display for this patient.  No results found for: TSH Lab Results  Component Value Date   WBC 9.5 01/08/2018   HGB 12.0 01/08/2018   HCT 37.6 01/08/2018   MCV 93.8 01/08/2018   PLT 279 01/08/2018   Lab Results  Component Value Date   NA 138 01/08/2018   K 4.8 01/08/2018   CO2 23 01/08/2018   GLUCOSE 149 (H) 01/08/2018   BUN 20 01/08/2018   CREATININE 1.14 (H) 01/08/2018   BILITOT 0.3 11/13/2017   ALKPHOS 106 11/13/2017   AST 36 11/13/2017   ALT 21 11/13/2017   PROT 7.6 11/13/2017   ALBUMIN 4.6 11/13/2017   CALCIUM 9.7 01/08/2018   ANIONGAP 8 01/08/2018   Lab Results  Component Value Date  CHOL 154 11/13/2017   Lab Results  Component Value Date   HDL 40 11/13/2017   Lab Results  Component Value Date   LDLCALC 45 11/13/2017   Lab Results  Component Value Date   TRIG 345 (H) 11/13/2017   Lab Results    Component Value Date   CHOLHDL 3.9 11/13/2017   Lab Results  Component Value Date   HGBA1C 6.9 (H) 11/13/2017      Assessment & Plan:   Problem List Items Addressed This Visit      Cardiovascular and Mediastinum   HTN (hypertension)    Chronic, stable.  Continue current regimen of medications.  CMP today.      Relevant Medications   amLODipine (NORVASC) 5 MG tablet   metoprolol succinate (TOPROL-XL) 25 MG 24 hr tablet   Other Relevant Orders   Microalbumin, Urine Waived   Comprehensive metabolic panel     Endocrine   Controlled type 2 diabetes mellitus with chronic kidney disease, with long-term current use of insulin (HCC) - Primary    Chronic, stable.  Continues to have good control Victoza and insulin therapy.  Previous A1C 6.9%, today A1C 6.4%.  Urine Microalbumin today with ALB 30 and A:C <30.  Have recommended she continue monitoring AM glucose closely, if <80 she is to reduce Levemir by 3 units.  She was able to verbalize back instructions.  Discussed this is to reduce hypoglycemia risk.        Relevant Medications   Insulin Detemir (LEVEMIR FLEXTOUCH) 100 UNIT/ML Pen   Other Relevant Orders   Microalbumin, Urine Waived   Bayer DCA Hb A1c Waived     Other   Hyperlipidemia    Chronic, stable.  Continue statin.  Lipid panel today.      Relevant Medications   amLODipine (NORVASC) 5 MG tablet   metoprolol succinate (TOPROL-XL) 25 MG 24 hr tablet   Other Relevant Orders   Comprehensive metabolic panel      Meds ordered this encounter  Medications  . Insulin Detemir (LEVEMIR FLEXTOUCH) 100 UNIT/ML Pen    Sig: INJECT 60 UNITS INTO THE SKIN DAILY AT 10 PM.    Dispense:  18 pen    Refill:  3  . allopurinol (ZYLOPRIM) 300 MG tablet    Sig: Take 1 tablet (300 mg total) by mouth daily.    Dispense:  90 tablet    Refill:  3  . amLODipine (NORVASC) 5 MG tablet    Sig: Take 1 tablet (5 mg total) by mouth daily.    Dispense:  90 tablet    Refill:  3  .  metoprolol succinate (TOPROL-XL) 25 MG 24 hr tablet    Sig: Take 1 tablet (25 mg total) by mouth daily.    Dispense:  90 tablet    Refill:  3    Follow-up: Return in about 3 months (around 05/15/2018) for T2DM, HTN/HLD (lipid panel & A1C).    Venita Lick, NP

## 2018-02-12 NOTE — Assessment & Plan Note (Addendum)
Chronic, stable.  Continues to have good control Victoza and insulin therapy.  Previous A1C 6.9%, today A1C 6.4%.  Urine Microalbumin today with ALB 30 and A:C <30.  Have recommended she continue monitoring AM glucose closely, if <80 she is to reduce Levemir by 3 units.  She was able to verbalize back instructions.  Discussed this is to reduce hypoglycemia risk.

## 2018-02-13 ENCOUNTER — Encounter: Payer: Self-pay | Admitting: Nurse Practitioner

## 2018-02-13 LAB — COMPREHENSIVE METABOLIC PANEL
ALT: 16 IU/L (ref 0–32)
AST: 23 IU/L (ref 0–40)
Albumin/Globulin Ratio: 1.6 (ref 1.2–2.2)
Albumin: 4.5 g/dL (ref 3.6–4.8)
Alkaline Phosphatase: 110 IU/L (ref 39–117)
BUN/Creatinine Ratio: 9 — ABNORMAL LOW (ref 12–28)
BUN: 10 mg/dL (ref 8–27)
Bilirubin Total: 0.3 mg/dL (ref 0.0–1.2)
CALCIUM: 9.8 mg/dL (ref 8.7–10.3)
CO2: 21 mmol/L (ref 20–29)
CREATININE: 1.06 mg/dL — AB (ref 0.57–1.00)
Chloride: 102 mmol/L (ref 96–106)
GFR calc Af Amer: 64 mL/min/{1.73_m2} (ref >59)
GFR, EST NON AFRICAN AMERICAN: 55 mL/min/{1.73_m2} — AB (ref >59)
Globulin, Total: 2.9 g/dL (ref 1.5–4.5)
Glucose: 91 mg/dL (ref 65–99)
POTASSIUM: 4.7 mmol/L (ref 3.5–5.2)
Sodium: 140 mmol/L (ref 134–144)
Total Protein: 7.4 g/dL (ref 6.0–8.5)

## 2018-02-17 ENCOUNTER — Other Ambulatory Visit: Payer: Self-pay | Admitting: Unknown Physician Specialty

## 2018-02-20 ENCOUNTER — Other Ambulatory Visit: Payer: Self-pay | Admitting: Physician Assistant

## 2018-02-20 DIAGNOSIS — Z1231 Encounter for screening mammogram for malignant neoplasm of breast: Secondary | ICD-10-CM

## 2018-03-07 ENCOUNTER — Encounter: Payer: Self-pay | Admitting: Family Medicine

## 2018-03-07 ENCOUNTER — Ambulatory Visit (INDEPENDENT_AMBULATORY_CARE_PROVIDER_SITE_OTHER): Payer: Medicare HMO | Admitting: Family Medicine

## 2018-03-07 ENCOUNTER — Other Ambulatory Visit: Payer: Self-pay

## 2018-03-07 VITALS — BP 124/81 | HR 101 | Temp 97.7°F | Ht 59.0 in | Wt 159.0 lb

## 2018-03-07 DIAGNOSIS — K5792 Diverticulitis of intestine, part unspecified, without perforation or abscess without bleeding: Secondary | ICD-10-CM | POA: Diagnosis not present

## 2018-03-07 DIAGNOSIS — R197 Diarrhea, unspecified: Secondary | ICD-10-CM | POA: Diagnosis not present

## 2018-03-07 DIAGNOSIS — R350 Frequency of micturition: Secondary | ICD-10-CM | POA: Diagnosis not present

## 2018-03-07 LAB — CBC WITH DIFFERENTIAL/PLATELET
HEMATOCRIT: 38 % (ref 34.0–46.6)
Hemoglobin: 12.9 g/dL (ref 11.1–15.9)
Lymphocytes Absolute: 3.2 10*3/uL — ABNORMAL HIGH (ref 0.7–3.1)
Lymphs: 30 %
MCH: 31.2 pg (ref 26.6–33.0)
MCHC: 33.9 g/dL (ref 31.5–35.7)
MCV: 92 fL (ref 79–97)
MID (Absolute): 1 10*3/uL (ref 0.1–1.6)
MID: 9 %
Neutrophils Absolute: 6.6 10*3/uL (ref 1.4–7.0)
Neutrophils: 61 %
Platelets: 308 10*3/uL (ref 150–450)
RBC: 4.14 x10E6/uL (ref 3.77–5.28)
RDW: 14.4 % (ref 12.3–15.4)
WBC: 10.8 10*3/uL (ref 3.4–10.8)

## 2018-03-07 MED ORDER — CIPROFLOXACIN HCL 500 MG PO TABS: 500.0000 mg | ORAL_TABLET | Freq: Two times a day (BID) | ORAL | 0 refills | Status: DC

## 2018-03-07 MED ORDER — METRONIDAZOLE 500 MG PO TABS: 500.0000 mg | ORAL_TABLET | Freq: Two times a day (BID) | ORAL | 0 refills | Status: DC

## 2018-03-07 NOTE — Progress Notes (Signed)
BP 124/81   Pulse (!) 101   Temp 97.7 F (36.5 C) (Oral)   Ht 4\' 11"  (1.499 m)   Wt 159 lb (72.1 kg)   LMP  (LMP Unknown)   SpO2 97%   BMI 32.11 kg/m    Subjective:    Patient ID: Kara Leach, female    DOB: 1950-07-28, 67 y.o.   MRN: [id number]  HPI: Kara Leach is a 67 y.o. female  Chief Complaint  Patient presents with  . Abdominal Pain    x about a week ago/ pt has had stomach bug/ has tried OTC meds  . Emesis  . Diarrhea   ABDOMINAL ISSUES Duration: about a week- has been feeling a little better, but still having diarrhea Nature: cramping Location: LLQ  Severity: moderate  Radiation: no Frequency: coming and going Alleviating factors: nothing Aggravating factors: eating Treatments attempted: imodium Constipation: no Diarrhea: yes Episodes of diarrhea/day: 8-10x a day Mucous in the stool: yes Heartburn: yes Bloating:yes Flatulence: yes Nausea: yes Vomiting: yes- first few days, better now Melena or hematochezia: no Rash: no Jaundice: no Fever: no Weight loss: no   URINARY SYMPTOMS Duration: 3-4 days Dysuria: yes Urinary frequency: yes Urgency: yes Small volume voids: yes Symptom severity: moderate Urinary incontinence: no Foul odor: yes Hematuria: no Abdominal pain: yes Back pain: yes Suprapubic pain/pressure: no Flank pain: no Fever:  no Vomiting: yes Relief with cranberry juice: no Relief with pyridium: no Status: stable Previous urinary tract infection: no Recurrent urinary tract infection: no Treatments attempted: increasing fluids    Relevant past medical, surgical, family and social history reviewed and updated as indicated. Interim medical history since our last visit reviewed. Allergies and medications reviewed and updated.  Review of Systems  Constitutional: Negative.   HENT: Negative.   Respiratory: Negative.   Cardiovascular: Negative.   Gastrointestinal: Positive for abdominal pain, diarrhea, nausea and  vomiting. Negative for abdominal distention, anal bleeding, blood in stool, constipation and rectal pain.  Genitourinary: Positive for dysuria, frequency and urgency. Negative for decreased urine volume, difficulty urinating, dyspareunia, enuresis, flank pain, genital sores, hematuria, menstrual problem, pelvic pain, vaginal bleeding, vaginal discharge and vaginal pain.  Musculoskeletal: Negative.   Skin: Negative.   Psychiatric/Behavioral: Negative.     Per HPI unless specifically indicated above     Objective:    BP 124/81   Pulse (!) 101   Temp 97.7 F (36.5 C) (Oral)   Ht 4\' 11"  (1.499 m)   Wt 159 lb (72.1 kg)   LMP  (LMP Unknown)   SpO2 97%   BMI 32.11 kg/m   Wt Readings from Last 3 Encounters:  03/07/18 159 lb (72.1 kg)  02/12/18 160 lb (72.6 kg)  01/15/18 162 lb (73.5 kg)    Physical Exam Vitals signs and nursing note reviewed.  Constitutional:      General: She is not in acute distress.    Appearance: Normal appearance. She is obese. She is not ill-appearing, toxic-appearing or diaphoretic.  HENT:     Head: Normocephalic and atraumatic.     Right Ear: External ear normal.     Left Ear: External ear normal.     Nose: Nose normal.     Mouth/Throat:     Mouth: Mucous membranes are moist.     Pharynx: Oropharynx is clear.  Eyes:     General: No scleral icterus.       Right eye: No discharge.        Left eye:  No discharge.     Extraocular Movements: Extraocular movements intact.     Conjunctiva/sclera: Conjunctivae normal.     Pupils: Pupils are equal, round, and reactive to light.  Neck:     Musculoskeletal: Normal range of motion and neck supple.  Cardiovascular:     Rate and Rhythm: Normal rate and regular rhythm.     Pulses: Normal pulses.     Heart sounds: Normal heart sounds. No murmur. No friction rub. No gallop.   Pulmonary:     Effort: Pulmonary effort is normal. No respiratory distress.     Breath sounds: Normal breath sounds. No stridor. No  wheezing, rhonchi or rales.  Chest:     Chest wall: No tenderness.  Abdominal:     General: Abdomen is flat. Bowel sounds are normal. There is no distension or abdominal bruit. There are no signs of injury.     Palpations: Abdomen is soft.     Tenderness: There is abdominal tenderness in the left lower quadrant.     Hernia: No hernia is present.  Musculoskeletal: Normal range of motion.  Skin:    General: Skin is warm and dry.     Capillary Refill: Capillary refill takes less than 2 seconds.     Coloration: Skin is not jaundiced or pale.     Findings: No bruising, erythema, lesion or rash.  Neurological:     General: No focal deficit present.     Mental Status: She is alert and oriented to person, place, and time. Mental status is at baseline.  Psychiatric:        Mood and Affect: Mood normal.        Behavior: Behavior normal.        Thought Content: Thought content normal.        Judgment: Judgment normal.     Results for orders placed or performed in visit on 02/12/18  Microalbumin, Urine Waived  Result Value Ref Range   Microalb, Ur Waived 30 (H) 0 - 19 mg/L   Creatinine, Urine Waived 200 10 - 300 mg/dL   Microalb/Creat Ratio <30 <30 mg/g  Bayer DCA Hb A1c Waived  Result Value Ref Range   HB A1C (BAYER DCA - WAIVED) 6.4 <7.0 %  Comprehensive metabolic panel  Result Value Ref Range   Glucose 91 65 - 99 mg/dL   BUN 10 8 - 27 mg/dL   Creatinine, Ser 1.06 (H) 0.57 - 1.00 mg/dL   GFR calc non Af Amer 55 (L) >59 mL/min/1.73   GFR calc Af Amer 64 >59 mL/min/1.73   BUN/Creatinine Ratio 9 (L) 12 - 28   Sodium 140 134 - 144 mmol/L   Potassium 4.7 3.5 - 5.2 mmol/L   Chloride 102 96 - 106 mmol/L   CO2 21 20 - 29 mmol/L   Calcium 9.8 8.7 - 10.3 mg/dL   Total Protein 7.4 6.0 - 8.5 g/dL   Albumin 4.5 3.6 - 4.8 g/dL   Globulin, Total 2.9 1.5 - 4.5 g/dL   Albumin/Globulin Ratio 1.6 1.2 - 2.2   Bilirubin Total 0.3 0.0 - 1.2 mg/dL   Alkaline Phosphatase 110 39 - 117 IU/L   AST 23  0 - 40 IU/L   ALT 16 0 - 32 IU/L      Assessment & Plan:   Problem List Items Addressed This Visit    None    Visit Diagnoses    Diverticulitis    -  Primary   WBC 10.8- pain in LLQ,  history of diverticulitis with abscess. Will treat with cipro and flagyl. Recheck 1 week to confirm resolution. Call if not better- CT.   Diarrhea, unspecified type       WBC 10.8- pain in LLQ, history of diverticulitis with abscess. Will treat with cipro and flagyl. Recheck 1 week to confirm resolution. Call if not better- CT.   Relevant Orders   CBC With Differential/Platelet   Frequency of micturition       Unable to void- will treat with cipro for the diverticulitis   Relevant Orders   UA/M w/rflx Culture, Routine       Follow up plan: Return in about 1 week (around 03/14/2018) for follow up diverticulitis.

## 2018-03-14 ENCOUNTER — Ambulatory Visit (INDEPENDENT_AMBULATORY_CARE_PROVIDER_SITE_OTHER): Payer: Medicare HMO | Admitting: Nurse Practitioner

## 2018-03-14 ENCOUNTER — Encounter: Payer: Self-pay | Admitting: Nurse Practitioner

## 2018-03-14 DIAGNOSIS — K5720 Diverticulitis of large intestine with perforation and abscess without bleeding: Secondary | ICD-10-CM

## 2018-03-14 NOTE — Progress Notes (Signed)
BP 124/75 (BP Location: Left Arm, Patient Position: Sitting, Cuff Size: Normal)   Pulse 85   Temp 97.6 F (36.4 C) (Oral)   Ht 4\' 11"  (1.499 m)   Wt 162 lb 3.2 oz (73.6 kg)   LMP  (LMP Unknown)   SpO2 98%   BMI 32.76 kg/m    Subjective:    Patient ID: Kara Leach, female    DOB: [birth year], 67 y.o.   MRN: [id number]  HPI: Kara Leach is a 67 y.o. female  Chief Complaint  Patient presents with  . Diverticulitis    Follow-up   DIVERTICULITIS F/U: Was treated for diverticulitis on 03/07/18.  Was treated with Cipro and Flagyl.  She reports she is "feeling better".  States she ate Christmas dinner without issue.  Reports she still feels "a little weak" because she did not eat a whole lot for a few days when sick. Some loose stool still present, about 4 a day.  Prior to this was having 10 liquid stools a day and current stools are more formed.  Denies fever, cramping, N&V, or abdominal pain.  Drinking fluids per baseline.  Completed abx course last night.   Relevant past medical, surgical, family and social history reviewed and updated as indicated. Interim medical history since our last visit reviewed. Allergies and medications reviewed and updated.  Review of Systems  Constitutional: Negative for activity change, appetite change, diaphoresis, fatigue and fever.  Respiratory: Negative for cough, chest tightness and shortness of breath.   Cardiovascular: Negative for chest pain, palpitations and leg swelling.  Gastrointestinal: Positive for diarrhea (intermittent, but improved). Negative for abdominal distention, abdominal pain, constipation, nausea and vomiting.  Endocrine: Negative for cold intolerance, heat intolerance, polydipsia, polyphagia and polyuria.  Neurological: Negative for dizziness, syncope, weakness, light-headedness, numbness and headaches.  Psychiatric/Behavioral: Negative.     Per HPI unless specifically indicated above     Objective:    BP 124/75  (BP Location: Left Arm, Patient Position: Sitting, Cuff Size: Normal)   Pulse 85   Temp 97.6 F (36.4 C) (Oral)   Ht 4\' 11"  (1.499 m)   Wt 162 lb 3.2 oz (73.6 kg)   LMP  (LMP Unknown)   SpO2 98%   BMI 32.76 kg/m   Wt Readings from Last 3 Encounters:  03/14/18 162 lb 3.2 oz (73.6 kg)  03/07/18 159 lb (72.1 kg)  02/12/18 160 lb (72.6 kg)    Physical Exam Vitals signs and nursing note reviewed.  Constitutional:      Appearance: She is well-developed.  HENT:     Head: Normocephalic.  Eyes:     General:        Right eye: No discharge.        Left eye: No discharge.     Conjunctiva/sclera: Conjunctivae normal.     Pupils: Pupils are equal, round, and reactive to light.  Neck:     Musculoskeletal: Normal range of motion and neck supple.     Thyroid: No thyromegaly.     Vascular: No carotid bruit or JVD.  Cardiovascular:     Rate and Rhythm: Normal rate and regular rhythm.     Heart sounds: Normal heart sounds.  Pulmonary:     Effort: Pulmonary effort is normal.     Breath sounds: Normal breath sounds.  Abdominal:     General: Bowel sounds are normal. There is no distension.     Palpations: Abdomen is soft. There is no mass.  Tenderness: There is no abdominal tenderness. There is no guarding or rebound.  Lymphadenopathy:     Cervical: No cervical adenopathy.  Skin:    General: Skin is warm and dry.  Neurological:     Mental Status: She is alert and oriented to person, place, and time.  Psychiatric:        Mood and Affect: Mood normal.        Behavior: Behavior normal.        Thought Content: Thought content normal.        Judgment: Judgment normal.     Results for orders placed or performed in visit on 03/07/18  CBC With Differential/Platelet  Result Value Ref Range   WBC 10.8 3.4 - 10.8 x10E3/uL   RBC 4.14 3.77 - 5.28 x10E6/uL   Hemoglobin 12.9 11.1 - 15.9 g/dL   Hematocrit 38.0 34.0 - 46.6 %   MCV 92 79 - 97 fL   MCH 31.2 26.6 - 33.0 pg   MCHC 33.9 31.5 -  35.7 g/dL   RDW 14.4 12.3 - 15.4 %   Platelets 308 150 - 450 x10E3/uL   Neutrophils 61 Not Estab. %   Lymphs 30 Not Estab. %   MID 9 Not Estab. %   Neutrophils Absolute 6.6 1.4 - 7.0 x10E3/uL   Lymphocytes Absolute 3.2 (H) 0.7 - 3.1 x10E3/uL   MID (Absolute) 1.0 0.1 - 1.6 X10E3/uL      Assessment & Plan:   Problem List Items Addressed This Visit      Digestive   Colonic diverticular abscess    Overall improvement in diverticulitis.  Continue to monitor.  No further abx at this time.  Return for worsening or return of symptoms.          Follow up plan: Return as scheduled in February or as needed.

## 2018-03-14 NOTE — Assessment & Plan Note (Signed)
Overall improvement in diverticulitis.  Continue to monitor.  No further abx at this time.  Return for worsening or return of symptoms.

## 2018-03-14 NOTE — Patient Instructions (Signed)

## 2018-03-27 ENCOUNTER — Ambulatory Visit
Admission: RE | Admit: 2018-03-27 | Discharge: 2018-03-27 | Disposition: A | Discharge status: Home / Self Care | Payer: Medicare HMO | Source: Ambulatory Visit | Attending: Physician Assistant | Admitting: Physician Assistant

## 2018-03-27 DIAGNOSIS — Z1231 Encounter for screening mammogram for malignant neoplasm of breast: Secondary | ICD-10-CM | POA: Diagnosis not present

## 2018-03-28 ENCOUNTER — Other Ambulatory Visit: Payer: Self-pay | Admitting: Physician Assistant

## 2018-03-28 DIAGNOSIS — R928 Other abnormal and inconclusive findings on diagnostic imaging of breast: Secondary | ICD-10-CM

## 2018-03-30 DIAGNOSIS — R69 Illness, unspecified: Secondary | ICD-10-CM | POA: Diagnosis not present

## 2018-04-10 ENCOUNTER — Ambulatory Visit
Admission: RE | Admit: 2018-04-10 | Discharge: 2018-04-10 | Disposition: A | Discharge status: Home / Self Care | Payer: Medicare HMO | Source: Ambulatory Visit | Attending: Nurse Practitioner | Admitting: Nurse Practitioner

## 2018-04-10 DIAGNOSIS — R928 Other abnormal and inconclusive findings on diagnostic imaging of breast: Secondary | ICD-10-CM | POA: Insufficient documentation

## 2018-04-10 DIAGNOSIS — N6323 Unspecified lump in the left breast, lower outer quadrant: Secondary | ICD-10-CM | POA: Diagnosis not present

## 2018-04-11 ENCOUNTER — Other Ambulatory Visit: Payer: Self-pay | Admitting: Nurse Practitioner

## 2018-04-11 DIAGNOSIS — R928 Other abnormal and inconclusive findings on diagnostic imaging of breast: Secondary | ICD-10-CM

## 2018-04-17 ENCOUNTER — Other Ambulatory Visit: Payer: Self-pay | Admitting: Surgery

## 2018-04-23 ENCOUNTER — Ambulatory Visit
Admission: RE | Admit: 2018-04-23 | Discharge: 2018-04-23 | Disposition: A | Discharge status: Home / Self Care | Payer: Medicare HMO | Source: Ambulatory Visit | Attending: Nurse Practitioner | Admitting: Nurse Practitioner

## 2018-04-23 DIAGNOSIS — R928 Other abnormal and inconclusive findings on diagnostic imaging of breast: Secondary | ICD-10-CM

## 2018-04-23 DIAGNOSIS — Z7689 Persons encountering health services in other specified circumstances: Secondary | ICD-10-CM | POA: Diagnosis not present

## 2018-04-23 HISTORY — PX: BREAST BIOPSY: SHX20

## 2018-04-24 LAB — SURGICAL PATHOLOGY

## 2018-04-28 ENCOUNTER — Telehealth: Payer: Self-pay | Admitting: Nurse Practitioner

## 2018-04-28 DIAGNOSIS — R928 Other abnormal and inconclusive findings on diagnostic imaging of breast: Secondary | ICD-10-CM

## 2018-04-28 NOTE — Telephone Encounter (Signed)
Spoke to [place] at [facility] about Ms. Boyajian's recent mammogram results.  She reports "findings were benign to left breast, but radiologist reports it could be early papilloma or complex fibroadenoma for which he recommend surgical consult and excision".  She states both herself and radiologist have attempted calling patient + leaving messages, but have been unable to get in touch with patient.  On review in past patient has seen general surgery for similar issues, Dr. Bary Castilla.  This provider attempted to call patient to go over results and recommendations, left general message for her to call office for recent results, as not answer.  If able to get in touch with patient and she agrees with consult will place referral to Dr. Bary Castilla.

## 2018-04-28 NOTE — Telephone Encounter (Signed)
Copied from Elias-Fela Solis [id number]. Topic: Quick Communication >> Apr 28, 2018 10:22 AM Loma Boston wrote: CRM for notification. See Telephone encounter for: 04/28/18. Mechele Claude at [facility] needs a call back in regards to the result of mamo/ and notifying PT/ call back from nurse will suffice [phone]

## 2018-04-29 NOTE — Telephone Encounter (Signed)
Left message on machine for pt to return call to the office.  

## 2018-05-01 NOTE — Telephone Encounter (Signed)
Left message on machine for pt to return call to the office.  

## 2018-05-02 NOTE — Telephone Encounter (Signed)
Called and left a message for emergency contact to see if patient will call us back.

## 2018-05-02 NOTE — Telephone Encounter (Signed)
Please place referral for Dr.Byrnett, patient notified of results. Patient's phone is messed up, please leave a message or contact son under emergency contacts.

## 2018-05-02 NOTE — Addendum Note (Signed)
Addended by: Valerie Roys on: 05/02/2018 02:04 PM   Modules accepted: Orders

## 2018-05-15 ENCOUNTER — Ambulatory Visit (INDEPENDENT_AMBULATORY_CARE_PROVIDER_SITE_OTHER): Payer: Medicare HMO | Admitting: Nurse Practitioner

## 2018-05-15 ENCOUNTER — Encounter: Payer: Self-pay | Admitting: Nurse Practitioner

## 2018-05-15 VITALS — BP 100/56 | HR 87 | Temp 97.6°F | Ht 59.0 in | Wt 161.0 lb

## 2018-05-15 DIAGNOSIS — E1122 Type 2 diabetes mellitus with diabetic chronic kidney disease: Secondary | ICD-10-CM | POA: Diagnosis not present

## 2018-05-15 DIAGNOSIS — N182 Chronic kidney disease, stage 2 (mild): Secondary | ICD-10-CM | POA: Diagnosis not present

## 2018-05-15 DIAGNOSIS — E785 Hyperlipidemia, unspecified: Secondary | ICD-10-CM | POA: Diagnosis not present

## 2018-05-15 DIAGNOSIS — Z780 Asymptomatic menopausal state: Secondary | ICD-10-CM

## 2018-05-15 DIAGNOSIS — N183 Chronic kidney disease, stage 3 unspecified: Secondary | ICD-10-CM

## 2018-05-15 DIAGNOSIS — I1310 Hypertensive heart and chronic kidney disease without heart failure, with stage 1 through stage 4 chronic kidney disease, or unspecified chronic kidney disease: Secondary | ICD-10-CM | POA: Diagnosis not present

## 2018-05-15 DIAGNOSIS — Z794 Long term (current) use of insulin: Secondary | ICD-10-CM | POA: Diagnosis not present

## 2018-05-15 DIAGNOSIS — E1169 Type 2 diabetes mellitus with other specified complication: Secondary | ICD-10-CM

## 2018-05-15 NOTE — Patient Instructions (Signed)
Diabetes Mellitus and Nutrition, Adult  When you have diabetes (diabetes mellitus), it is very important to have healthy eating habits because your blood sugar (glucose) levels are greatly affected by what you eat and drink. Eating healthy foods in the appropriate amounts, at about the same times every day, can help you:  · Control your blood glucose.  · Lower your risk of heart disease.  · Improve your blood pressure.  · Reach or maintain a healthy weight.  Every person with diabetes is different, and each person has different needs for a meal plan. Your health care provider may recommend that you work with a diet and nutrition specialist (dietitian) to make a meal plan that is best for you. Your meal plan may vary depending on factors such as:  · The calories you need.  · The medicines you take.  · Your weight.  · Your blood glucose, blood pressure, and cholesterol levels.  · Your activity level.  · Other health conditions you have, such as heart or kidney disease.  How do carbohydrates affect me?  Carbohydrates, also called carbs, affect your blood glucose level more than any other type of food. Eating carbs naturally raises the amount of glucose in your blood. Carb counting is a method for keeping track of how many carbs you eat. Counting carbs is important to keep your blood glucose at a healthy level, especially if you use insulin or take certain oral diabetes medicines.  It is important to know how many carbs you can safely have in each meal. This is different for every person. Your dietitian can help you calculate how many carbs you should have at each meal and for each snack.  Foods that contain carbs include:  · Bread, cereal, rice, pasta, and crackers.  · Potatoes and corn.  · Peas, beans, and lentils.  · Milk and yogurt.  · Fruit and juice.  · Desserts, such as cakes, cookies, ice cream, and candy.  How does alcohol affect me?  Alcohol can cause a sudden decrease in blood glucose (hypoglycemia),  especially if you use insulin or take certain oral diabetes medicines. Hypoglycemia can be a life-threatening condition. Symptoms of hypoglycemia (sleepiness, dizziness, and confusion) are similar to symptoms of having too much alcohol.  If your health care provider says that alcohol is safe for you, follow these guidelines:  · Limit alcohol intake to no more than 1 drink per day for nonpregnant women and 2 drinks per day for men. One drink equals 12 oz of beer, 5 oz of wine, or 1½ oz of hard liquor.  · Do not drink on an empty stomach.  · Keep yourself hydrated with water, diet soda, or unsweetened iced tea.  · Keep in mind that regular soda, juice, and other mixers may contain a lot of sugar and must be counted as carbs.  What are tips for following this plan?    Reading food labels  · Start by checking the serving size on the "Nutrition Facts" label of packaged foods and drinks. The amount of calories, carbs, fats, and other nutrients listed on the label is based on one serving of the item. Many items contain more than one serving per package.  · Check the total grams (g) of carbs in one serving. You can calculate the number of servings of carbs in one serving by dividing the total carbs by 15. For example, if a food has 30 g of total carbs, it would be equal to 2   servings of carbs.  · Check the number of grams (g) of saturated and trans fats in one serving. Choose foods that have low or no amount of these fats.  · Check the number of milligrams (mg) of salt (sodium) in one serving. Most people should limit total sodium intake to less than 2,300 mg per day.  · Always check the nutrition information of foods labeled as "low-fat" or "nonfat". These foods may be higher in added sugar or refined carbs and should be avoided.  · Talk to your dietitian to identify your daily goals for nutrients listed on the label.  Shopping  · Avoid buying canned, premade, or processed foods. These foods tend to be high in fat, sodium,  and added sugar.  · Shop around the outside edge of the grocery store. This includes fresh fruits and vegetables, bulk grains, fresh meats, and fresh dairy.  Cooking  · Use low-heat cooking methods, such as baking, instead of high-heat cooking methods like deep frying.  · Cook using healthy oils, such as olive, canola, or sunflower oil.  · Avoid cooking with butter, cream, or high-fat meats.  Meal planning  · Eat meals and snacks regularly, preferably at the same times every day. Avoid going long periods of time without eating.  · Eat foods high in fiber, such as fresh fruits, vegetables, beans, and whole grains. Talk to your dietitian about how many servings of carbs you can eat at each meal.  · Eat 4-6 ounces (oz) of lean protein each day, such as lean meat, chicken, fish, eggs, or tofu. One oz of lean protein is equal to:  ? 1 oz of meat, chicken, or fish.  ? 1 egg.  ? ¼ cup of tofu.  · Eat some foods each day that contain healthy fats, such as avocado, nuts, seeds, and fish.  Lifestyle  · Check your blood glucose regularly.  · Exercise regularly as told by your health care provider. This may include:  ? 150 minutes of moderate-intensity or vigorous-intensity exercise each week. This could be brisk walking, biking, or water aerobics.  ? Stretching and doing strength exercises, such as yoga or weightlifting, at least 2 times a week.  · Take medicines as told by your health care provider.  · Do not use any products that contain nicotine or tobacco, such as cigarettes and e-cigarettes. If you need help quitting, ask your health care provider.  · Work with a counselor or diabetes educator to identify strategies to manage stress and any emotional and social challenges.  Questions to ask a health care provider  · Do I need to meet with a diabetes educator?  · Do I need to meet with a dietitian?  · What number can I call if I have questions?  · When are the best times to check my blood glucose?  Where to find more  information:  · American Diabetes Association: diabetes.org  · Academy of Nutrition and Dietetics: www.eatright.org  · National Institute of Diabetes and Digestive and Kidney Diseases (NIH): www.niddk.nih.gov  Summary  · A healthy meal plan will help you control your blood glucose and maintain a healthy lifestyle.  · Working with a diet and nutrition specialist (dietitian) can help you make a meal plan that is best for you.  · Keep in mind that carbohydrates (carbs) and alcohol have immediate effects on your blood glucose levels. It is important to count carbs and to use alcohol carefully.  This information is not intended to   replace advice given to you by your health care provider. Make sure you discuss any questions you have with your health care provider.  Document Released: 11/30/2004 Document Revised: 10/03/2016 Document Reviewed: 04/09/2016  Elsevier Interactive Patient Education © 2019 Elsevier Inc.

## 2018-05-15 NOTE — Assessment & Plan Note (Signed)
Chronic, ongoing.  A1C 6.6% today.  Continue current medication regimen and return in 3 months.

## 2018-05-15 NOTE — Assessment & Plan Note (Signed)
Chronic, ongoing.  Continue current medication regimen.  Lipid panel today. 

## 2018-05-15 NOTE — Assessment & Plan Note (Signed)
Chronic, stable with current regimen.  Continue current medications and recommended patient keep BP log at home to review at next visit.  May consider reduction of Amlodipine if continued below goal BP.

## 2018-05-15 NOTE — Progress Notes (Signed)
BP (!) 100/56   Pulse 87   Temp 97.6 F (36.4 C) (Oral)   Ht 4\' 11"  (1.499 m)   Wt 161 lb (73 kg)   LMP  (LMP Unknown)   SpO2 97%   BMI 32.52 kg/m    Subjective:    Patient ID: Kara Leach, female    DOB: 03-30-1950, 68 y.o.   MRN: [id number]  HPI: Kara Leach is a 68 y.o. female  Chief Complaint  Patient presents with  . Follow-up  . Diabetes  . Hypertension  . Hyperlipidemia   DIABETES A1C November was 6.4%.   Continues on Victoza, Novolog, and Levemir.  Has history of poor tolerance to Metformin and oral medications. Hypoglycemic episodes:no Polydipsia/polyuria: no Visual disturbance: no Chest pain: no Paresthesias: no Glucose Monitoring: yes  Accucheck frequency: BID  Fasting glucose: this morning was 118, on average in 120 range  Post prandial:  Evening: lowest has been 87-89  (eats snack) -- on average 145 -160  Before meals: Taking Insulin?: yes  Long acting insulin: Levemir  Short acting insulin: Novolog Blood Pressure Monitoring: not checking Retinal Examination: Up to Date Foot Exam: Up to Date Pneumovax: Up to Date Influenza: up to date Aspirin: yes   HYPERTENSION / HYPERLIPIDEMIA Is on Enalapril, Norvasc, and Metoprolol for HTN.  Simvastatin for HLD. Satisfied with current treatment? yes Duration of hypertension: chronic BP monitoring frequency: not checking BP range:  BP medication side effects: no Duration of hyperlipidemia: chronic Cholesterol medication side effects: no Cholesterol supplements: none Medication compliance: good compliance Aspirin: yes Recent stressors: no Recurrent headaches: no Visual changes: no Palpitations: no Dyspnea: no Chest pain: no Lower extremity edema: no Dizzy/lightheaded: no  Relevant past medical, surgical, family and social history reviewed and updated as indicated. Interim medical history since our last visit reviewed. Allergies and medications reviewed and updated.  Review of Systems    Constitutional: Negative for activity change, appetite change, diaphoresis, fatigue and fever.  Respiratory: Negative for cough, chest tightness and shortness of breath.   Cardiovascular: Negative for chest pain, palpitations and leg swelling.  Gastrointestinal: Negative for abdominal distention, abdominal pain, constipation, diarrhea, nausea and vomiting.  Endocrine: Negative for cold intolerance, heat intolerance, polydipsia, polyphagia and polyuria.  Neurological: Negative for dizziness, syncope, weakness, light-headedness, numbness and headaches.  Psychiatric/Behavioral: Negative.     Per HPI unless specifically indicated above     Objective:    BP (!) 100/56   Pulse 87   Temp 97.6 F (36.4 C) (Oral)   Ht 4\' 11"  (1.499 m)   Wt 161 lb (73 kg)   LMP  (LMP Unknown)   SpO2 97%   BMI 32.52 kg/m   Wt Readings from Last 3 Encounters:  05/15/18 161 lb (73 kg)  03/14/18 162 lb 3.2 oz (73.6 kg)  03/07/18 159 lb (72.1 kg)    Physical Exam Vitals signs and nursing note reviewed.  Constitutional:      Appearance: She is well-developed.  HENT:     Head: Normocephalic.  Eyes:     General:        Right eye: No discharge.        Left eye: No discharge.     Conjunctiva/sclera: Conjunctivae normal.     Pupils: Pupils are equal, round, and reactive to light.  Neck:     Musculoskeletal: Normal range of motion and neck supple.     Thyroid: No thyromegaly.     Vascular: No carotid bruit or JVD.  Cardiovascular:     Rate and Rhythm: Normal rate and regular rhythm.     Heart sounds: Normal heart sounds. No murmur. No gallop.   Pulmonary:     Effort: Pulmonary effort is normal.     Breath sounds: Normal breath sounds.  Abdominal:     General: Bowel sounds are normal.     Palpations: Abdomen is soft.  Musculoskeletal:     Right lower leg: No edema.     Left lower leg: No edema.  Lymphadenopathy:     Cervical: No cervical adenopathy.  Skin:    General: Skin is warm and dry.   Neurological:     Mental Status: She is alert and oriented to person, place, and time.  Psychiatric:        Mood and Affect: Mood normal.        Behavior: Behavior normal.        Thought Content: Thought content normal.        Judgment: Judgment normal.     Results for orders placed or performed during the hospital encounter of 04/23/18  Surgical pathology  Result Value Ref Range   SURGICAL PATHOLOGY      Surgical Pathology CASE: [id number] PATIENT: [name] Surgical Pathology Report     SPECIMEN SUBMITTED: A.  Breast, left, LOQ  CLINICAL HISTORY: Possible distortion  PRE-OPERATIVE DIAGNOSIS: R/O radial scar, IMC or post biopsy change formalin 1:35pm  POST-OPERATIVE DIAGNOSIS: None provided.     DIAGNOSIS: A. BREAST, LEFT LOWER OUTER QUADRANT; STEREOTACTIC CORE BIOPSY: - BENIGN FIBROEPITHELIAL LESION WITH STROMAL SCLEROSIS. - NEGATIVE FOR ATYPICAL PROLIFERATIVE BREAST DISEASE.  Comment: Sections display a fibroepithelial proliferation with sclerosis, usual ductal hyperplasia, and focal areas of apocrine and fibroadenomatoid change.  The histologic differential diagnosis could include a complex sclerosing lesion (radial scar) or complex fibroadenoma.  There is no evidence of atypical hyperplasia, carcinoma in situ, or malignancy. Clinical and radiographic correlation recommended.  GROSS DESCRIPTION: A. Labeled: Left breast stereotactic distor tion, lower outer quadrant Received: in a formalin-filled [place] collection device Accompanying specimen radiograph: No Time/Date in fixative: 1325 on 04/23/2018 Cold ischemic time: Less than 1 hour Total fixation time: 8 hours Core pieces: Multiple Measurement: Aggregate 1.7 x 1.0 x 0.3 cm Description / comments: Fibrofatty cores and its fragments Inked: Black Entirely submitted in A1-A3.      Final Diagnosis performed by Allena Napoleon, MD.   Electronically signed 04/24/2018 12:13:18PM The electronic  signature indicates that the named Attending Pathologist has evaluated the specimen  Technical component performed at [facility], [address], [place], [place] [address] Lab: [phone] Dir: Rush Farmer, MD, MMM  Professional component performed at [facility], [facility], [place], [place], [place] [address] Lab: [phone] Dir: Dellia Nims. Rubinas, MD       Assessment & Plan:   Problem List Items Addressed This Visit      Cardiovascular and Mediastinum   Hypertensive heart/kidney disease without HF and with CKD stage III (HCC)    Chronic, stable with current regimen.  Continue current medications and recommended patient keep BP log at home to review at next visit.  May consider reduction of Amlodipine if continued below goal BP.        Relevant Orders   Bayer DCA Hb A1c Waived   LP+ALT+AST [place], [place]     Endocrine   Hyperlipidemia associated with type 2 diabetes mellitus (HCC)    Chronic, ongoing.  Continue current medication regimen.  Lipid panel today.  Relevant Orders   Bayer DCA Hb A1c Waived   LP+ALT+AST [place], Waived   Controlled type 2 diabetes mellitus with chronic kidney disease, with long-term current use of insulin (HCC) - Primary    Chronic, ongoing.  A1C 6.6% today.  Continue current medication regimen and return in 3 months.      Relevant Orders   Bayer DCA Hb A1c Waived   LP+ALT+AST Pic[place], [place]    Other Visit Diagnoses    Postmenopausal estrogen deficiency       Bone DEXA ordered   Relevant Orders   DG Bone Density       Follow up plan: Return in about 3 months (around 08/13/2018) for T2DM, HTN/HLD.

## 2018-05-16 LAB — LP+ALT+AST PICCOLO, WAIVED
ALT (SGPT) Piccolo, Waived: 25 U/L (ref 10–47)
AST (SGOT) Piccolo, Waived: 45 U/L — ABNORMAL HIGH (ref 11–38)
Chol/HDL Ratio Piccolo,Waive: 3 mg/dL
Cholesterol Piccolo, Waived: 141 mg/dL (ref <200)
HDL Chol Piccolo, Waived: 48 mg/dL — ABNORMAL LOW (ref >59)
LDL Chol Calc Piccolo Waived: 39 mg/dL (ref <100)
TRIGLYCERIDES PICCOLO,WAIVED: 272 mg/dL — AB (ref <150)
VLDL Chol Calc Piccolo,Waive: 54 mg/dL — ABNORMAL HIGH (ref <30)

## 2018-05-16 LAB — BAYER DCA HB A1C WAIVED: HB A1C (BAYER DCA - WAIVED): 6.6 % (ref <7.0)

## 2018-05-18 DIAGNOSIS — C801 Malignant (primary) neoplasm, unspecified: Secondary | ICD-10-CM

## 2018-05-18 HISTORY — DX: Malignant (primary) neoplasm, unspecified: C80.1

## 2018-05-20 ENCOUNTER — Other Ambulatory Visit: Payer: Self-pay

## 2018-05-20 ENCOUNTER — Ambulatory Visit: Payer: Medicare HMO | Admitting: General Surgery

## 2018-05-20 ENCOUNTER — Encounter: Payer: Self-pay | Admitting: General Surgery

## 2018-05-20 VITALS — BP 132/75 | HR 94 | Temp 97.3°F | Resp 16 | Ht 59.0 in | Wt 159.2 lb

## 2018-05-20 DIAGNOSIS — N6489 Other specified disorders of breast: Secondary | ICD-10-CM | POA: Diagnosis not present

## 2018-05-20 NOTE — Progress Notes (Signed)
Patient ID: Kara Leach, female   DOB: 03-Feb-1951, 68 y.o.   MRN: [id number]  Chief Complaint  Patient presents with  . Other    mammogram     HPI Kara Leach is a 68 y.o. female.  Here today for follow up Left breast biopsy. Patient states she has bruising from the biopsy.  No skin changes or nipple drainage.   The patient was last seen in the office in April 2018 when she underwent vacuum biopsy of a focal area of distortion in the left breast, 6 o'clock position. HPI  Past Medical History:  Diagnosis Date  . Allergy   . Arthritis    Osteoarthritis  . Chronic kidney disease   . Depression   . Diabetes mellitus without complication ([place]) 0630  . Diabetic neuropathy (HCC)    feet  . Diverticulitis 2017  . GERD (gastroesophageal reflux disease)   . Headache    sinus  . Hyperlipidemia   . Hypertension   . Last menstrual period (LMP) > 10 days ago 1986  . Obesity   . Renal insufficiency    Stage 2 kidney disease  . Seizures ([place])    due to high blood sugars. last time = 09/2014  . Sleep apnea    has CPAP, can't tolerate  . Wears hearing aid    right side  . Wrist weakness    left - after fracture and repair    Past Surgical History:  Procedure Laterality Date  . ABDOMINAL HYSTERECTOMY    . APPENDECTOMY    . BREAST BIOPSY Left 07/03/2016   benign, fibrocystic changes with calcifications, usual ductal hyperplasia.   Marland Kitchen BREAST BIOPSY Left 04/23/2018   affirm stereo/coil clip/path pending  . CHOLECYSTECTOMY  2012  . COLONOSCOPY WITH PROPOFOL N/A 12/01/2015   Procedure: COLONOSCOPY WITH PROPOFOL;  Surgeon: [name], MD;  Location: [facility];  Service: Endoscopy;  Laterality: N/A;  Diabetic - insulin Sleep apnea  . PATELLA FRACTURE SURGERY Left   . WRIST FRACTURE SURGERY Left    Steel plate    Family History  Problem Relation Age of Onset  . Diabetes Mother   . Heart disease Mother   . Hypertension Mother   . Diabetes Sister   . Cancer  Father        stomach  . Arthritis Sister   . Stroke Sister   . Hypertension Sister   . Breast cancer Maternal Aunt 67       great aunt    Social History Social History   Tobacco Use  . Smoking status: Never Smoker  . Smokeless tobacco: Never Used  Substance Use Topics  . Alcohol use: Not Currently    Alcohol/week: 0.0 standard drinks    Comment: occasionally holiday, weddings  . Drug use: No    Allergies  Allergen Reactions  . Ibuprofen Other (See Comments)    "ringing in ears"  . Strawberry Extract Swelling    Lips swell and rash over body    Current Outpatient Medications  Medication Sig Dispense Refill  . acetaminophen (TYLENOL ARTHRITIS PAIN) 650 MG CR tablet Take 650 mg by mouth every 8 (eight) hours as needed for pain.    Marland Kitchen allopurinol (ZYLOPRIM) 300 MG tablet Take 1 tablet (300 mg total) by mouth daily. 90 tablet 3  . amLODipine (NORVASC) 5 MG tablet Take 1 tablet (5 mg total) by mouth daily. 90 tablet 3  . aspirin EC 81 MG tablet Take 81 mg by mouth  daily.    . BD PEN NEEDLE NANO U/F 32G X 4 MM MISC USE EVERY MORNING 100 each 12  . Calcium Carb-Cholecalciferol (CALCIUM 600 + D PO) Take 1 tablet by mouth 2 (two) times daily.    . enalapril (VASOTEC) 2.5 MG tablet TAKE 1 TABLET BY MOUTH DAILY 90 tablet 2  . Famotidine (PEPCID PO) Take by mouth 2 (two) times daily.    . fluticasone (FLONASE) 50 MCG/ACT nasal spray PLACE 2 SPRAYS INTO BOTH NOSTRILS DAILY AS NEEDED FOR ALLERGIES. 16 g 12  . Insulin Detemir (LEVEMIR FLEXTOUCH) 100 UNIT/ML Pen INJECT 60 UNITS INTO THE SKIN DAILY AT 10 PM. 18 pen 3  . liraglutide (VICTOZA) 18 MG/3ML SOPN ADMINISTER 1.2MG  UNDER THE SKIN DAILY 3 pen 12  . metoprolol succinate (TOPROL-XL) 25 MG 24 hr tablet Take 1 tablet (25 mg total) by mouth daily. 90 tablet 3  . NOVOLOG FLEXPEN 100 UNIT/ML FlexPen INJECT 15U UNDER THE SKIN 3 TIMES DAILY WITH MEALS 15 pen 12  . ONETOUCH VERIO test strip USE TO CHECK BLOOD SUGAR DAILY 100 each 12  .  sertraline (ZOLOFT) 100 MG tablet TAKE 1 TABLET (100 MG TOTAL) EVERY MORNING BY MOUTH. 90 tablet 0  . simvastatin (ZOCOR) 40 MG tablet Take 1 tablet (40 mg total) by mouth at bedtime. 90 tablet 2  . Specialty Vitamins Products (VITA-RX DIABETIC VITAMIN) CAPS Take 1 packet by mouth daily. Diabetic Vitamins - 6 or 7 in 1 pack     No current facility-administered medications for this visit.     Review of Systems Review of Systems  Constitutional: Negative.   Respiratory: Negative.   Cardiovascular: Negative.     Blood pressure 132/75, pulse 94, temperature (!) 97.3 F (36.3 C), temperature source Temporal, resp. rate 16, height 4\' 11"  (1.499 m), weight 159 lb 3.2 oz (72.2 kg), SpO2 97 %.  Physical Exam Physical Exam Exam conducted with a chaperone present.  Constitutional:      Appearance: She is well-developed.  Eyes:     General: No scleral icterus.    Conjunctiva/sclera: Conjunctivae normal.  Neck:     Musculoskeletal: Normal range of motion.  Cardiovascular:     Rate and Rhythm: Normal rate and regular rhythm.     Heart sounds: Normal heart sounds.  Pulmonary:     Effort: Pulmonary effort is normal.     Breath sounds: Normal breath sounds.  Chest:     Breasts:        Right: No inverted nipple, mass, nipple discharge, skin change or tenderness.        Left: No inverted nipple, mass, nipple discharge, skin change or tenderness.    Lymphadenopathy:     Cervical: No cervical adenopathy.  Skin:    General: Skin is warm and dry.  Neurological:     Mental Status: She is alert and oriented to person, place, and time.     Data Reviewed July 03, 2016 biopsy FIBROCYSTIC CHANGES WITH CALCIFICATIONS. - USUAL DUCTAL HYPERPLASIA. - THERE IS NO EVIDENCE OF MALIGNANCY.  Postbiopsy mammogram confirmed there was correlation between the mammographic and ultrasound findings.  April 23, 2018 biopsy: DIAGNOSIS:  A. BREAST, LEFT LOWER OUTER QUADRANT; STEREOTACTIC CORE BIOPSY:   - BENIGN FIBROEPITHELIAL LESION WITH STROMAL SCLEROSIS.  - NEGATIVE FOR ATYPICAL PROLIFERATIVE BREAST DISEASE.   Comment:  Sections display a fibroepithelial proliferation with sclerosis, usual  ductal hyperplasia, and focal areas of apocrine and fibroadenomatoid  change. The histologic differential diagnosis could include a complex  sclerosing lesion (radial scar) or complex fibroadenoma. There is no  evidence of atypical hyperplasia, carcinoma in situ, or malignancy.  Clinical and radiographic correlation recommended.   Assessment Focal area in the left breast 6 o'clock position with biopsies x2 between 2018 and 2020.  Recent studies suggesting possibility of radial scar.  Plan  We will plan for wire localization due to the hematoma evident on today's exam and the potential for clip migration.  Post surgery recovery routine reviewed.  HPI, Physical Exam, Assessment and Plan have been scribed under the direction and in the presence of Robert Bellow, MD. Jonnie Finner, CMA  I have completed the exam and reviewed the above documentation for accuracy and completeness.  I agree with the above.  Haematologist has been used and any errors in dictation or transcription are unintentional.  Hervey Ard, M.D., F.A.C.S.  The patient is scheduled for surgery at [facility] with Dr Bary Castilla on 06/02/18. She will pre admit at the hospital. She is aware of date and instructions.  Documented by Caryl-Lyn Otis Brace LPN  Forest Gleason Aftin Lye 05/20/2018, 8:02 PM

## 2018-05-20 NOTE — Patient Instructions (Addendum)
The patient is scheduled for surgery at [facility] with Dr Bary Castilla on 06/02/18. She will pre admit at the hospital. She is aware of date and instructions.

## 2018-05-21 ENCOUNTER — Other Ambulatory Visit: Payer: Self-pay | Admitting: General Surgery

## 2018-05-21 ENCOUNTER — Telehealth: Payer: Self-pay

## 2018-05-21 DIAGNOSIS — N6489 Other specified disorders of breast: Secondary | ICD-10-CM

## 2018-05-21 NOTE — Telephone Encounter (Signed)
The patient is scheduled for pre admit testing at the hospital in the [facility] on 05/27/18 at 2:00 pm. On the day of surgery on 06/02/18 she will report to the [facility] at 7:45 am. I have left a message for the patient to call to get her arrival dates, times, and locations.

## 2018-05-22 ENCOUNTER — Telehealth: Payer: Self-pay | Admitting: *Deleted

## 2018-05-22 NOTE — Telephone Encounter (Signed)
Patient called the office back and was notified per Kara Leach's previous note about pre-admit appointment and arrival time day of surgery.   The patient verbalizes understanding.

## 2018-05-26 ENCOUNTER — Telehealth: Payer: Self-pay

## 2018-05-26 NOTE — Telephone Encounter (Signed)
Call to patient to see about rescheduling her surgery scheduled with Dr Bary Castilla on 06/02/18.   Patient's surgery has been rescheduled at [facility] with Dr. Bary Castilla on 06/09/18.  We will call her with her surgery arrival time and location for her needle location at [facility].   The patient is aware to call the office should she have further questions.

## 2018-05-27 ENCOUNTER — Encounter
Admission: RE | Admit: 2018-05-27 | Discharge: 2018-05-27 | Disposition: A | Discharge status: Home / Self Care | Payer: Medicare HMO | Source: Ambulatory Visit | Attending: General Surgery | Admitting: General Surgery

## 2018-05-27 ENCOUNTER — Telehealth: Payer: Self-pay | Admitting: General Surgery

## 2018-05-27 ENCOUNTER — Other Ambulatory Visit: Payer: Self-pay

## 2018-05-27 DIAGNOSIS — I10 Essential (primary) hypertension: Secondary | ICD-10-CM | POA: Diagnosis not present

## 2018-05-27 DIAGNOSIS — Z01818 Encounter for other preprocedural examination: Secondary | ICD-10-CM | POA: Diagnosis present

## 2018-05-27 HISTORY — DX: Anxiety disorder, unspecified: F41.9

## 2018-05-27 HISTORY — DX: Cerebral infarction, unspecified: I63.9

## 2018-05-27 HISTORY — DX: Peripheral vascular disease, unspecified: I73.9

## 2018-05-27 HISTORY — DX: Malignant (primary) neoplasm, unspecified: C80.1

## 2018-05-27 LAB — CBC
HCT: 38.2 % (ref 36.0–46.0)
Hemoglobin: 12.1 g/dL (ref 12.0–15.0)
MCH: 29.7 pg (ref 26.0–34.0)
MCHC: 31.7 g/dL (ref 30.0–36.0)
MCV: 93.9 fL (ref 80.0–100.0)
Platelets: 270 10*3/uL (ref 150–400)
RBC: 4.07 MIL/uL (ref 3.87–5.11)
RDW: 14.6 % (ref 11.5–15.5)
WBC: 10.2 10*3/uL (ref 4.0–10.5)
nRBC: 0 % (ref 0.0–0.2)

## 2018-05-27 LAB — BASIC METABOLIC PANEL
Anion gap: 11 (ref 5–15)
BUN: 22 mg/dL (ref 8–23)
CALCIUM: 9.4 mg/dL (ref 8.9–10.3)
CO2: 24 mmol/L (ref 22–32)
Chloride: 103 mmol/L (ref 98–111)
Creatinine, Ser: 1.06 mg/dL — ABNORMAL HIGH (ref 0.44–1.00)
GFR calc non Af Amer: 55 mL/min — ABNORMAL LOW (ref >60)
Glucose, Bld: 145 mg/dL — ABNORMAL HIGH (ref 70–99)
Potassium: 4.1 mmol/L (ref 3.5–5.1)
Sodium: 138 mmol/L (ref 135–145)

## 2018-05-27 NOTE — Telephone Encounter (Signed)
I have called patient, per Caryl-lyn's ([facility]) request to advise her of the arrival time for surgery that is scheduled with Dr Bary Castilla on 06/09/18. No answer. I have left a message on the patient's voicemail [phone]) to call our office.  Patient will need to arrive at [facility] at 8:30am on 06/09/18 (day of surgery). Patient does not need to call the day before surgery for arrival time. Patient also should be NPO after midnight.   I will follow up and document in chart, once information is given to patient.

## 2018-05-27 NOTE — Patient Instructions (Signed)
INSTRUCTIONS FOR SURGERY     Your surgery is scheduled for: Monday, June 09, 2018       To find out your arrival time for the day of surgery,          please call [phone] between 1 pm and 3 pm on : N/A  PLEASE ARRIVE IN MAMMOGRAPHY BY 8:15 AM. IF THIS CHANGES, THEY WILL CALL YOU.     When you arrive for surgery, report to the [place].       Do NOT stop on the first floor to register.    REMEMBER: Instructions that are not followed completely may result in serious medical risk,  up to and including death, or upon the discretion of your surgeon and anesthesiologist,            your surgery may need to be rescheduled.  __X__ 1. Do not eat food after midnight the night before your procedure.                    No gum, candy, lozenger, tic tacs, tums or hard candies.                  ABSOLUTELY NOTHING SOLID IN YOUR MOUTH AFTER MIDNIGHT                    You may drink unlimited clear liquids up to 2 hours before you are scheduled to arrive for surgery.                   Do not drink anything within those 2 hours unless you need to take medicine, then take the                   smallest amount you need.  Clear liquids include:  water, apple juice without pulp,                   any flavor Gatorade, Black coffee, black tea.  Sugar may be added but no dairy/ honey /lemon.                        Broth and jello is not considered a clear liquid.  __x__  2. On the morning of surgery, please brush your teeth with toothpaste and water. You may rinse with                  mouthwash if you wish but DO NOT SWALLOW TOOTHPASTE OR MOUTHWASH  __X___3. NO alcohol for 24 hours before or after surgery.  __x___ 4.  Do NOT smoke or use e-cigarettes for 24 HOURS PRIOR TO SURGERY.                      DO NOT use any chewable tobacco products for at least 6 hours prior to surgery.  __x___ 5. If you start any new medication  after this appointment and prior to surgery, please                   Bring it with you on the day of  surgery.  ___x__ 6. Notify your doctor if there is any change in your medical condition, such as fever, infection, vomitting, diarrhea.  __x___ 7.  USE the CHG SOAP as instructed, the night before surgery and the day of surgery.                   Once you have washed with this soap, do NOT use any of the following: Powders, perfumes or lotions.                   Please do not wear make up, hairpins, clips or nail polish. You MAY NOT  wear deodorant.                   Men may shave their face and neck.  Women need to shave 48 hours prior to surgery.                   DO NOT wear ANY jewelry on the day of surgery. If there are rings that are too tight to remove easily,                     please address this prior to the surgery day. Piercings need to be removed.                                                                     NO METAL ON YOUR BODY.                    Do NOT bring any valuables.                      If you came to Pre-Admit testing then you will not need license, insurance card or credit card.                      If you will be staying overnight, please either leave your things in the car or have your family be                     responsible for these items.                     [place] IS NOT RESPONSIBLE FOR BELONGINGS OR VALUABLES.  ___X__ 8. DO NOT wear contact lenses on surgery day.  You may not have dentures,                     Hearing aides, contacts or glasses in the operating room. These items can be                    Placed in the Recovery Room to receive immediately after surgery.  __x___ 9. IF YOU ARE SCHEDULED TO GO HOME ON THE SAME DAY, YOU MUST                   Have someone to drive you home and to stay with you  for the first 24 hours.                    Have an arrangement prior  to arriving on surgery day.  ___x__ 10. Take the following medications  on the morning of surgery with a sip of water:                              1.ALLOPURINOL                     2.AMLODIPINE                     3.PEPCID                     4.METOPROLOL                     5.ZOLOFT                     6.FLONASE  _____ 11.  Follow any instructions provided to you by your surgeon.                        Such as enema, clear liquid bowel prep  __X__  12. STOP  ASPIRIN AS OF: TODAY                       THIS INCLUDES BC POWDERS / GOODIES POWDER  __x___ 13. STOP Anti-inflammatories as of: TODAY                      This includes IBUPROFEN / MOTRIN / ADVIL / ALEVE/ NAPROXYN                    YOU MAY TAKE TYLENOL ANY TIME PRIOR TO SURGERY.  _X____ 14.  Stop supplements until after surgery.                     This includes:CALCIUM PLUS D / VITAMINS - DIABETIC                  You may continue taking Vitamin B12 / Vitamin D3 but do not take on the morning of surgery.  _____ 15. Bring your CPAP machine into preop with you on the morning of surgery.  ______16.  Stop Metformin 2 full days prior to surgery.  Stop on: N/A                     TAKE 1/2 OF USUAL INSULIN DOSE ON THE EVENING PRIOR TO SURGERY.                         TAKE HALF OF LEVEMIR THAT NIGHT AND TAKE ALL OF  VICTOZA NIGHT BEFORE.                     Do NOT take any diabetes medications on surgery day. DO NOT TAKE NOVOLOG  ______17.  Continue to take the following medications but do not take on the morning of surgery:                       ENALAPRIL  ______18. If staying overnight, please have appropriate shoes to wear to be able to walk around the unit.                   Wear clean and comfortable clothing to the hospital.  BRING A SPORTS BRA TO [place] CONTINUE TO TAKE YOUR EVENING MEDICATIONS AS USUAL. THIS INCLUDES:    SIMVASTATIN / VICTOZA TYLENOL

## 2018-05-29 ENCOUNTER — Other Ambulatory Visit: Payer: Self-pay | Admitting: Unknown Physician Specialty

## 2018-05-30 ENCOUNTER — Other Ambulatory Visit: Payer: Self-pay | Admitting: Nurse Practitioner

## 2018-05-30 ENCOUNTER — Telehealth: Payer: Self-pay | Admitting: Nurse Practitioner

## 2018-05-30 MED ORDER — INSULIN ASPART 100 UNIT/ML FLEXPEN: PEN_INJECTOR | SUBCUTANEOUS | 12 refills | Status: DC

## 2018-05-30 NOTE — Telephone Encounter (Signed)
Copied from [place] [id number]. Topic: Quick Communication - Rx Refill/Question >> May 30, 2018  4:06 PM Rayann Heman wrote: Medication:NOVOLOG FLEXPEN 100 UNIT/ML FlexPen [[id number]]  needs 90 day supply   Has the patient contacted their pharmacy?yes Preferred Pharmacy (with phone number or street name):[id number] - [place], [address] [phone] (Phone) [phone] (Fax)

## 2018-05-30 NOTE — Progress Notes (Signed)
Novolog flexpen refill sent

## 2018-06-02 ENCOUNTER — Ambulatory Visit: Payer: Medicare HMO

## 2018-06-04 ENCOUNTER — Telehealth: Payer: Self-pay | Admitting: *Deleted

## 2018-06-04 NOTE — Telephone Encounter (Signed)
Message left for patient to call the office.   Due to recent COVID-19 restrictions, elective surgeries will need to be postponed. Surgery for 06-09-18 will need to be cancelled.   The patient will be contacted once restrictions have been lifted regarding rescheduling.   Patient will not need to Pre-admit again. She will follow same instructions previously given.

## 2018-06-05 ENCOUNTER — Telehealth: Payer: Self-pay | Admitting: *Deleted

## 2018-06-05 NOTE — Telephone Encounter (Signed)
C/x surgery, will call back to reschedule

## 2018-06-06 NOTE — Telephone Encounter (Signed)
Talked to Remo Lipps (son). He will pass the message to his mother that we will have to c/ x her surgery. Will call back to scheduled.

## 2018-06-09 ENCOUNTER — Ambulatory Visit: Payer: Medicare HMO

## 2018-06-11 ENCOUNTER — Other Ambulatory Visit: Payer: Self-pay | Admitting: Nurse Practitioner

## 2018-06-18 ENCOUNTER — Other Ambulatory Visit: Payer: Self-pay | Admitting: Nurse Practitioner

## 2018-07-03 DIAGNOSIS — R69 Illness, unspecified: Secondary | ICD-10-CM | POA: Diagnosis not present

## 2018-07-07 ENCOUNTER — Other Ambulatory Visit: Payer: Self-pay | Admitting: Unknown Physician Specialty

## 2018-07-27 ENCOUNTER — Other Ambulatory Visit: Payer: Self-pay | Admitting: Nurse Practitioner

## 2018-07-29 ENCOUNTER — Telehealth: Payer: Self-pay | Admitting: *Deleted

## 2018-07-29 NOTE — Telephone Encounter (Signed)
Message left for patient to call the office.   We need to go ahead and schedule patient for wire localization and open biopsy left breast at [facility].   Patient previously pre-admitted on 05-27-18. We will need to check with pre-admit to see if this would need to be repeated.   The patient will need to be tested for COVID-19 prior to surgery.   She will also require a pre-op with Dr. Bary Castilla. See if she can come on 07-31-18 for this.

## 2018-07-29 NOTE — Telephone Encounter (Signed)
Patient called the office back and wishes to get surgery scheduled for 08-13-18 at [facility] with Dr. Bary Castilla.   The patient is aware she will need to go for COVID-19 testing on 08-08-18 at the [facility] drive thru.   Patient recently pre-admitted on 05-27-18 and we will need to find out if she will need to repeat this or not.   Patient will come see Dr. Bary Castilla for a pre-op visit on Thursday, 07-31-18 at 10 am.  I will have Caryl-Lyn complete the rest of the scheduling process due to the office closing early today. Patient aware if we don't call her tomorrow that we will review details at time of pre-op appointment.

## 2018-07-31 ENCOUNTER — Encounter: Payer: Self-pay | Admitting: General Surgery

## 2018-07-31 ENCOUNTER — Other Ambulatory Visit: Payer: Self-pay

## 2018-07-31 ENCOUNTER — Ambulatory Visit (INDEPENDENT_AMBULATORY_CARE_PROVIDER_SITE_OTHER): Payer: Medicare HMO | Admitting: General Surgery

## 2018-07-31 VITALS — BP 143/73 | HR 90 | Temp 97.2°F | Resp 14 | Ht 59.0 in | Wt 160.0 lb

## 2018-07-31 DIAGNOSIS — N6489 Other specified disorders of breast: Secondary | ICD-10-CM

## 2018-07-31 NOTE — Progress Notes (Signed)
Patient ID: Kara Leach, female   DOB: Jul 19, 1950, 68 y.o.   MRN: [id number]  Chief Complaint  Patient presents with  . Follow-up    HPI Kara Leach is a 68 y.o. female here for her pre op appointment for left breast surgery scheduled for 08/13/18. She reports that she is doing well.  HPI  Past Medical History:  Diagnosis Date  . Allergy   . Anxiety   . Arthritis    Osteoarthritis  . Cancer ([place]) 05/2018   possibly on left breast but not sure  . Chronic kidney disease   . Depression   . Diabetes mellitus without complication ([place]) 9509  . Diabetic neuropathy (HCC)    feet  . Diverticulitis 2017  . GERD (gastroesophageal reflux disease)   . Headache    sinus and migraines on occasion  . Hyperlipidemia   . Hypertension   . Last menstrual period (LMP) > 10 days ago 1986  . Obesity   . Peripheral vascular disease (HCC)    neuropathy in hands and feet d/t diabetes  . Renal insufficiency    Stage 2 kidney disease  . Seizures ([place])    due to high blood sugars. last time = 09/2014  . Sleep apnea    has CPAP, can't tolerate  . Stroke ([place]) 1980   minor. left side remains weaker than right  . Wears hearing aid    right side  . Wrist weakness    left - after fracture and repair    Past Surgical History:  Procedure Laterality Date  . ABDOMINAL HYSTERECTOMY  1986  . APPENDECTOMY  1964  . BREAST BIOPSY Left 07/03/2016   benign, fibrocystic changes with calcifications, usual ductal hyperplasia.   Marland Kitchen BREAST BIOPSY Left 04/23/2018   affirm stereo/coil clip/path pending  . CHOLECYSTECTOMY  2012  . COLONOSCOPY WITH PROPOFOL N/A 12/01/2015   Procedure: COLONOSCOPY WITH PROPOFOL;  Surgeon: [name], MD;  Location: [facility];  Service: Endoscopy;  Laterality: N/A;  Diabetic - insulin Sleep apnea  . PATELLA FRACTURE SURGERY Left    1. had to recenter kneecap, 2 and 3 surgeries were arthroscopies  . WRIST FRACTURE SURGERY Left 2005   Steel plate    Family  History  Problem Relation Age of Onset  . Diabetes Mother   . Heart disease Mother   . Hypertension Mother   . Diabetes Sister   . Cancer Father        stomach  . Arthritis Sister   . Stroke Sister   . Hypertension Sister   . Breast cancer Maternal Aunt 61       great aunt    Social History Social History   Tobacco Use  . Smoking status: Never Smoker  . Smokeless tobacco: Never Used  Substance Use Topics  . Alcohol use: Not Currently    Alcohol/week: 0.0 standard drinks    Comment: occasionally holiday, weddings  . Drug use: No    Allergies  Allergen Reactions  . Strawberry Extract Swelling    Lips swell and rash/whelps over body Difficulty breathing  . Ibuprofen Other (See Comments)    "ringing in ears", dizziness Also, Advil / Aleve / Motrin    Current Outpatient Medications  Medication Sig Dispense Refill  . acetaminophen (TYLENOL ARTHRITIS PAIN) 650 MG CR tablet Take 650 mg by mouth every 8 (eight) hours as needed for pain.    Marland Kitchen allopurinol (ZYLOPRIM) 300 MG tablet Take 1 tablet (300 mg total) by mouth  daily. 90 tablet 3  . amLODipine (NORVASC) 5 MG tablet Take 1 tablet (5 mg total) by mouth daily. 90 tablet 3  . aspirin EC 81 MG tablet Take 81 mg by mouth daily.    . BD PEN NEEDLE NANO U/F 32G X 4 MM MISC USE EVERY MORNING 100 each 12  . Calcium Carb-Cholecalciferol (CALCIUM 600 + D PO) Take 1 tablet by mouth 2 (two) times daily.    . enalapril (VASOTEC) 2.5 MG tablet TAKE 1 TABLET BY MOUTH DAILY 90 tablet 1  . Famotidine (PEPCID PO) Take by mouth 2 (two) times daily.    . fluticasone (FLONASE) 50 MCG/ACT nasal spray PLACE 2 SPRAYS INTO BOTH NOSTRILS DAILY AS NEEDED FOR ALLERGIES. 16 g 12  . insulin aspart (NOVOLOG FLEXPEN) 100 UNIT/ML FlexPen INJECT 15U UNDER THE SKIN 3 TIMES DAILY WITH MEALS 15 pen 12  . LEVEMIR FLEXTOUCH 100 UNIT/ML Pen INJECT 60 UNITS INTO THE SKIN DAILY AT 10 PM. 15 mL 1  . liraglutide (VICTOZA) 18 MG/3ML SOPN ADMINISTER 1.2MG  UNDER THE  SKIN DAILY 3 pen 12  . metoprolol succinate (TOPROL-XL) 25 MG 24 hr tablet Take 1 tablet (25 mg total) by mouth daily. 90 tablet 3  . ONETOUCH VERIO test strip USE TO CHECK BLOOD SUGAR DAILY 100 each 12  . sertraline (ZOLOFT) 100 MG tablet TAKE 1 TABLET (100 MG TOTAL) EVERY MORNING BY MOUTH. 60 tablet 0  . simvastatin (ZOCOR) 40 MG tablet TAKE 1 TABLET BY MOUTH EVERYDAY AT BEDTIME 90 tablet 1  . Specialty Vitamins Products (VITA-RX DIABETIC VITAMIN) CAPS Take 1 packet by mouth daily. Diabetic Vitamins - 6 or 7 in 1 pack     No current facility-administered medications for this visit.     Review of Systems Review of Systems  Constitutional: Negative.   Respiratory: Negative.   Cardiovascular: Negative.     Blood pressure (!) 143/73, pulse 90, temperature (!) 97.2 F (36.2 C), resp. rate 14, height 4\' 11"  (1.499 m), weight 160 lb (72.6 kg), SpO2 98 %.  Physical Exam Physical Exam Exam conducted with a chaperone present.  Constitutional:      Appearance: She is well-developed.  Eyes:     General: No scleral icterus.    Conjunctiva/sclera: Conjunctivae normal.  Neck:     Musculoskeletal: Neck supple.  Cardiovascular:     Rate and Rhythm: Normal rate and regular rhythm.     Heart sounds: Normal heart sounds.  Pulmonary:     Effort: Pulmonary effort is normal.     Breath sounds: Normal breath sounds.  Chest:     Breasts:        Right: No inverted nipple, mass, nipple discharge, skin change or tenderness.        Left: No inverted nipple, mass, nipple discharge, skin change or tenderness.     Comments: Biopsy changes left breast Lymphadenopathy:     Cervical: No cervical adenopathy.     Upper Body:     Right upper body: No supraclavicular or axillary adenopathy.     Left upper body: No supraclavicular or axillary adenopathy.  Skin:    General: Skin is warm and dry.  Neurological:     Mental Status: She is alert and oriented to person, place, and time.  Psychiatric:         Behavior: Behavior normal.     Data Reviewed April 23, 2018 stereotactic biopsy: DIAGNOSIS:  A. BREAST, LEFT LOWER OUTER QUADRANT; STEREOTACTIC CORE BIOPSY:  - BENIGN FIBROEPITHELIAL LESION  WITH STROMAL SCLEROSIS.  - NEGATIVE FOR ATYPICAL PROLIFERATIVE BREAST DISEASE. April 23, 2018 stereotactic biopsy:  July 03, 2016 biopsy of the same region: Breast, left, needle core biopsy, 5:30 o'clock - FIBROCYSTIC CHANGES WITH CALCIFICATIONS. - USUAL DUCTAL HYPERPLASIA. - THERE IS NO EVIDENCE OF MALIGNANCY. - SEE COMMENT. Microscopic Comment The hyperplastic cells are positive cytokeratin 5/6 and e-cadherin, supporting the above diagnosis.  Assessment Possible radial scar of the left breast.  Plan With the patient undergoing 2 biopsies of the same region within 2 years, and the new findings of possible radial scar, it is elected proceed to excision.  She did develop a hematoma after the last procedure and we will arrange for a wire localization to be sure that the coil marking clip placed at the time of the most recent biopsy was removed.  Follow up as scheduled for breast surgery.  The patient reports she wears a 40 double D bra.  We will arrange to have the appropriate sized chest binder at the time of surgery.  HPI, Physical Exam, Assessment and Plan have been scribed under the direction and in the presence of Robert Bellow, MD  Concepcion Living, LPN  HPI, assessment, plan and physical exam has been scribed under the direction and in the presence of Robert Bellow, MD. Karie Fetch, RN  I have completed the exam and reviewed the above documentation for accuracy and completeness.  I agree with the above.  Haematologist has been used and any errors in dictation or transcription are unintentional.  Hervey Ard, M.D., F.A.C.S.  Forest Gleason  08/01/2018, 8:11 AM

## 2018-07-31 NOTE — Telephone Encounter (Signed)
Patient is aware she is scheduled for surgery at [facility] on 08/13/18. She will arrive at the [place] entrance to go to the [facility] at 8:00 am that morning. She will go for Covid-19 testing at the [facility] on 08/08/18 between 10:30am and 12:30 pm. She will have a phone interview for Pre Admit testing. The patient is aware of date, time, and instructions.

## 2018-08-04 ENCOUNTER — Encounter (HOSPITAL_COMMUNITY): Payer: Medicare HMO

## 2018-08-06 ENCOUNTER — Encounter: Payer: Self-pay | Admitting: Nurse Practitioner

## 2018-08-06 ENCOUNTER — Other Ambulatory Visit: Payer: Self-pay

## 2018-08-06 ENCOUNTER — Ambulatory Visit (INDEPENDENT_AMBULATORY_CARE_PROVIDER_SITE_OTHER): Payer: Medicare HMO | Admitting: Nurse Practitioner

## 2018-08-06 VITALS — BP 120/76 | HR 79 | Temp 98.2°F | Ht 59.0 in | Wt 161.0 lb

## 2018-08-06 DIAGNOSIS — N182 Chronic kidney disease, stage 2 (mild): Secondary | ICD-10-CM | POA: Diagnosis not present

## 2018-08-06 DIAGNOSIS — M1A39X0 Chronic gout due to renal impairment, multiple sites, without tophus (tophi): Secondary | ICD-10-CM | POA: Insufficient documentation

## 2018-08-06 DIAGNOSIS — E785 Hyperlipidemia, unspecified: Secondary | ICD-10-CM | POA: Diagnosis not present

## 2018-08-06 DIAGNOSIS — E559 Vitamin D deficiency, unspecified: Secondary | ICD-10-CM

## 2018-08-06 DIAGNOSIS — N183 Chronic kidney disease, stage 3 unspecified: Secondary | ICD-10-CM

## 2018-08-06 DIAGNOSIS — N6323 Unspecified lump in the left breast, lower outer quadrant: Secondary | ICD-10-CM

## 2018-08-06 DIAGNOSIS — Z794 Long term (current) use of insulin: Secondary | ICD-10-CM | POA: Diagnosis not present

## 2018-08-06 DIAGNOSIS — I1310 Hypertensive heart and chronic kidney disease without heart failure, with stage 1 through stage 4 chronic kidney disease, or unspecified chronic kidney disease: Secondary | ICD-10-CM

## 2018-08-06 DIAGNOSIS — K219 Gastro-esophageal reflux disease without esophagitis: Secondary | ICD-10-CM | POA: Insufficient documentation

## 2018-08-06 DIAGNOSIS — R69 Illness, unspecified: Secondary | ICD-10-CM | POA: Diagnosis not present

## 2018-08-06 DIAGNOSIS — E1169 Type 2 diabetes mellitus with other specified complication: Secondary | ICD-10-CM | POA: Diagnosis not present

## 2018-08-06 DIAGNOSIS — E1122 Type 2 diabetes mellitus with diabetic chronic kidney disease: Secondary | ICD-10-CM | POA: Diagnosis not present

## 2018-08-06 DIAGNOSIS — F324 Major depressive disorder, single episode, in partial remission: Secondary | ICD-10-CM

## 2018-08-06 LAB — LIPID PANEL PICCOLO, WAIVED
Chol/HDL Ratio Piccolo,Waive: 3.1 mg/dL
Cholesterol Piccolo, Waived: 152 mg/dL (ref <200)
HDL Chol Piccolo, Waived: 49 mg/dL — ABNORMAL LOW (ref >59)
LDL Chol Calc Piccolo Waived: 34 mg/dL (ref <100)
Triglycerides Piccolo,Waived: 343 mg/dL — ABNORMAL HIGH (ref <150)
VLDL Chol Calc Piccolo,Waive: 69 mg/dL — ABNORMAL HIGH (ref <30)

## 2018-08-06 LAB — MICROALBUMIN, URINE WAIVED
Creatinine, Urine Waived: 50 mg/dL (ref 10–300)
Microalb, Ur Waived: 10 mg/L (ref 0–19)

## 2018-08-06 LAB — BAYER DCA HB A1C WAIVED: HB A1C (BAYER DCA - WAIVED): 7.2 % — ABNORMAL HIGH (ref <7.0)

## 2018-08-06 NOTE — Assessment & Plan Note (Signed)
Chronic, stable.  Continue current medication regimen, Famotidine, and check Mag level today.   

## 2018-08-06 NOTE — Assessment & Plan Note (Signed)
Chronic, stable.  PHQ =5.  Continue current medication regimen, Sertraline 100 MG daily.

## 2018-08-06 NOTE — Patient Instructions (Signed)
Carbohydrate Counting for Diabetes Mellitus, Adult  Carbohydrate counting is a method of keeping track of how many carbohydrates you eat. Eating carbohydrates naturally increases the amount of sugar (glucose) in the blood. Counting how many carbohydrates you eat helps keep your blood glucose within normal limits, which helps you manage your diabetes (diabetes mellitus). It is important to know how many carbohydrates you can safely have in each meal. This is different for every person. A diet and nutrition specialist (registered dietitian) can help you make a meal plan and calculate how many carbohydrates you should have at each meal and snack. Carbohydrates are found in the following foods:  Grains, such as breads and cereals.  Dried beans and soy products.  Starchy vegetables, such as potatoes, peas, and corn.  Fruit and fruit juices.  Milk and yogurt.  Sweets and snack foods, such as cake, cookies, candy, chips, and soft drinks. How do I count carbohydrates? There are two ways to count carbohydrates in food. You can use either of the methods or a combination of both. Reading "Nutrition Facts" on packaged food The "Nutrition Facts" list is included on the labels of almost all packaged foods and beverages in the [place] It includes:  The serving size.  Information about nutrients in each serving, including the grams (g) of carbohydrate per serving. To use the "Nutrition Facts":  Decide how many servings you will have.  Multiply the number of servings by the number of carbohydrates per serving.  The resulting number is the total amount of carbohydrates that you will be having. Learning standard serving sizes of other foods When you eat carbohydrate foods that are not packaged or do not include "Nutrition Facts" on the label, you need to measure the servings in order to count the amount of carbohydrates:  Measure the foods that you will eat with a food scale or measuring cup, if needed.   Decide how many standard-size servings you will eat.  Multiply the number of servings by 15. Most carbohydrate-rich foods have about 15 g of carbohydrates per serving. ? For example, if you eat 8 oz (170 g) of strawberries, you will have eaten 2 servings and 30 g of carbohydrates (2 servings x 15 g = 30 g).  For foods that have more than one food mixed, such as soups and casseroles, you must count the carbohydrates in each food that is included. The following list contains standard serving sizes of common carbohydrate-rich foods. Each of these servings has about 15 g of carbohydrates:   hamburger bun or  English muffin.   oz (15 mL) syrup.   oz (14 g) jelly.  1 slice of bread.  1 six-inch tortilla.  3 oz (85 g) cooked rice or pasta.  4 oz (113 g) cooked dried beans.  4 oz (113 g) starchy vegetable, such as peas, corn, or potatoes.  4 oz (113 g) hot cereal.  4 oz (113 g) mashed potatoes or  of a large baked potato.  4 oz (113 g) canned or frozen fruit.  4 oz (120 mL) fruit juice.  4-6 crackers.  6 chicken nuggets.  6 oz (170 g) unsweetened dry cereal.  6 oz (170 g) plain fat-free yogurt or yogurt sweetened with artificial sweeteners.  8 oz (240 mL) milk.  8 oz (170 g) fresh fruit or one small piece of fruit.  24 oz (680 g) popped popcorn. Example of carbohydrate counting Sample meal  3 oz (85 g) chicken breast.  6 oz (170 g)   brown rice.  4 oz (113 g) corn.  8 oz (240 mL) milk.  8 oz (170 g) strawberries with sugar-free whipped topping. Carbohydrate calculation 1. Identify the foods that contain carbohydrates: ? Rice. ? Corn. ? Milk. ? Strawberries. 2. Calculate how many servings you have of each food: ? 2 servings rice. ? 1 serving corn. ? 1 serving milk. ? 1 serving strawberries. 3. Multiply each number of servings by 15 g: ? 2 servings rice x 15 g = 30 g. ? 1 serving corn x 15 g = 15 g. ? 1 serving milk x 15 g = 15 g. ? 1 serving  strawberries x 15 g = 15 g. 4. Add together all of the amounts to find the total grams of carbohydrates eaten: ? 30 g + 15 g + 15 g + 15 g = 75 g of carbohydrates total. Summary  Carbohydrate counting is a method of keeping track of how many carbohydrates you eat.  Eating carbohydrates naturally increases the amount of sugar (glucose) in the blood.  Counting how many carbohydrates you eat helps keep your blood glucose within normal limits, which helps you manage your diabetes.  A diet and nutrition specialist (registered dietitian) can help you make a meal plan and calculate how many carbohydrates you should have at each meal and snack. This information is not intended to replace advice given to you by your health care provider. Make sure you discuss any questions you have with your health care provider. Document Released: 03/05/2005 Document Revised: 09/12/2016 Document Reviewed: 08/17/2015 Elsevier Interactive Patient Education  2019 Elsevier Inc.  

## 2018-08-06 NOTE — Assessment & Plan Note (Signed)
Vitamin D level today, continue supplement.

## 2018-08-06 NOTE — Assessment & Plan Note (Addendum)
Chronic, ongoing.  Followed by nephrology.  Urine ALB 10 and A:C 30-300 today.  Continue Enalapril and collaboration with nephrology.  CMP ordered.

## 2018-08-06 NOTE — Assessment & Plan Note (Signed)
Continue to collaborate and review records from Dr. Bary Castilla.

## 2018-08-06 NOTE — Assessment & Plan Note (Signed)
Chronic, stable with current regimen.  Continue current medication regimen.  BP below goal at home and in office today.  CMP ordered.

## 2018-08-06 NOTE — Assessment & Plan Note (Addendum)
Chronic, ongoing.  A1C today 7.2%.  Continue current medication regimen at this time and patient wishes to return to focus on diet and regular walks.  Concern for hypoglycemia if increase in insulin at this time, due to morning BS 110-120.  CCM referral placed.  Return in 3 months, if continued elevation of A1C above goal consider adjusting med regimen.

## 2018-08-06 NOTE — Assessment & Plan Note (Signed)
Reports nephrology placed her on Allopurinol for kidneys.  No history of gout flares that she recalls.  Check uric acid level today and kidney function.

## 2018-08-06 NOTE — Progress Notes (Signed)
BP 120/76   Pulse 79   Temp 98.2 F (36.8 C) (Oral)   Ht 4\' 11"  (1.499 m)   Wt 161 lb (73 kg)   LMP  (LMP Unknown)   SpO2 96%   BMI 32.52 kg/m    Subjective:    Patient ID: Kara Leach, female    DOB: [birth year], 68 y.o.   MRN: [id number]  HPI: Kara Leach is a 68 y.o. female  Chief Complaint  Patient presents with  . Diabetes    f/u  . Hypertension  . Hyperlipidemia   DIABETES Last A1C was 6.6%.  Continues on Novolog pre meal and Levemir + Victoza 1.2 MG daily. Endorses she has not followed diet as well and has not been walking as much due to Covid 19 pandemic.  Discussed CCM team with patient and she is interested in referral.   Hypoglycemic episodes:no Polydipsia/polyuria: no Visual disturbance: no Chest pain: no Paresthesias: no Glucose Monitoring: yes  Accucheck frequency: Daily  Fasting glucose: 110 to 120's  Post prandial:  Evening: 150's  Before meals: Taking Insulin?: yes  Long acting insulin:Levemir 60 units at night  Short acting insulin: Novolog 16 units before meals Blood Pressure Monitoring: daily Retinal Examination: Up to Date Foot Exam: Up to Date Diabetic Education: Completed Pneumovax: Up to Date Influenza: Up to Date Aspirin: yes   HYPERTENSION / HYPERLIPIDEMIA Continues on Amlodipine, Enalapril, Metoprolol, and ASA + Simvastatin. Satisfied with current treatment? yes Duration of hypertension: chronic BP monitoring frequency: daily BP range: 120-130/70-80's BP medication side effects: no Duration of hyperlipidemia: chronic Cholesterol medication side effects: no Cholesterol supplements: none Medication compliance: good compliance Aspirin: yes Recent stressors: no Recurrent headaches: no Visual changes: no Palpitations: no Dyspnea: no Chest pain: no Lower extremity edema: no Dizzy/lightheaded: no   CHRONIC KIDNEY DISEASE Followed by Dr. Holley Raring. CKD status: controlled Medications renally dose: no Previous renal  evaluation: yes Pneumovax:  Up to Date Influenza Vaccine:  Up to Date  DEPRESSION Continues on Sertraline 100 MG daily and reports good control. Mood status: controlled Satisfied with current treatment?: yes Symptom severity: mild  Duration of current treatment : chronic Side effects: no Medication compliance: good compliance Psychotherapy/counseling: none Previous psychiatric medications: Zoloft Depressed mood: occasional Anxious mood: no Anhedonia: no Significant weight loss or gain: no Insomnia: yes hard to fall asleep Fatigue: no Feelings of worthlessness or guilt: no Impaired concentration/indecisiveness: no Suicidal ideations: no Hopelessness: no Crying spells: no Depression screen [facility] 2/9 08/06/2018 12/05/2017 11/13/2017 05/09/2017 02/04/2017  Decreased Interest 1 2 1 1 1   Down, Depressed, Hopeless 1 2 1 1 2   PHQ - 2 Score 2 4 2 2 3   Altered sleeping 2 2 2 2 3   Tired, decreased energy 1 2 2  0 3  Change in appetite 0 0 0 0 3  Feeling bad or failure about yourself  0 2 1 1 2   Trouble concentrating 0 0 0 1 0  Moving slowly or fidgety/restless 0 0 0 0 0  Suicidal thoughts 0 0 0 0 0  PHQ-9 Score 5 10 7 6 14   Difficult doing work/chores - Not difficult at all - - -   GOUT Continues on Allopurinol with good control reported, 300 MG daily.  Reports no history of flare, was placed on this by Dr. Holley Raring for CKD per her report. Duration:chronic Swelling: no Redness: no Trauma: no Recent dietary change or indiscretion: no Fevers: no Nausea/vomiting: no Treatments attempted: On Allopurinol daily  LEFT BREAST MASS:  Being followed by Dr. Bary Castilla and is having surgery 08/13/18 due to questionable findings on needle biopsy.  This is same area patient has had assessed before, previous biopsy in past.  She reports at surgery they will be removing mass.  Denies any pain or changes to area since biopsy.  VITAMIN D DEFICIENCY: Continues on supplement.  Denies muscle pain, falls, or  recent fractures.  Relevant past medical, surgical, family and social history reviewed and updated as indicated. Interim medical history since our last visit reviewed. Allergies and medications reviewed and updated.  Review of Systems  Constitutional: Negative for activity change, appetite change, diaphoresis, fatigue and fever.  Respiratory: Negative for cough, chest tightness and shortness of breath.   Cardiovascular: Negative for chest pain, palpitations and leg swelling.  Gastrointestinal: Negative for abdominal distention, abdominal pain, constipation, diarrhea, nausea and vomiting.  Endocrine: Negative for cold intolerance, heat intolerance, polydipsia, polyphagia and polyuria.  Neurological: Negative for dizziness, syncope, weakness, light-headedness, numbness and headaches.  Psychiatric/Behavioral: Negative.     Per HPI unless specifically indicated above     Objective:    BP 120/76   Pulse 79   Temp 98.2 F (36.8 C) (Oral)   Ht 4\' 11"  (1.499 m)   Wt 161 lb (73 kg)   LMP  (LMP Unknown)   SpO2 96%   BMI 32.52 kg/m   Wt Readings from Last 3 Encounters:  08/06/18 161 lb (73 kg)  07/31/18 160 lb (72.6 kg)  05/27/18 149 lb 6 oz (67.8 kg)    Physical Exam Vitals signs and nursing note reviewed.  Constitutional:      General: She is awake.     Appearance: She is well-developed.  HENT:     Head: Normocephalic.     Right Ear: Hearing normal.     Left Ear: Hearing normal.     Nose: Nose normal.     Mouth/Throat:     Mouth: Mucous membranes are moist.  Eyes:     General: Lids are normal.        Right eye: No discharge.        Left eye: No discharge.     Conjunctiva/sclera: Conjunctivae normal.     Pupils: Pupils are equal, round, and reactive to light.  Neck:     Musculoskeletal: Normal range of motion and neck supple.     Thyroid: No thyromegaly.     Vascular: No carotid bruit.  Cardiovascular:     Rate and Rhythm: Normal rate and regular rhythm.     Heart  sounds: Normal heart sounds. No murmur. No gallop.   Pulmonary:     Effort: Pulmonary effort is normal.     Breath sounds: Normal breath sounds.  Abdominal:     General: Bowel sounds are normal.     Palpations: Abdomen is soft. There is no hepatomegaly or splenomegaly.  Musculoskeletal:     Right lower leg: No edema.     Left lower leg: No edema.  Lymphadenopathy:     Cervical: No cervical adenopathy.  Skin:    General: Skin is warm and dry.  Neurological:     Mental Status: She is alert and oriented to person, place, and time.  Psychiatric:        Attention and Perception: Attention normal.        Mood and Affect: Mood normal.        Behavior: Behavior normal. Behavior is cooperative.        Thought Content: Thought content  normal.        Judgment: Judgment normal.     Results for orders placed or performed in visit on 08/06/18  Bayer DCA Hb A1c Waived  Result Value Ref Range   HB A1C (BAYER DCA - WAIVED) 7.2 (H) <7.0 %  Lipid Panel Piccolo, Waived  Result Value Ref Range   Cholesterol Piccolo, Waived 152 <200 mg/dL   HDL Chol Piccolo, Waived 49 (L) >59 mg/dL   Triglycerides Piccolo,Waived 343 (H) <150 mg/dL   Chol/HDL Ratio Piccolo,Waive 3.1 mg/dL   LDL Chol Calc Piccolo Waived 34 <100 mg/dL   VLDL Chol Calc Piccolo,Waive 69 (H) <30 mg/dL  Microalbumin, Urine Waived  Result Value Ref Range   Microalb, Ur Waived 10 0 - 19 mg/L   Creatinine, Urine Waived 50 10 - 300 mg/dL   Microalb/Creat Ratio 30-300 (H) <30 mg/g   CrCl 60.98 based on recent labs and weight   Assessment & Plan:   Problem List Items Addressed This Visit      Cardiovascular and Mediastinum   Hypertensive heart/kidney disease without HF and with CKD stage III (HCC)    Chronic, stable with current regimen.  Continue current medication regimen.  BP below goal at home and in office today.  CMP ordered.      Relevant Orders   Comprehensive metabolic panel   Ambulatory referral to Chronic Care  Management Services     Digestive   Gastroesophageal reflux disease without esophagitis    Chronic, stable.  Continue current medication regimen, Famotidine, and check Mag level today.        Relevant Orders   Magnesium     Endocrine   Hyperlipidemia associated with type 2 diabetes mellitus (HCC)    Chronic, ongoing.  LDL 34 and TCHOL 152.  Continue current medication regimen.        Relevant Orders   Lipid Panel Piccolo, Waived (Completed)   Ambulatory referral to Chronic Care Management Services   Controlled type 2 diabetes mellitus with chronic kidney disease, with long-term current use of insulin ([place]) - Primary    Chronic, ongoing.  A1C today 7.2%.  Continue current medication regimen at this time and patient wishes to return to focus on diet and regular walks.  Concern for hypoglycemia if increase in insulin at this time, due to morning BS 110-120.  CCM referral placed.  Return in 3 months, if continued elevation of A1C above goal consider adjusting med regimen.      Relevant Orders   Bayer DCA Hb A1c Waived (Completed)   Microalbumin, Urine Waived (Completed)   Ambulatory referral to Chronic Care Management Services     Genitourinary   CKD (chronic kidney disease), stage III (HCC)    Chronic, ongoing.  Followed by nephrology.  Urine ALB 10 and A:C 30-300 today.  Continue Enalapril and collaboration with nephrology.  CMP ordered.      Relevant Orders   Ambulatory referral to Chronic Care Management Services     Other   Mass of lower outer quadrant of left breast    Continue to collaborate and review records from Dr. Bary Castilla.      Depression, major, single episode, in partial remission (HCC)    Chronic, stable.  PHQ =5.  Continue current medication regimen, Sertraline 100 MG daily.        Vitamin D deficiency    Vitamin D level today, continue supplement.      Relevant Orders   VITAMIN D 25 Hydroxy (Vit-D Deficiency, Fractures)  Chronic gout due to renal  impairment of multiple sites without tophus    Reports nephrology placed her on Allopurinol for kidneys.  No history of gout flares that she recalls.  Check uric acid level today and kidney function.      Relevant Orders   Uric acid       Follow up plan: Return in about 3 months (around 11/06/2018) for Annual physical.

## 2018-08-06 NOTE — Assessment & Plan Note (Signed)
Chronic, ongoing.  LDL 34 and TCHOL 152.  Continue current medication regimen.

## 2018-08-07 ENCOUNTER — Encounter
Admission: RE | Admit: 2018-08-07 | Discharge: 2018-08-07 | Disposition: A | Discharge status: Home / Self Care | Payer: Medicare HMO | Source: Ambulatory Visit | Attending: General Surgery | Admitting: General Surgery

## 2018-08-07 LAB — COMPREHENSIVE METABOLIC PANEL
ALT: 25 IU/L (ref 0–32)
AST: 47 IU/L — ABNORMAL HIGH (ref 0–40)
Albumin/Globulin Ratio: 1.8 (ref 1.2–2.2)
Albumin: 4.9 g/dL — ABNORMAL HIGH (ref 3.8–4.8)
Alkaline Phosphatase: 109 IU/L (ref 39–117)
BUN/Creatinine Ratio: 24 (ref 12–28)
BUN: 25 mg/dL (ref 8–27)
Bilirubin Total: 0.3 mg/dL (ref 0.0–1.2)
CO2: 22 mmol/L (ref 20–29)
Calcium: 9.8 mg/dL (ref 8.7–10.3)
Chloride: 101 mmol/L (ref 96–106)
Creatinine, Ser: 1.05 mg/dL — ABNORMAL HIGH (ref 0.57–1.00)
GFR calc Af Amer: 64 mL/min/{1.73_m2} (ref >59)
GFR calc non Af Amer: 56 mL/min/{1.73_m2} — ABNORMAL LOW (ref >59)
Globulin, Total: 2.7 g/dL (ref 1.5–4.5)
Glucose: 112 mg/dL — ABNORMAL HIGH (ref 65–99)
Potassium: 5.1 mmol/L (ref 3.5–5.2)
Sodium: 138 mmol/L (ref 134–144)
Total Protein: 7.6 g/dL (ref 6.0–8.5)

## 2018-08-07 LAB — URIC ACID: Uric Acid: 3.9 mg/dL (ref 2.5–7.1)

## 2018-08-07 LAB — VITAMIN D 25 HYDROXY (VIT D DEFICIENCY, FRACTURES): Vit D, 25-Hydroxy: 22.4 ng/mL — ABNORMAL LOW (ref 30.0–100.0)

## 2018-08-07 LAB — MAGNESIUM: Magnesium: 1.9 mg/dL (ref 1.6–2.3)

## 2018-08-07 NOTE — Pre-Procedure Instructions (Signed)
Telephone interview with patient to review medical history and medicines. Instructions reviewed. Patient had instructions from when  procedure was originally scheduled to refer to.

## 2018-08-08 ENCOUNTER — Other Ambulatory Visit: Payer: Self-pay

## 2018-08-08 ENCOUNTER — Other Ambulatory Visit
Admission: RE | Admit: 2018-08-08 | Discharge: 2018-08-08 | Disposition: A | Discharge status: Home / Self Care | Payer: Medicare HMO | Source: Ambulatory Visit | Attending: General Surgery | Admitting: General Surgery

## 2018-08-08 DIAGNOSIS — Z1159 Encounter for screening for other viral diseases: Secondary | ICD-10-CM | POA: Insufficient documentation

## 2018-08-09 LAB — NOVEL CORONAVIRUS, NAA (HOSP ORDER, SEND-OUT TO REF LAB; TAT 18-24 HRS): SARS-CoV-2, NAA: NOT DETECTED

## 2018-08-10 ENCOUNTER — Other Ambulatory Visit: Payer: Self-pay | Admitting: Nurse Practitioner

## 2018-08-12 ENCOUNTER — Ambulatory Visit: Payer: Self-pay | Admitting: Pharmacist

## 2018-08-12 ENCOUNTER — Telehealth: Payer: Self-pay

## 2018-08-12 NOTE — Chronic Care Management (AMB) (Signed)
  Chronic Care Management   Note  08/12/2018 Name: MARDENE LESSIG MRN: [id number] DOB: May 12, 1950  ASSATA JUNCAJ is a 67 y.o. year old female who is a primary care patient of Cannady, Barbaraann Faster, NP. The CCM team was consulted for assistance with chronic disease management and care coordination needs.    Contacted patient to discuss CCM program. Left HIPAA compliant message for patient to return my call at her convenience. Noted that patient does have surgery scheduled tomorrow.   Follow up plan: - If I do not hear back, will plan to contact in 7-10 business days.   Catie Darnelle Maffucci, PharmD Clinical Pharmacist [place] [phone]

## 2018-08-13 ENCOUNTER — Encounter
Admission: RE | Disposition: A | Discharge status: Home / Self Care | Payer: Self-pay | Source: Ambulatory Visit | Attending: General Surgery

## 2018-08-13 ENCOUNTER — Ambulatory Visit: Payer: Medicare HMO | Admitting: Certified Registered Nurse Anesthetist

## 2018-08-13 ENCOUNTER — Ambulatory Visit: Payer: Medicare HMO

## 2018-08-13 ENCOUNTER — Encounter: Payer: Self-pay | Admitting: *Deleted

## 2018-08-13 ENCOUNTER — Ambulatory Visit
Admission: RE | Admit: 2018-08-13 | Discharge: 2018-08-13 | Disposition: A | Discharge status: Home / Self Care | Payer: Medicare HMO | Source: Ambulatory Visit | Attending: General Surgery | Admitting: General Surgery

## 2018-08-13 ENCOUNTER — Ambulatory Visit: Payer: Medicare HMO | Admitting: Nurse Practitioner

## 2018-08-13 ENCOUNTER — Other Ambulatory Visit: Payer: Self-pay

## 2018-08-13 DIAGNOSIS — L905 Scar conditions and fibrosis of skin: Secondary | ICD-10-CM | POA: Diagnosis not present

## 2018-08-13 DIAGNOSIS — F329 Major depressive disorder, single episode, unspecified: Secondary | ICD-10-CM | POA: Insufficient documentation

## 2018-08-13 DIAGNOSIS — Z6832 Body mass index (BMI) 32.0-32.9, adult: Secondary | ICD-10-CM | POA: Diagnosis not present

## 2018-08-13 DIAGNOSIS — E1122 Type 2 diabetes mellitus with diabetic chronic kidney disease: Secondary | ICD-10-CM | POA: Diagnosis not present

## 2018-08-13 DIAGNOSIS — N6489 Other specified disorders of breast: Secondary | ICD-10-CM

## 2018-08-13 DIAGNOSIS — N6082 Other benign mammary dysplasias of left breast: Secondary | ICD-10-CM | POA: Insufficient documentation

## 2018-08-13 DIAGNOSIS — Z7982 Long term (current) use of aspirin: Secondary | ICD-10-CM | POA: Diagnosis not present

## 2018-08-13 DIAGNOSIS — E1151 Type 2 diabetes mellitus with diabetic peripheral angiopathy without gangrene: Secondary | ICD-10-CM | POA: Diagnosis not present

## 2018-08-13 DIAGNOSIS — N6022 Fibroadenosis of left breast: Secondary | ICD-10-CM | POA: Diagnosis not present

## 2018-08-13 DIAGNOSIS — F419 Anxiety disorder, unspecified: Secondary | ICD-10-CM | POA: Insufficient documentation

## 2018-08-13 DIAGNOSIS — E785 Hyperlipidemia, unspecified: Secondary | ICD-10-CM | POA: Diagnosis not present

## 2018-08-13 DIAGNOSIS — N182 Chronic kidney disease, stage 2 (mild): Secondary | ICD-10-CM | POA: Insufficient documentation

## 2018-08-13 DIAGNOSIS — E669 Obesity, unspecified: Secondary | ICD-10-CM | POA: Diagnosis not present

## 2018-08-13 DIAGNOSIS — R928 Other abnormal and inconclusive findings on diagnostic imaging of breast: Secondary | ICD-10-CM | POA: Diagnosis not present

## 2018-08-13 DIAGNOSIS — K219 Gastro-esophageal reflux disease without esophagitis: Secondary | ICD-10-CM | POA: Insufficient documentation

## 2018-08-13 DIAGNOSIS — Z8673 Personal history of transient ischemic attack (TIA), and cerebral infarction without residual deficits: Secondary | ICD-10-CM | POA: Insufficient documentation

## 2018-08-13 DIAGNOSIS — N6012 Diffuse cystic mastopathy of left breast: Secondary | ICD-10-CM | POA: Diagnosis not present

## 2018-08-13 DIAGNOSIS — Z794 Long term (current) use of insulin: Secondary | ICD-10-CM | POA: Insufficient documentation

## 2018-08-13 DIAGNOSIS — D493 Neoplasm of unspecified behavior of breast: Secondary | ICD-10-CM | POA: Diagnosis not present

## 2018-08-13 DIAGNOSIS — G4730 Sleep apnea, unspecified: Secondary | ICD-10-CM | POA: Diagnosis not present

## 2018-08-13 DIAGNOSIS — Z886 Allergy status to analgesic agent status: Secondary | ICD-10-CM | POA: Insufficient documentation

## 2018-08-13 DIAGNOSIS — I129 Hypertensive chronic kidney disease with stage 1 through stage 4 chronic kidney disease, or unspecified chronic kidney disease: Secondary | ICD-10-CM | POA: Diagnosis not present

## 2018-08-13 DIAGNOSIS — E1140 Type 2 diabetes mellitus with diabetic neuropathy, unspecified: Secondary | ICD-10-CM | POA: Insufficient documentation

## 2018-08-13 DIAGNOSIS — N183 Chronic kidney disease, stage 3 (moderate): Secondary | ICD-10-CM | POA: Diagnosis not present

## 2018-08-13 DIAGNOSIS — Z91018 Allergy to other foods: Secondary | ICD-10-CM | POA: Diagnosis not present

## 2018-08-13 DIAGNOSIS — N6032 Fibrosclerosis of left breast: Secondary | ICD-10-CM | POA: Insufficient documentation

## 2018-08-13 DIAGNOSIS — Z79899 Other long term (current) drug therapy: Secondary | ICD-10-CM | POA: Diagnosis not present

## 2018-08-13 HISTORY — PX: BREAST BIOPSY: SHX20

## 2018-08-13 HISTORY — PX: BREAST EXCISIONAL BIOPSY: SUR124

## 2018-08-13 LAB — GLUCOSE, CAPILLARY
Glucose-Capillary: 177 mg/dL — ABNORMAL HIGH (ref 70–99)
Glucose-Capillary: 135 mg/dL — ABNORMAL HIGH (ref 70–99)

## 2018-08-13 SURGERY — BREAST BIOPSY
Anesthesia: General | Site: Breast | Laterality: Left

## 2018-08-13 MED ORDER — SEVOFLURANE IN SOLN
RESPIRATORY_TRACT | Status: AC
  Filled 2018-08-13: qty 250

## 2018-08-13 MED ORDER — MIDAZOLAM HCL 2 MG/2ML IJ SOLN
INTRAMUSCULAR | Status: AC
  Filled 2018-08-13: qty 2

## 2018-08-13 MED ORDER — SODIUM CHLORIDE 0.9 % IV SOLN
INTRAVENOUS | Status: DC
  Administered 2018-08-13: 09:00:00 via INTRAVENOUS

## 2018-08-13 MED ORDER — FENTANYL CITRATE (PF) 100 MCG/2ML IJ SOLN: 25.0000 ug | INTRAMUSCULAR | Status: DC | PRN

## 2018-08-13 MED ORDER — BUPIVACAINE-EPINEPHRINE (PF) 0.5% -1:200000 IJ SOLN
INTRAMUSCULAR | Status: AC
  Filled 2018-08-13: qty 30

## 2018-08-13 MED ORDER — GABAPENTIN 300 MG PO CAPS
300.0000 mg | ORAL_CAPSULE | ORAL | Status: AC
  Administered 2018-08-13: 09:00:00 300 mg via ORAL

## 2018-08-13 MED ORDER — LACTATED RINGERS IV SOLN
INTRAVENOUS | Status: DC | PRN
  Administered 2018-08-13: 11:00:00 via INTRAVENOUS

## 2018-08-13 MED ORDER — BUPIVACAINE-EPINEPHRINE (PF) 0.5% -1:200000 IJ SOLN
INTRAMUSCULAR | Status: DC | PRN
  Administered 2018-08-13: 30 mL

## 2018-08-13 MED ORDER — EPHEDRINE SULFATE 50 MG/ML IJ SOLN
INTRAMUSCULAR | Status: DC | PRN
  Administered 2018-08-13: 5 mg via INTRAVENOUS

## 2018-08-13 MED ORDER — ONDANSETRON HCL 4 MG/2ML IJ SOLN: 4.0000 mg | Freq: Once | INTRAMUSCULAR | Status: DC | PRN

## 2018-08-13 MED ORDER — LIDOCAINE HCL (CARDIAC) PF 100 MG/5ML IV SOSY
PREFILLED_SYRINGE | INTRAVENOUS | Status: DC | PRN
  Administered 2018-08-13: 60 mg via INTRAVENOUS

## 2018-08-13 MED ORDER — ONDANSETRON HCL 4 MG/2ML IJ SOLN
INTRAMUSCULAR | Status: DC | PRN
  Administered 2018-08-13: 4 mg via INTRAVENOUS

## 2018-08-13 MED ORDER — HYDROCODONE-ACETAMINOPHEN 5-325 MG PO TABS: 1.0000 | ORAL_TABLET | ORAL | 0 refills | Status: DC | PRN

## 2018-08-13 MED ORDER — GABAPENTIN 300 MG PO CAPS
ORAL_CAPSULE | ORAL | Status: AC
  Administered 2018-08-13: 300 mg via ORAL
  Filled 2018-08-13: qty 1

## 2018-08-13 MED ORDER — PROPOFOL 10 MG/ML IV BOLUS
INTRAVENOUS | Status: DC | PRN
  Administered 2018-08-13: 160 mg via INTRAVENOUS

## 2018-08-13 MED ORDER — MIDAZOLAM HCL 2 MG/2ML IJ SOLN
INTRAMUSCULAR | Status: DC | PRN
  Administered 2018-08-13: 2 mg via INTRAVENOUS

## 2018-08-13 MED ORDER — ACETAMINOPHEN 10 MG/ML IV SOLN
INTRAVENOUS | Status: AC
  Filled 2018-08-13: qty 100

## 2018-08-13 MED ORDER — FENTANYL CITRATE (PF) 100 MCG/2ML IJ SOLN
INTRAMUSCULAR | Status: DC | PRN
  Administered 2018-08-13 (×2): 25 ug via INTRAVENOUS
  Administered 2018-08-13: 50 ug via INTRAVENOUS

## 2018-08-13 MED ORDER — PHENYLEPHRINE HCL (PRESSORS) 10 MG/ML IV SOLN
INTRAVENOUS | Status: DC | PRN
  Administered 2018-08-13 (×2): 100 ug via INTRAVENOUS
  Administered 2018-08-13 (×2): 200 ug via INTRAVENOUS

## 2018-08-13 MED ORDER — ACETAMINOPHEN 10 MG/ML IV SOLN
INTRAVENOUS | Status: DC | PRN
  Administered 2018-08-13: 1000 mg via INTRAVENOUS

## 2018-08-13 MED ORDER — FENTANYL CITRATE (PF) 100 MCG/2ML IJ SOLN
INTRAMUSCULAR | Status: AC
  Filled 2018-08-13: qty 2

## 2018-08-13 SURGICAL SUPPLY — 43 items
BINDER BREAST LRG (GAUZE/BANDAGES/DRESSINGS) IMPLANT
BINDER BREAST MEDIUM (GAUZE/BANDAGES/DRESSINGS) IMPLANT
BINDER BREAST XLRG (GAUZE/BANDAGES/DRESSINGS) ×2 IMPLANT
BINDER BREAST XXLRG (GAUZE/BANDAGES/DRESSINGS) IMPLANT
BLADE SURG 15 STRL SS SAFETY (BLADE) ×4 IMPLANT
CANISTER SUCT 1200ML W/VALVE (MISCELLANEOUS) ×2 IMPLANT
CHLORAPREP W/TINT 26 (MISCELLANEOUS) ×2 IMPLANT
CNTNR SPEC 2.5X3XGRAD LEK (MISCELLANEOUS) ×1
CONT SPEC 4OZ STER OR WHT (MISCELLANEOUS) ×1
CONTAINER SPEC 2.5X3XGRAD LEK (MISCELLANEOUS) ×1 IMPLANT
COVER PROBE FLX POLY STRL (MISCELLANEOUS) ×2 IMPLANT
COVER WAND RF STERILE (DRAPES) ×2 IMPLANT
DEVICE DUBIN SPECIMEN MAMMOGRA (MISCELLANEOUS) IMPLANT
DRAPE LAPAROTOMY 100X77 ABD (DRAPES) ×2 IMPLANT
DRSG GAUZE FLUFF 36X18 (GAUZE/BANDAGES/DRESSINGS) ×4 IMPLANT
DRSG TELFA 4X3 1S NADH ST (GAUZE/BANDAGES/DRESSINGS) ×2 IMPLANT
ELECT CAUTERY BLADE TIP 2.5 (TIP) ×2
ELECT REM PT RETURN 9FT ADLT (ELECTROSURGICAL) ×2
ELECTRODE CAUTERY BLDE TIP 2.5 (TIP) ×1 IMPLANT
ELECTRODE REM PT RTRN 9FT ADLT (ELECTROSURGICAL) ×1 IMPLANT
GLOVE BIO SURGEON STRL SZ7.5 (GLOVE) ×2 IMPLANT
GLOVE INDICATOR 8.0 STRL GRN (GLOVE) ×2 IMPLANT
GOWN STRL REUS W/ TWL LRG LVL3 (GOWN DISPOSABLE) ×2 IMPLANT
GOWN STRL REUS W/TWL LRG LVL3 (GOWN DISPOSABLE) ×2
KIT TURNOVER KIT A (KITS) ×2 IMPLANT
LABEL OR SOLS (LABEL) ×2 IMPLANT
MARGIN MAP 10MM (MISCELLANEOUS) ×2 IMPLANT
NEEDLE HYPO 22GX1.5 SAFETY (NEEDLE) ×2 IMPLANT
NEEDLE HYPO 25X1 1.5 SAFETY (NEEDLE) ×2 IMPLANT
PACK BASIN MINOR ARMC (MISCELLANEOUS) ×2 IMPLANT
RETRACTOR RING XSMALL (MISCELLANEOUS) ×1 IMPLANT
RTRCTR WOUND ALEXIS 13CM XS SH (MISCELLANEOUS) ×2
SHEARS HARMONIC 9CM CVD (BLADE) IMPLANT
STRIP CLOSURE SKIN 1/2X4 (GAUZE/BANDAGES/DRESSINGS) ×2 IMPLANT
SUT ETHILON 3-0 FS-10 30 BLK (SUTURE) ×2
SUT VIC AB 2-0 CT1 27 (SUTURE) ×2
SUT VIC AB 2-0 CT1 TAPERPNT 27 (SUTURE) ×2 IMPLANT
SUT VIC AB 4-0 FS2 27 (SUTURE) ×2 IMPLANT
SUTURE EHLN 3-0 FS-10 30 BLK (SUTURE) ×1 IMPLANT
SWABSTK COMLB BENZOIN TINCTURE (MISCELLANEOUS) ×2 IMPLANT
SYR 10ML LL (SYRINGE) ×2 IMPLANT
TAPE TRANSPORE STRL 2 31045 (GAUZE/BANDAGES/DRESSINGS) ×2 IMPLANT
WATER STERILE IRR 1000ML POUR (IV SOLUTION) ×2 IMPLANT

## 2018-08-13 NOTE — Anesthesia Post-op Follow-up Note (Signed)
Anesthesia QCDR form completed.        

## 2018-08-13 NOTE — Anesthesia Postprocedure Evaluation (Signed)
Anesthesia Post Note  Patient: Kara Leach  Procedure(s) Performed: WIDE LOCALIZATION AND OPEN BIOPSY OF LEFT BREAST, DIABETIC (Left Breast)  Patient location during evaluation: PACU Anesthesia Type: General Level of consciousness: awake and alert Pain management: pain level controlled Vital Signs Assessment: post-procedure vital signs reviewed and stable Respiratory status: spontaneous breathing, nonlabored ventilation, respiratory function stable and patient connected to nasal cannula oxygen Cardiovascular status: blood pressure returned to baseline and stable Postop Assessment: no apparent nausea or vomiting Anesthetic complications: no     Last Vitals:  Vitals:   08/13/18 1209 08/13/18 1224  BP: 109/64 (!) 112/96  Pulse: 88 88  Resp: 20 20  Temp:  36.7 C  SpO2: 100% 94%    Last Pain:  Vitals:   08/13/18 1224  TempSrc:   PainSc: 0-No pain                 Molli Barrows

## 2018-08-13 NOTE — Op Note (Signed)
Preoperative diagnosis: Possible radial scar of the left breast.  Postoperative diagnosis: Same.  Operative procedure: Left breast wide excision with ultrasound and wire localization.  Operating Surgeon: Hervey Ard, MD.  Anesthesia: General by LMA, Marcaine 0.5% with 1 to 200,000 units of epinephrine, 30 cc.  Estimated blood loss: 5 cc.  Medical note: This 68 year old woman has had 2 biopsies in the lower outer quadrant of the left breast in the last 2 years.  The most recent biopsy suggest the possibility of a radial scar.  Would like to proceed to formal excision.  Operative note: The patient tolerated general anesthesia well.  Wire localization had been completed prior to presentation of the operating theater.  The breast was carefully prepped with ChloraPrep and draped and taped to the right to provide better exposure.  A radial incision was made at the 5 o'clock position after identifying the tip of the localizing wire with ultrasound.  The skin was incised sharply and remaining dissection was completed electrocautery.  After elevating the adipose layer off the underlying breast parenchyma an extra small Alexis wound protector was placed for exposure.  A block of tissue measuring 3 x 3 x 4 cm was excised and orientated.  Specimen radiograph confirmed both the previously placed clips within the specimen and the intact wire tip.  Hemostasis was electrocautery.  The deep tissue was approximated in layers with interrupted 2-0 Vicryl figure-of-eight sutures.  The subcutaneous fat was approximated with a running 2-0 Vicryl suture.  Skin was closed with a running 4-0 Vicryl subcuticular suture.  Benzoin Steri-Strips followed by Telfa, fluff gauze and a compressive wrap were applied.  The patient tolerated the procedure well and was taken to the recovery room in stable condition.

## 2018-08-13 NOTE — Anesthesia Preprocedure Evaluation (Signed)
Anesthesia Evaluation  Patient identified by MRN, date of birth, ID band Patient awake    Reviewed: Allergy & Precautions, H&P , NPO status , Patient's Chart, lab work & pertinent test results, reviewed documented beta blocker date and time   Airway Mallampati: II  TM Distance: >3 FB Neck ROM: full    Dental  (+) Teeth Intact   Pulmonary sleep apnea ,    Pulmonary exam normal        Cardiovascular Exercise Tolerance: Good hypertension, On Medications + Peripheral Vascular Disease  Normal cardiovascular exam Rate:Normal     Neuro/Psych  Headaches, Seizures -, Well Controlled,  PSYCHIATRIC DISORDERS Anxiety Depression CVA    GI/Hepatic Neg liver ROS, GERD  Medicated,  Endo/Other  negative endocrine ROSdiabetes  Renal/GU Renal disease  negative genitourinary   Musculoskeletal   Abdominal   Peds  Hematology negative hematology ROS (+)   Anesthesia Other Findings   Reproductive/Obstetrics negative OB ROS                             Anesthesia Physical Anesthesia Plan  ASA: III  Anesthesia Plan: General LMA   Post-op Pain Management:    Induction:   PONV Risk Score and Plan:   Airway Management Planned:   Additional Equipment:   Intra-op Plan:   Post-operative Plan:   Informed Consent: I have reviewed the patients History and Physical, chart, labs and discussed the procedure including the risks, benefits and alternatives for the proposed anesthesia with the patient or authorized representative who has indicated his/her understanding and acceptance.       Plan Discussed with: CRNA  Anesthesia Plan Comments:         Anesthesia Quick Evaluation

## 2018-08-13 NOTE — Transfer of Care (Signed)
Immediate Anesthesia Transfer of Care Note  Patient: Kara Leach  Procedure(s) Performed: WIDE LOCALIZATION AND OPEN BIOPSY OF LEFT BREAST, DIABETIC (Left Breast)  Patient Location: PACU  Anesthesia Type:General  Level of Consciousness: sedated  Airway & Oxygen Therapy: Patient Spontanous Breathing and Patient connected to face mask oxygen  Post-op Assessment: Report given to RN and Post -op Vital signs reviewed and stable  Post vital signs: Reviewed and stable  Last Vitals:  Vitals Value Taken Time  BP 127/63 08/13/2018 11:39 AM  Temp 36.3 C 08/13/2018 11:39 AM  Pulse 91 08/13/2018 11:41 AM  Resp 18 08/13/2018 11:41 AM  SpO2 100 % 08/13/2018 11:41 AM  Vitals shown include unvalidated device data.  Last Pain:  Vitals:   08/13/18 1139  TempSrc:   PainSc: 0-No pain         Complications: No apparent anesthesia complications

## 2018-08-13 NOTE — Addendum Note (Signed)
Addendum  created 08/13/18 1333 by Dionne Bucy, CRNA   Intraprocedure Meds edited

## 2018-08-13 NOTE — Anesthesia Procedure Notes (Signed)
Procedure Name: LMA Insertion Date/Time: 08/13/2018 10:40 AM Performed by: Willette Alma, CRNA Pre-anesthesia Checklist: Patient identified, Patient being monitored, Timeout performed, Emergency Drugs available and Suction available Patient Re-evaluated:Patient Re-evaluated prior to induction Oxygen Delivery Method: Circle system utilized Preoxygenation: Pre-oxygenation with 100% oxygen Induction Type: IV induction Ventilation: Mask ventilation without difficulty LMA: LMA inserted LMA Size: 4.0 Tube type: Oral Number of attempts: 1 Placement Confirmation: positive ETCO2 and breath sounds checked- equal and bilateral Tube secured with: Tape Dental Injury: Teeth and Oropharynx as per pre-operative assessment

## 2018-08-13 NOTE — H&P (Signed)
No change in clinical history or exam.  For left breast wire localization and excision of possible radial scar.

## 2018-08-13 NOTE — Discharge Instructions (Signed)

## 2018-08-14 ENCOUNTER — Encounter: Payer: Self-pay | Admitting: General Surgery

## 2018-08-19 ENCOUNTER — Telehealth: Payer: Self-pay

## 2018-08-19 ENCOUNTER — Ambulatory Visit: Payer: Self-pay | Admitting: Pharmacist

## 2018-08-19 NOTE — Chronic Care Management (AMB) (Signed)
  Chronic Care Management   Note  08/19/2018 Name: Kara Leach MRN: [id number] DOB: [birth year]  [name] is a 68 y.o. year old female who is a primary care patient of Cannady, Barbaraann Faster, NP. The CCM team was consulted for assistance with chronic disease management and care coordination needs.    Contacted patient telephonically to discuss CCM services. Left HIPAA compliant message for patient to return my call at her convenience.   Follow up plan: - If I do not hear back, will attempt outreach again within 7 days  Catie Darnelle Maffucci, PharmD Clinical Pharmacist [place] [phone]

## 2018-08-20 LAB — SURGICAL PATHOLOGY

## 2018-08-21 ENCOUNTER — Encounter: Payer: Medicare HMO | Admitting: General Surgery

## 2018-08-27 ENCOUNTER — Telehealth: Payer: Self-pay

## 2018-08-28 ENCOUNTER — Ambulatory Visit (INDEPENDENT_AMBULATORY_CARE_PROVIDER_SITE_OTHER): Payer: Medicare HMO | Admitting: General Surgery

## 2018-08-28 ENCOUNTER — Other Ambulatory Visit: Payer: Self-pay

## 2018-08-28 ENCOUNTER — Encounter: Payer: Self-pay | Admitting: General Surgery

## 2018-08-28 ENCOUNTER — Telehealth: Payer: Self-pay | Admitting: *Deleted

## 2018-08-28 VITALS — BP 114/67 | HR 93 | Resp 18 | Ht 59.0 in | Wt 159.0 lb

## 2018-08-28 DIAGNOSIS — N6092 Unspecified benign mammary dysplasia of left breast: Secondary | ICD-10-CM

## 2018-08-28 NOTE — Progress Notes (Signed)
Patient ID: Kara Leach, female   DOB: 1950-08-23, 68 y.o.   MRN: [id number]  Chief Complaint  Patient presents with  . Routine Post Op    Left breast excision    HPI Kara Leach is a 68 y.o. female.  Here today for post operative care for left breast surgery. Patient states she has bruising and some soreness.  HPI  Past Medical History:  Diagnosis Date  . Allergy   . Anxiety   . Arthritis    Osteoarthritis  . Cancer ([place]) 05/2018   possibly on left breast but not sure  . Chronic kidney disease   . Depression   . Diabetes mellitus without complication ([place]) 0350  . Diabetic neuropathy (HCC)    feet  . Diverticulitis 2017  . GERD (gastroesophageal reflux disease)   . Headache    sinus and migraines on occasion  . Hyperlipidemia   . Hypertension   . Last menstrual period (LMP) > 10 days ago 1986  . Obesity   . Peripheral vascular disease (HCC)    neuropathy in hands and feet d/t diabetes  . Renal insufficiency    Stage 2 kidney disease  . Seizures ([place])    due to high blood sugars. last time = 09/2014  . Sleep apnea    has CPAP, can't tolerate  . Stroke ([place]) 1980   minor. left side remains weaker than right  . Wears hearing aid    right side  . Wrist weakness    left - after fracture and repair    Past Surgical History:  Procedure Laterality Date  . ABDOMINAL HYSTERECTOMY  1986  . APPENDECTOMY  1964  . BREAST BIOPSY Left 07/03/2016   benign, fibrocystic changes with calcifications, usual ductal hyperplasia.   Marland Kitchen BREAST BIOPSY Left 04/23/2018   radial scar  . BREAST BIOPSY Left 08/13/2018   Procedure: WIDE LOCALIZATION AND OPEN BIOPSY OF LEFT BREAST, DIABETIC;  Surgeon: Robert Bellow, MD;  Location: [facility];  Service: General;  Laterality: Left;  . BREAST EXCISIONAL BIOPSY Left 08/13/2018  . CHOLECYSTECTOMY  2012  . COLONOSCOPY WITH PROPOFOL N/A 12/01/2015   Procedure: COLONOSCOPY WITH PROPOFOL;  Surgeon: [name], MD;  Location: [facility];  Service: Endoscopy;  Laterality: N/A;  Diabetic - insulin Sleep apnea  . PATELLA FRACTURE SURGERY Left    1. had to recenter kneecap, 2 and 3 surgeries were arthroscopies  . WRIST FRACTURE SURGERY Left 2005   Steel plate    Family History  Problem Relation Age of Onset  . Diabetes Mother   . Heart disease Mother   . Hypertension Mother   . Diabetes Sister   . Cancer Father        stomach  . Arthritis Sister   . Stroke Sister   . Hypertension Sister   . Breast cancer Maternal Aunt 23       great aunt    Social History Social History   Tobacco Use  . Smoking status: Never Smoker  . Smokeless tobacco: Never Used  Substance Use Topics  . Alcohol use: Not Currently    Alcohol/week: 0.0 standard drinks    Comment: occasionally holiday, weddings  . Drug use: No    Allergies  Allergen Reactions  . Strawberry Extract Swelling    Lips swell and rash/whelps over body Difficulty breathing  . Ibuprofen Other (See Comments)    "ringing in ears", dizziness Also, Advil / Aleve / Motrin    Current Outpatient  Medications  Medication Sig Dispense Refill  . acetaminophen (TYLENOL ARTHRITIS PAIN) 650 MG CR tablet Take 650 mg by mouth every 8 (eight) hours as needed for pain.    Marland Kitchen allopurinol (ZYLOPRIM) 300 MG tablet Take 1 tablet (300 mg total) by mouth daily. 90 tablet 3  . amLODipine (NORVASC) 5 MG tablet Take 1 tablet (5 mg total) by mouth daily. 90 tablet 3  . aspirin EC 81 MG tablet Take 81 mg by mouth daily.    . BD PEN NEEDLE NANO U/F 32G X 4 MM MISC USE EVERY MORNING 100 each 12  . Calcium Carb-Cholecalciferol (CALCIUM 600 + D PO) Take 1 tablet by mouth 2 (two) times daily.    . enalapril (VASOTEC) 2.5 MG tablet TAKE 1 TABLET BY MOUTH DAILY 90 tablet 1  . Famotidine (PEPCID PO) Take by mouth 2 (two) times daily.    . fluticasone (FLONASE) 50 MCG/ACT nasal spray PLACE 2 SPRAYS INTO BOTH NOSTRILS DAILY AS NEEDED FOR ALLERGIES. 16 g 12  . insulin aspart (NOVOLOG  FLEXPEN) 100 UNIT/ML FlexPen INJECT 15U UNDER THE SKIN 3 TIMES DAILY WITH MEALS 15 pen 12  . LEVEMIR FLEXTOUCH 100 UNIT/ML Pen INJECT 60 UNITS INTO THE SKIN DAILY AT 10 PM. 15 mL 1  . liraglutide (VICTOZA) 18 MG/3ML SOPN ADMINISTER 1.2MG  UNDER THE SKIN DAILY 3 pen 12  . metoprolol succinate (TOPROL-XL) 25 MG 24 hr tablet Take 1 tablet (25 mg total) by mouth daily. 90 tablet 3  . ONETOUCH VERIO test strip USE TO CHECK BLOOD SUGAR DAILY 100 each 12  . sertraline (ZOLOFT) 100 MG tablet TAKE 1 TABLET (100 MG TOTAL) EVERY MORNING BY MOUTH. 90 tablet 0  . simvastatin (ZOCOR) 40 MG tablet TAKE 1 TABLET BY MOUTH EVERYDAY AT BEDTIME 90 tablet 1  . Specialty Vitamins Products (VITA-RX DIABETIC VITAMIN) CAPS Take 1 packet by mouth daily. Diabetic Vitamins - 6 or 7 in 1 pack    . HYDROcodone-acetaminophen (NORCO/VICODIN) 5-325 MG tablet Take 1 tablet by mouth every 4 (four) hours as needed for moderate pain. 12 tablet 0   No current facility-administered medications for this visit.     Review of Systems Review of Systems  Constitutional: Negative.   Respiratory: Negative.   Cardiovascular: Negative.     Blood pressure 114/67, pulse 93, resp. rate 18, height 4\' 11"  (1.499 m), weight 159 lb (72.1 kg), SpO2 96 %.  Physical Exam Physical Exam Constitutional:      Appearance: She is well-developed.  Eyes:     Conjunctiva/sclera: Conjunctivae normal.  Chest:    Skin:    General: Skin is warm and dry.  Neurological:     Mental Status: She is alert and oriented to person, place, and time.     Data Reviewed A. BREAST, LEFT LOWER OUTER QUADRANT; WIDE EXCISION WITH NEEDLE  LOCALIZATION:  - COMPLEX PROLIFERATIVE LESION (FOCI OF ADENOSIS, USUAL AND ATYPICAL  EPITHELIAL HYPERPLASIA, APOCRINE METAPLASIA, PAPILLARY CHANGES, CYSTIC  CHANGES AND FIBROSIS), SEE COMMENT.  - HISTOPATHOLOGICAL FINDINGS (FIBROSIS WITH PRIOR HEMORRHAGE  AND FOREIGN BODY TYPE NON-NECROTIZING GRANULOMATOUS NODULE)   CONSISTENT WITH PRIOR BIOPSY SITES (ARS [phone] and [phone]).  - MICROCALCIFICATIONS ARE IDENTIFIED.  - AN INTRAMAMMARY LYMPH NODE IS NOT IDENTIFIED.  - DUCTAL CARCINOMA IN SITU AND AN INVASIVE CARCINOMA ARE NOT  PRESENT.   Size of specimen: 10.2 (superior-inferior) x 8.2 (medial-lateral) x 2.1    This wide excision shows benign complex changes as noted in the  diagnosis. These complex changes could  be from a single, localized  lesion or as a part of a more diffuse mammary process, such as a  proliferative fibrocystic mastopathy. Correlation with the clinical data  and the imaging data is encouraged. Further, the patient's recent  biopsy (ARS [id number]) was examined. This shows benign changes. Again,  ductal carcinoma in situ and an invasive carcinoma are not identified.   Assessment Doing well post wide excision of an area of a complex proliferative lesion with foci of atypical epithelial hyperplasia.  No evidence of DCIS or invasive cancer.  Plan The role for chemoprevention and lesion such as this was discussed with the patient.  Bone density testing prior to initiation of therapy has been recommended to determine if she would be better served by tamoxifen or an aromatase inhibitor.  Follow up in one month   HPI, Physical Exam, Assessment and Plan have been scribed under the direction and in the presence of Robert Bellow, MD. Kara Leach, CMA HPI, assessment, plan and physical exam has been scribed under the direction and in the presence of Robert Bellow, MD. Kara Fetch, RN  I have completed the exam and reviewed the above documentation for accuracy and completeness.  I agree with the above.  Haematologist has been used and any errors in dictation or transcription are unintentional.  Hervey Ard, M.D., F.A.C.S.  Forest Gleason Manuelito Poage 08/31/2018, 1:41 PM

## 2018-08-28 NOTE — Patient Instructions (Addendum)
The patient is aware to call back for any questions or new concerns. Bone density 10-09-18 at 1:40, no calcium the morning of test.

## 2018-08-28 NOTE — Telephone Encounter (Signed)
Bone density [facility] 10-09-18 at 1:40, no calcium or calcium supplements morning of the test.

## 2018-08-29 ENCOUNTER — Telehealth: Payer: Self-pay

## 2018-08-29 ENCOUNTER — Ambulatory Visit: Payer: Self-pay | Admitting: Pharmacist

## 2018-08-29 NOTE — Telephone Encounter (Signed)
Patient was given this information on 08/29/2018.

## 2018-08-29 NOTE — Chronic Care Management (AMB) (Signed)
  Chronic Care Management   Note  08/29/2018 Name: NIGEL WESSMAN MRN: [id number] DOB: March 09, 1951  SIDDALEE VANDERHEIDEN is a 68 y.o. year old female who is a primary care patient of Cannady, Barbaraann Faster, NP. The CCM team was consulted for assistance with chronic disease management and care coordination needs.    Third unsuccessful outreach attempt to discuss CCM program with the patient, however, contact may be complicated by her recent surgery. Left HIPAA compliant message for her to return my call at her convenience.   Follow up plan: - If I do not hear back from her, will plan to attempt outreach again end of July/beginning of August to allow ample time for recovery   Catie Darnelle Maffucci, PharmD Clinical Pharmacist [place] [phone]

## 2018-09-17 ENCOUNTER — Other Ambulatory Visit: Payer: Self-pay | Admitting: Nurse Practitioner

## 2018-10-01 DIAGNOSIS — R69 Illness, unspecified: Secondary | ICD-10-CM | POA: Diagnosis not present

## 2018-10-02 ENCOUNTER — Ambulatory Visit: Payer: Medicare HMO | Admitting: General Surgery

## 2018-10-02 ENCOUNTER — Encounter: Payer: Self-pay | Admitting: General Surgery

## 2018-10-02 ENCOUNTER — Other Ambulatory Visit: Payer: Self-pay

## 2018-10-02 NOTE — Patient Instructions (Addendum)
  Bone density is scheduled for 10/09/2018. The patient has been asked to return to the office in six month with a bilateral diagnostic mammogram

## 2018-10-02 NOTE — Progress Notes (Unsigned)
Patient ID: Kara Leach, female   DOB: Oct 05, 1950, 68 y.o.   MRN: [id number]  Chief Complaint  Patient presents with  . Follow-up    HPI Kara Leach is a 68 y.o. female Here today for follow up  left breast surgery on 08/11/2018.  Patient states she is doing well .  HPI  Past Medical History:  Diagnosis Date  . Allergy   . Anxiety   . Arthritis    Osteoarthritis  . Cancer ([place]) 05/2018   possibly on left breast but not sure  . Chronic kidney disease   . Depression   . Diabetes mellitus without complication ([place]) 3154  . Diabetic neuropathy (HCC)    feet  . Diverticulitis 2017  . GERD (gastroesophageal reflux disease)   . Headache    sinus and migraines on occasion  . Hyperlipidemia   . Hypertension   . Last menstrual period (LMP) > 10 days ago 1986  . Obesity   . Peripheral vascular disease (HCC)    neuropathy in hands and feet d/t diabetes  . Renal insufficiency    Stage 2 kidney disease  . Seizures ([place])    due to high blood sugars. last time = 09/2014  . Sleep apnea    has CPAP, can't tolerate  . Stroke ([place]) 1980   minor. left side remains weaker than right  . Wears hearing aid    right side  . Wrist weakness    left - after fracture and repair    Past Surgical History:  Procedure Laterality Date  . ABDOMINAL HYSTERECTOMY  1986  . APPENDECTOMY  1964  . BREAST BIOPSY Left 07/03/2016   benign, fibrocystic changes with calcifications, usual ductal hyperplasia.   Marland Kitchen BREAST BIOPSY Left 04/23/2018   radial scar  . BREAST BIOPSY Left 08/13/2018   Procedure: WIDE LOCALIZATION AND OPEN BIOPSY OF LEFT BREAST, DIABETIC;  Surgeon: Robert Bellow, MD;  Location: [facility];  Service: General;  Laterality: Left;  . BREAST EXCISIONAL BIOPSY Left 08/13/2018  . CHOLECYSTECTOMY  2012  . COLONOSCOPY WITH PROPOFOL N/A 12/01/2015   Procedure: COLONOSCOPY WITH PROPOFOL;  Surgeon: [name], MD;  Location: [facility];  Service: Endoscopy;  Laterality: N/A;   Diabetic - insulin Sleep apnea  . PATELLA FRACTURE SURGERY Left    1. had to recenter kneecap, 2 and 3 surgeries were arthroscopies  . WRIST FRACTURE SURGERY Left 2005   Steel plate    Family History  Problem Relation Age of Onset  . Diabetes Mother   . Heart disease Mother   . Hypertension Mother   . Diabetes Sister   . Cancer Father        stomach  . Arthritis Sister   . Stroke Sister   . Hypertension Sister   . Breast cancer Maternal Aunt 63       great aunt    Social History Social History   Tobacco Use  . Smoking status: Never Smoker  . Smokeless tobacco: Never Used  Substance Use Topics  . Alcohol use: Not Currently    Alcohol/week: 0.0 standard drinks    Comment: occasionally holiday, weddings  . Drug use: No    Allergies  Allergen Reactions  . Strawberry Extract Swelling    Lips swell and rash/whelps over body Difficulty breathing  . Ibuprofen Other (See Comments)    "ringing in ears", dizziness Also, Advil / Aleve / Motrin    Current Outpatient Medications  Medication Sig Dispense Refill  .  acetaminophen (TYLENOL ARTHRITIS PAIN) 650 MG CR tablet Take 650 mg by mouth every 8 (eight) hours as needed for pain.    Marland Kitchen allopurinol (ZYLOPRIM) 300 MG tablet Take 1 tablet (300 mg total) by mouth daily. 90 tablet 3  . amLODipine (NORVASC) 5 MG tablet Take 1 tablet (5 mg total) by mouth daily. 90 tablet 3  . aspirin EC 81 MG tablet Take 81 mg by mouth daily.    . BD PEN NEEDLE NANO U/F 32G X 4 MM MISC USE EVERY MORNING 100 each 12  . Calcium Carb-Cholecalciferol (CALCIUM 600 + D PO) Take 1 tablet by mouth 2 (two) times daily.    . enalapril (VASOTEC) 2.5 MG tablet TAKE 1 TABLET BY MOUTH DAILY 90 tablet 1  . Famotidine (PEPCID PO) Take by mouth 2 (two) times daily.    . fluticasone (FLONASE) 50 MCG/ACT nasal spray PLACE 2 SPRAYS INTO BOTH NOSTRILS DAILY AS NEEDED FOR ALLERGIES. 16 g 12  . HYDROcodone-acetaminophen (NORCO/VICODIN) 5-325 MG tablet Take 1 tablet by  mouth every 4 (four) hours as needed for moderate pain. 12 tablet 0  . insulin aspart (NOVOLOG FLEXPEN) 100 UNIT/ML FlexPen INJECT 15U UNDER THE SKIN 3 TIMES DAILY WITH MEALS 15 pen 12  . LEVEMIR FLEXTOUCH 100 UNIT/ML Pen INJECT 60 UNITS INTO THE SKIN DAILY AT 10 PM. 15 mL 1  . liraglutide (VICTOZA) 18 MG/3ML SOPN ADMINISTER 1.2MG  UNDER THE SKIN DAILY 3 pen 12  . metoprolol succinate (TOPROL-XL) 25 MG 24 hr tablet Take 1 tablet (25 mg total) by mouth daily. 90 tablet 3  . ONETOUCH VERIO test strip USE TO CHECK BLOOD SUGAR DAILY 100 each 12  . sertraline (ZOLOFT) 100 MG tablet TAKE 1 TABLET (100 MG TOTAL) EVERY MORNING BY MOUTH. 90 tablet 0  . simvastatin (ZOCOR) 40 MG tablet TAKE 1 TABLET BY MOUTH EVERYDAY AT BEDTIME 90 tablet 1  . Specialty Vitamins Products (VITA-RX DIABETIC VITAMIN) CAPS Take 1 packet by mouth daily. Diabetic Vitamins - 6 or 7 in 1 pack     No current facility-administered medications for this visit.     Review of Systems Review of Systems  Constitutional: Negative.   Respiratory: Negative.   Cardiovascular: Negative.     There were no vitals taken for this visit.  Physical Exam Physical Exam Constitutional:      Appearance: Normal appearance.  Skin:    General: Skin is warm and dry.  Neurological:     General: No focal deficit present.     Mental Status: She is alert and oriented to person, place, and time.     Data Reviewed ***  Assessment ***  Plan  Bone density is scheduled for 10/09/2018. We will call about RX .The patient has been asked to return to the office in six month with a bilateral diagnostic mammogram.  HPI, Physical Exam, Assessment and Plan have been scribed under the direction and in the presence of Hervey Ard, MD.  Gaspar Cola, CMA  Gaspar Cola 10/02/2018, 1:58 PM

## 2018-10-09 ENCOUNTER — Other Ambulatory Visit: Payer: Self-pay

## 2018-10-09 ENCOUNTER — Encounter: Payer: Self-pay | Admitting: Nurse Practitioner

## 2018-10-09 ENCOUNTER — Ambulatory Visit
Admission: RE | Admit: 2018-10-09 | Discharge: 2018-10-09 | Disposition: A | Discharge status: Home / Self Care | Payer: Medicare HMO | Source: Ambulatory Visit | Attending: Nurse Practitioner | Admitting: Nurse Practitioner

## 2018-10-09 DIAGNOSIS — Z780 Asymptomatic menopausal state: Secondary | ICD-10-CM | POA: Insufficient documentation

## 2018-10-09 DIAGNOSIS — M8580 Other specified disorders of bone density and structure, unspecified site: Secondary | ICD-10-CM | POA: Insufficient documentation

## 2018-10-09 DIAGNOSIS — M810 Age-related osteoporosis without current pathological fracture: Secondary | ICD-10-CM | POA: Insufficient documentation

## 2018-10-09 DIAGNOSIS — M8589 Other specified disorders of bone density and structure, multiple sites: Secondary | ICD-10-CM | POA: Diagnosis not present

## 2018-10-09 DIAGNOSIS — M8588 Other specified disorders of bone density and structure, other site: Secondary | ICD-10-CM | POA: Insufficient documentation

## 2018-10-10 ENCOUNTER — Encounter: Payer: Self-pay | Admitting: Nurse Practitioner

## 2018-10-10 NOTE — Progress Notes (Signed)
Letter for results 

## 2018-10-11 DIAGNOSIS — R69 Illness, unspecified: Secondary | ICD-10-CM | POA: Diagnosis not present

## 2018-10-13 ENCOUNTER — Encounter: Payer: Self-pay | Admitting: General Surgery

## 2018-10-14 ENCOUNTER — Telehealth: Payer: Self-pay | Admitting: Nurse Practitioner

## 2018-10-14 NOTE — Telephone Encounter (Signed)
Returned call to patient, no answer.  Left general HIPAA compliant message for patient to call provider. Have also sent out letter to her.

## 2018-10-14 NOTE — Telephone Encounter (Signed)
pls call pt back at [phone] re Bone Density test

## 2018-10-14 NOTE — Telephone Encounter (Signed)
Copied from [place] [id number]. Topic: General - Other >> Oct 14, 2018  3:06 PM Celene Kras A wrote: Reason for CRM: Pt called regarding her bone density test results. Pt states she got a phone call. Please advise.

## 2018-10-15 ENCOUNTER — Ambulatory Visit: Payer: Self-pay | Admitting: Pharmacist

## 2018-10-15 ENCOUNTER — Telehealth: Payer: Self-pay

## 2018-10-15 NOTE — Telephone Encounter (Signed)
Attempted to call and left general message.

## 2018-10-15 NOTE — Telephone Encounter (Signed)
Pt is calling back and would like bone density results ?

## 2018-10-15 NOTE — Telephone Encounter (Signed)
Left voicemail and let her know I would send letter.

## 2018-10-15 NOTE — Chronic Care Management (AMB) (Signed)
  Chronic Care Management   Note  10/15/2018 Name: Kara Leach MRN: [id number] DOB: 1951-02-15  Kara Leach is a 68 y.o. year old female who is a primary care patient of Cannady, Barbaraann Faster, NP. The CCM team was consulted for assistance with chronic disease management and care coordination needs.    Fourth unsuccessful outreach attempt to patient. Left HIPAA compliant message for her to return my call at her convenience to discuss CCM.   Follow up plan: - CCM team would be happy to assist in the care of this patient moving forward if she is interested.   Catie Darnelle Maffucci, PharmD Clinical Pharmacist [place] [phone]

## 2018-11-01 ENCOUNTER — Other Ambulatory Visit: Payer: Self-pay | Admitting: Nurse Practitioner

## 2018-11-04 ENCOUNTER — Other Ambulatory Visit: Payer: Self-pay | Admitting: Nurse Practitioner

## 2018-11-07 ENCOUNTER — Ambulatory Visit (INDEPENDENT_AMBULATORY_CARE_PROVIDER_SITE_OTHER): Payer: Medicare HMO | Admitting: Nurse Practitioner

## 2018-11-07 ENCOUNTER — Encounter: Payer: Self-pay | Admitting: Nurse Practitioner

## 2018-11-07 ENCOUNTER — Other Ambulatory Visit: Payer: Self-pay

## 2018-11-07 VITALS — BP 131/84 | HR 89 | Temp 98.2°F

## 2018-11-07 DIAGNOSIS — N183 Chronic kidney disease, stage 3 unspecified: Secondary | ICD-10-CM

## 2018-11-07 DIAGNOSIS — N182 Chronic kidney disease, stage 2 (mild): Secondary | ICD-10-CM | POA: Diagnosis not present

## 2018-11-07 DIAGNOSIS — Z794 Long term (current) use of insulin: Secondary | ICD-10-CM

## 2018-11-07 DIAGNOSIS — I1310 Hypertensive heart and chronic kidney disease without heart failure, with stage 1 through stage 4 chronic kidney disease, or unspecified chronic kidney disease: Secondary | ICD-10-CM

## 2018-11-07 DIAGNOSIS — E1169 Type 2 diabetes mellitus with other specified complication: Secondary | ICD-10-CM | POA: Diagnosis not present

## 2018-11-07 DIAGNOSIS — E785 Hyperlipidemia, unspecified: Secondary | ICD-10-CM

## 2018-11-07 DIAGNOSIS — E1122 Type 2 diabetes mellitus with diabetic chronic kidney disease: Secondary | ICD-10-CM | POA: Diagnosis not present

## 2018-11-07 LAB — BAYER DCA HB A1C WAIVED: HB A1C (BAYER DCA - WAIVED): 6.8 % (ref <7.0)

## 2018-11-07 NOTE — Progress Notes (Signed)
BP 131/84   Pulse 89   Temp 98.2 F (36.8 C) (Oral)   LMP  (LMP Unknown)   SpO2 96%    Subjective:    Patient ID: Kara Leach, female    DOB: [birth year], 68 y.o.   MRN: [id number]  HPI: Kara Leach is a 68 y.o. female  Chief Complaint  Patient presents with  . Depression  . Diabetes  . Hypertension   DIABETES Last A1C 7.2% in May.  Continues on Novolog pre meal and Levemir + Victoza 1.2 MG daily. Endorses she has not followed diet as well and has not been walking as much due to Covid 19 pandemic.   Hypoglycemic episodes:no Polydipsia/polyuria: no Visual disturbance: no Chest pain: no Paresthesias: no Glucose Monitoring: yes  Accucheck frequency: BID  Fasting glucose: 123 (averaging weekly 130's)  Post prandial:  Evening: 140's  Before meals: Taking Insulin?: yes  Long acting insulin: Levemir 60 units  Short acting insulin: Novolog 15 units Blood Pressure Monitoring: daily Retinal Examination: Not up to Date Foot Exam: Up to Date Pneumovax: Up to Date Influenza: Up to Date Aspirin: yes   HYPERTENSION / HYPERLIPIDEMIA Continues on Amlodipine, Enalapril, Metoprolol, and ASA + Simvastatin. Satisfied with current treatment? yes Duration of hypertension: chronic BP monitoring frequency: daily BP range: 132/70 this morning, reports average 130's over 70's at home BP medication side effects: no Duration of hyperlipidemia: chronic Cholesterol medication side effects: no Cholesterol supplements: none Medication compliance: good compliance Aspirin: yes Recent stressors: no Recurrent headaches: no Visual changes: no Palpitations: no Dyspnea: no Chest pain: no Lower extremity edema: no Dizzy/lightheaded: no   CHRONIC KIDNEY DISEASE With GFR ranging from 48-56 over past two years. CKD status: stable Medications renally dose: yes Previous renal evaluation: no Pneumovax:  Up to Date Influenza Vaccine:  Up to Date  Relevant past medical, surgical,  family and social history reviewed and updated as indicated. Interim medical history since our last visit reviewed. Allergies and medications reviewed and updated.  Review of Systems  Constitutional: Negative for activity change, appetite change, diaphoresis, fatigue and fever.  Respiratory: Negative for cough, chest tightness and shortness of breath.   Cardiovascular: Negative for chest pain, palpitations and leg swelling.  Gastrointestinal: Negative for abdominal distention, abdominal pain, constipation, diarrhea, nausea and vomiting.  Endocrine: Negative for cold intolerance, heat intolerance, polydipsia, polyphagia and polyuria.  Neurological: Negative for dizziness, syncope, weakness, light-headedness, numbness and headaches.  Psychiatric/Behavioral: Negative.     Per HPI unless specifically indicated above     Objective:    BP 131/84   Pulse 89   Temp 98.2 F (36.8 C) (Oral)   LMP  (LMP Unknown)   SpO2 96%   Wt Readings from Last 3 Encounters:  10/02/18 158 lb (71.7 kg)  08/28/18 159 lb (72.1 kg)  08/13/18 161 lb (73 kg)    Physical Exam Vitals signs and nursing note reviewed.  Constitutional:      General: She is awake. She is not in acute distress.    Appearance: She is well-developed. She is not ill-appearing.  HENT:     Head: Normocephalic.     Right Ear: Hearing normal.     Left Ear: Hearing normal.  Eyes:     General: Lids are normal.        Right eye: No discharge.        Left eye: No discharge.     Conjunctiva/sclera: Conjunctivae normal.     Pupils: Pupils are equal, round,  and reactive to light.  Neck:     Musculoskeletal: Normal range of motion and neck supple.     Thyroid: No thyromegaly.     Vascular: No carotid bruit.  Cardiovascular:     Rate and Rhythm: Normal rate and regular rhythm.     Heart sounds: Normal heart sounds. No murmur. No gallop.   Pulmonary:     Effort: Pulmonary effort is normal. No accessory muscle usage or respiratory  distress.     Breath sounds: Normal breath sounds.  Abdominal:     General: Bowel sounds are normal.     Palpations: Abdomen is soft.     Tenderness: There is no abdominal tenderness.  Musculoskeletal:     Right lower leg: No edema.     Left lower leg: No edema.  Lymphadenopathy:     Cervical: No cervical adenopathy.  Skin:    General: Skin is warm and dry.  Neurological:     Mental Status: She is alert and oriented to person, place, and time.  Psychiatric:        Attention and Perception: Attention normal.        Mood and Affect: Mood normal.        Speech: Speech normal.        Behavior: Behavior normal. Behavior is cooperative.        Thought Content: Thought content normal.        Judgment: Judgment normal.    Diabetic Foot Exam - Simple   Simple Foot Form Visual Inspection No deformities, no ulcerations, no other skin breakdown bilaterally: Yes Sensation Testing Intact to touch and monofilament testing bilaterally: Yes Pulse Check Posterior Tibialis and Dorsalis pulse intact bilaterally: Yes Comments    Results for orders placed or performed during the hospital encounter of 08/13/18  Glucose, capillary  Result Value Ref Range   Glucose-Capillary 177 (H) 70 - 99 mg/dL  Glucose, capillary  Result Value Ref Range   Glucose-Capillary 135 (H) 70 - 99 mg/dL  Surgical pathology  Result Value Ref Range   SURGICAL PATHOLOGY      Surgical Pathology CASE: [id number] PATIENT: Kara Leach Surgical Pathology Report     SPECIMEN SUBMITTED: A. Breast, left lower outer quadrant; wide excision  CLINICAL HISTORY: None provided  PRE-OPERATIVE DIAGNOSIS: Radial scar left breast  POST-OPERATIVE DIAGNOSIS: Same as pre-op     DIAGNOSIS: A.  BREAST, LEFT LOWER OUTER QUADRANT; WIDE EXCISION WITH NEEDLE LOCALIZATION: - COMPLEX PROLIFERATIVE LESION (FOCI OF ADENOSIS, USUAL AND ATYPICAL EPITHELIAL HYPERPLASIA, APOCRINE METAPLASIA, PAPILLARY CHANGES, CYSTIC  CHANGES AND FIBROSIS), SEE COMMENT. - HISTOPATHOLOGICAL FINDINGS (FIBROSIS WITH PRIOR HEMORRHAGE AND FOREIGN BODY TYPE NON-NECROTIZING GRANULOMATOUS NODULE) CONSISTENT WITH PRIOR BIOPSY SITES (ARS [id number] and [phone]). - MICROCALCIFICATIONS ARE IDENTIFIED. - AN INTRAMAMMARY LYMPH NODE IS NOT IDENTIFIED. - DUCTAL CARCINOMA IN SITU AND AN INVASIVE CARCINOMA ARE NOT PRESENT.  Comment: This wide excision shows benign complex changes a s noted in the diagnosis.  These complex changes could be from a single, localized lesion or as a part of a more diffuse mammary process, such as a proliferative fibrocystic mastopathy. Correlation with the clinical data and the imaging data is encouraged.  Further, the patient's recent biopsy ([id number]) was examined.  This shows benign changes.  Again, ductal carcinoma in situ and an invasive carcinoma are not identified. I appreciate the review and examination of this patient's tissue by Drs. Ol[place] and [place].  GROSS DESCRIPTION: A. Labeled: Left lower outer quadrant wide excision Received: Fresh and placed  into formalin Accompanying specimen radiograph: Yes Radiographic findings: One needle localization wire and 2 biopsy clips are present. Time in fixative: Collected at 11:12 AM and placed into formalin at 11:25 AM on 08/13/2018.  Cold ischemic time: Less than 1 hour Total fixation time: 30 hours Type of procedure: Oriented breast lumpectomy with needle loca lization Location / laterality of specimen: Left breast, lower outer quadrant Orientation of specimen: The specimen is oriented with surgical metallic markers designating cranial and medial. Inking: Superior = blue Inferior = green Medial = yellow Lateral = orange Posterior = black Anterior/Superficial = red Size of specimen: 10.2 (superior-inferior) x 8.2 (medial-lateral) x 2.1 cm (anterior-posterior) Skin: Absent Biopsy site: 2; designated as biopsy sites #1 and #2  Number of  discrete masses: No distinct mass lesion is grossly identified. Size of biopsy sites: Biopsy site #1 - 0.5 x 0.4 x 0.3 cm.  Biopsy site #2 - 0.2 x 0.2 x 0.2 cm. Description of biopsy sites: Sectioning displays 2 distinct, focal areas of hemorrhage (suspicious for biopsy site change).  Embedded within biopsy site #1 is a coil-shaped biopsy clip.  Embedded within biopsy site #2 is an omega-shaped biopsy clip.  Surrounding and involving both biopsy sites is ill-defined, slightly irregular an d indurated fibrous tissue with an overall measurement of 4.0 x 3.1 x 1.7 cm.  No distinct associated mass lesion is identified with either biopsy clip site. Distance between biopsy sites/clips: Biopsy site #1 is located 1.5 cm superior to biopsy site #2. Margins for biopsy site #1: Superior - 3.0 cm, inferior - 6.8 cm, medial - 1.5 cm, lateral - 5.1 cm, anterior - 0.4 cm, and posterior - 1.8 cm. Margins for biopsy site #2: Superior - 5.8 cm, inferior - 4.8 cm, medial - 1.2 cm, lateral - 4.5 cm, anterior - 1.0 cm, and posterior - 1.1 cm. Margins for irregular fibrous tissue surrounding biopsy sites #1-#2: Superior - 2.9 cm, inferior - 4.2 cm, medial - 0.5 cm, lateral - 3.5 cm, anterior - less than 0.1 cm, and posterior - 1.2 cm. Description of remainder of tissue: Sectioning the remainder of the specimen displays tan-yellow, lobulated, otherwise grossly unremarkable fibroadipose tissue with a fibrous to adipose ratio of 5:95.  No additional abnormalities or mass lesions  are grossly identified.  Block summary: 1-17 - entire irregular fibrous tissue with biopsy sites #1 and #2; in relation to anterior, posterior, and medial resection margins    - Submitted from superior to inferior    - Biopsy site #1 area in cassettes 1-5 (coil shaped clip in cassette 4)    - Breast parenchyma between biopsy sites #1 and #2 in cassettes 6-9    - Biopsy site #2 area in cassettes 10-12 (omega shaped clip in cassette  10) 18 - lateral resection margin closest to biopsy sites/irregular fibrous tissue 19 - superior resection margin closest to biopsy sites/irregular fibrous tissue 20 - inferior resection margin closest to biopsy sites/irregular fibrous tissue  Final Diagnosis performed by Vanessa Kick, MD.   Electronically signed 08/20/2018 4:09:16PM The electronic signature indicates that the named Attending Pathologist has evaluated the specimen  Technical component performed at [facility], [address] [place], [place] [address] Lab: [phone] Dir: Sa Lenard Galloway, MD, MMM  Professional component performed at [facility], [facility], [place], [place], [place] [address] Lab: [phone] Dir: Dellia Nims. Reuel Derby, MD       Assessment & Plan:   Problem List Items Addressed This Visit  Cardiovascular and Mediastinum   Hypertensive heart/kidney disease without HF and with CKD stage III (HCC)    Chronic, ongoing with CKD 3.  Continue current medication regimen and adjust as needed.  Enalapril for kidney protection.  CMP today.        Endocrine   Hyperlipidemia associated with type 2 diabetes mellitus (HCC)    Chronic, ongoing.  Continue current medication regimen and adjust as needed.  CMP today, lipid panel next visit.      Relevant Orders   Comprehensive metabolic panel   Controlled type 2 diabetes mellitus with chronic kidney disease, with long-term current use of insulin (HCC) - Primary    Chronic, ongoing with A1C improved today at 6.8%.  Continue current medication regimen.  CMP today.  Continue to check BS at home daily and document.  Praised for improvement in A1C.  Return in 3 months for annual physical.      Relevant Orders   Bayer DCA Hb A1c Waived   Comprehensive metabolic panel     Genitourinary   CKD (chronic kidney disease), stage III (HCC)    Chronic, stable levels with CKD 3.  Enalapril for kidney protection.  CMP today.  Consider nephrology  referral if worsening.      Relevant Orders   Comprehensive metabolic panel       Follow up plan: Return in about 3 months (around 02/07/2019) for Annual Physical.

## 2018-11-07 NOTE — Patient Instructions (Signed)
Carbohydrate Counting for Diabetes Mellitus, Adult  Carbohydrate counting is a method of keeping track of how many carbohydrates you eat. Eating carbohydrates naturally increases the amount of sugar (glucose) in the blood. Counting how many carbohydrates you eat helps keep your blood glucose within normal limits, which helps you manage your diabetes (diabetes mellitus). It is important to know how many carbohydrates you can safely have in each meal. This is different for every person. A diet and nutrition specialist (registered dietitian) can help you make a meal plan and calculate how many carbohydrates you should have at each meal and snack. Carbohydrates are found in the following foods:  Grains, such as breads and cereals.  Dried beans and soy products.  Starchy vegetables, such as potatoes, peas, and corn.  Fruit and fruit juices.  Milk and yogurt.  Sweets and snack foods, such as cake, cookies, candy, chips, and soft drinks. How do I count carbohydrates? There are two ways to count carbohydrates in food. You can use either of the methods or a combination of both. Reading "Nutrition Facts" on packaged food The "Nutrition Facts" list is included on the labels of almost all packaged foods and beverages in the [place] It includes:  The serving size.  Information about nutrients in each serving, including the grams (g) of carbohydrate per serving. To use the "Nutrition Facts":  Decide how many servings you will have.  Multiply the number of servings by the number of carbohydrates per serving.  The resulting number is the total amount of carbohydrates that you will be having. Learning standard serving sizes of other foods When you eat carbohydrate foods that are not packaged or do not include "Nutrition Facts" on the label, you need to measure the servings in order to count the amount of carbohydrates:  Measure the foods that you will eat with a food scale or measuring cup, if needed.   Decide how many standard-size servings you will eat.  Multiply the number of servings by 15. Most carbohydrate-rich foods have about 15 g of carbohydrates per serving. ? For example, if you eat 8 oz (170 g) of strawberries, you will have eaten 2 servings and 30 g of carbohydrates (2 servings x 15 g = 30 g).  For foods that have more than one food mixed, such as soups and casseroles, you must count the carbohydrates in each food that is included. The following list contains standard serving sizes of common carbohydrate-rich foods. Each of these servings has about 15 g of carbohydrates:   hamburger bun or  English muffin.   oz (15 mL) syrup.   oz (14 g) jelly.  1 slice of bread.  1 six-inch tortilla.  3 oz (85 g) cooked rice or pasta.  4 oz (113 g) cooked dried beans.  4 oz (113 g) starchy vegetable, such as peas, corn, or potatoes.  4 oz (113 g) hot cereal.  4 oz (113 g) mashed potatoes or  of a large baked potato.  4 oz (113 g) canned or frozen fruit.  4 oz (120 mL) fruit juice.  4-6 crackers.  6 chicken nuggets.  6 oz (170 g) unsweetened dry cereal.  6 oz (170 g) plain fat-free yogurt or yogurt sweetened with artificial sweeteners.  8 oz (240 mL) milk.  8 oz (170 g) fresh fruit or one small piece of fruit.  24 oz (680 g) popped popcorn. Example of carbohydrate counting Sample meal  3 oz (85 g) chicken breast.  6 oz (170 g)   brown rice.  4 oz (113 g) corn.  8 oz (240 mL) milk.  8 oz (170 g) strawberries with sugar-free whipped topping. Carbohydrate calculation 1. Identify the foods that contain carbohydrates: ? Rice. ? Corn. ? Milk. ? Strawberries. 2. Calculate how many servings you have of each food: ? 2 servings rice. ? 1 serving corn. ? 1 serving milk. ? 1 serving strawberries. 3. Multiply each number of servings by 15 g: ? 2 servings rice x 15 g = 30 g. ? 1 serving corn x 15 g = 15 g. ? 1 serving milk x 15 g = 15 g. ? 1 serving  strawberries x 15 g = 15 g. 4. Add together all of the amounts to find the total grams of carbohydrates eaten: ? 30 g + 15 g + 15 g + 15 g = 75 g of carbohydrates total. Summary  Carbohydrate counting is a method of keeping track of how many carbohydrates you eat.  Eating carbohydrates naturally increases the amount of sugar (glucose) in the blood.  Counting how many carbohydrates you eat helps keep your blood glucose within normal limits, which helps you manage your diabetes.  A diet and nutrition specialist (registered dietitian) can help you make a meal plan and calculate how many carbohydrates you should have at each meal and snack. This information is not intended to replace advice given to you by your health care provider. Make sure you discuss any questions you have with your health care provider. Document Released: 03/05/2005 Document Revised: 09/27/2016 Document Reviewed: 08/17/2015 Elsevier Patient Education  2020 [employer]  

## 2018-11-07 NOTE — Assessment & Plan Note (Addendum)
Chronic, ongoing with A1C improved today at 6.8%.  Continue current medication regimen.  CMP today.  Continue to check BS at home daily and document.  Praised for improvement in A1C.  Return in 3 months for annual physical.

## 2018-11-07 NOTE — Assessment & Plan Note (Signed)
Chronic, stable levels with CKD 3.  Enalapril for kidney protection.  CMP today.  Consider nephrology referral if worsening.

## 2018-11-07 NOTE — Assessment & Plan Note (Signed)
Chronic, ongoing.  Continue current medication regimen and adjust as needed.  CMP today, lipid panel next visit.

## 2018-11-07 NOTE — Assessment & Plan Note (Signed)
Chronic, ongoing with CKD 3.  Continue current medication regimen and adjust as needed.  Enalapril for kidney protection.  CMP today.

## 2018-11-08 LAB — COMPREHENSIVE METABOLIC PANEL
ALT: 42 IU/L — ABNORMAL HIGH (ref 0–32)
AST: 70 IU/L — ABNORMAL HIGH (ref 0–40)
Albumin/Globulin Ratio: 1.5 (ref 1.2–2.2)
Albumin: 4.5 g/dL (ref 3.8–4.8)
Alkaline Phosphatase: 119 IU/L — ABNORMAL HIGH (ref 39–117)
BUN/Creatinine Ratio: 19 (ref 12–28)
BUN: 21 mg/dL (ref 8–27)
Bilirubin Total: 0.2 mg/dL (ref 0.0–1.2)
CO2: 21 mmol/L (ref 20–29)
Calcium: 9.5 mg/dL (ref 8.7–10.3)
Chloride: 102 mmol/L (ref 96–106)
Creatinine, Ser: 1.11 mg/dL — ABNORMAL HIGH (ref 0.57–1.00)
GFR calc Af Amer: 60 mL/min/{1.73_m2} (ref >59)
GFR calc non Af Amer: 52 mL/min/{1.73_m2} — ABNORMAL LOW (ref >59)
Globulin, Total: 3.1 g/dL (ref 1.5–4.5)
Glucose: 119 mg/dL — ABNORMAL HIGH (ref 65–99)
Potassium: 5.1 mmol/L (ref 3.5–5.2)
Sodium: 142 mmol/L (ref 134–144)
Total Protein: 7.6 g/dL (ref 6.0–8.5)

## 2018-11-10 ENCOUNTER — Encounter: Payer: Self-pay | Admitting: Nurse Practitioner

## 2018-11-10 NOTE — Progress Notes (Signed)
Result letter

## 2018-11-26 ENCOUNTER — Other Ambulatory Visit: Payer: Self-pay | Admitting: Family Medicine

## 2018-11-26 DIAGNOSIS — J309 Allergic rhinitis, unspecified: Secondary | ICD-10-CM

## 2018-12-05 ENCOUNTER — Other Ambulatory Visit: Payer: Self-pay | Admitting: Unknown Physician Specialty

## 2018-12-08 ENCOUNTER — Ambulatory Visit: Payer: Medicare HMO

## 2019-01-05 ENCOUNTER — Other Ambulatory Visit: Payer: Self-pay | Admitting: Unknown Physician Specialty

## 2019-01-08 ENCOUNTER — Other Ambulatory Visit: Payer: Self-pay | Admitting: Nurse Practitioner

## 2019-01-08 DIAGNOSIS — R69 Illness, unspecified: Secondary | ICD-10-CM | POA: Diagnosis not present

## 2019-01-09 ENCOUNTER — Other Ambulatory Visit: Payer: Self-pay | Admitting: Family Medicine

## 2019-01-13 ENCOUNTER — Other Ambulatory Visit: Payer: Self-pay

## 2019-01-13 ENCOUNTER — Inpatient Hospital Stay: Payer: Medicare HMO | Attending: Oncology

## 2019-01-13 DIAGNOSIS — D472 Monoclonal gammopathy: Secondary | ICD-10-CM | POA: Diagnosis not present

## 2019-01-13 LAB — CBC WITH DIFFERENTIAL/PLATELET
Abs Immature Granulocytes: 0.12 10*3/uL — ABNORMAL HIGH (ref 0.00–0.07)
Basophils Absolute: 0.1 10*3/uL (ref 0.0–0.1)
Basophils Relative: 1 %
Eosinophils Absolute: 0.3 10*3/uL (ref 0.0–0.5)
Eosinophils Relative: 3 %
HCT: 39.1 % (ref 36.0–46.0)
Hemoglobin: 12.8 g/dL (ref 12.0–15.0)
Immature Granulocytes: 1 %
Lymphocytes Relative: 39 %
Lymphs Abs: 3.7 10*3/uL (ref 0.7–4.0)
MCH: 30.7 pg (ref 26.0–34.0)
MCHC: 32.7 g/dL (ref 30.0–36.0)
MCV: 93.8 fL (ref 80.0–100.0)
Monocytes Absolute: 0.7 10*3/uL (ref 0.1–1.0)
Monocytes Relative: 7 %
Neutro Abs: 4.5 10*3/uL (ref 1.7–7.7)
Neutrophils Relative %: 49 %
Platelets: 328 10*3/uL (ref 150–400)
RBC: 4.17 MIL/uL (ref 3.87–5.11)
RDW: 13.7 % (ref 11.5–15.5)
WBC: 9.3 10*3/uL (ref 4.0–10.5)
nRBC: 0 % (ref 0.0–0.2)

## 2019-01-13 LAB — BASIC METABOLIC PANEL
Anion gap: 10 (ref 5–15)
BUN: 20 mg/dL (ref 8–23)
CO2: 24 mmol/L (ref 22–32)
Calcium: 9.2 mg/dL (ref 8.9–10.3)
Chloride: 105 mmol/L (ref 98–111)
Creatinine, Ser: 1.12 mg/dL — ABNORMAL HIGH (ref 0.44–1.00)
GFR calc Af Amer: 60 mL/min — ABNORMAL LOW (ref >60)
GFR calc non Af Amer: 52 mL/min — ABNORMAL LOW (ref >60)
Glucose, Bld: 120 mg/dL — ABNORMAL HIGH (ref 70–99)
Potassium: 4.4 mmol/L (ref 3.5–5.1)
Sodium: 139 mmol/L (ref 135–145)

## 2019-01-14 ENCOUNTER — Inpatient Hospital Stay: Payer: Medicare HMO

## 2019-01-14 LAB — PROTEIN ELECTROPHORESIS, SERUM
A/G Ratio: 1.1 (ref 0.7–1.7)
Albumin ELP: 3.8 g/dL (ref 2.9–4.4)
Alpha-1-Globulin: 0.3 g/dL (ref 0.0–0.4)
Alpha-2-Globulin: 1.1 g/dL — ABNORMAL HIGH (ref 0.4–1.0)
Beta Globulin: 0.9 g/dL (ref 0.7–1.3)
Gamma Globulin: 1.2 g/dL (ref 0.4–1.8)
Globulin, Total: 3.5 g/dL (ref 2.2–3.9)
M-Spike, %: 0.8 g/dL — ABNORMAL HIGH
Total Protein ELP: 7.3 g/dL (ref 6.0–8.5)

## 2019-01-14 LAB — KAPPA/LAMBDA LIGHT CHAINS
Kappa free light chain: 46.7 mg/L — ABNORMAL HIGH (ref 3.3–19.4)
Kappa, lambda light chain ratio: 2.32 — ABNORMAL HIGH (ref 0.26–1.65)
Lambda free light chains: 20.1 mg/L (ref 5.7–26.3)

## 2019-01-15 LAB — IGG, IGA, IGM
IgA: 71 mg/dL — ABNORMAL LOW (ref 87–352)
IgG (Immunoglobin G), Serum: 1334 mg/dL (ref 586–1602)
IgM (Immunoglobulin M), Srm: 106 mg/dL (ref 26–217)

## 2019-01-18 NOTE — Progress Notes (Signed)
[place]  [phone] [phone]  ID: Kara Leach OB: [birth year]  MR#: [id number]  PRF#:[id number]  Patient Care Team: Venita Lick, NP as PCP - General (Nurse Practitioner) Robert Bellow, MD (General Surgery) Anthonette Legato, MD (Internal Medicine) Leanor Kail, MD (Inactive) (Orthopedic Surgery) Lloyd Huger, MD as Consulting Physician (Oncology) De Hollingshead, [facility] as Pharmacist (Pharmacist)  CHIEF COMPLAINT: MGUS  INTERVAL HISTORY: Patient returns to clinic today for repeat laboratory can routine yearly evaluation.  She continues to feel well and remains asymptomatic. She has no neurologic complaints.  She denies any recent fevers or illnesses.  She has a good appetite and denies weight loss.  She denies any pain.  She has no chest pain, shortness of breath, cough, or hemoptysis.  She denies any nausea, vomiting, constipation, or diarrhea.  She has no urinary complaints.  Patient feels at her baseline and offers no specific complaints today.  REVIEW OF SYSTEMS:   Review of Systems  Constitutional: Negative.  Negative for fever, malaise/fatigue and weight loss.  Respiratory: Negative.  Negative for shortness of breath.   Cardiovascular: Negative.  Negative for chest pain and leg swelling.  Gastrointestinal: Negative.  Negative for abdominal pain.  Genitourinary: Negative.  Negative for dysuria.  Musculoskeletal: Negative.  Negative for back pain.  Skin: Negative.  Negative for rash.  Neurological: Negative.  Negative for sensory change, focal weakness, weakness and headaches.  Psychiatric/Behavioral: Negative.  The patient is not nervous/anxious.     As per HPI. Otherwise, a complete review of systems is negative.  PAST MEDICAL HISTORY: Past Medical History:  Diagnosis Date  . Allergy   . Anxiety   . Arthritis    Osteoarthritis  . Cancer ([place]) 05/2018   possibly on left breast but not sure  . Chronic  kidney disease   . Depression   . Diabetes mellitus without complication ([place]) 9357  . Diabetic neuropathy (HCC)    feet  . Diverticulitis 2017  . GERD (gastroesophageal reflux disease)   . Headache    sinus and migraines on occasion  . Hyperlipidemia   . Hypertension   . Last menstrual period (LMP) > 10 days ago 1986  . Obesity   . Peripheral vascular disease (HCC)    neuropathy in hands and feet d/t diabetes  . Renal insufficiency    Stage 2 kidney disease  . Seizures ([place])    due to high blood sugars. last time = 09/2014  . Sleep apnea    has CPAP, can't tolerate  . Stroke ([place]) 1980   minor. left side remains weaker than right  . Wears hearing aid    right side  . Wrist weakness    left - after fracture and repair    PAST SURGICAL HISTORY: Past Surgical History:  Procedure Laterality Date  . ABDOMINAL HYSTERECTOMY  1986  . APPENDECTOMY  1964  . BREAST BIOPSY Left 07/03/2016   benign, fibrocystic changes with calcifications, usual ductal hyperplasia.   Marland Kitchen BREAST BIOPSY Left 04/23/2018   radial scar  . BREAST BIOPSY Left 08/13/2018   Procedure: WIDE LOCALIZATION AND OPEN BIOPSY OF LEFT BREAST, DIABETIC;  Surgeon: Robert Bellow, MD;  Location: [facility] ORS;  Service: General;  Laterality: Left;  . BREAST EXCISIONAL BIOPSY Left 08/13/2018  . CHOLECYSTECTOMY  2012  . COLONOSCOPY WITH PROPOFOL N/A 12/01/2015   Procedure: COLONOSCOPY WITH PROPOFOL;  Surgeon: [name], MD;  Location: [facility];  Service: Endoscopy;  Laterality: N/A;  Diabetic - insulin Sleep apnea  . PATELLA FRACTURE SURGERY Left    1. had to recenter kneecap, 2 and 3 surgeries were arthroscopies  . WRIST FRACTURE SURGERY Left 2005   Steel plate    FAMILY HISTORY Family History  Problem Relation Age of Onset  . Diabetes Mother   . Heart disease Mother   . Hypertension Mother   . Diabetes Sister   . Cancer Father        stomach  . Arthritis Sister   . Stroke Sister   . Hypertension  Sister   . Breast cancer Maternal Aunt 59       great aunt       ADVANCED DIRECTIVES:    HEALTH MAINTENANCE: Social History   Tobacco Use  . Smoking status: Never Smoker  . Smokeless tobacco: Never Used  Substance Use Topics  . Alcohol use: Not Currently    Alcohol/week: 0.0 standard drinks    Comment: occasionally holiday, weddings  . Drug use: No     Colonoscopy:  PAP:  Bone density:  Lipid panel:  Allergies  Allergen Reactions  . Strawberry Extract Swelling    Lips swell and rash/whelps over body Difficulty breathing  . Ibuprofen Other (See Comments)    "ringing in ears", dizziness Also, Advil / Aleve / Motrin    Current Outpatient Medications  Medication Sig Dispense Refill  . acetaminophen (TYLENOL ARTHRITIS PAIN) 650 MG CR tablet Take 650 mg by mouth every 8 (eight) hours as needed for pain.    Marland Kitchen allopurinol (ZYLOPRIM) 300 MG tablet Take 1 tablet (300 mg total) by mouth daily. 90 tablet 3  . amLODipine (NORVASC) 5 MG tablet Take 1 tablet (5 mg total) by mouth daily. 90 tablet 3  . aspirin EC 81 MG tablet Take 81 mg by mouth daily.    . BD PEN NEEDLE NANO U/F 32G X 4 MM MISC USE EVERY MORNING 100 each 12  . Calcium Carb-Cholecalciferol (CALCIUM 600 + D PO) Take 1 tablet by mouth 2 (two) times daily.    . enalapril (VASOTEC) 2.5 MG tablet TAKE 1 TABLET BY MOUTH EVERY DAY 90 tablet 1  . Famotidine (PEPCID PO) Take by mouth 2 (two) times daily.    . fluticasone (FLONASE) 50 MCG/ACT nasal spray PLACE 2 SPRAYS INTO BOTH NOSTRILS DAILY AS NEEDED FOR ALLERGIES. 48 mL 4  . insulin aspart (NOVOLOG FLEXPEN) 100 UNIT/ML FlexPen INJECT 15U UNDER THE SKIN 3 TIMES DAILY WITH MEALS 15 pen 12  . LEVEMIR FLEXTOUCH 100 UNIT/ML Pen INJECT 60 UNITS INTO THE SKIN DAILY AT 10 PM. 15 mL 1  . liraglutide (VICTOZA) 18 MG/3ML SOPN ADMINISTER 1.2MG UNDER THE SKIN DAILY 3 pen 12  . metoprolol succinate (TOPROL-XL) 25 MG 24 hr tablet Take 1 tablet (25 mg total) by mouth daily. 90 tablet 3   . ONETOUCH ULTRA test strip USE TO CHECK BLOOD SUGAR DAILY 100 strip 12  . sertraline (ZOLOFT) 100 MG tablet TAKE 1 TABLET (100 MG TOTAL) EVERY MORNING BY MOUTH. 90 tablet 0  . simvastatin (ZOCOR) 40 MG tablet TAKE 1 TABLET BY MOUTH EVERYDAY AT BEDTIME 90 tablet 1  . Specialty Vitamins Products (VITA-RX DIABETIC VITAMIN) CAPS Take 1 packet by mouth daily. Diabetic Vitamins - 6 or 7 in 1 pack     No current facility-administered medications for this visit.     OBJECTIVE: Vitals:   01/19/19 1019  BP: 115/66  Pulse: (!) 107  Resp: 18  Temp: 98.1 F (36.7  C)  SpO2: 97%     Body mass index is 31.91 kg/m.    ECOG FS:0 - Asymptomatic  General: Well-developed, well-nourished, no acute distress. Eyes: Pink conjunctiva, anicteric sclera. HEENT: Normocephalic, moist mucous membranes. Lungs: Clear to auscultation bilaterally. Heart: Regular rate and rhythm. No rubs, murmurs, or gallops. Abdomen: Soft, nontender, nondistended. No organomegaly noted, normoactive bowel sounds. Musculoskeletal: No edema, cyanosis, or clubbing. Neuro: Alert, answering all questions appropriately. Cranial nerves grossly intact. Skin: No rashes or petechiae noted. Psych: Normal affect.  LAB RESULTS:  Lab Results  Component Value Date   NA 139 01/13/2019   K 4.4 01/13/2019   CL 105 01/13/2019   CO2 24 01/13/2019   GLUCOSE 120 (H) 01/13/2019   BUN 20 01/13/2019   CREATININE 1.12 (H) 01/13/2019   CALCIUM 9.2 01/13/2019   PROT 7.6 11/07/2018   ALBUMIN 4.5 11/07/2018   AST 70 (H) 11/07/2018   ALT 42 (H) 11/07/2018   ALKPHOS 119 (H) 11/07/2018   BILITOT 0.2 11/07/2018   GFRNONAA 52 (L) 01/13/2019   GFRAA 60 (L) 01/13/2019    Lab Results  Component Value Date   WBC 9.3 01/13/2019   NEUTROABS 4.5 01/13/2019   HGB 12.8 01/13/2019   HCT 39.1 01/13/2019   MCV 93.8 01/13/2019   PLT 328 01/13/2019   Lab Results  Component Value Date   TOTALPROTELP 7.3 01/13/2019   ALBUMINELP 3.8 01/13/2019   A1GS  0.3 01/13/2019   A2GS 1.1 (H) 01/13/2019   BETS 0.9 01/13/2019   GAMS 1.2 01/13/2019   MSPIKE 0.8 (H) 01/13/2019   SPEI Comment 01/13/2019     STUDIES: No results found.  ASSESSMENT: MGUS  PLAN:    1.  MGUS: Upon review of patient's chart, her M spike was 0.3 in August 2013 and then 0.5 in March 2015.  Her M spike has now plateaued at 0.8.  She does not have an M spike in her urine.  Her immunoglobulins are essentially within normal limits.  Her kappa/lambda light chain ratio is stable at 2.32.  She has no evidence of endorgan damage. Previously, metastatic bone survey was reported as negative.  Patient does not require bone marrow biopsy at this time.  No intervention is needed.  Return to clinic in 1 year with repeat laboratory work and further evaluation. 2.  Chronic renal insufficiency: Patient's creatinine is only mildly elevated at 1.12.  Monitor. 3.  Hyperglycemia: Patient has improved blood glucose control. 4.  Peripheral neuropathy: Secondary to diabetes.  Patient does not complain of this today.     Patient expressed understanding and was in agreement with this plan. She also understands that She can call clinic at any time with any questions, concerns, or complaints.   Lloyd Huger, MD   01/20/2019 2:29 PM

## 2019-01-19 ENCOUNTER — Other Ambulatory Visit: Payer: Self-pay

## 2019-01-19 NOTE — Progress Notes (Signed)
Patient prescreened for appointment. Patient has no concerns or questions.  

## 2019-01-20 ENCOUNTER — Inpatient Hospital Stay: Payer: Medicare HMO | Attending: Oncology | Admitting: Oncology

## 2019-01-20 ENCOUNTER — Other Ambulatory Visit: Payer: Self-pay

## 2019-01-20 VITALS — BP 115/66 | HR 107 | Temp 98.1°F | Resp 18 | Wt 158.0 lb

## 2019-01-20 DIAGNOSIS — E1142 Type 2 diabetes mellitus with diabetic polyneuropathy: Secondary | ICD-10-CM | POA: Insufficient documentation

## 2019-01-20 DIAGNOSIS — D472 Monoclonal gammopathy: Secondary | ICD-10-CM | POA: Diagnosis not present

## 2019-01-20 DIAGNOSIS — I10 Essential (primary) hypertension: Secondary | ICD-10-CM | POA: Insufficient documentation

## 2019-01-20 DIAGNOSIS — F419 Anxiety disorder, unspecified: Secondary | ICD-10-CM | POA: Diagnosis not present

## 2019-01-20 DIAGNOSIS — Z800 Family history of malignant neoplasm of digestive organs: Secondary | ICD-10-CM | POA: Insufficient documentation

## 2019-01-20 DIAGNOSIS — E785 Hyperlipidemia, unspecified: Secondary | ICD-10-CM | POA: Insufficient documentation

## 2019-01-20 DIAGNOSIS — Z794 Long term (current) use of insulin: Secondary | ICD-10-CM | POA: Insufficient documentation

## 2019-01-20 DIAGNOSIS — Z79899 Other long term (current) drug therapy: Secondary | ICD-10-CM | POA: Diagnosis not present

## 2019-01-20 DIAGNOSIS — Z8673 Personal history of transient ischemic attack (TIA), and cerebral infarction without residual deficits: Secondary | ICD-10-CM | POA: Diagnosis not present

## 2019-01-20 DIAGNOSIS — Z8249 Family history of ischemic heart disease and other diseases of the circulatory system: Secondary | ICD-10-CM | POA: Insufficient documentation

## 2019-01-20 DIAGNOSIS — R69 Illness, unspecified: Secondary | ICD-10-CM | POA: Diagnosis not present

## 2019-01-20 DIAGNOSIS — N182 Chronic kidney disease, stage 2 (mild): Secondary | ICD-10-CM | POA: Insufficient documentation

## 2019-01-20 DIAGNOSIS — F329 Major depressive disorder, single episode, unspecified: Secondary | ICD-10-CM | POA: Insufficient documentation

## 2019-01-20 DIAGNOSIS — Z90710 Acquired absence of both cervix and uterus: Secondary | ICD-10-CM | POA: Diagnosis not present

## 2019-01-20 DIAGNOSIS — Z803 Family history of malignant neoplasm of breast: Secondary | ICD-10-CM | POA: Insufficient documentation

## 2019-01-20 DIAGNOSIS — E1165 Type 2 diabetes mellitus with hyperglycemia: Secondary | ICD-10-CM | POA: Insufficient documentation

## 2019-01-20 DIAGNOSIS — Z7982 Long term (current) use of aspirin: Secondary | ICD-10-CM | POA: Insufficient documentation

## 2019-01-21 ENCOUNTER — Ambulatory Visit: Payer: Medicare HMO | Admitting: Oncology

## 2019-01-27 ENCOUNTER — Other Ambulatory Visit: Payer: Self-pay | Admitting: Nurse Practitioner

## 2019-01-29 ENCOUNTER — Other Ambulatory Visit: Payer: Self-pay | Admitting: Nurse Practitioner

## 2019-02-04 ENCOUNTER — Ambulatory Visit (INDEPENDENT_AMBULATORY_CARE_PROVIDER_SITE_OTHER): Payer: Medicare HMO

## 2019-02-04 VITALS — BP 128/89 | HR 84 | Wt 153.0 lb

## 2019-02-04 DIAGNOSIS — Z0000 Encounter for general adult medical examination without abnormal findings: Secondary | ICD-10-CM

## 2019-02-04 NOTE — Progress Notes (Signed)
Subjective:   Kara Leach is a 68 y.o. female who presents for Medicare Annual (Subsequent) preventive examination.  This visit is being conducted via phone call  - after an attempt to do on video chat - due to the COVID-19 pandemic. This patient has given me verbal consent via phone to conduct this visit, patient states they are participating from their home address. Some vital signs may be absent or patient reported.   Patient identification: identified by name, DOB, and current address.    Review of Systems:   Cardiac Risk Factors include: advanced age (>50men, >27 women);dyslipidemia;hypertension;family history of premature cardiovascular disease;obesity (BMI >30kg/m2)     Objective:     Vitals: BP 128/89   Pulse 84   Wt 153 lb (69.4 kg)   LMP  (LMP Unknown)   BMI 30.90 kg/m   Body mass index is 30.9 kg/m.  Advanced Directives 02/04/2019 01/19/2019 08/13/2018 01/15/2018 12/05/2017 01/15/2017 07/12/2016  Does Patient Have a Medical Advance Directive? Yes No;Yes Yes No Yes Yes No  Type of Advance Directive Living will;Healthcare Power of [place];Living will [place];Living will - [place];Living will Living will;Healthcare Power of Attorney -  Does patient want to make changes to medical advance directive? - No - Patient declined No - Patient declined - - - -  Copy of [place] in Chart? Yes - validated most recent copy scanned in chart (See row information) No - copy requested Yes - validated most recent copy scanned in chart (See row information) - No - copy requested - -  Would patient like information on creating a medical advance directive? - No - Patient declined - No - Patient declined - - No - Patient declined    Tobacco Social History   Tobacco Use  Smoking Status Never Smoker  Smokeless Tobacco Never Used     Counseling given: Not Answered   Clinical Intake:  Pre-visit  preparation completed: Yes  Pain : No/denies pain Pain Score: 0-No pain     Nutritional Status: BMI > 30  Obese Nutritional Risks: None Diabetes: No  How often do you need to have someone help you when you read instructions, pamphlets, or other written materials from your doctor or pharmacy?: 1 - Never  Nutrition Risk Assessment:  Has the patient had any N/V/D within the last 2 months?  No  Does the patient have any non-healing wounds?  No  Has the patient had any unintentional weight loss or weight gain?  No   Diabetes:  Is the patient diabetic?  Yes  If diabetic, was a CBG obtained today?  Yes , 130 at home Did the patient bring in their glucometer from home?  No  How often do you monitor your CBG's? BID   Financial Strains and Diabetes Management:  Are you having any financial strains with the device, your supplies or your medication? No .  Does the patient want to be seen by Chronic Care Management for management of their diabetes?  No  Would the patient like to be referred to a Nutritionist or for Diabetic Management?  No   Diabetic Exams:  Diabetic Eye Exam: Completed 05/08/2017. Overdue for diabetic eye exam. Scheduled for 03/2019 with [facility]   Diabetic Foot Exam:  Pt has been advised about the importance in completing this exam. Pt is scheduled for diabetic foot exam on 02/11/2019   Interpreter Needed?: No  Information entered by :: Davin Archuletta  Kamesha Herne,LPN  Past Medical History:  Diagnosis Date  . Allergy   . Anxiety   . Arthritis    Osteoarthritis  . Cancer ([place]) 05/2018   possibly on left breast but not sure  . Chronic kidney disease   . Depression   . Diabetes mellitus without complication ([place]) 0000000  . Diabetic neuropathy (HCC)    feet  . Diverticulitis 2017  . GERD (gastroesophageal reflux disease)   . Headache    sinus and migraines on occasion  . Hyperlipidemia   . Hypertension   . Last menstrual period (LMP) > 10 days ago 1986  . Obesity    . Peripheral vascular disease (HCC)    neuropathy in hands and feet d/t diabetes  . Renal insufficiency    Stage 2 kidney disease  . Seizures ([place])    due to high blood sugars. last time = 09/2014  . Sleep apnea    has CPAP, can't tolerate  . Stroke ([place]) 1980   minor. left side remains weaker than right  . Wears hearing aid    right side  . Wrist weakness    left - after fracture and repair   Past Surgical History:  Procedure Laterality Date  . ABDOMINAL HYSTERECTOMY  1986  . APPENDECTOMY  1964  . BREAST BIOPSY Left 07/03/2016   benign, fibrocystic changes with calcifications, usual ductal hyperplasia.   Marland Kitchen BREAST BIOPSY Left 04/23/2018   radial scar  . BREAST BIOPSY Left 08/13/2018   Procedure: WIDE LOCALIZATION AND OPEN BIOPSY OF LEFT BREAST, DIABETIC;  Surgeon: Robert Bellow, MD;  Location: [facility];  Service: General;  Laterality: Left;  . BREAST EXCISIONAL BIOPSY Left 08/13/2018  . CHOLECYSTECTOMY  2012  . COLONOSCOPY WITH PROPOFOL N/A 12/01/2015   Procedure: COLONOSCOPY WITH PROPOFOL;  Surgeon: [name], MD;  Location: [facility];  Service: Endoscopy;  Laterality: N/A;  Diabetic - insulin Sleep apnea  . PATELLA FRACTURE SURGERY Left    1. had to recenter kneecap, 2 and 3 surgeries were arthroscopies  . WRIST FRACTURE SURGERY Left 2005   Steel plate   Family History  Problem Relation Age of Onset  . Diabetes Mother   . Heart disease Mother   . Hypertension Mother   . Diabetes Sister   . Cancer Father        stomach  . Arthritis Sister   . Stroke Sister   . Hypertension Sister   . Breast cancer Maternal Aunt 98       great aunt   Social History   Socioeconomic History  . Marital status: Widowed    Spouse name: Not on file  . Number of children: Not on file  . Years of education: Not on file  . Highest education level: Not on file  Occupational History  . Occupation: worked at [employer] for [place] years    Comment: retired  Scientific laboratory technician  .  Financial resource strain: Not very hard  . Food insecurity    Worry: Never true    Inability: Never true  . Transportation needs    Medical: No    Non-medical: No  Tobacco Use  . Smoking status: Never Smoker  . Smokeless tobacco: Never Used  Substance and Sexual Activity  . Alcohol use: Not Currently    Alcohol/week: 0.0 standard drinks    Comment: occasionally holiday, weddings  . Drug use: No  . Sexual activity: Never  Lifestyle  . Physical activity    Days per week: 7  days    Minutes per session: 30 min  . Stress: Not at all  Relationships  . Social connections    Talks on phone: More than three times a week    Gets together: More than three times a week    Attends religious service: More than 4 times per year    Active member of club or organization: No    Attends meetings of clubs or organizations: Never    Relationship status: Widowed  Other Topics Concern  . Not on file  Social History Narrative  . Not on file    Outpatient Encounter Medications as of 02/04/2019  Medication Sig  . acetaminophen (TYLENOL ARTHRITIS PAIN) 650 MG CR tablet Take 650 mg by mouth every 8 (eight) hours as needed for pain.  Marland Kitchen allopurinol (ZYLOPRIM) 300 MG tablet Take 1 tablet (300 mg total) by mouth daily.  Marland Kitchen amLODipine (NORVASC) 5 MG tablet Take 1 tablet (5 mg total) by mouth daily.  Marland Kitchen aspirin EC 81 MG tablet Take 81 mg by mouth daily.  . BD PEN NEEDLE NANO U/F 32G X 4 MM MISC USE EVERY MORNING  . Calcium Carb-Cholecalciferol (CALCIUM 600 + D PO) Take 1 tablet by mouth 2 (two) times daily.  . enalapril (VASOTEC) 2.5 MG tablet TAKE 1 TABLET BY MOUTH EVERY DAY  . Famotidine (PEPCID PO) Take by mouth 2 (two) times daily.  . fluticasone (FLONASE) 50 MCG/ACT nasal spray PLACE 2 SPRAYS INTO BOTH NOSTRILS DAILY AS NEEDED FOR ALLERGIES.  Marland Kitchen insulin aspart (NOVOLOG FLEXPEN) 100 UNIT/ML FlexPen INJECT 15U UNDER THE SKIN 3 TIMES DAILY WITH MEALS  . LEVEMIR FLEXTOUCH 100 UNIT/ML Pen INJECT 60 UNITS  INTO THE SKIN DAILY AT 10 PM.  . liraglutide (VICTOZA) 18 MG/3ML SOPN ADMINISTER 1.2MG  UNDER THE SKIN DAILY  . metoprolol succinate (TOPROL-XL) 25 MG 24 hr tablet Take 1 tablet (25 mg total) by mouth daily.  Glory Rosebush ULTRA test strip USE TO CHECK BLOOD SUGAR DAILY  . sertraline (ZOLOFT) 100 MG tablet TAKE 1 TABLET (100 MG TOTAL) EVERY MORNING BY MOUTH.  . simvastatin (ZOCOR) 40 MG tablet TAKE 1 TABLET BY MOUTH EVERYDAY AT BEDTIME  . Specialty Vitamins Products (VITA-RX DIABETIC VITAMIN) CAPS Take 1 packet by mouth daily. Diabetic Vitamins - 6 or 7 in 1 pack   No facility-administered encounter medications on file as of 02/04/2019.     Activities of Daily Living In your present state of health, do you have any difficulty performing the following activities: 02/04/2019 08/07/2018  Hearing? Y -  Comment hearing aids- bilateral -  Vision? N -  Comment eyeglasses, goes to [facility] -  Difficulty concentrating or making decisions? N -  Walking or climbing stairs? Y Y  Comment - -  Dressing or bathing? N Y  Doing errands, shopping? N -  Preparing Food and eating ? N -  Using the Toilet? N -  In the past six months, have you accidently leaked urine? N -  Do you have problems with loss of bowel control? N -  Managing your Medications? N -  Managing your Finances? N -  Housekeeping or managing your Housekeeping? N -  Some recent data might be hidden    Patient Care Team: Venita Lick, NP as PCP - General (Nurse Practitioner) Bary Castilla, Forest Gleason, MD (General Surgery) Anthonette Legato, MD (Internal Medicine) Leanor Kail, MD (Inactive) (Orthopedic Surgery) Lloyd Huger, MD as Consulting Physician (Oncology) De Hollingshead, [facility] as Pharmacist (Pharmacist)  Assessment:   This is a routine wellness examination for [place].  Exercise Activities and Dietary recommendations Current Exercise Habits: Home exercise routine, Type of exercise: strength  training/weights, Time (Minutes): 30, Frequency (Times/Week): 7, Weekly Exercise (Minutes/Week): 210, Intensity: Mild, Exercise limited by: None identified  Goals    . DIET - INCREASE WATER INTAKE     Recommend continue drinking at least 6-8 glasses of water a day        Fall Risk: Fall Risk  02/04/2019 10/02/2018 08/28/2018 07/31/2018 05/20/2018  Falls in the past year? 0 0 0 0 0  Comment - - - - -  Number falls in past yr: 0 0 - 0 -  Injury with Fall? 0 0 - 0 -  Comment - - - - -  Risk Factor Category  - - - - -  Risk for fall due to : - - - - -  Follow up - - - - -  Comment - - - - -    FALL RISK PREVENTION PERTAINING TO THE HOME:  Any stairs in or around the home? No  If so, are there any without handrails? No   Home free of loose throw rugs in walkways, pet beds, electrical cords, etc? Yes  Adequate lighting in your home to reduce risk of falls? Yes   ASSISTIVE DEVICES UTILIZED TO PREVENT FALLS:  Life alert? Yes  Use of a cane, walker or w/c? Yes  cane  Grab bars in the bathroom? Yes  Shower chair or bench in shower? No  Elevated toilet seat or a handicapped toilet? No   TIMED UP AND GO:  Unable to perform    Depression Screen PHQ 2/9 Scores 02/04/2019 11/07/2018 08/06/2018 12/05/2017  PHQ - 2 Score 0 2 2 4   PHQ- 9 Score - 8 5 10      Cognitive Function     6CIT Screen 12/05/2017  What Year? 0 points  What month? 0 points  What time? 0 points  Count back from 20 0 points  Months in reverse 0 points  Repeat phrase 0 points  Total Score 0    Immunization History  Administered Date(s) Administered  . Influenza,inj,Quad PF,6+ Mos 11/01/2016, 11/13/2017  . Influenza-Unspecified 12/20/2014, 12/28/2015  . Pneumococcal Conjugate-13 02/12/2018  . Pneumococcal Polysaccharide-23 02/14/2009  . Td 06/24/2008  . Tdap 01/08/2011  . Zoster 12/28/2015  . Zoster Recombinat (Shingrix) 10/13/2018    Qualifies for Shingles Vaccine? Yes  has first dose of shingrix    Tdap: up to date   Flu Vaccine: Due for Flu vaccine. Will get when she comes in for cpe   Pneumococcal Vaccine: up to date   Screening Tests Health Maintenance  Topic Date Due  . OPHTHALMOLOGY EXAM  05/08/2018  . FOOT EXAM  05/09/2018  . INFLUENZA VACCINE  10/18/2018  . PNA vac Low Risk Adult (2 of 2 - PPSV23) 02/13/2019  . HEMOGLOBIN A1C  05/10/2019  . MAMMOGRAM  03/27/2020  . TETANUS/TDAP  01/07/2021  . COLONOSCOPY  11/30/2025  . DEXA SCAN  Completed  . Hepatitis C Screening  Completed    Cancer Screenings:  Colorectal Screening: Completed 2017. Repeat every 10 years  Mammogram: Completed 03/27/2018. Repeat every year;  Bone Density: Completed 10/09/2018.  Lung Cancer Screening: (Low Dose CT Chest recommended if Age 23-80 years, 30 pack-year currently smoking OR have quit w/in 15years.) does not qualify.     Additional Screening:  Hepatitis C Screening: does qualify; Completed 08/12/2015  Dental Screening: Recommended  annual dental exams for proper oral hygiene   Community Resource Referral:  CRR required this visit?  No       Plan:  I have personally reviewed and addressed the Medicare Annual Wellness questionnaire and have noted the following in the patient's chart:  A. Medical and social history B. Use of alcohol, tobacco or illicit drugs  C. Current medications and supplements D. Functional ability and status E.  Nutritional status F.  Physical activity G. Advance directives H. List of other physicians I.  Hospitalizations, surgeries, and ER visits in previous 12 months J.  [place] such as hearing and vision if needed, cognitive and depression L. Referrals and appointments   In addition, I have reviewed and discussed with patient certain preventive protocols, quality metrics, and best practice recommendations. A written personalized care plan for preventive services as well as general preventive health recommendations were provided to  patient.   Signed,    Bevelyn Ngo, LPN  [id number] Nurse Health Advisor   Nurse Notes: none

## 2019-02-04 NOTE — Patient Instructions (Signed)
Kara Leach , Thank you for taking time to come for your Medicare Wellness Visit. I appreciate your ongoing commitment to your health goals. Please review the following plan we discussed and let me know if I can assist you in the future.   Screening recommendations/referrals: Colonoscopy: up to date Mammogram: up to date Bone Density: up to date Recommended yearly ophthalmology/optometry visit for glaucoma screening and checkup Recommended yearly dental visit for hygiene and checkup  Vaccinations: Influenza vaccine: due now  Pneumococcal vaccine: up to date Tdap vaccine: up to date Shingles vaccine: up to date    Advanced directives: copy on file   Conditions/risks identified: diabetic, discussed chronic care management team   Next appointment: Follow up in one year for your annual wellness visit    Preventive Care 32 Years and Older, Female Preventive care refers to lifestyle choices and visits with your health care provider that can promote health and wellness. What does preventive care include?  A yearly physical exam. This is also called an annual well check.  Dental exams once or twice a year.  Routine eye exams. Ask your health care provider how often you should have your eyes checked.  Personal lifestyle choices, including:  Daily care of your teeth and gums.  Regular physical activity.  Eating a healthy diet.  Avoiding tobacco and drug use.  Limiting alcohol use.  Practicing safe sex.  Taking low-dose aspirin every day.  Taking vitamin and mineral supplements as recommended by your health care provider. What happens during an annual well check? The services and screenings done by your health care provider during your annual well check will depend on your age, overall health, lifestyle risk factors, and family history of disease. Counseling  Your health care provider may ask you questions about your:  Alcohol use.  Tobacco use.  Drug use.  Emotional  well-being.  Home and relationship well-being.  Sexual activity.  Eating habits.  History of falls.  Memory and ability to understand (cognition).  Work and work Statistician.  Reproductive health. Screening  You may have the following tests or measurements:  Height, weight, and BMI.  Blood pressure.  Lipid and cholesterol levels. These may be checked every 5 years, or more frequently if you are over 58 years old.  Skin check.  Lung cancer screening. You may have this screening every year starting at age 66 if you have a 30-pack-year history of smoking and currently smoke or have quit within the past 15 years.  Fecal occult blood test (FOBT) of the stool. You may have this test every year starting at age 66.  Flexible sigmoidoscopy or colonoscopy. You may have a sigmoidoscopy every 5 years or a colonoscopy every 10 years starting at age 66.  Hepatitis C blood test.  Hepatitis B blood test.  Sexually transmitted disease (STD) testing.  Diabetes screening. This is done by checking your blood sugar (glucose) after you have not eaten for a while (fasting). You may have this done every 1-3 years.  Bone density scan. This is done to screen for osteoporosis. You may have this done starting at age 66.  Mammogram. This may be done every 1-2 years. Talk to your health care provider about how often you should have regular mammograms. Talk with your health care provider about your test results, treatment options, and if necessary, the need for more tests. Vaccines  Your health care provider may recommend certain vaccines, such as:  Influenza vaccine. This is recommended every year.  Tetanus,  diphtheria, and acellular pertussis (Tdap, Td) vaccine. You may need a Td booster every 10 years.  Zoster vaccine. You may need this after age 48.  Pneumococcal 13-valent conjugate (PCV13) vaccine. One dose is recommended after age 55.  Pneumococcal polysaccharide (PPSV23) vaccine. One  dose is recommended after age 59. Talk to your health care provider about which screenings and vaccines you need and how often you need them. This information is not intended to replace advice given to you by your health care provider. Make sure you discuss any questions you have with your health care provider. Document Released: 04/01/2015 Document Revised: 11/23/2015 Document Reviewed: 01/04/2015 Elsevier Interactive Patient Education  2017 [employer] Prevention in the Home Falls can cause injuries. They can happen to people of all ages. There are many things you can do to make your home safe and to help prevent falls. What can I do on the outside of my home?  Regularly fix the edges of walkways and driveways and fix any cracks.  Remove anything that might make you trip as you walk through a door, such as a raised step or threshold.  Trim any bushes or trees on the path to your home.  Use bright outdoor lighting.  Clear any walking paths of anything that might make someone trip, such as rocks or tools.  Regularly check to see if handrails are loose or broken. Make sure that both sides of any steps have handrails.  Any raised decks and porches should have guardrails on the edges.  Have any leaves, snow, or ice cleared regularly.  Use sand or salt on walking paths during winter.  Clean up any spills in your garage right away. This includes oil or grease spills. What can I do in the bathroom?  Use night lights.  Install grab bars by the toilet and in the tub and shower. Do not use towel bars as grab bars.  Use non-skid mats or decals in the tub or shower.  If you need to sit down in the shower, use a plastic, non-slip stool.  Keep the floor dry. Clean up any water that spills on the floor as soon as it happens.  Remove soap buildup in the tub or shower regularly.  Attach bath mats securely with double-sided non-slip rug tape.  Do not have throw rugs and other  things on the floor that can make you trip. What can I do in the bedroom?  Use night lights.  Make sure that you have a light by your bed that is easy to reach.  Do not use any sheets or blankets that are too big for your bed. They should not hang down onto the floor.  Have a firm chair that has side arms. You can use this for support while you get dressed.  Do not have throw rugs and other things on the floor that can make you trip. What can I do in the kitchen?  Clean up any spills right away.  Avoid walking on wet floors.  Keep items that you use a lot in easy-to-reach places.  If you need to reach something above you, use a strong step stool that has a grab bar.  Keep electrical cords out of the way.  Do not use floor polish or wax that makes floors slippery. If you must use wax, use non-skid floor wax.  Do not have throw rugs and other things on the floor that can make you trip. What can I do with  my stairs?  Do not leave any items on the stairs.  Make sure that there are handrails on both sides of the stairs and use them. Fix handrails that are broken or loose. Make sure that handrails are as long as the stairways.  Check any carpeting to make sure that it is firmly attached to the stairs. Fix any carpet that is loose or worn.  Avoid having throw rugs at the top or bottom of the stairs. If you do have throw rugs, attach them to the floor with carpet tape.  Make sure that you have a light switch at the top of the stairs and the bottom of the stairs. If you do not have them, ask someone to add them for you. What else can I do to help prevent falls?  Wear shoes that:  Do not have high heels.  Have rubber bottoms.  Are comfortable and fit you well.  Are closed at the toe. Do not wear sandals.  If you use a stepladder:  Make sure that it is fully opened. Do not climb a closed stepladder.  Make sure that both sides of the stepladder are locked into place.  Ask  someone to hold it for you, if possible.  Clearly mark and make sure that you can see:  Any grab bars or handrails.  First and last steps.  Where the edge of each step is.  Use tools that help you move around (mobility aids) if they are needed. These include:  Canes.  Walkers.  Scooters.  Crutches.  Turn on the lights when you go into a dark area. Replace any light bulbs as soon as they burn out.  Set up your furniture so you have a clear path. Avoid moving your furniture around.  If any of your floors are uneven, fix them.  If there are any pets around you, be aware of where they are.  Review your medicines with your doctor. Some medicines can make you feel dizzy. This can increase your chance of falling. Ask your doctor what other things that you can do to help prevent falls. This information is not intended to replace advice given to you by your health care provider. Make sure you discuss any questions you have with your health care provider. Document Released: 12/30/2008 Document Revised: 08/11/2015 Document Reviewed: 04/09/2014 Elsevier Interactive Patient Education  2017 [employer] 

## 2019-02-11 ENCOUNTER — Encounter: Payer: Self-pay | Admitting: Nurse Practitioner

## 2019-02-11 ENCOUNTER — Ambulatory Visit (INDEPENDENT_AMBULATORY_CARE_PROVIDER_SITE_OTHER): Payer: Medicare HMO | Admitting: Nurse Practitioner

## 2019-02-11 ENCOUNTER — Other Ambulatory Visit: Payer: Self-pay

## 2019-02-11 VITALS — BP 101/69 | HR 89 | Temp 98.2°F | Ht 59.0 in | Wt 156.0 lb

## 2019-02-11 DIAGNOSIS — R69 Illness, unspecified: Secondary | ICD-10-CM | POA: Diagnosis not present

## 2019-02-11 DIAGNOSIS — D472 Monoclonal gammopathy: Secondary | ICD-10-CM | POA: Diagnosis not present

## 2019-02-11 DIAGNOSIS — Z23 Encounter for immunization: Secondary | ICD-10-CM

## 2019-02-11 DIAGNOSIS — M1A39X0 Chronic gout due to renal impairment, multiple sites, without tophus (tophi): Secondary | ICD-10-CM

## 2019-02-11 DIAGNOSIS — N182 Chronic kidney disease, stage 2 (mild): Secondary | ICD-10-CM

## 2019-02-11 DIAGNOSIS — E1122 Type 2 diabetes mellitus with diabetic chronic kidney disease: Secondary | ICD-10-CM

## 2019-02-11 DIAGNOSIS — E538 Deficiency of other specified B group vitamins: Secondary | ICD-10-CM | POA: Diagnosis not present

## 2019-02-11 DIAGNOSIS — E785 Hyperlipidemia, unspecified: Secondary | ICD-10-CM | POA: Diagnosis not present

## 2019-02-11 DIAGNOSIS — I1310 Hypertensive heart and chronic kidney disease without heart failure, with stage 1 through stage 4 chronic kidney disease, or unspecified chronic kidney disease: Secondary | ICD-10-CM | POA: Diagnosis not present

## 2019-02-11 DIAGNOSIS — Z0000 Encounter for general adult medical examination without abnormal findings: Secondary | ICD-10-CM

## 2019-02-11 DIAGNOSIS — F324 Major depressive disorder, single episode, in partial remission: Secondary | ICD-10-CM

## 2019-02-11 DIAGNOSIS — E559 Vitamin D deficiency, unspecified: Secondary | ICD-10-CM

## 2019-02-11 DIAGNOSIS — N1831 Chronic kidney disease, stage 3a: Secondary | ICD-10-CM | POA: Diagnosis not present

## 2019-02-11 DIAGNOSIS — Z794 Long term (current) use of insulin: Secondary | ICD-10-CM

## 2019-02-11 DIAGNOSIS — M8589 Other specified disorders of bone density and structure, multiple sites: Secondary | ICD-10-CM

## 2019-02-11 DIAGNOSIS — E1169 Type 2 diabetes mellitus with other specified complication: Secondary | ICD-10-CM

## 2019-02-11 LAB — BAYER DCA HB A1C WAIVED: HB A1C (BAYER DCA - WAIVED): 6.4 % (ref <7.0)

## 2019-02-11 MED ORDER — ALLOPURINOL 100 MG PO TABS: 100.0000 mg | ORAL_TABLET | Freq: Every day | ORAL | 2 refills | Status: DC

## 2019-02-11 MED ORDER — LEVEMIR FLEXTOUCH 100 UNIT/ML ~~LOC~~ SOPN: PEN_INJECTOR | SUBCUTANEOUS | 3 refills | Status: DC

## 2019-02-11 NOTE — Assessment & Plan Note (Signed)
Chronic, ongoing.  Continue current medication regimen and adjust as needed.  Enalapril for kidney protection.  CMP today.  Renal dosing of Allopurinol done.

## 2019-02-11 NOTE — Patient Instructions (Signed)
Carbohydrate Counting for Diabetes Mellitus, Adult  Carbohydrate counting is a method of keeping track of how many carbohydrates you eat. Eating carbohydrates naturally increases the amount of sugar (glucose) in the blood. Counting how many carbohydrates you eat helps keep your blood glucose within normal limits, which helps you manage your diabetes (diabetes mellitus). It is important to know how many carbohydrates you can safely have in each meal. This is different for every person. A diet and nutrition specialist (registered dietitian) can help you make a meal plan and calculate how many carbohydrates you should have at each meal and snack. Carbohydrates are found in the following foods:  Grains, such as breads and cereals.  Dried beans and soy products.  Starchy vegetables, such as potatoes, peas, and corn.  Fruit and fruit juices.  Milk and yogurt.  Sweets and snack foods, such as cake, cookies, candy, chips, and soft drinks. How do I count carbohydrates? There are two ways to count carbohydrates in food. You can use either of the methods or a combination of both. Reading "Nutrition Facts" on packaged food The "Nutrition Facts" list is included on the labels of almost all packaged foods and beverages in the [place] It includes:  The serving size.  Information about nutrients in each serving, including the grams (g) of carbohydrate per serving. To use the "Nutrition Facts":  Decide how many servings you will have.  Multiply the number of servings by the number of carbohydrates per serving.  The resulting number is the total amount of carbohydrates that you will be having. Learning standard serving sizes of other foods When you eat carbohydrate foods that are not packaged or do not include "Nutrition Facts" on the label, you need to measure the servings in order to count the amount of carbohydrates:  Measure the foods that you will eat with a food scale or measuring cup, if needed.   Decide how many standard-size servings you will eat.  Multiply the number of servings by 15. Most carbohydrate-rich foods have about 15 g of carbohydrates per serving. ? For example, if you eat 8 oz (170 g) of strawberries, you will have eaten 2 servings and 30 g of carbohydrates (2 servings x 15 g = 30 g).  For foods that have more than one food mixed, such as soups and casseroles, you must count the carbohydrates in each food that is included. The following list contains standard serving sizes of common carbohydrate-rich foods. Each of these servings has about 15 g of carbohydrates:   hamburger bun or  English muffin.   oz (15 mL) syrup.   oz (14 g) jelly.  1 slice of bread.  1 six-inch tortilla.  3 oz (85 g) cooked rice or pasta.  4 oz (113 g) cooked dried beans.  4 oz (113 g) starchy vegetable, such as peas, corn, or potatoes.  4 oz (113 g) hot cereal.  4 oz (113 g) mashed potatoes or  of a large baked potato.  4 oz (113 g) canned or frozen fruit.  4 oz (120 mL) fruit juice.  4-6 crackers.  6 chicken nuggets.  6 oz (170 g) unsweetened dry cereal.  6 oz (170 g) plain fat-free yogurt or yogurt sweetened with artificial sweeteners.  8 oz (240 mL) milk.  8 oz (170 g) fresh fruit or one small piece of fruit.  24 oz (680 g) popped popcorn. Example of carbohydrate counting Sample meal  3 oz (85 g) chicken breast.  6 oz (170 g)   brown rice.  4 oz (113 g) corn.  8 oz (240 mL) milk.  8 oz (170 g) strawberries with sugar-free whipped topping. Carbohydrate calculation 1. Identify the foods that contain carbohydrates: ? Rice. ? Corn. ? Milk. ? Strawberries. 2. Calculate how many servings you have of each food: ? 2 servings rice. ? 1 serving corn. ? 1 serving milk. ? 1 serving strawberries. 3. Multiply each number of servings by 15 g: ? 2 servings rice x 15 g = 30 g. ? 1 serving corn x 15 g = 15 g. ? 1 serving milk x 15 g = 15 g. ? 1 serving  strawberries x 15 g = 15 g. 4. Add together all of the amounts to find the total grams of carbohydrates eaten: ? 30 g + 15 g + 15 g + 15 g = 75 g of carbohydrates total. Summary  Carbohydrate counting is a method of keeping track of how many carbohydrates you eat.  Eating carbohydrates naturally increases the amount of sugar (glucose) in the blood.  Counting how many carbohydrates you eat helps keep your blood glucose within normal limits, which helps you manage your diabetes.  A diet and nutrition specialist (registered dietitian) can help you make a meal plan and calculate how many carbohydrates you should have at each meal and snack. This information is not intended to replace advice given to you by your health care provider. Make sure you discuss any questions you have with your health care provider. Document Released: 03/05/2005 Document Revised: 09/27/2016 Document Reviewed: 08/17/2015 Elsevier Patient Education  2020 [employer]  

## 2019-02-11 NOTE — Assessment & Plan Note (Signed)
Chronic, stable.  PHQ = 11 and denies SI/HI.  Continue current medication regimen, Sertraline 100 MG daily.  She does not want to increase dose at this time.

## 2019-02-11 NOTE — Assessment & Plan Note (Signed)
Continue Vitamin D and calcium daily.  Check Vit D level today.

## 2019-02-11 NOTE — Assessment & Plan Note (Signed)
Chronic, ongoing.  Will dose reduce Allopurinol due to GFR 52 on recent labs and CKD present.  Script for Allopurinol 100 MG sent.  Discussed at length with patient.  Check uric acid level today.

## 2019-02-11 NOTE — Assessment & Plan Note (Signed)
Continue collaboration with Dr. Grayland Ormond at [facility].

## 2019-02-11 NOTE — Assessment & Plan Note (Signed)
Check Vitamin D level and adjust as needed.

## 2019-02-11 NOTE — Progress Notes (Signed)
BP 101/69   Pulse 89   Temp 98.2 F (36.8 C) (Oral)   Ht 4\' 11"  (1.499 m)   Wt 156 lb (70.8 kg)   LMP  (LMP Unknown)   SpO2 98%   BMI 31.51 kg/m    Subjective:    Patient ID: Kara Leach, female    DOB: [birth year], 68 y.o.   MRN: [id number]  HPI: Kara Leach is a 68 y.o. female presenting on 02/11/2019 for comprehensive medical examination. Current medical complaints include:none  She currently lives with: Menopausal Symptoms: no   DIABETES Last A1C 6.8% in August.  Continues on Novolog pre meal and Levemir + Victoza 1.2 MG daily. Endorses she has not followed diet as well and has not been walking as much due to Covid 19 pandemic.  Hypoglycemic episodes:no Polydipsia/polyuria: no Visual disturbance: no Chest pain: no Paresthesias: no Glucose Monitoring: yes             Accucheck frequency: BID             Fasting glucose: 123 this morning (averaging 110-120)             Post prandial:             Evening: < 160 at night             Before meals: Taking Insulin?: yes             Long acting insulin: Levemir 60 units             Short acting insulin: Novolog 16 units Blood Pressure Monitoring: daily Retinal Examination: Not up to Date, going in January Foot Exam: Up to Date Pneumovax: Up to Date Influenza: Up to Date Aspirin: yes   HYPERTENSION / HYPERLIPIDEMIA Continues on Amlodipine, Enalapril, Metoprolol, and ASA + Simvastatin. Satisfied with current treatment? yes Duration of hypertension: chronic BP monitoring frequency: daily BP range: 120/80's on average BP medication side effects: no Duration of hyperlipidemia: chronic Cholesterol medication side effects: no Cholesterol supplements: none Medication compliance: good compliance Aspirin: yes Recent stressors: no Recurrent headaches: no Visual changes: no Palpitations: no Dyspnea: no Chest pain: no Lower extremity edema: no Dizzy/lightheaded: no The 10-year ASCVD risk score [name],  et al., 2013) is: 9%   Values used to calculate the score:     Age: 79 years     Sex: Female     Is Non-Hispanic African American: No     Diabetic: Yes     Tobacco smoker: No     Systolic Blood Pressure: 99991111 mmHg     Is BP treated: Yes     HDL Cholesterol: 49 mg/dL     Total Cholesterol: 152 mg/dL   CHRONIC KIDNEY DISEASE With GFR ranging from 48-56 over past two years.  Recent GFR based on labs 31.36.  Recent GFR 52 in October 2020. CKD status: stable Medications renally dose: yes Previous renal evaluation: no Pneumovax:  Up to Date Influenza Vaccine:  Up to Date  OSTEOPENIA: Continues on daily calcium and Vitamin D supplements.  Noted on DEXA in July 2020.  Was taking Tylenol frequently for arthritis pain, but now taking one in morning and one before bedtime due to recent elevation in AST/ALT 70/42 on August.  MGUS: Followed by Dr. Grayland [facility]rm[facility] at [facility] and last seen 01/20/2019.  On review note recommends return in one year and continue to monitor labs yearly.    GOUT Continues on Allopurinol 300 MG daily.  Has not had gout flare in several years. Duration:chronic Swelling: no Redness: no Trauma: no Recent dietary change or indiscretion: no Fevers: no Nausea/vomiting: no Status:  stable Treatments attempted: Allopurinol  DEPRESSION Continues on Sertraline 100 MG.  She is satisfied with current regimen, reports increase symptoms this time of year.  Denies SI/HI. Mood status: stable Satisfied with current treatment?: yes Symptom severity: moderate  Duration of current treatment : chronic Side effects: no Medication compliance: good compliance Psychotherapy/counseling: none Previous psychiatric medications: Zoloft Depressed mood: yes Anxious mood: yes Anhedonia: no Significant weight loss or gain: no Insomnia: none Fatigue: no Feelings of worthlessness or guilt: no Impaired concentration/indecisiveness: yes Suicidal ideations: no Hopelessness: no Crying  spells: no Depression screen [facility] 2/9 02/11/2019 02/04/2019 11/07/2018 08/06/2018 12/05/2017  Decreased Interest 1 0 1 1 2   Down, Depressed, Hopeless 1 0 1 1 2   PHQ - 2 Score 2 0 2 2 4   Altered sleeping 3 - 3 2 2   Tired, decreased energy 3 - 3 1 2   Change in appetite 1 - 0 0 0  Feeling bad or failure about yourself  2 - 0 0 2  Trouble concentrating 0 - 0 0 0  Moving slowly or fidgety/restless 0 - 0 0 0  Suicidal thoughts 0 - 0 0 0  PHQ-9 Score 11 - 8 5 10   Difficult doing work/chores - - Not difficult at all - Not difficult at all  Some recent data might be hidden    The patient does not have a history of falls. I did not complete a risk assessment for falls. A plan of care for falls was not documented.   Past Medical History:  Past Medical History:  Diagnosis Date  . Allergy   . Anxiety   . Arthritis    Osteoarthritis  . Cancer ([place]) 05/2018   possibly on left breast but not sure  . Chronic kidney disease   . Depression   . Diabetes mellitus without complication ([place]) 0000000  . Diabetic neuropathy (HCC)    feet  . Diverticulitis 2017  . GERD (gastroesophageal reflux disease)   . Headache    sinus and migraines on occasion  . Hyperlipidemia   . Hypertension   . Last menstrual period (LMP) > 10 days ago 1986  . Obesity   . Peripheral vascular disease (HCC)    neuropathy in hands and feet d/t diabetes  . Renal insufficiency    Stage 2 kidney disease  . Seizures ([place])    due to high blood sugars. last time = 09/2014  . Sleep apnea    has CPAP, can't tolerate  . Stroke ([place]) 1980   minor. left side remains weaker than right  . Wears hearing aid    right side  . Wrist weakness    left - after fracture and repair    Surgical History:  Past Surgical History:  Procedure Laterality Date  . ABDOMINAL HYSTERECTOMY  1986  . APPENDECTOMY  1964  . BREAST BIOPSY Left 07/03/2016   benign, fibrocystic changes with calcifications, usual ductal hyperplasia.   Marland Kitchen BREAST BIOPSY  Left 04/23/2018   radial scar  . BREAST BIOPSY Left 08/13/2018   Procedure: WIDE LOCALIZATION AND OPEN BIOPSY OF LEFT BREAST, DIABETIC;  Surgeon: Robert Bellow, MD;  Location: [facility] ORS;  Service: General;  Laterality: Left;  . BREAST EXCISIONAL BIOPSY Left 08/13/2018  . CHOLECYSTECTOMY  2012  . COLONOSCOPY WITH PROPOFOL N/A 12/01/2015   Procedure: COLONOSCOPY WITH PROPOFOL;  Surgeon:  [name], MD;  Location: [place];  Service: Endoscopy;  Laterality: N/A;  Diabetic - insulin Sleep apnea  . PATELLA FRACTURE SURGERY Left    1. had to recenter kneecap, 2 and 3 surgeries were arthroscopies  . WRIST FRACTURE SURGERY Left 2005   Steel plate    Medications:  Current Outpatient Medications on File Prior to Visit  Medication Sig  . acetaminophen (TYLENOL ARTHRITIS PAIN) 650 MG CR tablet Take 650 mg by mouth every 8 (eight) hours as needed for pain.  Marland Kitchen amLODipine (NORVASC) 5 MG tablet Take 1 tablet (5 mg total) by mouth daily.  Marland Kitchen aspirin EC 81 MG tablet Take 81 mg by mouth daily.  . BD PEN NEEDLE NANO U/F 32G X 4 MM MISC USE EVERY MORNING  . Calcium Carb-Cholecalciferol (CALCIUM 600 + D PO) Take 1 tablet by mouth 2 (two) times daily.  . enalapril (VASOTEC) 2.5 MG tablet TAKE 1 TABLET BY MOUTH EVERY DAY  . Famotidine (PEPCID PO) Take by mouth 2 (two) times daily.  . fluticasone (FLONASE) 50 MCG/ACT nasal spray PLACE 2 SPRAYS INTO BOTH NOSTRILS DAILY AS NEEDED FOR ALLERGIES.  Marland Kitchen insulin aspart (NOVOLOG FLEXPEN) 100 UNIT/ML FlexPen INJECT 15U UNDER THE SKIN 3 TIMES DAILY WITH MEALS  . liraglutide (VICTOZA) 18 MG/3ML SOPN ADMINISTER 1.2MG  UNDER THE SKIN DAILY  . metoprolol succinate (TOPROL-XL) 25 MG 24 hr tablet Take 1 tablet (25 mg total) by mouth daily.  Glory Rosebush ULTRA test strip USE TO CHECK BLOOD SUGAR DAILY  . sertraline (ZOLOFT) 100 MG tablet TAKE 1 TABLET (100 MG TOTAL) EVERY MORNING BY MOUTH.  . simvastatin (ZOCOR) 40 MG tablet TAKE 1 TABLET BY MOUTH EVERYDAY AT BEDTIME   . Specialty Vitamins Products (VITA-RX DIABETIC VITAMIN) CAPS Take 1 packet by mouth daily. Diabetic Vitamins - 6 or 7 in 1 pack   No current facility-administered medications on file prior to visit.     Allergies:  Allergies  Allergen Reactions  . Strawberry Extract Swelling    Lips swell and rash/whelps over body Difficulty breathing  . Ibuprofen Other (See Comments)    "ringing in ears", dizziness Also, Advil / Aleve / Motrin    Social History:  Social History   Socioeconomic History  . Marital status: Widowed    Spouse name: Not on file  . Number of children: Not on file  . Years of education: Not on file  . Highest education level: Not on file  Occupational History  . Occupation: worked at [employer] for [employer] years    Comment: retired  Scientific laboratory technician  . Financial resource strain: Not very hard  . Food insecurity    Worry: Never true    Inability: Never true  . Transportation needs    Medical: No    Non-medical: No  Tobacco Use  . Smoking status: Never Smoker  . Smokeless tobacco: Never Used  Substance and Sexual Activity  . Alcohol use: Not Currently    Alcohol/week: 0.0 standard drinks    Comment: occasionally holiday, weddings  . Drug use: No  . Sexual activity: Never  Lifestyle  . Physical activity    Days per week: 7 days    Minutes per session: 30 min  . Stress: Not at all  Relationships  . Social connections    Talks on phone: More than three times a week    Gets together: More than three times a week    Attends religious service: More than 4 times per year  Active member of club or organization: No    Attends meetings of clubs or organizations: Never    Relationship status: Widowed  . Intimate partner violence    Fear of current or ex partner: No    Emotionally abused: No    Physically abused: No    Forced sexual activity: No  Other Topics Concern  . Not on file  Social History Narrative  . Not on file   Social History   Tobacco Use   Smoking Status Never Smoker  Smokeless Tobacco Never Used   Social History   Substance and Sexual Activity  Alcohol Use Not Currently  . Alcohol/week: 0.0 standard drinks   Comment: occasionally holiday, weddings    Family History:  Family History  Problem Relation Age of Onset  . Diabetes Mother   . Heart disease Mother   . Hypertension Mother   . Diabetes Sister   . Cancer Father        stomach  . Arthritis Sister   . Stroke Sister   . Hypertension Sister   . Breast cancer Maternal Aunt 30       great aunt    Past medical history, surgical history, medications, allergies, family history and social history reviewed with patient today and changes made to appropriate areas of the chart.   Review of Systems - negative All other ROS negative except what is listed above and in the HPI.      Objective:    BP 101/69   Pulse 89   Temp 98.2 F (36.8 C) (Oral)   Ht 4\' 11"  (1.499 m)   Wt 156 lb (70.8 kg)   LMP  (LMP Unknown)   SpO2 98%   BMI 31.51 kg/m   Wt Readings from Last 3 Encounters:  02/11/19 156 lb (70.8 kg)  02/04/19 153 lb (69.4 kg)  01/19/19 158 lb (71.7 kg)    Physical Exam Constitutional:      General: She is awake. She is not in acute distress.    Appearance: She is well-developed. She is not ill-appearing.  HENT:     Head: Normocephalic and atraumatic.     Right Ear: Hearing, tympanic membrane, ear canal and external ear normal. No drainage.     Left Ear: Hearing, tympanic membrane, ear canal and external ear normal. No drainage.     Nose: Nose normal.     Right Sinus: No maxillary sinus tenderness or frontal sinus tenderness.     Left Sinus: No maxillary sinus tenderness or frontal sinus tenderness.     Mouth/Throat:     Mouth: Mucous membranes are moist.     Pharynx: Oropharynx is clear. Uvula midline. No pharyngeal swelling, oropharyngeal exudate or posterior oropharyngeal erythema.  Eyes:     General: Lids are normal.        Right eye: No  discharge.        Left eye: No discharge.     Extraocular Movements: Extraocular movements intact.     Conjunctiva/sclera: Conjunctivae normal.     Pupils: Pupils are equal, round, and reactive to light.     Visual Fields: Right eye visual fields normal and left eye visual fields normal.  Neck:     Musculoskeletal: Normal range of motion and neck supple.     Thyroid: No thyromegaly.     Vascular: No carotid bruit.     Trachea: Trachea normal.  Cardiovascular:     Rate and Rhythm: Normal rate and regular rhythm.  Heart sounds: Normal heart sounds. No murmur. No gallop.   Pulmonary:     Effort: Pulmonary effort is normal. No accessory muscle usage or respiratory distress.     Breath sounds: Normal breath sounds.  Chest:     Breasts:        Right: Normal.        Left: Normal.  Abdominal:     General: Bowel sounds are normal.     Palpations: Abdomen is soft. There is no hepatomegaly or splenomegaly.     Tenderness: There is no abdominal tenderness.  Musculoskeletal: Normal range of motion.     Right lower leg: No edema.     Left lower leg: No edema.  Lymphadenopathy:     Head:     Right side of head: No submental, submandibular, tonsillar, preauricular or posterior auricular adenopathy.     Left side of head: No submental, submandibular, tonsillar, preauricular or posterior auricular adenopathy.     Cervical: No cervical adenopathy.     Upper Body:     Right upper body: No supraclavicular, axillary or pectoral adenopathy.     Left upper body: No supraclavicular, axillary or pectoral adenopathy.  Skin:    General: Skin is warm and dry.     Capillary Refill: Capillary refill takes less than 2 seconds.     Findings: No rash.  Neurological:     Mental Status: She is alert and oriented to person, place, and time.     Cranial Nerves: Cranial nerves are intact.     Gait: Gait is intact.     Deep Tendon Reflexes: Reflexes are normal and symmetric.     Reflex Scores:       Brachioradialis reflexes are 2+ on the right side and 2+ on the left side.      Patellar reflexes are 2+ on the right side and 2+ on the left side. Psychiatric:        Attention and Perception: Attention normal.        Mood and Affect: Mood normal.        Speech: Speech normal.        Behavior: Behavior normal. Behavior is cooperative.        Thought Content: Thought content normal.        Judgment: Judgment normal.    Diabetic Foot Exam - Simple   Simple Foot Form Visual Inspection No deformities, no ulcerations, no other skin breakdown bilaterally: Yes Sensation Testing Intact to touch and monofilament testing bilaterally: Yes Pulse Check Posterior Tibialis and Dorsalis pulse intact bilaterally: Yes Comments     Results for orders placed or performed in visit on 02/11/19  Bayer DCA Hb A1c Waived  Result Value Ref Range   HB A1C (BAYER DCA - WAIVED) 6.4 <7.0 %      Assessment & Plan:   Problem List Items Addressed This Visit      Cardiovascular and Mediastinum   Hypertensive heart/kidney disease without HF and with CKD stage III    Chronic, ongoing with CKD 3.  BP at goal, will consider medication reduction next visit if continues below goal.  Continue current medication regimen and adjust as needed.  Enalapril for kidney protection.  CMP today.        Endocrine   Hyperlipidemia associated with type 2 diabetes mellitus (HCC)    Chronic, ongoing.  Continue current medication regimen and adjust as needed.  CMP and lipid check today.      Relevant Medications  Insulin Detemir (LEVEMIR FLEXTOUCH) 100 UNIT/ML Pen   Other Relevant Orders   Comprehensive metabolic panel   Lipid Panel w/o Chol/HDL Ratio out   Controlled type 2 diabetes mellitus with chronic kidney disease, with long-term current use of insulin (HCC)    Chronic, ongoing with A1C improved today at 6.4%.  Continue current medication regimen, however will consider reducing insulin if ongoing goal A1C.  CMP  today.  Continue to check BS at home daily and document.  Praised for improvement in A1C.  Refuses CCM referral.  Return in 3 months.      Relevant Medications   Insulin Detemir (LEVEMIR FLEXTOUCH) 100 UNIT/ML Pen   Other Relevant Orders   Bayer DCA Hb A1c Waived (Completed)     Musculoskeletal and Integument   Osteopenia    Continue Vitamin D and calcium daily.  Check Vit D level today.        Genitourinary   CKD (chronic kidney disease), stage III (HCC)    Chronic, ongoing.  Continue current medication regimen and adjust as needed.  Enalapril for kidney protection.  CMP today.  Renal dosing of Allopurinol done.        Other   MGUS (monoclonal gammopathy of unknown significance)    Continue collaboration with Dr. Grayland Ormond at [facility].      Depression, major, single episode, in partial remission (HCC)    Chronic, stable.  PHQ = 11 and denies SI/HI.  Continue current medication regimen, Sertraline 100 MG daily.  She does not want to increase dose at this time.      Vitamin D deficiency    Check Vitamin D level and adjust as needed.      Relevant Orders   Vit D  25 hydroxy (rtn osteoporosis monitoring)   Chronic gout due to renal impairment of multiple sites without tophus    Chronic, ongoing.  Will dose reduce Allopurinol due to GFR 52 on recent labs and CKD present.  Script for Allopurinol 100 MG sent.  Discussed at length with patient.  Check uric acid level today.      Relevant Orders   Uric acid    Other Visit Diagnoses    Annual physical exam    -  Primary   Relevant Orders   CBC with Differential/Platelet out   TSH   B12 deficiency       Reports history of low B12 level, will recheck this today.   Relevant Orders   Vitamin B12   Flu vaccine need       Relevant Orders   Flu Vaccine QUAD High Dose(Fluad) (Completed)       Follow up plan: Return in about 3 months (around 05/14/2019) for T2DM, HTN/HLD.   LABORATORY TESTING:  - Pap smear: not  applicable  IMMUNIZATIONS:   - Tdap: Tetanus vaccination status reviewed: last tetanus booster within 10 years. - Influenza: Up to date - Pneumovax: Up to date - Prevnar: Up to date - HPV: Not applicable - Zostavax vaccine: Up to date  SCREENING: -Mammogram: Up to date  - Colonoscopy: Up to date  - Bone Density: Up to date  -Hearing Test: Not applicable  -Spirometry: Not applicable   PATIENT COUNSELING:   Advised to take 1 mg of folate supplement per day if capable of pregnancy.   Sexuality: Discussed sexually transmitted diseases, partner selection, use of condoms, avoidance of unintended pregnancy  and contraceptive alternatives.   Advised to avoid cigarette smoking.  I discussed with the patient that  most people either abstain from alcohol or drink within safe limits (<=14/week and <=4 drinks/occasion for males, <=7/weeks and <= 3 drinks/occasion for females) and that the risk for alcohol disorders and other health effects rises proportionally with the number of drinks per week and how often a drinker exceeds daily limits.  Discussed cessation/primary prevention of drug use and availability of treatment for abuse.   Diet: Encouraged to adjust caloric intake to maintain  or achieve ideal body weight, to reduce intake of dietary saturated fat and total fat, to limit sodium intake by avoiding high sodium foods and not adding table salt, and to maintain adequate dietary potassium and calcium preferably from fresh fruits, vegetables, and low-fat dairy products.    stressed the importance of regular exercise  Injury prevention: Discussed safety belts, safety helmets, smoke detector, smoking near bedding or upholstery.   Dental health: Discussed importance of regular tooth brushing, flossing, and dental visits.    NEXT PREVENTATIVE PHYSICAL DUE IN 1 YEAR. Return in about 3 months (around 05/14/2019) for T2DM, HTN/HLD.

## 2019-02-11 NOTE — Assessment & Plan Note (Signed)
Chronic, ongoing with CKD 3.  BP at goal, will consider medication reduction next visit if continues below goal.  Continue current medication regimen and adjust as needed.  Enalapril for kidney protection.  CMP today.

## 2019-02-11 NOTE — Assessment & Plan Note (Signed)
Chronic, ongoing with A1C improved today at 6.4%.  Continue current medication regimen, however will consider reducing insulin if ongoing goal A1C.  CMP today.  Continue to check BS at home daily and document.  Praised for improvement in A1C.  Refuses CCM referral.  Return in 3 months.

## 2019-02-11 NOTE — Assessment & Plan Note (Signed)
Chronic, ongoing.  Continue current medication regimen and adjust as needed.  CMP and lipid check today. 

## 2019-02-12 LAB — URIC ACID: Uric Acid: 3.5 mg/dL (ref 2.5–7.1)

## 2019-02-12 LAB — CBC WITH DIFFERENTIAL/PLATELET
Basophils Absolute: 0.1 10*3/uL (ref 0.0–0.2)
Basos: 1 %
EOS (ABSOLUTE): 0.3 10*3/uL (ref 0.0–0.4)
Eos: 3 %
Hematocrit: 41 % (ref 34.0–46.6)
Hemoglobin: 13.3 g/dL (ref 11.1–15.9)
Immature Grans (Abs): 0.1 10*3/uL (ref 0.0–0.1)
Immature Granulocytes: 1 %
Lymphocytes Absolute: 3.9 10*3/uL — ABNORMAL HIGH (ref 0.7–3.1)
Lymphs: 38 %
MCH: 30.8 pg (ref 26.6–33.0)
MCHC: 32.4 g/dL (ref 31.5–35.7)
MCV: 95 fL (ref 79–97)
Monocytes Absolute: 0.6 10*3/uL (ref 0.1–0.9)
Monocytes: 6 %
Neutrophils Absolute: 5.3 10*3/uL (ref 1.4–7.0)
Neutrophils: 51 %
Platelets: 287 10*3/uL (ref 150–450)
RBC: 4.32 x10E6/uL (ref 3.77–5.28)
RDW: 14.4 % (ref 11.7–15.4)
WBC: 10.2 10*3/uL (ref 3.4–10.8)

## 2019-02-12 LAB — COMPREHENSIVE METABOLIC PANEL
ALT: 25 IU/L (ref 0–32)
AST: 45 IU/L — ABNORMAL HIGH (ref 0–40)
Albumin/Globulin Ratio: 1.7 (ref 1.2–2.2)
Albumin: 4.7 g/dL (ref 3.8–4.8)
Alkaline Phosphatase: 118 IU/L — ABNORMAL HIGH (ref 39–117)
BUN/Creatinine Ratio: 14 (ref 12–28)
BUN: 18 mg/dL (ref 8–27)
Bilirubin Total: 0.4 mg/dL (ref 0.0–1.2)
CO2: 23 mmol/L (ref 20–29)
Calcium: 10 mg/dL (ref 8.7–10.3)
Chloride: 103 mmol/L (ref 96–106)
Creatinine, Ser: 1.28 mg/dL — ABNORMAL HIGH (ref 0.57–1.00)
GFR calc Af Amer: 50 mL/min/{1.73_m2} — ABNORMAL LOW (ref >59)
GFR calc non Af Amer: 44 mL/min/{1.73_m2} — ABNORMAL LOW (ref >59)
Globulin, Total: 2.8 g/dL (ref 1.5–4.5)
Glucose: 101 mg/dL — ABNORMAL HIGH (ref 65–99)
Potassium: 4.5 mmol/L (ref 3.5–5.2)
Sodium: 141 mmol/L (ref 134–144)
Total Protein: 7.5 g/dL (ref 6.0–8.5)

## 2019-02-12 LAB — TSH: TSH: 2.72 u[IU]/mL (ref 0.450–4.500)

## 2019-02-12 LAB — LIPID PANEL W/O CHOL/HDL RATIO
Cholesterol, Total: 142 mg/dL (ref 100–199)
HDL: 40 mg/dL (ref >39)
LDL Chol Calc (NIH): 64 mg/dL (ref 0–99)
Triglycerides: 234 mg/dL — ABNORMAL HIGH (ref 0–149)
VLDL Cholesterol Cal: 38 mg/dL (ref 5–40)

## 2019-02-12 LAB — VITAMIN B12: Vitamin B-12: 396 pg/mL (ref 232–1245)

## 2019-02-12 LAB — VITAMIN D 25 HYDROXY (VIT D DEFICIENCY, FRACTURES): Vit D, 25-Hydroxy: 35.2 ng/mL (ref 30.0–100.0)

## 2019-02-23 ENCOUNTER — Other Ambulatory Visit: Payer: Self-pay

## 2019-02-23 DIAGNOSIS — Z1231 Encounter for screening mammogram for malignant neoplasm of breast: Secondary | ICD-10-CM

## 2019-02-23 DIAGNOSIS — N6092 Unspecified benign mammary dysplasia of left breast: Secondary | ICD-10-CM

## 2019-02-25 NOTE — Addendum Note (Signed)
Addended by: Lesly Rubenstein on: 02/25/2019 09:24 AM   Modules accepted: Orders

## 2019-03-05 ENCOUNTER — Other Ambulatory Visit: Payer: Self-pay | Admitting: Nurse Practitioner

## 2019-03-07 ENCOUNTER — Other Ambulatory Visit: Payer: Self-pay | Admitting: Unknown Physician Specialty

## 2019-03-27 ENCOUNTER — Other Ambulatory Visit: Payer: Self-pay | Admitting: Nurse Practitioner

## 2019-04-01 ENCOUNTER — Other Ambulatory Visit: Payer: Self-pay | Admitting: General Surgery

## 2019-04-01 DIAGNOSIS — N6092 Unspecified benign mammary dysplasia of left breast: Secondary | ICD-10-CM

## 2019-04-06 DIAGNOSIS — R69 Illness, unspecified: Secondary | ICD-10-CM | POA: Diagnosis not present

## 2019-04-07 NOTE — Addendum Note (Signed)
Addended byHervey Ard on: 04/07/2019 08:44 AM   Modules accepted: Orders

## 2019-04-08 ENCOUNTER — Ambulatory Visit: Payer: Medicare HMO | Admitting: Surgery

## 2019-04-13 DIAGNOSIS — E119 Type 2 diabetes mellitus without complications: Secondary | ICD-10-CM | POA: Diagnosis not present

## 2019-04-13 LAB — HM DIABETES EYE EXAM

## 2019-04-17 ENCOUNTER — Ambulatory Visit
Admission: RE | Admit: 2019-04-17 | Discharge: 2019-04-17 | Disposition: A | Discharge status: Home / Self Care | Payer: Medicare HMO | Source: Ambulatory Visit | Attending: Surgery | Admitting: Surgery

## 2019-04-17 DIAGNOSIS — N6092 Unspecified benign mammary dysplasia of left breast: Secondary | ICD-10-CM | POA: Insufficient documentation

## 2019-04-17 DIAGNOSIS — Z1231 Encounter for screening mammogram for malignant neoplasm of breast: Secondary | ICD-10-CM | POA: Diagnosis not present

## 2019-04-23 ENCOUNTER — Other Ambulatory Visit: Payer: Self-pay | Admitting: Nurse Practitioner

## 2019-04-23 DIAGNOSIS — N6092 Unspecified benign mammary dysplasia of left breast: Secondary | ICD-10-CM | POA: Diagnosis not present

## 2019-05-15 ENCOUNTER — Encounter: Payer: Self-pay | Admitting: Nurse Practitioner

## 2019-05-15 ENCOUNTER — Ambulatory Visit (INDEPENDENT_AMBULATORY_CARE_PROVIDER_SITE_OTHER): Payer: Medicare HMO | Admitting: Nurse Practitioner

## 2019-05-15 ENCOUNTER — Other Ambulatory Visit: Payer: Self-pay

## 2019-05-15 VITALS — BP 104/71 | HR 93 | Temp 97.7°F

## 2019-05-15 DIAGNOSIS — E1169 Type 2 diabetes mellitus with other specified complication: Secondary | ICD-10-CM | POA: Diagnosis not present

## 2019-05-15 DIAGNOSIS — R69 Illness, unspecified: Secondary | ICD-10-CM | POA: Diagnosis not present

## 2019-05-15 DIAGNOSIS — N182 Chronic kidney disease, stage 2 (mild): Secondary | ICD-10-CM | POA: Diagnosis not present

## 2019-05-15 DIAGNOSIS — N183 Chronic kidney disease, stage 3 unspecified: Secondary | ICD-10-CM | POA: Diagnosis not present

## 2019-05-15 DIAGNOSIS — E785 Hyperlipidemia, unspecified: Secondary | ICD-10-CM

## 2019-05-15 DIAGNOSIS — E1122 Type 2 diabetes mellitus with diabetic chronic kidney disease: Secondary | ICD-10-CM

## 2019-05-15 DIAGNOSIS — E1159 Type 2 diabetes mellitus with other circulatory complications: Secondary | ICD-10-CM

## 2019-05-15 DIAGNOSIS — N1832 Chronic kidney disease, stage 3b: Secondary | ICD-10-CM | POA: Diagnosis not present

## 2019-05-15 DIAGNOSIS — Z794 Long term (current) use of insulin: Secondary | ICD-10-CM

## 2019-05-15 DIAGNOSIS — E538 Deficiency of other specified B group vitamins: Secondary | ICD-10-CM | POA: Diagnosis not present

## 2019-05-15 DIAGNOSIS — I1310 Hypertensive heart and chronic kidney disease without heart failure, with stage 1 through stage 4 chronic kidney disease, or unspecified chronic kidney disease: Secondary | ICD-10-CM | POA: Diagnosis not present

## 2019-05-15 DIAGNOSIS — E1121 Type 2 diabetes mellitus with diabetic nephropathy: Secondary | ICD-10-CM

## 2019-05-15 DIAGNOSIS — I10 Essential (primary) hypertension: Secondary | ICD-10-CM

## 2019-05-15 DIAGNOSIS — F324 Major depressive disorder, single episode, in partial remission: Secondary | ICD-10-CM

## 2019-05-15 DIAGNOSIS — I152 Hypertension secondary to endocrine disorders: Secondary | ICD-10-CM

## 2019-05-15 DIAGNOSIS — N1831 Chronic kidney disease, stage 3a: Secondary | ICD-10-CM | POA: Diagnosis not present

## 2019-05-15 LAB — BAYER DCA HB A1C WAIVED: HB A1C (BAYER DCA - WAIVED): 6.9 % (ref <7.0)

## 2019-05-15 LAB — MICROALBUMIN, URINE WAIVED
Creatinine, Urine Waived: 100 mg/dL (ref 10–300)
Microalb, Ur Waived: 30 mg/L — ABNORMAL HIGH (ref 0–19)
Microalb/Creat Ratio: <30 mg/g (ref <30)

## 2019-05-15 MED ORDER — ALLOPURINOL 100 MG PO TABS: 50.0000 mg | ORAL_TABLET | Freq: Every day | ORAL | 3 refills | Status: DC

## 2019-05-15 NOTE — Progress Notes (Signed)
BP 104/71   Pulse 93   Temp 97.7 F (36.5 C) (Oral)   LMP  (LMP Unknown)   SpO2 96%    Subjective:    Patient ID: Kara Leach Parents, female    DOB: March 09, 1951, 69 y.o.   MRN: [id number]  HPI: Kara Leach is a 69 y.o. female  Chief Complaint  Patient presents with  . Diabetes  . Hyperlipidemia  . Hypertension   DIABETES Last A1C 6.4% in November.  Continues on Novolog pre meal and Levemir + Victoza 1.2 MG daily. Has been walking a little more if not raining or snowing. Hypoglycemic episodes:no Polydipsia/polyuria: no Visual disturbance: no Chest pain: no Paresthesias: no Glucose Monitoring: yes  Accucheck frequency: BID  Fasting glucose: 123 this morning, averaging 118 t o128  Post prandial:  Evening: 140's  Before meals: Taking Insulin?: yes  Long acting insulin: Levemir 60 units  Short acting insulin: Novolog 16 units Blood Pressure Monitoring: daily Retinal Examination: Up To Date Foot Exam: Up to Date Pneumovax: Up to Date Influenza: Up to Date Aspirin: yes   HYPERTENSION / HYPERLIPIDEMIA Continues on Amlodipine, Enalapril, Metoprolol, and ASA + Simvastatin. Satisfied with current treatment? yes Duration of hypertension: chronic BP monitoring frequency: daily BP range:  average 130's over 70's at home, this morning 138/75 BP medication side effects: no Duration of hyperlipidemia: chronic Cholesterol medication side effects: no Cholesterol supplements: none Medication compliance: good compliance Aspirin: yes Recent stressors: no Recurrent headaches: no Visual changes: no Palpitations: no Dyspnea: no Chest pain: no Lower extremity edema: no Dizzy/lightheaded: no   CHRONIC KIDNEY DISEASE With GFR ranging from 44-56 over past two years. Is scheduled to see kidney provider at the end of March. CKD status: stable Medications renally dose: yes Previous renal evaluation: no Pneumovax:  Up to Date Influenza Vaccine:  Up to Date  CrCl based on  recent labs and weight == 48  DEPRESSION Continues on Sertraline 100 MG daily. Mood status: stable Satisfied with current treatment?: yes Symptom severity: mild  Duration of current treatment : chronic Side effects: no Medication compliance: good compliance Psychotherapy/counseling: none Depressed mood: no Anxious mood: no Anhedonia: no Significant weight loss or gain: no Insomnia: ocasional Fatigue: no Feelings of worthlessness or guilt: no Impaired concentration/indecisiveness: no Suicidal ideations: no Hopelessness: no Crying spells: no Depression screen [facility] 2/9 05/15/2019 02/11/2019 02/04/2019 11/07/2018 08/06/2018  Decreased Interest 0 1 0 1 1  Down, Depressed, Hopeless 1 1 0 1 1  PHQ - 2 Score 1 2 0 2 2  Altered sleeping 2 3 - 3 2  Tired, decreased energy 1 3 - 3 1  Change in appetite 0 1 - 0 0  Feeling bad or failure about yourself  0 2 - 0 0  Trouble concentrating 0 0 - 0 0  Moving slowly or fidgety/restless 0 0 - 0 0  Suicidal thoughts 0 0 - 0 0  PHQ-9 Score 4 11 - 8 5  Difficult doing work/chores Not difficult at all - - Not difficult at all -  Some recent data might be hidden    Relevant past medical, surgical, family and social history reviewed and updated as indicated. Interim medical history since our last visit reviewed. Allergies and medications reviewed and updated.  Review of Systems  Constitutional: Negative for activity change, appetite change, diaphoresis, fatigue and fever.  Respiratory: Negative for cough, chest tightness and shortness of breath.   Cardiovascular: Negative for chest pain, palpitations and leg swelling.  Gastrointestinal: Negative.  Endocrine: Negative for cold intolerance, heat intolerance, polydipsia, polyphagia and polyuria.  Neurological: Negative.   Psychiatric/Behavioral: Negative.     Per HPI unless specifically indicated above     Objective:    BP 104/71   Pulse 93   Temp 97.7 F (36.5 C) (Oral)   LMP  (LMP  Unknown)   SpO2 96%   Wt Readings from Last 3 Encounters:  02/11/19 156 lb (70.8 kg)  02/04/19 153 lb (69.4 kg)  01/19/19 158 lb (71.7 kg)    Physical Exam Vitals and nursing note reviewed.  Constitutional:      General: She is awake. She is not in acute distress.    Appearance: She is well-developed. She is not ill-appearing.  HENT:     Head: Normocephalic.     Right Ear: Hearing normal.     Left Ear: Hearing normal.  Eyes:     General: Lids are normal.        Right eye: No discharge.        Left eye: No discharge.     Conjunctiva/sclera: Conjunctivae normal.     Pupils: Pupils are equal, round, and reactive to light.  Neck:     Thyroid: No thyromegaly.     Vascular: No carotid bruit.  Cardiovascular:     Rate and Rhythm: Normal rate and regular rhythm.     Heart sounds: Normal heart sounds. No murmur. No gallop.   Pulmonary:     Effort: Pulmonary effort is normal. No accessory muscle usage or respiratory distress.     Breath sounds: Normal breath sounds.  Abdominal:     General: Bowel sounds are normal.     Palpations: Abdomen is soft.     Tenderness: There is no abdominal tenderness.  Musculoskeletal:     Cervical back: Normal range of motion and neck supple.     Right lower leg: No edema.     Left lower leg: No edema.  Lymphadenopathy:     Cervical: No cervical adenopathy.  Skin:    General: Skin is warm and dry.  Neurological:     Mental Status: She is alert and oriented to person, place, and time.  Psychiatric:        Attention and Perception: Attention normal.        Mood and Affect: Mood normal.        Speech: Speech normal.        Behavior: Behavior normal. Behavior is cooperative.        Thought Content: Thought content normal.    Diabetic Foot Exam - Simple   No data filed     Results for orders placed or performed in visit on 04/15/19  HM DIABETES EYE EXAM  Result Value Ref Range   HM Diabetic Eye Exam No Retinopathy No Retinopathy        Assessment & Plan:   Problem List Items Addressed This Visit      Cardiovascular and Mediastinum   Hypertension associated with diabetes ([place])    Chronic, ongoing with CKD 3.  BP at goal home and office, will consider medication reduction next visit if continues below goal.  Continue current medication regimen and adjust as needed.  Enalapril for kidney protection.  CMP today.      Relevant Orders   Bayer DCA Hb A1c Waived   Microalbumin, Urine Waived   Comprehensive metabolic panel     Endocrine   Hyperlipidemia associated with type 2 diabetes mellitus (HCC)    Chronic,  ongoing.  Continue current medication regimen and adjust as needed.  CMP and lipid check today.      Relevant Orders   Bayer DCA Hb A1c Waived   Comprehensive metabolic panel   Lipid Panel w/o Chol/HDL Ratio   CKD stage 3 due to type 2 diabetes mellitus (HCC)    Chronic, ongoing.  A1C today 6.9%.  Will reduce Allopurinol due to GFR 44 on recent labs and CKD present.  Script for Allopurinol 50 MG sent and recommend she use remainder of tablets at home, but cut them in 1/2.  Discussed at length with patient.  Check uric acid level next visit.  Upcoming visit with nephrology.      Type 2 diabetes mellitus with stage 3b chronic kidney disease, with long-term current use of insulin (HCC) - Primary    Chronic, ongoing with A1C 6.9% today and urine ALB 30 with A:C <30.  Continue current medication regimen, however will consider reducing insulin if ongoing goal A1C.  CMP today.  Continue to check BS at home daily and document.  Refuses CCM referral.  Refer to CKD plan for dose reductions. Return in 3 months.      Relevant Orders   Bayer DCA Hb A1c Waived     Other   Depression, major, single episode, in partial remission (HCC)    Chronic, stable.  PHQ = 4 and denies SI/HI.  Continue current medication regimen, Sertraline 100 MG daily, and adjust as needed. Recommend meditation and relaxation techniques at home.       B12 deficiency    Low normal level previous visit, will recheck this today and initiate supplement as needed.      Relevant Orders   Vitamin B12       Follow up plan: Return in about 3 months (around 08/12/2019) for T2DM, HTN/HLD, Mood, Gout.

## 2019-05-15 NOTE — Assessment & Plan Note (Signed)
Low normal level previous visit, will recheck this today and initiate supplement as needed.

## 2019-05-15 NOTE — Assessment & Plan Note (Signed)
Chronic, ongoing.  Continue current medication regimen and adjust as needed.  CMP and lipid check today. 

## 2019-05-15 NOTE — Assessment & Plan Note (Signed)
Chronic, ongoing with A1C 6.9% today and urine ALB 30 with A:C <30.  Continue current medication regimen, however will consider reducing insulin if ongoing goal A1C.  CMP today.  Continue to check BS at home daily and document.  Refuses CCM referral.  Refer to CKD plan for dose reductions. Return in 3 months.

## 2019-05-15 NOTE — Assessment & Plan Note (Addendum)
Chronic, ongoing with CKD 3.  BP at goal home and office, will consider medication reduction next visit if continues below goal.  Continue current medication regimen and adjust as needed.  Enalapril for kidney protection.  CMP today.

## 2019-05-15 NOTE — Assessment & Plan Note (Signed)
Chronic, ongoing.  A1C today 6.9%.  Will reduce Allopurinol due to GFR 44 on recent labs and CKD present.  Script for Allopurinol 50 MG sent and recommend she use remainder of tablets at home, but cut them in 1/2.  Discussed at length with patient.  Check uric acid level next visit.  Upcoming visit with nephrology.

## 2019-05-15 NOTE — Assessment & Plan Note (Addendum)
Chronic, stable.  PHQ = 4 and denies SI/HI.  Continue current medication regimen, Sertraline 100 MG daily, and adjust as needed. Recommend meditation and relaxation techniques at home.

## 2019-05-15 NOTE — Assessment & Plan Note (Deleted)
Chronic, ongoing.  Will dose reduce Allopurinol due to GFR 44 on recent labs and CKD present.  Script for Allopurinol 50 MG sent and recommend she use remainder of tablets at home, but cut them in 1/2.  Discussed at length with patient.  Check uric acid level next visit.  Upcoming visit with nephrology.

## 2019-05-15 NOTE — Patient Instructions (Signed)
Carbohydrate Counting for Diabetes Mellitus, Adult  Carbohydrate counting is a method of keeping track of how many carbohydrates you eat. Eating carbohydrates naturally increases the amount of sugar (glucose) in the blood. Counting how many carbohydrates you eat helps keep your blood glucose within normal limits, which helps you manage your diabetes (diabetes mellitus). It is important to know how many carbohydrates you can safely have in each meal. This is different for every person. A diet and nutrition specialist (registered dietitian) can help you make a meal plan and calculate how many carbohydrates you should have at each meal and snack. Carbohydrates are found in the following foods:  Grains, such as breads and cereals.  Dried beans and soy products.  Starchy vegetables, such as potatoes, peas, and corn.  Fruit and fruit juices.  Milk and yogurt.  Sweets and snack foods, such as cake, cookies, candy, chips, and soft drinks. How do I count carbohydrates? There are two ways to count carbohydrates in food. You can use either of the methods or a combination of both. Reading "Nutrition Facts" on packaged food The "Nutrition Facts" list is included on the labels of almost all packaged foods and beverages in the [place] It includes:  The serving size.  Information about nutrients in each serving, including the grams (g) of carbohydrate per serving. To use the "Nutrition Facts":  Decide how many servings you will have.  Multiply the number of servings by the number of carbohydrates per serving.  The resulting number is the total amount of carbohydrates that you will be having. Learning standard serving sizes of other foods When you eat carbohydrate foods that are not packaged or do not include "Nutrition Facts" on the label, you need to measure the servings in order to count the amount of carbohydrates:  Measure the foods that you will eat with a food scale or measuring cup, if  needed.  Decide how many standard-size servings you will eat.  Multiply the number of servings by 15. Most carbohydrate-rich foods have about 15 g of carbohydrates per serving. ? For example, if you eat 8 oz (170 g) of strawberries, you will have eaten 2 servings and 30 g of carbohydrates (2 servings x 15 g = 30 g).  For foods that have more than one food mixed, such as soups and casseroles, you must count the carbohydrates in each food that is included. The following list contains standard serving sizes of common carbohydrate-rich foods. Each of these servings has about 15 g of carbohydrates:   hamburger bun or  English muffin.   oz (15 mL) syrup.   oz (14 g) jelly.  1 slice of bread.  1 six-inch tortilla.  3 oz (85 g) cooked rice or pasta.  4 oz (113 g) cooked dried beans.  4 oz (113 g) starchy vegetable, such as peas, corn, or potatoes.  4 oz (113 g) hot cereal.  4 oz (113 g) mashed potatoes or  of a large baked potato.  4 oz (113 g) canned or frozen fruit.  4 oz (120 mL) fruit juice.  4-6 crackers.  6 chicken nuggets.  6 oz (170 g) unsweetened dry cereal.  6 oz (170 g) plain fat-free yogurt or yogurt sweetened with artificial sweeteners.  8 oz (240 mL) milk.  8 oz (170 g) fresh fruit or one small piece of fruit.  24 oz (680 g) popped popcorn. Example of carbohydrate counting Sample meal  3 oz (85 g) chicken breast.  6 oz (170 g)   brown rice.  4 oz (113 g) corn.  8 oz (240 mL) milk.  8 oz (170 g) strawberries with sugar-free whipped topping. Carbohydrate calculation 1. Identify the foods that contain carbohydrates: ? Rice. ? Corn. ? Milk. ? Strawberries. 2. Calculate how many servings you have of each food: ? 2 servings rice. ? 1 serving corn. ? 1 serving milk. ? 1 serving strawberries. 3. Multiply each number of servings by 15 g: ? 2 servings rice x 15 g = 30 g. ? 1 serving corn x 15 g = 15 g. ? 1 serving milk x 15 g = 15 g. ? 1  serving strawberries x 15 g = 15 g. 4. Add together all of the amounts to find the total grams of carbohydrates eaten: ? 30 g + 15 g + 15 g + 15 g = 75 g of carbohydrates total. Summary  Carbohydrate counting is a method of keeping track of how many carbohydrates you eat.  Eating carbohydrates naturally increases the amount of sugar (glucose) in the blood.  Counting how many carbohydrates you eat helps keep your blood glucose within normal limits, which helps you manage your diabetes.  A diet and nutrition specialist (registered dietitian) can help you make a meal plan and calculate how many carbohydrates you should have at each meal and snack. This information is not intended to replace advice given to you by your health care provider. Make sure you discuss any questions you have with your health care provider. Document Revised: 09/27/2016 Document Reviewed: 08/17/2015 Elsevier Patient Education  2020 Elsevier Inc.  

## 2019-05-16 LAB — LIPID PANEL W/O CHOL/HDL RATIO
Cholesterol, Total: 144 mg/dL (ref 100–199)
HDL: 40 mg/dL (ref >39)
LDL Chol Calc (NIH): 69 mg/dL (ref 0–99)
Triglycerides: 210 mg/dL — ABNORMAL HIGH (ref 0–149)
VLDL Cholesterol Cal: 35 mg/dL (ref 5–40)

## 2019-05-16 LAB — COMPREHENSIVE METABOLIC PANEL
ALT: 52 IU/L — ABNORMAL HIGH (ref 0–32)
AST: 116 IU/L — ABNORMAL HIGH (ref 0–40)
Albumin/Globulin Ratio: 1.4 (ref 1.2–2.2)
Albumin: 4.4 g/dL (ref 3.8–4.8)
Alkaline Phosphatase: 114 IU/L (ref 39–117)
BUN/Creatinine Ratio: 15 (ref 12–28)
BUN: 20 mg/dL (ref 8–27)
Bilirubin Total: 0.3 mg/dL (ref 0.0–1.2)
CO2: 21 mmol/L (ref 20–29)
Calcium: 9.7 mg/dL (ref 8.7–10.3)
Chloride: 104 mmol/L (ref 96–106)
Creatinine, Ser: 1.3 mg/dL — ABNORMAL HIGH (ref 0.57–1.00)
GFR calc Af Amer: 49 mL/min/{1.73_m2} — ABNORMAL LOW (ref >59)
GFR calc non Af Amer: 43 mL/min/{1.73_m2} — ABNORMAL LOW (ref >59)
Globulin, Total: 3.1 g/dL (ref 1.5–4.5)
Glucose: 140 mg/dL — ABNORMAL HIGH (ref 65–99)
Potassium: 4.6 mmol/L (ref 3.5–5.2)
Sodium: 143 mmol/L (ref 134–144)
Total Protein: 7.5 g/dL (ref 6.0–8.5)

## 2019-05-16 LAB — VITAMIN B12: Vitamin B-12: 476 pg/mL (ref 232–1245)

## 2019-05-18 ENCOUNTER — Telehealth: Payer: Self-pay | Admitting: Nurse Practitioner

## 2019-05-18 MED ORDER — VICTOZA 18 MG/3ML ~~LOC~~ SOPN: PEN_INJECTOR | SUBCUTANEOUS | 3 refills | Status: DC

## 2019-05-18 NOTE — Progress Notes (Signed)
Spoke to patient on telephone and reviewed labs, refer to phone note.

## 2019-05-18 NOTE — Telephone Encounter (Signed)
Spoke to patient on telephone about labs.  Kidney function continues to show stable kidney disease with no decline.  Liver function tests are elevation from previous AST>ALT.  She does take Tylenol twice a day and Simvastatin daily + has occasional (not often) alcohol.  Discussed with her to cut back on Tylenol and avoid alcohol until next visit, then will recheck these and if still elevated will consider Hep panel and liver u/s.  Discussed focus on diet too, healthy diet options.  She verbalized understanding.

## 2019-05-19 ENCOUNTER — Other Ambulatory Visit: Payer: Self-pay | Admitting: Nurse Practitioner

## 2019-05-19 NOTE — Telephone Encounter (Signed)
Requested Prescriptions  Pending Prescriptions Disp Refills  . sertraline (ZOLOFT) 100 MG tablet [Pharmacy Med Name: SERTRALINE HCL 100 MG TABLET] 90 tablet 0    Sig: TAKE 1 TABLET (100 MG TOTAL) EVERY MORNING BY MOUTH.     Psychiatry:  Antidepressants - SSRI Passed - 05/19/2019  1:02 AM      Passed - Completed PHQ-2 or PHQ-9 in the last 360 days.      Passed - Valid encounter within last 6 months    Recent Outpatient Visits          4 days ago Type 2 diabetes mellitus with stage 3b chronic kidney disease, with long-term current use of insulin ([place])   [place], [place] T, NP   3 months ago Annual physical exam   [place], Jolene T, NP   6 months ago Controlled type 2 diabetes mellitus with stage 2 chronic kidney disease, with long-term current use of insulin ([place])   [place], Jolene T, NP   9 months ago Controlled type 2 diabetes mellitus with stage 2 chronic kidney disease, with long-term current use of insulin ([place])   [place], Jolene T, NP   1 year ago Controlled type 2 diabetes mellitus with stage 2 chronic kidney disease, with long-term current use of insulin ([place])   [place], Barbaraann Faster, NP      Future Appointments            In 2 months Cannady, Barbaraann Faster, NP [facility]   In 9 months  [facility]

## 2019-06-04 ENCOUNTER — Other Ambulatory Visit: Payer: Self-pay | Admitting: Nurse Practitioner

## 2019-06-09 ENCOUNTER — Other Ambulatory Visit: Payer: Self-pay | Admitting: Nurse Practitioner

## 2019-06-09 NOTE — Telephone Encounter (Signed)
Requested Prescriptions  Pending Prescriptions Disp Refills  . enalapril (VASOTEC) 2.5 MG tablet [Pharmacy Med Name: ENALAPRIL MALEATE 2.5 MG TAB] 90 tablet 0    Sig: TAKE 1 TABLET BY MOUTH EVERY DAY     Cardiovascular:  ACE Inhibitors Failed - 06/09/2019  1:32 AM      Failed - Cr in normal range and within 180 days    Creatinine  Date Value Ref Range Status  06/15/2014 1.07 (H) mg/dL Final    Comment:    0.44-1.00 NOTE: New Reference Range  05/25/14    Creatinine, Ser  Date Value Ref Range Status  05/15/2019 1.30 (H) 0.57 - 1.00 mg/dL Final         Passed - K in normal range and within 180 days    Potassium  Date Value Ref Range Status  05/15/2019 4.6 3.5 - 5.2 mmol/L Final  06/15/2014 4.6 mmol/L Final    Comment:    3.5-5.1 NOTE: New Reference Range  05/25/14          Passed - Patient is not pregnant      Passed - Last BP in normal range    BP Readings from Last 1 Encounters:  05/15/19 104/71         Passed - Valid encounter within last 6 months    Recent Outpatient Visits          3 weeks ago Type 2 diabetes mellitus with stage 3b chronic kidney disease, with long-term current use of insulin ([place])   [place], [place] T, NP   3 months ago Annual physical exam   [place], Jolene T, NP   7 months ago Controlled type 2 diabetes mellitus with stage 2 chronic kidney disease, with long-term current use of insulin ([place])   [place], Jolene T, NP   10 months ago Controlled type 2 diabetes mellitus with stage 2 chronic kidney disease, with long-term current use of insulin ([place])   [place], Jolene T, NP   1 year ago Controlled type 2 diabetes mellitus with stage 2 chronic kidney disease, with long-term current use of insulin ([place])   [place], Barbaraann Faster, NP      Future Appointments            In 2 months Cannady, Barbaraann Faster, NP [facility]   In 8 months  [facility]

## 2019-06-28 ENCOUNTER — Other Ambulatory Visit: Payer: Self-pay | Admitting: Nurse Practitioner

## 2019-06-28 NOTE — Telephone Encounter (Signed)
Requested Prescriptions  Pending Prescriptions Disp Refills  . LEVEMIR FLEXTOUCH 100 UNIT/ML FlexPen [Pharmacy Med Name: LEVEMIR FLEXTOUCH 100 UNIT/ML] 15 mL 3    Sig: INJECT 60 UNITS INTO THE SKIN DAILY AT 10 PM.     Endocrinology:  Diabetes - Insulins Passed - 06/28/2019  2:13 PM      Passed - HBA1C is between 0 and 7.9 and within 180 days    Hemoglobin A1C  Date Value Ref Range Status  11/12/2013 13.4 (H) 4.2 - 6.3 % Final    Comment:    The American Diabetes Association recommends that a primary goal of therapy should be <7% and that physicians should reevaluate the treatment regimen in patients with HbA1c values consistently >8%.    HB A1C (BAYER DCA - WAIVED)  Date Value Ref Range Status  05/15/2019 6.9 <7.0 % Final    Comment:                                          Diabetic Adult            <7.0                                       Healthy Adult        4.3 - 5.7                                                           (DCCT/NGSP) American Diabetes Association's Summary of Glycemic Recommendations for Adults with Diabetes: Hemoglobin A1c <7.0%. More stringent glycemic goals (A1c <6.0%) may further reduce complications at the cost of increased risk of hypoglycemia.          Passed - Valid encounter within last 6 months    Recent Outpatient Visits          1 month ago Type 2 diabetes mellitus with stage 3b chronic kidney disease, with long-term current use of insulin ([place])   [place], [place] T, NP   4 months ago Annual physical exam   [place] Cannady, Jolene T, NP   7 months ago Controlled type 2 diabetes mellitus with stage 2 chronic kidney disease, with long-term current use of insulin ([place])   [place], Jolene T, NP   10 months ago Controlled type 2 diabetes mellitus with stage 2 chronic kidney disease, with long-term current use of insulin ([place])   [place], Jolene T, NP   1  year ago Controlled type 2 diabetes mellitus with stage 2 chronic kidney disease, with long-term current use of insulin ([place])   [place], Barbaraann Faster, NP      Future Appointments            In 1 month Cannady, Barbaraann Faster, NP [facility]   In 7 months  [facility]

## 2019-06-29 ENCOUNTER — Other Ambulatory Visit: Payer: Self-pay | Admitting: Nurse Practitioner

## 2019-07-06 DIAGNOSIS — R69 Illness, unspecified: Secondary | ICD-10-CM | POA: Diagnosis not present

## 2019-07-14 DIAGNOSIS — Z0100 Encounter for examination of eyes and vision without abnormal findings: Secondary | ICD-10-CM | POA: Diagnosis not present

## 2019-08-12 ENCOUNTER — Ambulatory Visit (INDEPENDENT_AMBULATORY_CARE_PROVIDER_SITE_OTHER): Payer: Medicare HMO | Admitting: Nurse Practitioner

## 2019-08-12 ENCOUNTER — Other Ambulatory Visit: Payer: Self-pay

## 2019-08-12 ENCOUNTER — Encounter: Payer: Self-pay | Admitting: Nurse Practitioner

## 2019-08-12 VITALS — BP 135/82 | HR 93 | Temp 98.0°F | Wt 157.2 lb

## 2019-08-12 DIAGNOSIS — E1159 Type 2 diabetes mellitus with other circulatory complications: Secondary | ICD-10-CM | POA: Diagnosis not present

## 2019-08-12 DIAGNOSIS — E785 Hyperlipidemia, unspecified: Secondary | ICD-10-CM

## 2019-08-12 DIAGNOSIS — D472 Monoclonal gammopathy: Secondary | ICD-10-CM | POA: Diagnosis not present

## 2019-08-12 DIAGNOSIS — I10 Essential (primary) hypertension: Secondary | ICD-10-CM | POA: Diagnosis not present

## 2019-08-12 DIAGNOSIS — E1122 Type 2 diabetes mellitus with diabetic chronic kidney disease: Secondary | ICD-10-CM | POA: Diagnosis not present

## 2019-08-12 DIAGNOSIS — N183 Chronic kidney disease, stage 3 unspecified: Secondary | ICD-10-CM | POA: Diagnosis not present

## 2019-08-12 DIAGNOSIS — E1121 Type 2 diabetes mellitus with diabetic nephropathy: Secondary | ICD-10-CM | POA: Diagnosis not present

## 2019-08-12 DIAGNOSIS — E1169 Type 2 diabetes mellitus with other specified complication: Secondary | ICD-10-CM

## 2019-08-12 DIAGNOSIS — I152 Hypertension secondary to endocrine disorders: Secondary | ICD-10-CM

## 2019-08-12 DIAGNOSIS — R69 Illness, unspecified: Secondary | ICD-10-CM | POA: Diagnosis not present

## 2019-08-12 DIAGNOSIS — N1832 Chronic kidney disease, stage 3b: Secondary | ICD-10-CM | POA: Diagnosis not present

## 2019-08-12 DIAGNOSIS — Z23 Encounter for immunization: Secondary | ICD-10-CM

## 2019-08-12 DIAGNOSIS — F324 Major depressive disorder, single episode, in partial remission: Secondary | ICD-10-CM

## 2019-08-12 DIAGNOSIS — Z794 Long term (current) use of insulin: Secondary | ICD-10-CM

## 2019-08-12 LAB — BAYER DCA HB A1C WAIVED: HB A1C (BAYER DCA - WAIVED): 6.6 % (ref <7.0)

## 2019-08-12 MED ORDER — SERTRALINE HCL 100 MG PO TABS: 100.0000 mg | ORAL_TABLET | Freq: Every morning | ORAL | 4 refills | Status: DC

## 2019-08-12 MED ORDER — ENALAPRIL MALEATE 2.5 MG PO TABS: 2.5000 mg | ORAL_TABLET | Freq: Every day | ORAL | 4 refills | Status: DC

## 2019-08-12 NOTE — Assessment & Plan Note (Signed)
Chronic, ongoing.  A1C today 6.6%.  Continue Enalapril for kidney protection and continue to renal dose medications as needed.  Upcoming visit with nephrology.

## 2019-08-12 NOTE — Assessment & Plan Note (Signed)
Chronic, stable.  Denies SI/HI.  Continue current medication regimen, Sertraline 100 MG daily, and adjust as needed. Recommend meditation and relaxation techniques at home. 

## 2019-08-12 NOTE — Assessment & Plan Note (Signed)
Chronic, ongoing with CKD 3.  BP at goal home and office.  Continue current medication regimen and adjust as needed.  Enalapril for kidney protection.  BMP today.

## 2019-08-12 NOTE — Patient Instructions (Signed)
Pneumococcal Polysaccharide Vaccine (PPSV23): What You Need to Know 1. Why get vaccinated? Pneumococcal polysaccharide vaccine (PPSV23) can prevent pneumococcal disease. Pneumococcal disease refers to any illness caused by pneumococcal bacteria. These bacteria can cause many types of illnesses, including pneumonia, which is an infection of the lungs. Pneumococcal bacteria are one of the most common causes of pneumonia. Besides pneumonia, pneumococcal bacteria can also cause:  Ear infections  Sinus infections  Meningitis (infection of the tissue covering the brain and spinal cord)  Bacteremia (bloodstream infection) Anyone can get pneumococcal disease, but children under 2 years of age, people with certain medical conditions, adults 65 years or older, and cigarette smokers are at the highest risk. Most pneumococcal infections are mild. However, some can result in long-term problems, such as brain damage or hearing loss. Meningitis, bacteremia, and pneumonia caused by pneumococcal disease can be fatal. 2. PPSV23 PPSV23 protects against 23 types of bacteria that cause pneumococcal disease. PPSV23 is recommended for:  All adults 65 years or older,  Anyone 2 years or older with certain medical conditions that can lead to an increased risk for pneumococcal disease. Most people need only one dose of PPSV23. A second dose of PPSV23, and another type of pneumococcal vaccine called PCV13, are recommended for certain high-risk groups. Your health care provider can give you more information. People 65 years or older should get a dose of PPSV23 even if they have already gotten one or more doses of the vaccine before they turned 65. 3. Talk with your health care provider Tell your vaccine provider if the person getting the vaccine:  Has had an allergic reaction after a previous dose of PPSV23, or has any severe, life-threatening allergies. In some cases, your health care provider may decide to postpone  PPSV23 vaccination to a future visit. People with minor illnesses, such as a cold, may be vaccinated. People who are moderately or severely ill should usually wait until they recover before getting PPSV23. Your health care provider can give you more information. 4. Risks of a vaccine reaction  Redness or pain where the shot is given, feeling tired, fever, or muscle aches can happen after PPSV23. People sometimes faint after medical procedures, including vaccination. Tell your provider if you feel dizzy or have vision changes or ringing in the ears. As with any medicine, there is a very remote chance of a vaccine causing a severe allergic reaction, other serious injury, or death. 5. What if there is a serious problem? An allergic reaction could occur after the vaccinated person leaves the clinic. If you see signs of a severe allergic reaction (hives, swelling of the face and throat, difficulty breathing, a fast heartbeat, dizziness, or weakness), call 9-1-1 and get the person to the nearest hospital. For other signs that concern you, call your health care provider. Adverse reactions should be reported to the Vaccine Adverse Event Reporting System (VAERS). Your health care provider will usually file this report, or you can do it yourself. Visit the VAERS website at www.vaers.hhs.gov or call 1-800-822-7967. VAERS is only for reporting reactions, and VAERS staff do not give medical advice. 6. How can I learn more?  Ask your health care provider.  Call your local or state health department.  Contact the Centers for Disease Control and Prevention (CDC): ? Call 1-800-232-4636 (1-800-CDC-INFO) or ? Visit CDC's website at www.cdc.gov/vaccines CDC Vaccine Information Statement PPSV23 Vaccine (01/15/2018) This information is not intended to replace advice given to you by your health care provider. Make sure you   discuss any questions you have with your health care provider. Document Revised: 06/24/2018  Document Reviewed: 10/15/2017 Elsevier Patient Education  2020 Elsevier Inc.  

## 2019-08-12 NOTE — Assessment & Plan Note (Signed)
Chronic, ongoing with A1C 6.6% today and urine ALB 30 with A:C <30 at last visit.  Recommend she reduce Levemir by 3 units every 3 days with goal of BS <130 in morning, if BS starts creeping upwards then maintain the Levemir dose she is taking at that time.  Continue Novolog and recommend she monitor BS 2 hours after meals + continue Victoza.  BMP today.  Continue to check BS at home daily and document.  Refuses CCM referral.  Return in 3 months.

## 2019-08-12 NOTE — Assessment & Plan Note (Signed)
Chronic, ongoing.  Continue current medication regimen and adjust as needed.  Lipid panel next visit, recent LDL 69.

## 2019-08-12 NOTE — Assessment & Plan Note (Signed)
Continue collaboration with Dr. Grayland Ormond at [facility].

## 2019-08-12 NOTE — Progress Notes (Signed)
BP 135/82   Pulse 93   Temp 98 F (36.7 C) (Oral)   Wt 157 lb 3.2 oz (71.3 kg)   LMP  (LMP Unknown)   SpO2 96%   BMI 31.75 kg/m    Subjective:    Patient ID: Kara Leach, female    DOB: Feb 15, 1951, 69 y.o.   MRN: [id number]  HPI: Kara Leach is a 69 y.o. female  Chief Complaint  Patient presents with  . Diabetes  . Hyperlipidemia  . Hypertension   DIABETES Last A1C 6.9% in February.  Continues on Novolog pre meal and Levemir + Victoza 1.2 MG daily. Has been walking a little more if not raining or snowing. Hypoglycemic episodes:no Polydipsia/polyuria: no Visual disturbance: no Chest pain: no Paresthesias: no Glucose Monitoring: yes  Accucheck frequency: BID  Fasting glucose: 90 this morning, averaging 105  Post prandial:  Evening: 148 last night  Before meals: Taking Insulin?: yes  Long acting insulin: Levemir 60 units  Short acting insulin: Novolog 16 units Blood Pressure Monitoring: daily Retinal Examination: Up To Date Foot Exam: Up to Date Pneumovax: Up to Date Influenza: Up to Date Aspirin: yes   HYPERTENSION / HYPERLIPIDEMIA Continues on Amlodipine, Enalapril, Metoprolol, and ASA + Simvastatin. Satisfied with current treatment? yes Duration of hypertension: chronic BP monitoring frequency: daily BP range:  average 130's over 70's at home BP medication side effects: no Duration of hyperlipidemia: chronic Cholesterol medication side effects: no Cholesterol supplements: none Medication compliance: good compliance Aspirin: yes Recent stressors: no Recurrent headaches: no Visual changes: no Palpitations: no Dyspnea: no Chest pain: no Lower extremity edema: no Dizzy/lightheaded: no   CHRONIC KIDNEY DISEASE With GFR ranging from 44-56 over past two years. Is scheduled to see kidney provider in June.  Last CRT 1.30 and GFR 43 in office in February. CKD status: stable Medications renally dose: yes Previous renal evaluation:  yes Pneumovax:  Up to Date Influenza Vaccine:  Up to Date   MGUS: Last saw Dr. Grayland Ormond in March 2020 with numbers remaining stable.  She reports they are going up, but very slowly.  Sees Dr. Grayland Ormond every 6 months.  DEPRESSION Continues on Sertraline 100 MG daily. Mood status: stable Satisfied with current treatment?: yes Symptom severity: mild  Duration of current treatment : chronic Side effects: no Medication compliance: good compliance Psychotherapy/counseling: none Depressed mood: no Anxious mood: no Anhedonia: no Significant weight loss or gain: no Insomnia: ocasional Fatigue: no Feelings of worthlessness or guilt: no Impaired concentration/indecisiveness: no Suicidal ideations: no Hopelessness: no Crying spells: no Depression screen [facility] 2/9 08/12/2019 05/15/2019 02/11/2019 02/04/2019 11/07/2018  Decreased Interest 0 0 1 0 1  Down, Depressed, Hopeless 1 1 1  0 1  PHQ - 2 Score 1 1 2  0 2  Altered sleeping 1 2 3  - 3  Tired, decreased energy 1 1 3  - 3  Change in appetite 0 0 1 - 0  Feeling bad or failure about yourself  0 0 2 - 0  Trouble concentrating 0 0 0 - 0  Moving slowly or fidgety/restless 0 0 0 - 0  Suicidal thoughts 0 0 0 - 0  PHQ-9 Score 3 4 11  - 8  Difficult doing work/chores Not difficult at all Not difficult at all - - Not difficult at all  Some recent data might be hidden    Relevant past medical, surgical, family and social history reviewed and updated as indicated. Interim medical history since our last visit reviewed. Allergies and medications reviewed  and updated.  Review of Systems  Constitutional: Negative for activity change, appetite change, diaphoresis, fatigue and fever.  Respiratory: Negative for cough, chest tightness and shortness of breath.   Cardiovascular: Negative for chest pain, palpitations and leg swelling.  Gastrointestinal: Negative.   Endocrine: Negative for cold intolerance, heat intolerance, polydipsia, polyphagia and  polyuria.  Neurological: Negative.   Psychiatric/Behavioral: Negative.     Per HPI unless specifically indicated above     Objective:    BP 135/82   Pulse 93   Temp 98 F (36.7 C) (Oral)   Wt 157 lb 3.2 oz (71.3 kg)   LMP  (LMP Unknown)   SpO2 96%   BMI 31.75 kg/m   Wt Readings from Last 3 Encounters:  08/12/19 157 lb 3.2 oz (71.3 kg)  02/11/19 156 lb (70.8 kg)  02/04/19 153 lb (69.4 kg)    Physical Exam Vitals and nursing note reviewed.  Constitutional:      General: She is awake. She is not in acute distress.    Appearance: She is well-developed. She is not ill-appearing.  HENT:     Head: Normocephalic.     Right Ear: Hearing normal.     Left Ear: Hearing normal.  Eyes:     General: Lids are normal.        Right eye: No discharge.        Left eye: No discharge.     Conjunctiva/sclera: Conjunctivae normal.     Pupils: Pupils are equal, round, and reactive to light.  Neck:     Thyroid: No thyromegaly.     Vascular: No carotid bruit.  Cardiovascular:     Rate and Rhythm: Normal rate and regular rhythm.     Heart sounds: Normal heart sounds. No murmur. No gallop.   Pulmonary:     Effort: Pulmonary effort is normal. No accessory muscle usage or respiratory distress.     Breath sounds: Normal breath sounds.  Abdominal:     General: Bowel sounds are normal.     Palpations: Abdomen is soft.     Tenderness: There is no abdominal tenderness.  Musculoskeletal:     Cervical back: Normal range of motion and neck supple.     Right lower leg: No edema.     Left lower leg: No edema.  Lymphadenopathy:     Cervical: No cervical adenopathy.  Skin:    General: Skin is warm and dry.  Neurological:     Mental Status: She is alert and oriented to person, place, and time.  Psychiatric:        Attention and Perception: Attention normal.        Mood and Affect: Mood normal.        Speech: Speech normal.        Behavior: Behavior normal. Behavior is cooperative.         Thought Content: Thought content normal.    Diabetic Foot Exam - Simple   No data filed     Results for orders placed or performed in visit on 05/15/19  Bayer DCA Hb A1c Waived  Result Value Ref Range   HB A1C (BAYER DCA - WAIVED) 6.9 <7.0 %  Microalbumin, Urine Waived  Result Value Ref Range   Microalb, Ur Waived 30 (H) 0 - 19 mg/L   Creatinine, Urine Waived 100 10 - 300 mg/dL   Microalb/Creat Ratio <30 <30 mg/g  Comprehensive metabolic panel  Result Value Ref Range   Glucose 140 (H) 65 - 99  mg/dL   BUN 20 8 - 27 mg/dL   Creatinine, Ser 1.30 (H) 0.57 - 1.00 mg/dL   GFR calc non Af Amer 43 (L) >59 mL/min/1.73   GFR calc Af Amer 49 (L) >59 mL/min/1.73   BUN/Creatinine Ratio 15 12 - 28   Sodium 143 134 - 144 mmol/L   Potassium 4.6 3.5 - 5.2 mmol/L   Chloride 104 96 - 106 mmol/L   CO2 21 20 - 29 mmol/L   Calcium 9.7 8.7 - 10.3 mg/dL   Total Protein 7.5 6.0 - 8.5 g/dL   Albumin 4.4 3.8 - 4.8 g/dL   Globulin, Total 3.1 1.5 - 4.5 g/dL   Albumin/Globulin Ratio 1.4 1.2 - 2.2   Bilirubin Total 0.3 0.0 - 1.2 mg/dL   Alkaline Phosphatase 114 39 - 117 IU/L   AST 116 (H) 0 - 40 IU/L   ALT 52 (H) 0 - 32 IU/L  Lipid Panel w/o Chol/HDL Ratio  Result Value Ref Range   Cholesterol, Total 144 100 - 199 mg/dL   Triglycerides 210 (H) 0 - 149 mg/dL   HDL 40 >39 mg/dL   VLDL Cholesterol Cal 35 5 - 40 mg/dL   LDL Chol Calc (NIH) 69 0 - 99 mg/dL  Vitamin B12  Result Value Ref Range   Vitamin B-12 476 232 - 1,245 pg/mL      Assessment & Plan:   Problem List Items Addressed This Visit      Cardiovascular and Mediastinum   Hypertension associated with diabetes ([place])    Chronic, ongoing with CKD 3.  BP at goal home and office.  Continue current medication regimen and adjust as needed.  Enalapril for kidney protection.  BMP today.      Relevant Medications   enalapril (VASOTEC) 2.5 MG tablet     Endocrine   Hyperlipidemia associated with type 2 diabetes mellitus (HCC)    Chronic,  ongoing.  Continue current medication regimen and adjust as needed.  Lipid panel next visit, recent LDL 69.      Relevant Medications   enalapril (VASOTEC) 2.5 MG tablet   CKD stage 3 due to type 2 diabetes mellitus (HCC)    Chronic, ongoing.  A1C today 6.6%.  Continue Enalapril for kidney protection and continue to renal dose medications as needed.  Upcoming visit with nephrology.      Relevant Medications   enalapril (VASOTEC) 2.5 MG tablet   Other Relevant Orders   Bayer DCA Hb A1c Waived   Basic metabolic panel   Type 2 diabetes mellitus with stage 3b chronic kidney disease, with long-term current use of insulin (HCC) - Primary    Chronic, ongoing with A1C 6.6% today and urine ALB 30 with A:C <30 at last visit.  Recommend she reduce Levemir by 3 units every 3 days with goal of BS <130 in morning, if BS starts creeping upwards then maintain the Levemir dose she is taking at that time.  Continue Novolog and recommend she monitor BS 2 hours after meals + continue Victoza.  BMP today.  Continue to check BS at home daily and document.  Refuses CCM referral.  Return in 3 months.      Relevant Medications   enalapril (VASOTEC) 2.5 MG tablet     Other   MGUS (monoclonal gammopathy of unknown significance)    Continue collaboration with Dr. Grayland Ormond at [facility].      Depression, major, single episode, in partial remission (HCC)    Chronic, stable.  Denies  SI/HI.  Continue current medication regimen, Sertraline 100 MG daily, and adjust as needed. Recommend meditation and relaxation techniques at home.      Relevant Medications   sertraline (ZOLOFT) 100 MG tablet    Other Visit Diagnoses    Need for pneumococcal vaccination       Relevant Orders   Pneumococcal polysaccharide vaccine 23-valent greater than or equal to 2yo subcutaneous/IM (Completed)       Follow up plan: Return in about 3 months (around 11/12/2019) for T2DM, HTN/HLD, MOOD, OSTEOPENIA, Vit D and B12.

## 2019-08-13 LAB — BASIC METABOLIC PANEL
BUN/Creatinine Ratio: 19 (ref 12–28)
BUN: 21 mg/dL (ref 8–27)
CO2: 22 mmol/L (ref 20–29)
Calcium: 10 mg/dL (ref 8.7–10.3)
Chloride: 103 mmol/L (ref 96–106)
Creatinine, Ser: 1.12 mg/dL — ABNORMAL HIGH (ref 0.57–1.00)
GFR calc Af Amer: 59 mL/min/{1.73_m2} — ABNORMAL LOW (ref >59)
GFR calc non Af Amer: 51 mL/min/{1.73_m2} — ABNORMAL LOW (ref >59)
Glucose: 87 mg/dL (ref 65–99)
Potassium: 4.5 mmol/L (ref 3.5–5.2)
Sodium: 141 mmol/L (ref 134–144)

## 2019-08-13 NOTE — Progress Notes (Signed)
Good morning, please let Kara Leach know her labs have returned and kidney function is remaining stable with chronic kidney disease stage 3 and no decline.  This is great news.  No changes needed.  Have a great weekend!!

## 2019-08-30 ENCOUNTER — Other Ambulatory Visit: Payer: Self-pay | Admitting: Nurse Practitioner

## 2019-09-08 ENCOUNTER — Ambulatory Visit (INDEPENDENT_AMBULATORY_CARE_PROVIDER_SITE_OTHER): Payer: Medicare HMO | Admitting: Nurse Practitioner

## 2019-09-08 ENCOUNTER — Other Ambulatory Visit: Payer: Self-pay

## 2019-09-08 ENCOUNTER — Encounter: Payer: Self-pay | Admitting: Nurse Practitioner

## 2019-09-08 VITALS — BP 110/70 | HR 86 | Temp 98.2°F

## 2019-09-08 DIAGNOSIS — R8281 Pyuria: Secondary | ICD-10-CM | POA: Diagnosis not present

## 2019-09-08 MED ORDER — AMOXICILLIN-POT CLAVULANATE 875-125 MG PO TABS: 1.0000 | ORAL_TABLET | Freq: Two times a day (BID) | ORAL | 0 refills | Status: AC

## 2019-09-08 NOTE — Patient Instructions (Signed)
Urinary Tract Infection, Adult A urinary tract infection (UTI) is an infection of any part of the urinary tract. The urinary tract includes:  The kidneys.  The ureters.  The bladder.  The urethra. These organs make, store, and get rid of pee (urine) in the body. What are the causes? This is caused by germs (bacteria) in your genital area. These germs grow and cause swelling (inflammation) of your urinary tract. What increases the risk? You are more likely to develop this condition if:  You have a small, thin tube (catheter) to drain pee.  You cannot control when you pee or poop (incontinence).  You are female, and: ? You use these methods to prevent pregnancy:  A medicine that kills sperm (spermicide).  A device that blocks sperm (diaphragm). ? You have low levels of a female hormone (estrogen). ? You are pregnant.  You have genes that add to your risk.  You are sexually active.  You take antibiotic medicines.  You have trouble peeing because of: ? A prostate that is bigger than normal, if you are female. ? A blockage in the part of your body that drains pee from the bladder (urethra). ? A kidney stone. ? A nerve condition that affects your bladder (neurogenic bladder). ? Not getting enough to drink. ? Not peeing often enough.  You have other conditions, such as: ? Diabetes. ? A weak disease-fighting system (immune system). ? Sickle cell disease. ? Gout. ? Injury of the spine. What are the signs or symptoms? Symptoms of this condition include:  Needing to pee right away (urgently).  Peeing often.  Peeing small amounts often.  Pain or burning when peeing.  Blood in the pee.  Pee that smells bad or not like normal.  Trouble peeing.  Pee that is cloudy.  Fluid coming from the vagina, if you are female.  Pain in the belly or lower back. Other symptoms include:  Throwing up (vomiting).  No urge to eat.  Feeling mixed up (confused).  Being tired  and grouchy (irritable).  A fever.  Watery poop (diarrhea). How is this treated? This condition may be treated with:  Antibiotic medicine.  Other medicines.  Drinking enough water. Follow these instructions at home:  Medicines  Take over-the-counter and prescription medicines only as told by your doctor.  If you were prescribed an antibiotic medicine, take it as told by your doctor. Do not stop taking it even if you start to feel better. General instructions  Make sure you: ? Pee until your bladder is empty. ? Do not hold pee for a long time. ? Empty your bladder after sex. ? Wipe from front to back after pooping if you are a female. Use each tissue one time when you wipe.  Drink enough fluid to keep your pee pale yellow.  Keep all follow-up visits as told by your doctor. This is important. Contact a doctor if:  You do not get better after 1-2 days.  Your symptoms go away and then come back. Get help right away if:  You have very bad back pain.  You have very bad pain in your lower belly.  You have a fever.  You are sick to your stomach (nauseous).  You are throwing up. Summary  A urinary tract infection (UTI) is an infection of any part of the urinary tract.  This condition is caused by germs in your genital area.  There are many risk factors for a UTI. These include having a small, thin   tube to drain pee and not being able to control when you pee or poop.  Treatment includes antibiotic medicines for germs.  Drink enough fluid to keep your pee pale yellow. This information is not intended to replace advice given to you by your health care provider. Make sure you discuss any questions you have with your health care provider. Document Revised: 02/20/2018 Document Reviewed: 09/12/2017 Elsevier Patient Education  2020 Elsevier Inc.  

## 2019-09-08 NOTE — Progress Notes (Signed)
BP 110/70   Pulse 86 Comment: apical  Temp 98.2 F (36.8 C) (Oral)   LMP  (LMP Unknown)   SpO2 96%    Subjective:    Patient ID: Kara Leach, female    DOB: Nov 17, 1950, 69 y.o.   MRN: [id number]  HPI: Kara Leach is a 69 y.o. female  Chief Complaint  Patient presents with  . Urinary Tract Infection    pt states she has been having urinary urgency, pain, burning and pressure since Thursday night    URINARY SYMPTOMS Started with symptoms Thursday evening -- urgency, pain, burning, and pressure. Dysuria: burning Urinary frequency: no Urgency: yes Small volume voids: yes Symptom severity: yes Urinary incontinence: no Foul odor: yes Hematuria: no Abdominal pain: no Back pain: yes Suprapubic pain/pressure: yes Flank pain: no Fever:  no Vomiting: no Relief with cranberry juice: taking, but no relief Relief with pyridium: not taking Status: stable Previous urinary tract infection: yes -- not for some time Recurrent urinary tract infection: no Sexual activity: No sexually active History of sexually transmitted disease: no Treatments attempted: cranberry and increasing fluids   Relevant past medical, surgical, family and social history reviewed and updated as indicated. Interim medical history since our last visit reviewed. Allergies and medications reviewed and updated.  Review of Systems  Constitutional: Negative for activity change, appetite change, diaphoresis, fatigue and fever.  Respiratory: Negative for cough, chest tightness and shortness of breath.   Cardiovascular: Negative for chest pain, palpitations and leg swelling.  Gastrointestinal: Positive for abdominal pain. Negative for nausea and vomiting.  Genitourinary: Positive for decreased urine volume, dysuria and urgency. Negative for frequency, hematuria and vaginal discharge.  Neurological: Negative.   Psychiatric/Behavioral: Negative.     Per HPI unless specifically indicated above       Objective:    BP 110/70   Pulse 86 Comment: apical  Temp 98.2 F (36.8 C) (Oral)   LMP  (LMP Unknown)   SpO2 96%   Wt Readings from Last 3 Encounters:  08/12/19 157 lb 3.2 oz (71.3 kg)  02/11/19 156 lb (70.8 kg)  02/04/19 153 lb (69.4 kg)    Physical Exam Vitals and nursing note reviewed.  Constitutional:      General: She is awake. She is not in acute distress.    Appearance: She is well-developed and well-groomed. She is not ill-appearing.  HENT:     Head: Normocephalic.     Right Ear: Hearing normal.     Left Ear: Hearing normal.  Eyes:     General: Lids are normal.        Right eye: No discharge.        Left eye: No discharge.     Conjunctiva/sclera: Conjunctivae normal.     Pupils: Pupils are equal, round, and reactive to light.  Neck:     Vascular: No carotid bruit.  Cardiovascular:     Rate and Rhythm: Normal rate and regular rhythm.     Heart sounds: Normal heart sounds. No murmur heard.  No gallop.   Pulmonary:     Effort: Pulmonary effort is normal. No accessory muscle usage or respiratory distress.     Breath sounds: Normal breath sounds.  Abdominal:     General: Bowel sounds are normal.     Palpations: Abdomen is soft.     Tenderness: There is abdominal tenderness in the suprapubic area. There is no right CVA tenderness or left CVA tenderness.  Musculoskeletal:     Cervical back:  Normal range of motion and neck supple.     Right lower leg: No edema.     Left lower leg: No edema.  Skin:    General: Skin is warm and dry.  Neurological:     Mental Status: She is alert and oriented to person, place, and time.  Psychiatric:        Attention and Perception: Attention normal.        Mood and Affect: Mood normal.        Speech: Speech normal.        Behavior: Behavior normal. Behavior is cooperative.        Thought Content: Thought content normal.     Results for orders placed or performed in visit on 08/12/19  Bayer DCA Hb A1c Waived  Result Value  Ref Range   HB A1C (BAYER DCA - WAIVED) 6.6 <2.2 %  Basic metabolic panel  Result Value Ref Range   Glucose 87 65 - 99 mg/dL   BUN 21 8 - 27 mg/dL   Creatinine, Ser 1.12 (H) 0.57 - 1.00 mg/dL   GFR calc non Af Amer 51 (L) >59 mL/min/1.73   GFR calc Af Amer 59 (L) >59 mL/min/1.73   BUN/Creatinine Ratio 19 12 - 28   Sodium 141 134 - 144 mmol/L   Potassium 4.5 3.5 - 5.2 mmol/L   Chloride 103 96 - 106 mmol/L   CO2 22 20 - 29 mmol/L   Calcium 10.0 8.7 - 10.3 mg/dL      Assessment & Plan:   Problem List Items Addressed This Visit      Other   Pyuria - Primary    Acute since Thursday.  UA today noting 1+ ketone, 2+ LEUK, trace protein, moderate bacteria, WBC 11-30.  Will send in Augmentin and adjust regimen as needed based on culture results, discussed this with patient and provided education.  Recommend she increase fluid intake at home and make take Azo as needed for comfort.  Return to office for worsening or ongoing symptoms.      Relevant Orders   UA/M w/rflx Culture, Routine       Follow up plan: Return if symptoms worsen or fail to improve.

## 2019-09-08 NOTE — Assessment & Plan Note (Signed)
Acute since Thursday.  UA today noting 1+ ketone, 2+ LEUK, trace protein, moderate bacteria, WBC 11-30.  Will send in Augmentin and adjust regimen as needed based on culture results, discussed this with patient and provided education.  Recommend she increase fluid intake at home and make take Azo as needed for comfort.  Return to office for worsening or ongoing symptoms.

## 2019-09-13 LAB — MICROSCOPIC EXAMINATION: RBC, Urine: NONE SEEN /hpf (ref 0–2)

## 2019-09-13 LAB — UA/M W/RFLX CULTURE, ROUTINE
Bilirubin, UA: NEGATIVE
Glucose, UA: NEGATIVE
Nitrite, UA: NEGATIVE
RBC, UA: NEGATIVE
Specific Gravity, UA: 1.02 (ref 1.005–1.030)
Urobilinogen, Ur: 0.2 mg/dL (ref 0.2–1.0)
pH, UA: 5.5 (ref 5.0–7.5)

## 2019-09-13 LAB — URINE CULTURE, REFLEX

## 2019-09-30 ENCOUNTER — Telehealth: Payer: Self-pay | Admitting: Nurse Practitioner

## 2019-09-30 NOTE — Chronic Care Management (AMB) (Signed)
  Chronic Care Management   Outreach Note  09/30/2019 Name: Kara Leach MRN: [id number] DOB: 1950-10-24  Kara Leach is a 69 y.o. year old female who is a primary care patient of Cannady, Barbaraann Faster, NP. I reached out to [name] by phone today in response to a referral sent by [name]'s health plan.     An unsuccessful telephone outreach was attempted today. The patient was referred to the case management team for assistance with care management and care coordination.   Follow Up Plan: A HIPPA compliant phone message was left for the patient providing contact information and requesting a return call.  The care management team will reach out to the patient again over the next 7 days.  If patient returns call to provider office, please advise to call [facility] [place], [place], [place], [place] [address] Direct Dial: [phone] [email or web address] Website: [email or web address]

## 2019-10-01 DIAGNOSIS — R69 Illness, unspecified: Secondary | ICD-10-CM | POA: Diagnosis not present

## 2019-10-05 NOTE — Chronic Care Management (AMB) (Signed)
  Chronic Care Management   Note  10/05/2019 Name: Kara Leach MRN: [id number] DOB: 1950/04/02  PAULETT KAUFHOLD is a 69 y.o. year old female who is a primary care patient of Cannady, Barbaraann Faster, NP. I reached out to [name] by phone today in response to a referral sent by Ms. Lisabeth Devoid [place]'s health plan.     Ms. Winchel was given information about Chronic Care Management services today including:  1. CCM service includes personalized support from designated clinical staff supervised by her physician, including individualized plan of care and coordination with other care providers 2. 24/7 contact phone numbers for assistance for urgent and routine care needs. 3. Service will only be billed when office clinical staff spend 20 minutes or more in a month to coordinate care. 4. Only one practitioner may furnish and bill the service in a calendar month. 5. The patient may stop CCM services at any time (effective at the end of the month) by phone call to the office staff. 6. The patient will be responsible for cost sharing (co-pay) of up to 20% of the service fee (after annual deductible is met).  Patient agreed to services and verbal consent obtained.   Follow up plan: Telephone appointment with care management team member scheduled for:10/15/2019  Noreene Larsson, [place], [place], [place] [address] Direct Dial: [phone] [email or web address] Website: [email or web address]

## 2019-10-07 ENCOUNTER — Other Ambulatory Visit: Payer: Self-pay | Admitting: Nurse Practitioner

## 2019-10-07 DIAGNOSIS — E1159 Type 2 diabetes mellitus with other circulatory complications: Secondary | ICD-10-CM

## 2019-10-07 DIAGNOSIS — E1122 Type 2 diabetes mellitus with diabetic chronic kidney disease: Secondary | ICD-10-CM

## 2019-10-07 DIAGNOSIS — N183 Chronic kidney disease, stage 3 unspecified: Secondary | ICD-10-CM

## 2019-10-07 DIAGNOSIS — I152 Hypertension secondary to endocrine disorders: Secondary | ICD-10-CM

## 2019-10-07 DIAGNOSIS — N1832 Chronic kidney disease, stage 3b: Secondary | ICD-10-CM

## 2019-10-12 NOTE — Chronic Care Management (AMB) (Signed)
Chronic Care Management Pharmacy  Name: Kara Leach  MRN: [id number] DOB: [birth year]   Chief Complaint/ HPI  Kara Leach,  69 y.o. , female presents for their Follow-Up CCM visit with the clinical pharmacist via telephone due to COVID-19 Pandemic.  PCP : Kara Lick, NP Patient Care Team: Kara Lick, NP as PCP - General (Nurse Practitioner) Robert Bellow, MD (General Surgery) Anthonette Legato, MD (Internal Medicine) Leanor Kail, MD (Inactive) (Orthopedic Surgery) Lloyd Huger, MD as Consulting Physician (Oncology) De Hollingshead, [facility] as Pharmacist (Pharmacist) Vladimir Faster, [facility] (Pharmacist)  Their chronic conditions include: Hypertension, Hyperlipidemia, Diabetes, GERD, Chronic Kidney Disease, Osteopenia, Gout, Allergic Rhinitis and Vitamin D/B12 deficiency   Office Visits: 09/08/19- [name], NP- UTI Klebsiella S all except amp, treated Augmentin 08/12/19 - [name], NP - blood work, CKD stable, begin reducing Levemir dose by 3 u q 3 days to goal of FBS <130   Consult Visit: 04/23/19- Dr. Bary Castilla, Surgery- not available 04/13/19- Dr. George Ina, Ophthalmology-note not available  Allergies  Allergen Reactions  . Strawberry Extract Swelling    Lips swell and rash/whelps over body Difficulty breathing  . Ibuprofen Other (See Comments)    "ringing in ears", dizziness Also, Advil / Aleve / Motrin    Medications: Outpatient Encounter Medications as of 10/15/2019  Medication Sig  . acetaminophen (TYLENOL ARTHRITIS PAIN) 650 MG CR tablet Take 650 mg by mouth every 8 (eight) hours as needed for pain.  Marland Kitchen allopurinol (ZYLOPRIM) 100 MG tablet TAKE 1 TABLET BY MOUTH EVERY DAY (Patient taking differently: Take 50 mg by mouth daily. )  . amLODipine (NORVASC) 5 MG tablet TAKE 1 TABLET BY MOUTH EVERY DAY  . aspirin EC 81 MG tablet Take 81 mg by mouth daily.  . BD PEN NEEDLE NANO U/F 32G X 4 MM MISC USE EVERY MORNING  . Calcium  Carb-Cholecalciferol (CALCIUM 600 + D PO) Take 1 tablet by mouth 2 (two) times daily. 1000 IU VITAMIN D  . enalapril (VASOTEC) 2.5 MG tablet Take 1 tablet (2.5 mg total) by mouth daily.  . Famotidine (PEPCID PO) Take 10 mg by mouth 2 (two) times daily.   . fluticasone (FLONASE) 50 MCG/ACT nasal spray PLACE 2 SPRAYS INTO BOTH NOSTRILS DAILY AS NEEDED FOR ALLERGIES.  Marland Kitchen LEVEMIR FLEXTOUCH 100 UNIT/ML FlexPen INJECT 60 UNITS INTO THE SKIN DAILY AT 10 PM. (Patient taking differently: INJECT 50 UNITS INTO THE SKIN DAILY AT 10 PM.)  . liraglutide (VICTOZA) 18 MG/3ML SOPN ADMINISTER 1.2MG UNDER THE SKIN DAILY (Patient taking differently: ADMINISTER 1.2MG UNDER THE SKIN EVERY NIGHT)  . metoprolol succinate (TOPROL-XL) 25 MG 24 hr tablet TAKE 1 TABLET BY MOUTH EVERY DAY  . NOVOLOG FLEXPEN 100 UNIT/ML FlexPen INJECT 15U UNDER THE SKIN 3 TIMES DAILY WITH MEALS  . ONETOUCH ULTRA test strip USE TO CHECK BLOOD SUGAR DAILY  . sertraline (ZOLOFT) 100 MG tablet Take 1 tablet (100 mg total) by mouth every morning.  . simvastatin (ZOCOR) 40 MG tablet TAKE 1 TABLET BY MOUTH EVERYDAY AT BEDTIME  . Specialty Vitamins Products (VITA-RX DIABETIC VITAMIN) CAPS Take 1 packet by mouth daily. Diabetic Vitamins - 6 or 7 in 1 pack   No facility-administered encounter medications on file as of 10/15/2019.    Goals Addressed            This Visit's Progress   . Pharmacy Chronic Care Management       CARE PLAN ENTRY (see longitudinal plan of care  for additional care plan information)  Current Barriers:  . Chronic Disease Management support, education, and care coordination needs related to Hypertension, Hyperlipidemia, and Diabetes   Hypertension BP Readings from Last 3 Encounters:  09/08/19 110/70  08/12/19 135/82  05/15/19 104/71   . Pharmacist Clinical Goal(s): o Over the next 90 days, patient will work with PharmD and providers to maintain BP goal <130/80 . Current regimen:  . Amlodipine 5 mg qd . Enalapril  2.5 mg qd . Metoprolol succinate 25 mg qd . Interventions: o In depth medication review o Provided diet and exercise counseling . Patient self care activities - Over the next 90 days, patient will: o Check BP 2-3 times wekly, document, and provide at future appointments o Ensure daily salt intake < 2300 mg/day  Hyperlipidemia Lab Results  Component Value Date/Time   LDLCALC 69 05/15/2019 09:00 AM   LDLCALC SEE COMMENT 11/12/2013 07:45 AM   . Pharmacist Clinical Goal(s): o Over the next 90 days, patient will work with PharmD and providers to maintain LDL goal < 70 . Current regimen:  o Simvastatin 40 mg every evening . Interventions: o Comprehensive medication review o Updated medication list in the EMR o Provided dietary and lifestyle counseling  . Patient self care activities - Over the next 90 days, patient will: o Maintain regular exercise regimen with goal of 30 minutes/day 5 days weekly of moderate intensity exercise. Incorporate weight-bearing and muscle strengthening 3 times weekly. Great job with your hand and ankle weights! Keep it up! o Continue heart healthy/diabetic diet with reduced saturated fat, fried foods and sugary snacks.   Diabetes Lab Results  Component Value Date/Time   HGBA1C 6.6 08/12/2019 08:35 AM   HGBA1C 6.9 05/15/2019 08:58 AM   HGBA1C 13.4 (H) 11/12/2013 12:21 PM   . Pharmacist Clinical Goal(s): o Over the next 90 days, patient will work with PharmD and providers to maintain A1c goal <7%, maximize medication regimen and prevent hypoglycemia. . Current regimen:   Levemir 50 units Every evening at 10 pm  liraglutide 1.3m daily  novolog 15 units subq three times daily with meals . Interventions: o Comprehensive med review o Updated medications in EMR o Provided diet and exercise counseling o Collaborate with provider to adjust Novolog regimen and prevent afternoon hypoglycemia symptoms. . Patient self care activities - Over the next 90 days,  patient will: o Check blood sugar 3-4 times daily, document, and provide at future appointments o Contact provider with any episodes of hypoglycemia o Increase exercise to goal of 30 minutes, 5 days weekly o Avoid long periods of sitting  o Obtain new glucometer and strips   Medication management . Pharmacist Clinical Goal(s): o Over the next 90 days, patient will work with PharmD and providers to achieve optimal medication adherence . [facility]. Interventions o Comprehensive medication review performed. o Continue current medication management strategy . Patient self care activities - Over the next 90 days, patient will: o Focus on medication adherence by utilizing pill box o Take medications as prescribed o Report any questions or concerns to PharmD and/or provider(s) o [facility] ([facility])  Initial goal documentation        Diabetes with stage 3b CKD   A1c goal <7%  Recent Relevant Labs: Lab Results  Component Value Date/Time   HGBA1C 6.6 08/12/2019 08:35 AM   HGBA1C 6.9 05/15/2019 08:58 AM   HGBA1C 13.4 (H) 11/12/2013 12:21  PM   MICROALBUR 30 (H) 05/15/2019 08:58 AM   MICROALBUR 10 08/06/2018 08:16 AM    Last diabetic Eye exam:  Lab Results  Component Value Date/Time   HMDIABEYEEXA No Retinopathy 04/13/2019 12:00 AM    Last diabetic Foot exam:  Lab Results  Component Value Date/Time   HMDIABFOOTEX normal 08/12/2014 12:00 AM    CMP Latest Ref Rng & Units 08/12/2019 05/15/2019 02/11/2019  Glucose 65 - 99 mg/dL 87 140(H) 101(H)  BUN 8 - 27 mg/dL 21 20 18   Creatinine 0.57 - 1.00 mg/dL 1.12(H) 1.30(H) 1.28(H)  Sodium 134 - 144 mmol/L 141 143 141  Potassium 3.5 - 5.2 mmol/L 4.5 4.6 4.5  Chloride 96 - 106 mmol/L 103 104 103  CO2 20 - 29 mmol/L 22 21 23   Calcium 8.7 - 10.3 mg/dL 10.0 9.7 10.0  Total Protein 6.0 - 8.5 g/dL - 7.5 7.5  Total Bilirubin 0.0 - 1.2 mg/dL - 0.3 0.4  Alkaline  Phos 39 - 117 IU/L - 114 118(H)  AST 0 - 40 IU/L - 116(H) 45(H)  ALT 0 - 32 IU/L - 52(H) 25      Checking BG: 3x per Day  Recent FBG Readings: 112-118 Recent 2hr PP BG readings:  Low 80's most days after lunch   Patient has failed these meds in past: Januvia, metformin ( GI issues) Patient is currently controlled on the following medications:  Insulin detemir 50 u q 10 pm  liraglutide 1.59m qd  novolog 15 u tid meals  We discussed: diet and exercise extensively and how to recognize and treat signs of hypoglycemia. She reports feeling shaky and hungry most days around 3 pm. This is releived with a snack.She eats lunch daily around noon and this is her smallest daily meal.  She also reports weight loss of ~13 lbs since around January. She reports cutting out fried foods , limiting starches and drinking water "all day long" since her bout with dehydration/ARF. She reports occasionally getting error messages on her glucometer and would like a new one. She currently uses One-Touch Verio. She reports occasional episode of "pins and needles" in her feet. She attributes this to "years of standing on concrete floors all day" working at [employer] correlates episodes to days after spending a lot of time on her feet. We discussed elevating her legs and checking her feet daily.  Plan  Recommend reducing lunchtime Novolog dose by 2 units daily to a goal of no symptoms and postprandial lunch BG <130. Consider changing Levemir to TTyler Aasfor better 24 hour coverage and working toward maximizing Victoza dose to 1.8 mg and potentially eliminating Novolog if possible. Request CMA [facility]  Hypertension associated with diabetes/ CKD stage 3   BP goal is:  <140/90  Office blood pressures are  BP Readings from Last 3 Encounters:  09/08/19 110/70  08/12/19 135/82  05/15/19 104/71   Her most recent EGFR = 529mmin   Patient has failed these meds in the past:  None Patient is currently controlled on the following medications:  . Amlodipine 5 mg qd . Enalapril 2.5 mg qd . Metoprolol succinate 25 mg qd  We discussed diet and exercise extensively. Taking metoprolol at night.   Plan  Recommend continue current medications . Consider changing administration time to pm for better nocturnal BP control.     Hyperlipidemia   LDL goal < 70  Lipid Panel     Component Value Date/Time  CHOL 144 05/15/2019 0900   CHOL 152 08/06/2018 0816   CHOL 242 (H) 11/12/2013 0745   TRIG 210 (H) 05/15/2019 0900   TRIG 343 (H) 08/06/2018 0816   TRIG 1,562 (H) 11/12/2013 0745   HDL 40 05/15/2019 0900   HDL 22 (L) 11/12/2013 0745   LDLCALC 69 05/15/2019 0900   LDLCALC SEE COMMENT 11/12/2013 0745    Hepatic Function Latest Ref Rng & Units 05/15/2019 02/11/2019 11/07/2018  Total Protein 6.0 - 8.5 g/dL 7.5 7.5 7.6  Albumin 3.8 - 4.8 g/dL 4.4 4.7 4.5  AST 0 - 40 IU/L 116(H) 45(H) 70(H)  ALT 0 - 32 IU/L 52(H) 25 42(H)  Alk Phosphatase 39 - 117 IU/L 114 118(H) 119(H)  Total Bilirubin 0.0 - 1.2 mg/dL 0.3 0.4 0.2     The 10-year ASCVD risk score [name], et al., 2013) is: 11.2%   Values used to calculate the score:     Age: 89 years     Sex: Female     Is Non-Hispanic African American: No     Diabetic: Yes     Tobacco smoker: No     Systolic Blood Pressure: 675 mmHg     Is BP treated: Yes     HDL Cholesterol: 40 mg/dL     Total Cholesterol: 144 mg/dL   Patient has failed these meds in past: None Patient is currently controlled on the following medications:  . Simvastatin 40 mg qhs  We discussed:  diet and exercise extensively. Mrs. Scheiderer has cut out fried food from her diet and limits starches. She has a flower garden which she enjoys working in and walks dogs when it isn't too hot. She has ankle and hand weight which she uses 2-3 times per week.  Plan  Recommend continue current medications and recheck lipid panel at next visit.  Osteopenia  / Osteoporosis   Last DEXA Scan: 10/09/18  T-Score femoral neck: -2.0  T-Score lumbar spine: -2.1  10-year probability of major osteoporotic fracture: 16.9  10-year probability of hip fracture: 2.5  Vit D, 25-Hydroxy  Date Value Ref Range Status  02/11/2019 35.2 30.0 - 100.0 ng/mL Final    Comment:    Vitamin D deficiency has been defined by the Institute of Medicine and an Endocrine Society practice guideline as a level of serum 25-OH vitamin D less than 20 ng/mL (1,2). The Endocrine Society went on to further define vitamin D insufficiency as a level between 21 and 29 ng/mL (2). 1. IOM (Institute of Medicine). 2010. Dietary reference    intakes for calcium and D. [place]: The    [employer]. 2. Holick MF, Binkley [place], Bischoff-Ferrari HA, et al.    Evaluation, treatment, and prevention of vitamin D    deficiency: an Endocrine Society clinical practice    guideline. JCEM. 2011 Jul; 96(7):1911-30.      Patient is not a candidate for pharmacologic treatment at this time.  Patient has failed these meds in past:None Patient is currently controlled on the following medications:  . Calcium carbonate 652m  + D 1000u  bid  We discussed:   Continued weight-bearing and strengthening exercised for building and maintaining bone density. Plan  Continue current medications.     Gout   Uric Acid  Date Value Ref Range Status  02/11/2019 3.5 2.5 - 7.1 mg/dL Final    Comment:        **Effective February 23, 2019 Uric Acid reference**  interval will be changing to:              Age                Female          Female           0 - 30 days        3.9 - 7.8      2.7 - 6.5           1 -  6 months      1.9 - 8.1      2.0 - 6.6           7 - 11 months      2.0 - 6.5      2.1 - 5.7           1 -  5 years       2.2 - 5.5      2.0 - 5.0           6 - 11 years       2.2 - 5.5      2.4 - 5.6               12 years       2.9 - 7.0      2.9 - 6.1          13 - 17 years        3.9 - 7.7      2.9 - 6.1          18 - 50 years       3.8 - 8.4      2.6 - 6.2          51 - 70 years       3.8 - 8.4      3.0 - 7.2              >70 years       3.8 - 8.4      3.1 - 7.9            Therapeutic target for gout patients: <6.0   08/06/2018 3.9 2.5 - 7.1 mg/dL Final    Comment:               Therapeutic target for gout patients: <6.0  11/08/2014 4.0 2.5 - 7.1 mg/dL Final    Comment:               Therapeutic target for gout patients: <6.0     Patient has failed these meds in past: None Patient is currently controlled on the following medications:  . Allopurinol 50 mg qd  We discussed:  She reports good control on reduced dose of allopurinol and has been without flares.  Plan  Continue current medications  Depression / Anxiety   PHQ9 Score:  PHQ9 SCORE ONLY 08/12/2019 05/15/2019 02/11/2019  PHQ-9 Total Score 3 4 11    GAD7 Score: GAD 7 : Generalized Anxiety Score 02/11/2019  Nervous, Anxious, on Edge 3  Control/stop worrying 3  Worry too much - different things 3  Trouble relaxing 3  Restless 2  Easily annoyed or irritable 2  Afraid - awful might happen 2  Total GAD 7 Score 18  Anxiety Difficulty Somewhat difficult    Patient has failed these meds in past: None Patient is currently controlled on the following medications:  . Sertraline 100 mg qd  We discussed:  She reports sertraline working well for her. She lives with family and stays in touch with her five younger sisters via facetime and text messaging. She loves to travel and reports being disappointed at missing a family beach trip for two summers due to [place]. We discussed the positive effects of regular exercise on mood.  Plan  Continue current medications  GERD    B12 level 476 05/15/19 Magnesium 1.9 08/06/18  Patient has failed these meds in past: ranitidine Patient is currently controlled on the following medications:   Famotidine 10 mg bid   We discussed:  Food choices and  lifestyle modifiers. She has a good understanding of her triggers and avoids those foods when possible. She notes her two grandchildren living with her often want pizza which causes her symptoms.  Plan  Continue current medications  Health maintenance    Acetaminophen 650 mg CR q8h prn pain . Fluticasone nasal 2 sprays each nostril daily as needed . Diabetic vitamin pack daily  Patient is satisfied with this regimen.    Vaccines   Reviewed and discussed patient's vaccination history.    Immunization History  Administered Date(s) Administered  . Fluad Quad(high Dose 65+) 02/11/2019  . Influenza,inj,Quad PF,6+ Mos 11/01/2016, 11/13/2017  . Influenza-Unspecified 12/20/2014, 12/28/2015  . PFIZER SARS-COV-2 Vaccination 05/13/2019, 06/03/2019  . Pneumococcal Conjugate-13 02/12/2018  . Pneumococcal Polysaccharide-23 02/14/2009, 08/12/2019  . Td 06/24/2008  . Tdap 01/08/2011  . Zoster 12/28/2015  . Zoster Recombinat (Shingrix) 10/13/2018    Plan  Recommended patient receive second dose of Shingrix if not received.   Medication Management   [facility] Pt endorses 95 % compliance  We discussed: [facility] Estock [facility] and enjoys getting out of the house and going to the pharmacy.  Plan  Continue current medication management strategy    Follow up: 3 month phone visit  Junita Push. Kenton Kingfisher PharmD, [facility] [phone]

## 2019-10-14 ENCOUNTER — Telehealth: Payer: Self-pay | Admitting: Pharmacist

## 2019-10-15 ENCOUNTER — Ambulatory Visit: Payer: Medicare HMO

## 2019-10-15 DIAGNOSIS — I152 Hypertension secondary to endocrine disorders: Secondary | ICD-10-CM

## 2019-10-15 DIAGNOSIS — E1169 Type 2 diabetes mellitus with other specified complication: Secondary | ICD-10-CM

## 2019-10-15 DIAGNOSIS — Z794 Long term (current) use of insulin: Secondary | ICD-10-CM

## 2019-10-15 DIAGNOSIS — E1159 Type 2 diabetes mellitus with other circulatory complications: Secondary | ICD-10-CM

## 2019-10-15 DIAGNOSIS — N1832 Chronic kidney disease, stage 3b: Secondary | ICD-10-CM

## 2019-10-15 NOTE — Telephone Encounter (Signed)
Opened in error

## 2019-10-16 DIAGNOSIS — R69 Illness, unspecified: Secondary | ICD-10-CM | POA: Diagnosis not present

## 2019-10-16 NOTE — Patient Instructions (Signed)
Visit Information  Kara Leach,  It was a pleasure speaking with you today! Thank you for letting me be a part of your care team. Please call with any questions or concerns. [facility] new One-Touch Verio meter and strips. This should be free through your insurance. Keep up the great work! I'm excited to hear about your plan for visiting [facility].     Goals Addressed            This Visit's Progress   . Pharmacy Chronic Care Management       CARE PLAN ENTRY (see longitudinal plan of care for additional care plan information)  Current Barriers:  . Chronic Disease Management support, education, and care coordination needs related to Hypertension, Hyperlipidemia, and Diabetes   Hypertension BP Readings from Last 3 Encounters:  09/08/19 110/70  08/12/19 135/82  05/15/19 104/71   . Pharmacist Clinical Goal(s): o Over the next 90 days, patient will work with PharmD and providers to maintain BP goal <130/80 . Current regimen:  . Amlodipine 5 mg qd . Enalapril 2.5 mg qd . Metoprolol succinate 25 mg qd . Interventions: o In depth medication review o Provided diet and exercise counseling . Patient self care activities - Over the next 90 days, patient will: o Check BP 2-3 times wekly, document, and provide at future appointments o Ensure daily salt intake < 2300 mg/day  Hyperlipidemia Lab Results  Component Value Date/Time   LDLCALC 69 05/15/2019 09:00 AM   LDLCALC SEE COMMENT 11/12/2013 07:45 AM   . Pharmacist Clinical Goal(s): o Over the next 90 days, patient will work with PharmD and providers to maintain LDL goal < 70 . Current regimen:  o Simvastatin 40 mg every evening . Interventions: o Comprehensive medication review o Updated medication list in the EMR o Provided dietary and lifestyle counseling  . Patient self care activities - Over the next 90 days, patient will: o Maintain regular exercise regimen with goal of 30 minutes/day 5  days weekly of moderate intensity exercise. Incorporate weight-bearing and muscle strengthening 3 times weekly. Great job with your hand and ankle weights! Keep it up! o Continue heart healthy/diabetic diet with reduced saturated fat, fried foods and sugary snacks.   Diabetes Lab Results  Component Value Date/Time   HGBA1C 6.6 08/12/2019 08:35 AM   HGBA1C 6.9 05/15/2019 08:58 AM   HGBA1C 13.4 (H) 11/12/2013 12:21 PM   . Pharmacist Clinical Goal(s): o Over the next 90 days, patient will work with PharmD and providers to maintain A1c goal <7%, maximize medication regimen and prevent hypoglycemia. . Current regimen:   Levemir 50 units Every evening at 10 pm  liraglutide 1.2mg  daily  novolog 15 units subq three times daily with meals . Interventions: o Comprehensive med review o Updated medications in EMR o Provided diet and exercise counseling o Collaborate with provider to adjust Novolog regimen and prevent afternoon hypoglycemia symptoms. . Patient self care activities - Over the next 90 days, patient will: o Check blood sugar 3-4 times daily, document, and provide at future appointments o Contact provider with any episodes of hypoglycemia o Increase exercise to goal of 30 minutes, 5 days weekly o Avoid long periods of sitting  o Obtain new glucometer and strips   Medication management . Pharmacist Clinical Goal(s): o Over the next 90 days, patient will work with PharmD and providers to achieve optimal medication adherence . Current pharmacy: [facility] . Interventions o Comprehensive medication  review performed. o Continue current medication management strategy . Patient self care activities - Over the next 90 days, patient will: o Focus on medication adherence by utilizing pill box o Take medications as prescribed o Report any questions or concerns to PharmD and/or provider(s) o [facility] strips [facility] ([facility])  Initial goal  documentation        Ms. Hanigan was given information about Chronic Care Management services today including:  1. CCM service includes personalized support from designated clinical staff supervised by her physician, including individualized plan of care and coordination with other care providers 2. 24/7 contact phone numbers for assistance for urgent and routine care needs. 3. Standard insurance, coinsurance, copays and deductibles apply for chronic care management only during months in which we provide at least 20 minutes of these services. Most insurances cover these services at 100%, however patients may be responsible for any copay, coinsurance and/or deductible if applicable. This service may help you avoid the need for more expensive face-to-face services. 4. Only one practitioner may furnish and bill the service in a calendar month. 5. The patient may stop CCM services at any time (effective at the end of the month) by phone call to the office staff.  Patient agreed to services and verbal consent obtained.   The patient verbalized understanding of instructions provided today and agreed to receive a mailed copy of patient instruction and/or educational materials. Telephone follow up appointment with pharmacy team member scheduled for:  Junita Push. Kenton Kingfisher PharmD, BCPS Clinical Pharmacist [phone]  Diabetes Mellitus and Exercise Exercising regularly is important for your overall health, especially when you have diabetes (diabetes mellitus). Exercising is not only about losing weight. It has many other health benefits, such as increasing muscle strength and bone density and reducing body fat and stress. This leads to improved fitness, flexibility, and endurance, all of which result in better overall health. Exercise has additional benefits for people with diabetes, including:  Reducing appetite.  Helping to lower and control blood glucose.  Lowering blood pressure.  Helping to  control amounts of fatty substances (lipids) in the blood, such as cholesterol and triglycerides.  Helping the body to respond better to insulin (improving insulin sensitivity).  Reducing how much insulin the body needs.  Decreasing the risk for heart disease by: ? Lowering cholesterol and triglyceride levels. ? Increasing the levels of good cholesterol. ? Lowering blood glucose levels. What is my activity plan? Your health care provider or certified diabetes educator can help you make a plan for the type and frequency of exercise (activity plan) that works for you. Make sure that you:  Do at least 150 minutes of moderate-intensity or vigorous-intensity exercise each week. This could be brisk walking, biking, or water aerobics. ? Do stretching and strength exercises, such as yoga or weightlifting, at least 2 times a week. ? Spread out your activity over at least 3 days of the week.  Get some form of physical activity every day. ? Do not go more than 2 days in a row without some kind of physical activity. ? Avoid being inactive for more than 30 minutes at a time. Take frequent breaks to walk or stretch.  Choose a type of exercise or activity that you enjoy, and set realistic goals.  Start slowly, and gradually increase the intensity of your exercise over time. What do I need to know about managing my diabetes?   Check your blood glucose before and  after exercising. ? If your blood glucose is 240 mg/dL (13.3 mmol/L) or higher before you exercise, check your urine for ketones. If you have ketones in your urine, do not exercise until your blood glucose returns to normal. ? If your blood glucose is 100 mg/dL (5.6 mmol/L) or lower, eat a snack containing 15-20 grams of carbohydrate. Check your blood glucose 15 minutes after the snack to make sure that your level is above 100 mg/dL (5.6 mmol/L) before you start your exercise.  Know the symptoms of low blood glucose (hypoglycemia) and how to  treat it. Your risk for hypoglycemia increases during and after exercise. Common symptoms of hypoglycemia can include: ? Hunger. ? Anxiety. ? Sweating and feeling clammy. ? Confusion. ? Dizziness or feeling light-headed. ? Increased heart rate or palpitations. ? Blurry vision. ? Tingling or numbness around the mouth, lips, or tongue. ? Tremors or shakes. ? Irritability.  Keep a rapid-acting carbohydrate snack available before, during, and after exercise to help prevent or treat hypoglycemia.  Avoid injecting insulin into areas of the body that are going to be exercised. For example, avoid injecting insulin into: ? The arms, when playing tennis. ? The legs, when jogging.  Keep records of your exercise habits. Doing this can help you and your health care provider adjust your diabetes management plan as needed. Write down: ? Food that you eat before and after you exercise. ? Blood glucose levels before and after you exercise. ? The type and amount of exercise you have done. ? When your insulin is expected to peak, if you use insulin. Avoid exercising at times when your insulin is peaking.  When you start a new exercise or activity, work with your health care provider to make sure the activity is safe for you, and to adjust your insulin, medicines, or food intake as needed.  Drink plenty of water while you exercise to prevent dehydration or heat stroke. Drink enough fluid to keep your urine clear or pale yellow. Summary  Exercising regularly is important for your overall health, especially when you have diabetes (diabetes mellitus).  Exercising has many health benefits, such as increasing muscle strength and bone density and reducing body fat and stress.  Your health care provider or certified diabetes educator can help you make a plan for the type and frequency of exercise (activity plan) that works for you.  When you start a new exercise or activity, work with your health care  provider to make sure the activity is safe for you, and to adjust your insulin, medicines, or food intake as needed. This information is not intended to replace advice given to you by your health care provider. Make sure you discuss any questions you have with your health care provider. Document Revised: 09/27/2016 Document Reviewed: 08/15/2015 Elsevier Patient Education  [place] DASH stands for "Dietary Approaches to Stop Hypertension." The DASH eating plan is a healthy eating plan that has been shown to reduce high blood pressure (hypertension). It may also reduce your risk for type 2 diabetes, heart disease, and stroke. The DASH eating plan may also help with weight loss. What are tips for following this plan?  General guidelines  Avoid eating more than 2,300 mg (milligrams) of salt (sodium) a day. If you have hypertension, you may need to reduce your sodium intake to 1,500 mg a day.  Limit alcohol intake to no more than 1 drink a day for nonpregnant women and 2 drinks a day  for men. One drink equals 12 oz of beer, 5 oz of wine, or 1 oz of hard liquor.  Work with your health care provider to maintain a healthy body weight or to lose weight. Ask what an ideal weight is for you.  Get at least 30 minutes of exercise that causes your heart to beat faster (aerobic exercise) most days of the week. Activities may include walking, swimming, or biking.  Work with your health care provider or diet and nutrition specialist (dietitian) to adjust your eating plan to your individual calorie needs. Reading food labels   Check food labels for the amount of sodium per serving. Choose foods with less than 5 percent of the Daily Value of sodium. Generally, foods with less than 300 mg of sodium per serving fit into this eating plan.  To find whole grains, look for the word "whole" as the first word in the ingredient list. Shopping  Buy products labeled as "low-sodium" or "no  salt added."  Buy fresh foods. Avoid canned foods and premade or frozen meals. Cooking  Avoid adding salt when cooking. Use salt-free seasonings or herbs instead of table salt or sea salt. Check with your health care provider or pharmacist before using salt substitutes.  Do not fry foods. Cook foods using healthy methods such as baking, boiling, grilling, and broiling instead.  Cook with heart-healthy oils, such as olive, canola, soybean, or sunflower oil. Meal planning  Eat a balanced diet that includes: ? 5 or more servings of fruits and vegetables each day. At each meal, try to fill half of your plate with fruits and vegetables. ? Up to 6-8 servings of whole grains each day. ? Less than 6 oz of lean meat, poultry, or fish each day. A 3-oz serving of meat is about the same size as a deck of cards. One egg equals 1 oz. ? 2 servings of low-fat dairy each day. ? A serving of nuts, seeds, or beans 5 times each week. ? Heart-healthy fats. Healthy fats called Omega-3 fatty acids are found in foods such as flaxseeds and coldwater fish, like sardines, salmon, and mackerel.  Limit how much you eat of the following: ? Canned or prepackaged foods. ? Food that is high in trans fat, such as fried foods. ? Food that is high in saturated fat, such as fatty meat. ? Sweets, desserts, sugary drinks, and other foods with added sugar. ? Full-fat dairy products.  Do not salt foods before eating.  Try to eat at least 2 vegetarian meals each week.  Eat more home-cooked food and less restaurant, buffet, and fast food.  When eating at a restaurant, ask that your food be prepared with less salt or no salt, if possible. What foods are recommended? The items listed may not be a complete list. Talk with your dietitian about what dietary choices are best for you. Grains Whole-grain or whole-wheat bread. Whole-grain or whole-wheat pasta. Brown rice. [place] Morrow. Bulgur. Whole-grain and low-sodium  cereals. Pita bread. Low-fat, low-sodium crackers. Whole-wheat flour tortillas. Vegetables Fresh or frozen vegetables (raw, steamed, roasted, or grilled). Low-sodium or reduced-sodium tomato and vegetable juice. Low-sodium or reduced-sodium tomato sauce and tomato paste. Low-sodium or reduced-sodium canned vegetables. Fruits All fresh, dried, or frozen fruit. Canned fruit in natural juice (without added sugar). Meat and other protein foods Skinless chicken or [place]. Ground chicken or [place]. Pork with fat trimmed off. Fish and seafood. Egg whites. Dried beans, peas, or lentils. Unsalted nuts, nut butters, and seeds. Unsalted  canned beans. Lean cuts of beef with fat trimmed off. Low-sodium, lean deli meat. Dairy Low-fat (1%) or fat-free (skim) milk. Fat-free, low-fat, or reduced-fat cheeses. Nonfat, low-sodium ricotta or cottage cheese. Low-fat or nonfat yogurt. Low-fat, low-sodium cheese. Fats and oils Soft margarine without trans fats. Vegetable oil. Low-fat, reduced-fat, or light mayonnaise and salad dressings (reduced-sodium). Canola, safflower, olive, soybean, and sunflower oils. Avocado. Seasoning and other foods Herbs. Spices. Seasoning mixes without salt. Unsalted popcorn and pretzels. Fat-free sweets. What foods are not recommended? The items listed may not be a complete list. Talk with your dietitian about what dietary choices are best for you. Grains Baked goods made with fat, such as croissants, muffins, or some breads. Dry pasta or rice meal packs. Vegetables Creamed or fried vegetables. Vegetables in a cheese sauce. Regular canned vegetables (not low-sodium or reduced-sodium). Regular canned tomato sauce and paste (not low-sodium or reduced-sodium). Regular tomato and vegetable juice (not low-sodium or reduced-sodium). Angie Fava. Olives. Fruits Canned fruit in a light or heavy syrup. Fried fruit. Fruit in cream or butter sauce. Meat and other protein foods Fatty cuts of meat. Ribs.  Fried meat. Berniece Salines. Sausage. Bologna and other processed lunch meats. Salami. Fatback. Hotdogs. Bratwurst. Salted nuts and seeds. Canned beans with added salt. Canned or smoked fish. Whole eggs or egg yolks. Chicken or [place] with skin. Dairy Whole or 2% milk, cream, and half-and-half. Whole or full-fat cream cheese. Whole-fat or sweetened yogurt. Full-fat cheese. Nondairy creamers. Whipped toppings. Processed cheese and cheese spreads. Fats and oils Butter. Stick margarine. Lard. Shortening. Ghee. Bacon fat. Tropical oils, such as coconut, palm kernel, or palm oil. Seasoning and other foods Salted popcorn and pretzels. Onion salt, garlic salt, seasoned salt, table salt, and sea salt. Worcestershire sauce. Tartar sauce. Barbecue sauce. Teriyaki sauce. Soy sauce, including reduced-sodium. Steak sauce. Canned and packaged gravies. Fish sauce. Oyster sauce. Cocktail sauce. Horseradish that you find on the shelf. Ketchup. Mustard. Meat flavorings and tenderizers. Bouillon cubes. Hot sauce and Tabasco sauce. Premade or packaged marinades. Premade or packaged taco seasonings. Relishes. Regular salad dressings. Where to find more information:  National Heart, Lung, and [place]: https://wilson-eaton.com/  American Heart Association: www.heart.org Summary  The DASH eating plan is a healthy eating plan that has been shown to reduce high blood pressure (hypertension). It may also reduce your risk for type 2 diabetes, heart disease, and stroke.  With the DASH eating plan, you should limit salt (sodium) intake to 2,300 mg a day. If you have hypertension, you may need to reduce your sodium intake to 1,500 mg a day.  When on the DASH eating plan, aim to eat more fresh fruits and vegetables, whole grains, lean proteins, low-fat dairy, and heart-healthy fats.  Work with your health care provider or diet and nutrition specialist (dietitian) to adjust your eating plan to your individual calorie needs. This  information is not intended to replace advice given to you by your health care provider. Make sure you discuss any questions you have with your health care provider. Document Revised: 02/15/2017 Document Reviewed: 02/27/2016 Elsevier Patient Education  2020 [employer].

## 2019-10-20 ENCOUNTER — Telehealth: Payer: Self-pay

## 2019-10-20 NOTE — Chronic Care Management (AMB) (Signed)
  Care Management   Note  10/20/2019 Name: Kara Leach MRN: [id number] DOB: [birth year]  SILA SARSFIELD is a 69 y.o. year old female who is a primary care patient of Venita Lick, NP and is actively engaged with the care management team. I reached out to [name] by phone today to assist with re-scheduling a follow up visit with the Pharmacist  Follow up plan: Unsuccessful telephone outreach attempt made. A HIPPA compliant phone message was left for the patient providing contact information and requesting a return call.  The care management team will reach out to the patient again over the next 7 days.  If patient returns call to provider office, please advise to call [facility]  at [place], [place], [place], [place] [address] Direct Dial: [phone] [email or web address] Website: [email or web address]

## 2019-10-21 ENCOUNTER — Other Ambulatory Visit: Payer: Self-pay | Admitting: Nurse Practitioner

## 2019-10-29 NOTE — Chronic Care Management (AMB) (Signed)
  Care Management   Note  10/29/2019 Name: Kara Leach MRN: [id number] DOB: Jan 26, 1951  Kara Leach is a 69 y.o. year old female who is a primary care patient of Venita Lick, NP and is actively engaged with the care management team. I reached out to [name] by phone today to assist with scheduling a follow up visit with the Pharmacist  Follow up plan: Telephone appointment with care management team member scheduled for:01/18/2020  .Noreene Larsson, [place], [place], [place] [address] Direct Dial: [phone] [email or web address] Website: [email or web address]

## 2019-10-29 NOTE — Progress Notes (Signed)
Pt has been r/s for 01/18/2020

## 2019-11-20 ENCOUNTER — Other Ambulatory Visit: Payer: Self-pay

## 2019-11-20 ENCOUNTER — Ambulatory Visit (INDEPENDENT_AMBULATORY_CARE_PROVIDER_SITE_OTHER): Payer: Medicare HMO | Admitting: Nurse Practitioner

## 2019-11-20 ENCOUNTER — Encounter: Payer: Self-pay | Admitting: Nurse Practitioner

## 2019-11-20 ENCOUNTER — Ambulatory Visit: Payer: Medicare HMO | Admitting: Nurse Practitioner

## 2019-11-20 VITALS — BP 114/74 | HR 76 | Temp 98.3°F | Wt 153.0 lb

## 2019-11-20 DIAGNOSIS — N1832 Chronic kidney disease, stage 3b: Secondary | ICD-10-CM

## 2019-11-20 DIAGNOSIS — E1159 Type 2 diabetes mellitus with other circulatory complications: Secondary | ICD-10-CM | POA: Diagnosis not present

## 2019-11-20 DIAGNOSIS — N183 Chronic kidney disease, stage 3 unspecified: Secondary | ICD-10-CM

## 2019-11-20 DIAGNOSIS — Z794 Long term (current) use of insulin: Secondary | ICD-10-CM

## 2019-11-20 DIAGNOSIS — Z23 Encounter for immunization: Secondary | ICD-10-CM | POA: Diagnosis not present

## 2019-11-20 DIAGNOSIS — I152 Hypertension secondary to endocrine disorders: Secondary | ICD-10-CM

## 2019-11-20 DIAGNOSIS — E785 Hyperlipidemia, unspecified: Secondary | ICD-10-CM | POA: Diagnosis not present

## 2019-11-20 DIAGNOSIS — R69 Illness, unspecified: Secondary | ICD-10-CM | POA: Diagnosis not present

## 2019-11-20 DIAGNOSIS — F324 Major depressive disorder, single episode, in partial remission: Secondary | ICD-10-CM

## 2019-11-20 DIAGNOSIS — I10 Essential (primary) hypertension: Secondary | ICD-10-CM

## 2019-11-20 DIAGNOSIS — E1169 Type 2 diabetes mellitus with other specified complication: Secondary | ICD-10-CM | POA: Diagnosis not present

## 2019-11-20 DIAGNOSIS — D472 Monoclonal gammopathy: Secondary | ICD-10-CM | POA: Diagnosis not present

## 2019-11-20 DIAGNOSIS — E1122 Type 2 diabetes mellitus with diabetic chronic kidney disease: Secondary | ICD-10-CM

## 2019-11-20 DIAGNOSIS — E1121 Type 2 diabetes mellitus with diabetic nephropathy: Secondary | ICD-10-CM | POA: Diagnosis not present

## 2019-11-20 DIAGNOSIS — E538 Deficiency of other specified B group vitamins: Secondary | ICD-10-CM | POA: Diagnosis not present

## 2019-11-20 LAB — BAYER DCA HB A1C WAIVED: HB A1C (BAYER DCA - WAIVED): 6.7 % (ref <7.0)

## 2019-11-20 NOTE — Assessment & Plan Note (Signed)
Chronic, ongoing with CKD 3.  BP at goal home and office.  Continue current medication regimen and adjust as needed.  Focus on DASH diet.  Enalapril for kidney protection.  BMP today.  Return in 3 months.

## 2019-11-20 NOTE — Assessment & Plan Note (Signed)
Low normal level previous visit, will recheck this today and recommend continue supplement.

## 2019-11-20 NOTE — Assessment & Plan Note (Signed)
Chronic, stable.  Denies SI/HI.  Continue current medication regimen, Sertraline 100 MG daily, and adjust as needed. Recommend meditation and relaxation techniques at home. 

## 2019-11-20 NOTE — Patient Instructions (Signed)

## 2019-11-20 NOTE — Assessment & Plan Note (Signed)
Continue collaboration with Dr. Finnegan at [facility]. 

## 2019-11-20 NOTE — Assessment & Plan Note (Signed)
Chronic, ongoing.  A1C last visit 6.6%, recheck today.  Continue Enalapril for kidney protection and continue to renal dose medications as needed.  Recommend she schedule with nephrology.

## 2019-11-20 NOTE — Assessment & Plan Note (Signed)
Chronic, ongoing.  Continue current medication regimen and adjust as needed.  Lipid panel today, recent LDL 69. 

## 2019-11-20 NOTE — Progress Notes (Signed)
BP 114/74    Pulse 76    Temp 98.3 F (36.8 C) (Oral)    Wt 153 lb (69.4 kg)    LMP  (LMP Unknown)    SpO2 97%    BMI 30.90 kg/m    Subjective:    Patient ID: Kara Leach, female    DOB: 01/01/1951, 69 y.o.   MRN: [id number]  HPI: Kara Leach is a 70 y.o. female  Chief Complaint  Patient presents with   Diabetes   Hypertension   Hyperlipidemia   Depression   DIABETES Last A1C 6.6% in May.  Continues on Novolog pre meal and Levemir + Victoza 1.2 MG daily.  Was recommended last visit to reduce Levemir by 3 units every 3 days if BS at goal in morning <130. Hypoglycemic episodes:no Polydipsia/polyuria: no Visual disturbance: no Chest pain: no Paresthesias: no Glucose Monitoring: yes  Accucheck frequency: BID  Fasting glucose: 77 this morning, averaging 109-112 (124)  Post prandial:  Evening: average 130-140  Before meals: Taking Insulin?: yes  Long acting insulin: Levemir 50 units  Short acting insulin: Novolog 16 units Blood Pressure Monitoring: daily Retinal Examination: Up To Date Foot Exam: Up to Date Pneumovax: Up to Date Influenza: Up to Date Aspirin: yes   HYPERTENSION / HYPERLIPIDEMIA Continues on Amlodipine, Enalapril, Metoprolol, and ASA + Simvastatin. Satisfied with current treatment? yes Duration of hypertension: chronic BP monitoring frequency: daily BP range:  average 130's over 70's at home BP medication side effects: no Duration of hyperlipidemia: chronic Cholesterol medication side effects: no Cholesterol supplements: none Medication compliance: good compliance Aspirin: yes Recent stressors: no Recurrent headaches: no Visual changes: no Palpitations: no Dyspnea: no Chest pain: no Lower extremity edema: no Dizzy/lightheaded: no   CHRONIC KIDNEY DISEASE With GFR ranging from 44-56 over past two years, last labs GFR 51 and CRT 1.12. Has not schedule with nephrology as of yet.   CKD status: stable Medications renally dose:  yes Previous renal evaluation: yes Pneumovax:  Up to Date Influenza Vaccine:  Up to Date   MGUS: Last saw Dr. Grayland Ormond in November 2020 with numbers remaining stable. She reports they are going up, but very slowly.  Sees Dr. Grayland Ormond every 6 months.   DEPRESSION Continues on Sertraline 100 MG daily.  She endorses winter and fall are difficult months.   Mood status: stable Satisfied with current treatment?: yes Symptom severity: mild  Duration of current treatment : chronic Side effects: no Medication compliance: good compliance Psychotherapy/counseling: none Depressed mood: no Anxious mood: no Anhedonia: no Significant weight loss or gain: no Insomnia: ocasional Fatigue: no Feelings of worthlessness or guilt: no Impaired concentration/indecisiveness: no Suicidal ideations: no Hopelessness: no Crying spells: no Depression screen [facility] 2/9 11/20/2019 08/12/2019 05/15/2019 02/11/2019 02/04/2019  Decreased Interest 2 0 0 1 0  Down, Depressed, Hopeless 2 1 1 1  0  PHQ - 2 Score 4 1 1 2  0  Altered sleeping 3 1 2 3  -  Tired, decreased energy 1 1 1 3  -  Change in appetite 1 0 0 1 -  Feeling bad or failure about yourself  2 0 0 2 -  Trouble concentrating 0 0 0 0 -  Moving slowly or fidgety/restless 0 0 0 0 -  Suicidal thoughts 0 0 0 0 -  PHQ-9 Score 11 3 4 11  -  Difficult doing work/chores Not difficult at all Not difficult at all Not difficult at all - -  Some recent data might be hidden  Relevant past medical, surgical, family and social history reviewed and updated as indicated. Interim medical history since our last visit reviewed. Allergies and medications reviewed and updated.  Review of Systems  Constitutional: Negative for activity change, appetite change, diaphoresis, fatigue and fever.  Respiratory: Negative for cough, chest tightness and shortness of breath.   Cardiovascular: Negative for chest pain, palpitations and leg swelling.  Gastrointestinal: Negative.    Endocrine: Negative for cold intolerance, heat intolerance, polydipsia, polyphagia and polyuria.  Neurological: Negative.   Psychiatric/Behavioral: Negative.     Per HPI unless specifically indicated above     Objective:    BP 114/74    Pulse 76    Temp 98.3 F (36.8 C) (Oral)    Wt 153 lb (69.4 kg)    LMP  (LMP Unknown)    SpO2 97%    BMI 30.90 kg/m   Wt Readings from Last 3 Encounters:  11/20/19 153 lb (69.4 kg)  08/12/19 157 lb 3.2 oz (71.3 kg)  02/11/19 156 lb (70.8 kg)    Physical Exam Vitals and nursing note reviewed.  Constitutional:      General: She is awake. She is not in acute distress.    Appearance: She is well-developed. She is not ill-appearing.  HENT:     Head: Normocephalic.     Right Ear: Hearing normal.     Left Ear: Hearing normal.  Eyes:     General: Lids are normal.        Right eye: No discharge.        Left eye: No discharge.     Conjunctiva/sclera: Conjunctivae normal.     Pupils: Pupils are equal, round, and reactive to light.  Neck:     Thyroid: No thyromegaly.     Vascular: No carotid bruit.  Cardiovascular:     Rate and Rhythm: Normal rate and regular rhythm.     Heart sounds: Normal heart sounds. No murmur heard.  No gallop.   Pulmonary:     Effort: Pulmonary effort is normal. No accessory muscle usage or respiratory distress.     Breath sounds: Normal breath sounds.  Abdominal:     General: Bowel sounds are normal.     Palpations: Abdomen is soft.     Tenderness: There is no abdominal tenderness.  Musculoskeletal:     Cervical back: Normal range of motion and neck supple.     Right lower leg: No edema.     Left lower leg: No edema.  Lymphadenopathy:     Cervical: No cervical adenopathy.  Skin:    General: Skin is warm and dry.  Neurological:     Mental Status: She is alert and oriented to person, place, and time.  Psychiatric:        Attention and Perception: Attention normal.        Mood and Affect: Mood normal.         Speech: Speech normal.        Behavior: Behavior normal. Behavior is cooperative.        Thought Content: Thought content normal.    Diabetic Foot Exam - Simple   No data filed     Results for orders placed or performed in visit on 09/08/19  Microscopic Examination   Urine  Result Value Ref Range   WBC, UA 11-30 (A) 0 - 5 /hpf   RBC None seen 0 - 2 /hpf   Epithelial Cells (non renal) 0-10 0 - 10 /hpf   Bacteria, UA  Moderate (A) None seen/Few  Urine Culture, Reflex   Urine  Result Value Ref Range   Urine Culture, Routine Final report (A)    Organism ID, Bacteria Klebsiella pneumoniae (A)    Antimicrobial Susceptibility Comment   UA/M w/rflx Culture, Routine   Specimen: Urine   Urine  Result Value Ref Range   Specific Gravity, UA 1.020 1.005 - 1.030   pH, UA 5.5 5.0 - 7.5   Color, UA Yellow Yellow   Appearance Ur Hazy (A) Clear   Leukocytes,UA 2+ (A) Negative   Protein,UA Trace (A) Negative/Trace   Glucose, UA Negative Negative   Ketones, UA 1+ (A) Negative   RBC, UA Negative Negative   Bilirubin, UA Negative Negative   Urobilinogen, Ur 0.2 0.2 - 1.0 mg/dL   Nitrite, UA Negative Negative   Microscopic Examination See below:    Urinalysis Reflex Comment       Assessment & Plan:   Problem List Items Addressed This Visit      Cardiovascular and Mediastinum   Hypertension associated with diabetes (HCC)    Chronic, ongoing with CKD 3.  BP at goal home and office.  Continue current medication regimen and adjust as needed.  Focus on DASH diet.  Enalapril for kidney protection.  BMP today.  Return in 3 months.      Relevant Orders   Bayer DCA Hb A1c Waived     Endocrine   Hyperlipidemia associated with type 2 diabetes mellitus (HCC)    Chronic, ongoing.  Continue current medication regimen and adjust as needed.  Lipid panel today, recent LDL 69.      Relevant Orders   Bayer DCA Hb A1c Waived   Lipid Panel w/o Chol/HDL Ratio   CKD stage 3 due to type 2 diabetes  mellitus (HCC)    Chronic, ongoing.  A1C last visit 6.6%, recheck today.  Continue Enalapril for kidney protection and continue to renal dose medications as needed.  Recommend she schedule with nephrology.      Type 2 diabetes mellitus with stage 3b chronic kidney disease, with long-term current use of insulin (HCC) - Primary    Chronic, ongoing with A1C 6.6% and urine ALB 30 with A:C <30 at last visit -- rehcheck A1C today.  Recommend she reduce Levemir by 3 units every 3 days with goal of BS <130 in morning, if BS starts creeping upwards then maintain the Levemir dose she is taking at that time.  Will consider change to [place] and maximizing Victoza dependent on A1C today.  Continue Novolog and recommend she monitor BS 2 hours after meals + continue Victoza.  BMP today.  Continue to check BS at home daily and document.  Return in 3 months.      Relevant Orders   Basic metabolic panel   Bayer DCA Hb A1c Waived     Other   MGUS (monoclonal gammopathy of unknown significance)    Continue collaboration with Dr. Grayland [facility]rm[facility] at [facility].      Depression, major, single episode, in partial remission (HCC)    Chronic, stable.  Denies SI/HI.  Continue current medication regimen, Sertraline 100 MG daily, and adjust as needed. Recommend meditation and relaxation techniques at home.      B12 deficiency    Low normal level previous visit, will recheck this today and recommend continue supplement.      Relevant Orders   Vitamin B12    Other Visit Diagnoses    Flu vaccine need  Relevant Orders   Flu Vaccine QUAD High Dose(Fluad) (Completed)       Follow up plan: Return in about 3 months (around 02/19/2020) for Annual physical.

## 2019-11-20 NOTE — Assessment & Plan Note (Signed)
Chronic, ongoing with A1C 6.6% and urine ALB 30 with A:C <30 at last visit -- rehcheck A1C today.  Recommend she reduce Levemir by 3 units every 3 days with goal of BS <130 in morning, if BS starts creeping upwards then maintain the Levemir dose she is taking at that time.  Will consider change to [place] and maximizing Victoza dependent on A1C today.  Continue Novolog and recommend she monitor BS 2 hours after meals + continue Victoza.  BMP today.  Continue to check BS at home daily and document.  Return in 3 months.

## 2019-11-21 LAB — BASIC METABOLIC PANEL
BUN/Creatinine Ratio: 18 (ref 12–28)
BUN: 19 mg/dL (ref 8–27)
CO2: 24 mmol/L (ref 20–29)
Calcium: 10 mg/dL (ref 8.7–10.3)
Chloride: 103 mmol/L (ref 96–106)
Creatinine, Ser: 1.06 mg/dL — ABNORMAL HIGH (ref 0.57–1.00)
GFR calc Af Amer: 63 mL/min/{1.73_m2} (ref >59)
GFR calc non Af Amer: 55 mL/min/{1.73_m2} — ABNORMAL LOW (ref >59)
Glucose: 90 mg/dL (ref 65–99)
Potassium: 4.5 mmol/L (ref 3.5–5.2)
Sodium: 141 mmol/L (ref 134–144)

## 2019-11-21 LAB — LIPID PANEL W/O CHOL/HDL RATIO
Cholesterol, Total: 135 mg/dL (ref 100–199)
HDL: 42 mg/dL (ref >39)
LDL Chol Calc (NIH): 63 mg/dL (ref 0–99)
Triglycerides: 178 mg/dL — ABNORMAL HIGH (ref 0–149)
VLDL Cholesterol Cal: 30 mg/dL (ref 5–40)

## 2019-11-21 LAB — VITAMIN B12: Vitamin B-12: 651 pg/mL (ref 232–1245)

## 2019-11-23 NOTE — Progress Notes (Signed)
Please let Kara Leach know her labs have returned.  Continues to show some mild kidney disease with no decline, continue your daily Enalapril for kidney protection.  We will continue to monitor this.  Cholesterol levels are at goal.  B12 level has improved, continue daily Vitamin B12.  Any questions? Keep being awesome!!  Thank you for allowing me to participate in your care. Kindest regards, Pawan Knechtel

## 2019-12-23 DIAGNOSIS — R69 Illness, unspecified: Secondary | ICD-10-CM | POA: Diagnosis not present

## 2020-01-18 ENCOUNTER — Ambulatory Visit: Payer: Medicare HMO | Admitting: Pharmacist

## 2020-01-18 DIAGNOSIS — Z794 Long term (current) use of insulin: Secondary | ICD-10-CM

## 2020-01-18 DIAGNOSIS — N1832 Chronic kidney disease, stage 3b: Secondary | ICD-10-CM

## 2020-01-18 DIAGNOSIS — I152 Hypertension secondary to endocrine disorders: Secondary | ICD-10-CM

## 2020-01-18 DIAGNOSIS — E1159 Type 2 diabetes mellitus with other circulatory complications: Secondary | ICD-10-CM

## 2020-01-18 NOTE — Chronic Care Management (AMB) (Signed)
Chronic Care Management Pharmacy  Name: Kara Leach  MRN: [id number] DOB: [birth year]   Chief Complaint/ HPI  [name],  69 y.o. , female presents for their Follow-Up CCM visit with the clinical pharmacist via telephone.  PCP : Venita Lick, NP Patient Care Team: Venita Lick, NP as PCP - General (Nurse Practitioner) Robert Bellow, MD (General Surgery) Anthonette Legato, MD (Internal Medicine) Leanor Kail, MD (Inactive) (Orthopedic Surgery) Lloyd Huger, MD as Consulting Physician (Oncology) Vladimir Faster, [facility] (Pharmacist)  Their chronic conditions include: Hypertension, Hyperlipidemia, Diabetes, GERD, Chronic Kidney Disease, Osteopenia, Gout, Allergic Rhinitis and Vitamin D/B12 deficiency   Office Visits: 11/20/19- [name], NP- bloodword, recommend decreasing Levemir  3 units q 3 d for BS <130  Consult Visit: 04/23/19- Dr. Bary Castilla, Surgery- not available 04/13/19- Dr. George Ina, Ophthalmology-note not available  Allergies  Allergen Reactions  . Strawberry Extract Swelling    Lips swell and rash/whelps over body Difficulty breathing  . Ibuprofen Other (See Comments)    "ringing in ears", dizziness Also, Advil / Aleve / Motrin    Medications: Outpatient Encounter Medications as of 01/18/2020  Medication Sig  . allopurinol (ZYLOPRIM) 100 MG tablet TAKE 1 TABLET BY MOUTH EVERY DAY (Patient taking differently: Take 50 mg by mouth daily. )  . amLODipine (NORVASC) 5 MG tablet TAKE 1 TABLET BY MOUTH EVERY DAY  . aspirin EC 81 MG tablet Take 81 mg by mouth daily.  . BD PEN NEEDLE NANO U/F 32G X 4 MM MISC USE EVERY MORNING  . Blood Glucose Monitoring Suppl (ONETOUCH VERIO REFLECT) w/Device KIT USE TO CHECK BLOOD SUGAR DAILY  . Calcium Carb-Cholecalciferol (CALCIUM 600 + D PO) Take 1 tablet by mouth 2 (two) times daily. 1000 IU VITAMIN D  . enalapril (VASOTEC) 2.5 MG tablet Take 1 tablet (2.5 mg total) by mouth daily.  . fluticasone  (FLONASE) 50 MCG/ACT nasal spray PLACE 2 SPRAYS INTO BOTH NOSTRILS DAILY AS NEEDED FOR ALLERGIES.  Marland Kitchen Lancets (ONETOUCH ULTRASOFT) lancets USE TO CHECK BLOOD SUGAR DAILY  . LEVEMIR FLEXTOUCH 100 UNIT/ML FlexPen INJECT 60 UNITS INTO THE SKIN DAILY AT 10 PM. (Patient taking differently: 50 Units. INJECT 60 UNITS INTO THE SKIN DAILY AT 10 PM.)  . liraglutide (VICTOZA) 18 MG/3ML SOPN ADMINISTER 1.2MG UNDER THE SKIN DAILY (Patient taking differently: ADMINISTER 1.2MG UNDER THE SKIN EVERY NIGHT)  . metoprolol succinate (TOPROL-XL) 25 MG 24 hr tablet TAKE 1 TABLET BY MOUTH EVERY DAY  . NOVOLOG FLEXPEN 100 UNIT/ML FlexPen INJECT 15U UNDER THE SKIN 3 TIMES DAILY WITH MEALS  . ONETOUCH ULTRA test strip USE TO CHECK BLOOD SUGAR DAILY  . simvastatin (ZOCOR) 40 MG tablet TAKE 1 TABLET BY MOUTH EVERYDAY AT BEDTIME  . acetaminophen (TYLENOL ARTHRITIS PAIN) 650 MG CR tablet Take 650 mg by mouth every 8 (eight) hours as needed for pain.  . Famotidine (PEPCID PO) Take 10 mg by mouth 2 (two) times daily.   . sertraline (ZOLOFT) 100 MG tablet Take 1 tablet (100 mg total) by mouth every morning.  Marland Kitchen Specialty Vitamins Products (VITA-RX DIABETIC VITAMIN) CAPS Take 1 packet by mouth daily. Diabetic Vitamins - 6 or 7 in 1 pack   No facility-administered encounter medications on file as of 01/18/2020.    Goals Addressed            This Visit's Progress   . Pharmacy Chronic Care Management       CARE PLAN ENTRY (see longitudinal plan of care for  additional care plan information)  Current Barriers:  . Chronic Disease Management support, education, and care coordination needs related to Hypertension, Hyperlipidemia, and Diabetes   Hypertension BP Readings from Last 3 Encounters:  09/08/19 110/70  08/12/19 135/82  05/15/19 104/71   . Pharmacist Clinical Goal(s): o Over the next 90 days, patient will work with PharmD and providers to maintain BP goal <130/80 . Current regimen:  . Amlodipine 5 mg qd . Enalapril  2.5 mg qd . Metoprolol succinate 25 mg qd . Interventions: o In depth medication review o Provided diet and exercise counseling . Patient self care activities - Over the next 90 days, patient will: o Check BP 2-3 times wekly, document, and provide at future appointments o Ensure daily salt intake < 2300 mg/day  Hyperlipidemia Lab Results  Component Value Date/Time   LDLCALC 69 05/15/2019 09:00 AM   LDLCALC SEE COMMENT 11/12/2013 07:45 AM   . Pharmacist Clinical Goal(s): o Over the next 90 days, patient will work with PharmD and providers to maintain LDL goal < 70 . Current regimen:  o Simvastatin 40 mg every evening . Interventions: o Comprehensive medication review o Updated medication list in the EMR o Provided dietary and lifestyle counseling  . Patient self care activities - Over the next 90 days, patient will: o Maintain regular exercise regimen with goal of 30 minutes/day 5 days weekly of moderate intensity exercise. Incorporate weight-bearing and muscle strengthening 3 times weekly. Great job with your hand and ankle weights! Keep it up! o Continue heart healthy/diabetic diet with reduced saturated fat, fried foods and sugary snacks.   Diabetes Lab Results  Component Value Date/Time   HGBA1C 6.6 08/12/2019 08:35 AM   HGBA1C 6.9 05/15/2019 08:58 AM   HGBA1C 13.4 (H) 11/12/2013 12:21 PM   . Pharmacist Clinical Goal(s): o Over the next 90 days, patient will work with PharmD and providers to maintain A1c goal <7%, maximize medication regimen and prevent hypoglycemia. . Current regimen:   Levemir 50 units Every evening at 10 pm  liraglutide 1.50m daily  novolog 15 units subq three times daily with meals . Interventions: o Comprehensive med review o Updated medications in EMR o Provided diet and exercise counseling o Collaborate with provider to adjust Novolog regimen and prevent afternoon hypoglycemia symptoms. . Patient self care activities - Over the next 90 days,  patient will: o Check blood sugar 3-4 times daily, document, and provide at future appointments o Contact provider with any episodes of hypoglycemia o Increase exercise to goal of 30 minutes, 5 days weekly o Avoid long periods of sitting    Medication management . Pharmacist Clinical Goal(s): o Over the next 90 days, patient will work with PharmD and providers to achieve optimal medication adherence . [facility]. Interventions o Comprehensive medication review performed. o Continue current medication management strategy . Patient self care activities - Over the next 90 days, patient will: o Focus on medication adherence by utilizing pill box o Take medications as prescribed o Report any questions or concerns to PharmD and/or provider(s)   Please see past updates related to this goal by clicking on the "Past Updates" button in the selected goal         Diabetes with stage 3b CKD   A1c goal <7%  Recent Relevant Labs: Lab Results  Component Value Date/Time   HGBA1C 6.7 11/20/2019 10:01 AM   HGBA1C 6.6 08/12/2019 08:35 AM   HGBA1C 13.4 (H) 11/12/2013 12:21 PM  MICROALBUR 30 (H) 05/15/2019 08:58 AM   MICROALBUR 10 08/06/2018 08:16 AM    Last diabetic Eye exam:  Lab Results  Component Value Date/Time   HMDIABEYEEXA No Retinopathy 04/13/2019 12:00 AM    Last diabetic Foot exam:  Lab Results  Component Value Date/Time   HMDIABFOOTEX normal 08/12/2014 12:00 AM    CMP Latest Ref Rng & Units 01/20/2020 11/20/2019 08/12/2019  Glucose 70 - 99 mg/dL 117(H) 90 87  BUN 8 - 23 mg/dL _0 Creatinine 0.44 - 1.00 mg/dL 1.47(H) 1.06(H) 1.12(H)  Sodium 135 - 145 mmol/L 140 141 141  Potassium 3.5 - 5.1 mmol/L 4.3 4.5 4.5  Chloride 98 - 111 mmol/L 104 103 103  CO2 22 - 32 mmol/L _1 Calcium 8.9 - 10.3 mg/dL 9.8 10.0 10.0  Total Protein 6.0 - 8.5 g/dL - - -  Total Bilirubin 0.0 - 1.2 mg/dL - - -  Alkaline Phos 39 - 117 IU/L - - -  AST 0 - 40 IU/L - -  -  ALT 0 - 32 IU/L - - -      Checking BG:2- 3x per Day  Recent FBG Readings: 104-112, one reading of 89 Recent 2hr PP BG readings:  After supper 138-155   Patient has failed these meds in past: Januvia, metformin ( GI issues) Patient is currently controlled on the following medications:  Insulin detemir 50 u q 10 pm  liraglutide 1.32m qd  novolog 16 u tid meals  We discussed: diet and exercise extensively and how to recognize and treat signs of hypoglycemia. She reports eating a daily snack around 3-4 pm to avoid the afternoon lows. She reports that she can't go longer than 5 hours without eating or she will experience hypoglycemia.  She also reports weight loss of ~13 lbs since around January. She reports cutting out fried foods , limiting starches and drinking water "all day long" since her bout with dehydration/ARF. She has gotten a new glucometer One Touch Reverie and loves it. She reports occasional episode of "pins and needles" in her feet. She attributes this to "years of standing on concrete floors all day" working at [employer] correlates episodes to days after spending a lot of time on her feet. We discussed elevating her legs and checking her feet daily. Today is Ms. Aloi's birthday.She is planning to go out to dinner with family to celebrate. Plan Continue current medications.   Hypertension associated with diabetes/ CKD stage 3   BP goal is:  <140/90  Office blood pressures are  BP Readings from Last 3 Encounters:  01/26/20 127/73  11/20/19 114/74  09/08/19 110/70   Her most recent EGFR = 533mmin BMP Latest Ref Rng & Units 01/20/2020 11/20/2019 08/12/2019  Glucose 70 - 99 mg/dL 117(H) 90 87  BUN 8 - 23 mg/dL _2 Creatinine 0.44 - 1.00 mg/dL 1.47(H) 1.06(H) 1.12(H)  BUN/Creat Ratio 12 - 28 - 18 19  Sodium 135 - 145 mmol/L 140 141 141  Potassium 3.5 - 5.1 mmol/L 4.3 4.5 4.5  Chloride 98 - 111 mmol/L 104 103 103  CO2 22 - 32 mmol/L _3 Calcium 8.9 -  10.3 mg/dL 9.8 10.0 10.0   Wrist monitor home readings< 130/80   Patient has failed these meds in the past: None Patient is currently controlled on the following medications:  . Amlodipine 5 mg qd . Enalapril 2.5 mg qd . Metoprolol succinate 25 mg qd  We discussed  diet and exercise extensively. Taking metoprolol at night.   Plan  Recommend continue current medications .      Hyperlipidemia   LDL goal < 70  Lipid Panel     Component Value Date/Time   CHOL 135 11/20/2019 1016   CHOL 152 08/06/2018 0816   CHOL 242 (H) 11/12/2013 0745   TRIG 178 (H) 11/20/2019 1016   TRIG 343 (H) 08/06/2018 0816   TRIG 1,562 (H) 11/12/2013 0745   HDL 42 11/20/2019 1016   HDL 22 (L) 11/12/2013 0745   LDLCALC 63 11/20/2019 1016   LDLCALC SEE COMMENT 11/12/2013 0745    Hepatic Function Latest Ref Rng & Units 05/15/2019 02/11/2019 11/07/2018  Total Protein 6.0 - 8.5 g/dL 7.5 7.5 7.6  Albumin 3.8 - 4.8 g/dL 4.4 4.7 4.5  AST 0 - 40 IU/L 116(H) 45(H) 70(H)  ALT 0 - 32 IU/L 52(H) 25 42(H)  Alk Phosphatase 39 - 117 IU/L 114 118(H) 119(H)  Total Bilirubin 0.0 - 1.2 mg/dL 0.3 0.4 0.2     The 10-year ASCVD risk score [name], et al., 2013) is: 15.6%   Values used to calculate the score:     Age: 71 years     Sex: Female     Is Non-Hispanic African American: No     Diabetic: Yes     Tobacco smoker: No     Systolic Blood Pressure: 893 mmHg     Is BP treated: Yes     HDL Cholesterol: 42 mg/dL     Total Cholesterol: 135 mg/dL   Patient has failed these meds in past: None Patient is currently controlled on the following medications:  . Simvastatin 40 mg qhs  We discussed:  diet and exercise extensively. Mrs. Gills has cut out fried food from her diet and limits starches. She has a flower garden which she enjoys working in and walks dogs when it isn't too hot. She has ankle and hand weight which she uses 2-3 times per week.  Plan  Recommend continue current medications.      Medication Management   [facility] medications Pt endorses 95 % compliance  We discussed: [facility] Weatherwax [facility] and enjoys getting out of the house and going to the pharmacy.  Plan  Continue current medication management strategy  Follow up: 3 month phone visit  Junita Push. Kenton Kingfisher PharmD, [facility] [phone]

## 2020-01-19 ENCOUNTER — Other Ambulatory Visit: Payer: Self-pay

## 2020-01-19 DIAGNOSIS — D472 Monoclonal gammopathy: Secondary | ICD-10-CM

## 2020-01-20 ENCOUNTER — Inpatient Hospital Stay: Payer: Medicare HMO | Attending: Oncology

## 2020-01-20 ENCOUNTER — Other Ambulatory Visit: Payer: Self-pay

## 2020-01-20 DIAGNOSIS — E1142 Type 2 diabetes mellitus with diabetic polyneuropathy: Secondary | ICD-10-CM | POA: Diagnosis not present

## 2020-01-20 DIAGNOSIS — Z803 Family history of malignant neoplasm of breast: Secondary | ICD-10-CM | POA: Insufficient documentation

## 2020-01-20 DIAGNOSIS — Z853 Personal history of malignant neoplasm of breast: Secondary | ICD-10-CM | POA: Insufficient documentation

## 2020-01-20 DIAGNOSIS — E1165 Type 2 diabetes mellitus with hyperglycemia: Secondary | ICD-10-CM | POA: Insufficient documentation

## 2020-01-20 DIAGNOSIS — Z79899 Other long term (current) drug therapy: Secondary | ICD-10-CM | POA: Diagnosis not present

## 2020-01-20 DIAGNOSIS — Z7982 Long term (current) use of aspirin: Secondary | ICD-10-CM | POA: Insufficient documentation

## 2020-01-20 DIAGNOSIS — Z90710 Acquired absence of both cervix and uterus: Secondary | ICD-10-CM | POA: Diagnosis not present

## 2020-01-20 DIAGNOSIS — E1122 Type 2 diabetes mellitus with diabetic chronic kidney disease: Secondary | ICD-10-CM | POA: Diagnosis not present

## 2020-01-20 DIAGNOSIS — D472 Monoclonal gammopathy: Secondary | ICD-10-CM | POA: Diagnosis present

## 2020-01-20 DIAGNOSIS — N182 Chronic kidney disease, stage 2 (mild): Secondary | ICD-10-CM | POA: Diagnosis not present

## 2020-01-20 DIAGNOSIS — Z800 Family history of malignant neoplasm of digestive organs: Secondary | ICD-10-CM | POA: Insufficient documentation

## 2020-01-20 DIAGNOSIS — Z8249 Family history of ischemic heart disease and other diseases of the circulatory system: Secondary | ICD-10-CM | POA: Insufficient documentation

## 2020-01-20 DIAGNOSIS — I129 Hypertensive chronic kidney disease with stage 1 through stage 4 chronic kidney disease, or unspecified chronic kidney disease: Secondary | ICD-10-CM | POA: Insufficient documentation

## 2020-01-20 DIAGNOSIS — Z8673 Personal history of transient ischemic attack (TIA), and cerebral infarction without residual deficits: Secondary | ICD-10-CM | POA: Insufficient documentation

## 2020-01-20 DIAGNOSIS — Z794 Long term (current) use of insulin: Secondary | ICD-10-CM | POA: Diagnosis not present

## 2020-01-20 LAB — CBC WITH DIFFERENTIAL/PLATELET
Abs Immature Granulocytes: 0.02 10*3/uL (ref 0.00–0.07)
Basophils Absolute: 0.1 10*3/uL (ref 0.0–0.1)
Basophils Relative: 1 %
Eosinophils Absolute: 0.2 10*3/uL (ref 0.0–0.5)
Eosinophils Relative: 3 %
HCT: 39.7 % (ref 36.0–46.0)
Hemoglobin: 13.6 g/dL (ref 12.0–15.0)
Immature Granulocytes: 0 %
Lymphocytes Relative: 44 %
Lymphs Abs: 3.5 10*3/uL (ref 0.7–4.0)
MCH: 31.5 pg (ref 26.0–34.0)
MCHC: 34.3 g/dL (ref 30.0–36.0)
MCV: 91.9 fL (ref 80.0–100.0)
Monocytes Absolute: 0.5 10*3/uL (ref 0.1–1.0)
Monocytes Relative: 6 %
Neutro Abs: 3.7 10*3/uL (ref 1.7–7.7)
Neutrophils Relative %: 46 %
Platelets: 254 10*3/uL (ref 150–400)
RBC: 4.32 MIL/uL (ref 3.87–5.11)
RDW: 13.4 % (ref 11.5–15.5)
WBC: 8.1 10*3/uL (ref 4.0–10.5)
nRBC: 0 % (ref 0.0–0.2)

## 2020-01-20 LAB — BASIC METABOLIC PANEL
Anion gap: 9 (ref 5–15)
BUN: 19 mg/dL (ref 8–23)
CO2: 27 mmol/L (ref 22–32)
Calcium: 9.8 mg/dL (ref 8.9–10.3)
Chloride: 104 mmol/L (ref 98–111)
Creatinine, Ser: 1.47 mg/dL — ABNORMAL HIGH (ref 0.44–1.00)
GFR, Estimated: 39 mL/min — ABNORMAL LOW (ref >60)
Glucose, Bld: 117 mg/dL — ABNORMAL HIGH (ref 70–99)
Potassium: 4.3 mmol/L (ref 3.5–5.1)
Sodium: 140 mmol/L (ref 135–145)

## 2020-01-21 LAB — IGG, IGA, IGM
IgA: 70 mg/dL — ABNORMAL LOW (ref 87–352)
IgG (Immunoglobin G), Serum: 1383 mg/dL (ref 586–1602)
IgM (Immunoglobulin M), Srm: 127 mg/dL (ref 26–217)

## 2020-01-21 LAB — PROTEIN ELECTROPHORESIS, SERUM
A/G Ratio: 1.1 (ref 0.7–1.7)
Albumin ELP: 3.8 g/dL (ref 2.9–4.4)
Alpha-1-Globulin: 0.3 g/dL (ref 0.0–0.4)
Alpha-2-Globulin: 0.9 g/dL (ref 0.4–1.0)
Beta Globulin: 1 g/dL (ref 0.7–1.3)
Gamma Globulin: 1.4 g/dL (ref 0.4–1.8)
Globulin, Total: 3.6 g/dL (ref 2.2–3.9)
M-Spike, %: 0.7 g/dL — ABNORMAL HIGH
Total Protein ELP: 7.4 g/dL (ref 6.0–8.5)

## 2020-01-21 LAB — KAPPA/LAMBDA LIGHT CHAINS
Kappa free light chain: 48.3 mg/L — ABNORMAL HIGH (ref 3.3–19.4)
Kappa, lambda light chain ratio: 2.37 — ABNORMAL HIGH (ref 0.26–1.65)
Lambda free light chains: 20.4 mg/L (ref 5.7–26.3)

## 2020-01-24 NOTE — Progress Notes (Signed)
[place]  [phone] [phone]) [phone]  ID: Kara Leach OB: Dec 07, 1950  MR#: [id number]  QHU#:[id number]  Patient Care Team: Venita Lick, NP as PCP - General (Nurse Practitioner) Bary Castilla Forest Gleason, MD (General Surgery) Anthonette Legato, MD (Internal Medicine) Leanor Kail, MD (Inactive) (Orthopedic Surgery) Lloyd Huger, MD as Consulting Physician (Oncology) Vladimir Faster, [facility] (Pharmacist)  CHIEF COMPLAINT: MGUS  INTERVAL HISTORY: Patient returns to clinic today for routine yearly evaluation and discussion of her laboratory results.  She continues to feel well and remains asymptomatic. She has no neurologic complaints.  She denies any recent fevers or illnesses.  She has a good appetite and denies weight loss.  She denies any pain.  She has no chest pain, shortness of breath, cough, or hemoptysis.  She denies any nausea, vomiting, constipation, or diarrhea.  She has no urinary complaints.  Patient offers no specific complaints today.  REVIEW OF SYSTEMS:   Review of Systems  Constitutional: Negative.  Negative for fever, malaise/fatigue and weight loss.  Respiratory: Negative.  Negative for shortness of breath.   Cardiovascular: Negative.  Negative for chest pain and leg swelling.  Gastrointestinal: Negative.  Negative for abdominal pain.  Genitourinary: Negative.  Negative for dysuria.  Musculoskeletal: Negative.  Negative for back pain.  Skin: Negative.  Negative for rash.  Neurological: Negative.  Negative for sensory change, focal weakness, weakness and headaches.  Psychiatric/Behavioral: Negative.  The patient is not nervous/anxious.     As per HPI. Otherwise, a complete review of systems is negative.  PAST MEDICAL HISTORY: Past Medical History:  Diagnosis Date  . Allergy   . Anxiety   . Arthritis    Osteoarthritis  . Cancer ([place]) 05/2018   possibly on left breast but not sure  . Chronic kidney disease   .  Depression   . Diabetes mellitus without complication ([place]) 4656  . Diabetic neuropathy (HCC)    feet  . Diverticulitis 2017  . GERD (gastroesophageal reflux disease)   . Headache    sinus and migraines on occasion  . Hyperlipidemia   . Hypertension   . Last menstrual period (LMP) > 10 days ago 1986  . Obesity   . Peripheral vascular disease (HCC)    neuropathy in hands and feet d/t diabetes  . Renal insufficiency    Stage 2 kidney disease  . Seizures ([place])    due to high blood sugars. last time = 09/2014  . Sleep apnea    has CPAP, can't tolerate  . Stroke ([place]) 1980   minor. left side remains weaker than right  . Wears hearing aid    right side  . Wrist weakness    left - after fracture and repair    PAST SURGICAL HISTORY: Past Surgical History:  Procedure Laterality Date  . ABDOMINAL HYSTERECTOMY  1986  . APPENDECTOMY  1964  . BREAST BIOPSY Left 07/03/2016   benign, fibrocystic changes with calcifications, usual ductal hyperplasia.   Marland Kitchen BREAST BIOPSY Left 04/23/2018   radial scar  . BREAST BIOPSY Left 08/13/2018   Procedure: WIDE LOCALIZATION AND OPEN BIOPSY OF LEFT BREAST, DIABETIC;  Surgeon: Robert Bellow, MD;  Location: [facility];  Service: General;  Laterality: Left;  . BREAST EXCISIONAL BIOPSY Left 08/13/2018  . CHOLECYSTECTOMY  2012  . COLONOSCOPY WITH PROPOFOL N/A 12/01/2015   Procedure: COLONOSCOPY WITH PROPOFOL;  Surgeon: [name], MD;  Location: [place];  Service: Endoscopy;  Laterality: N/A;  Diabetic - insulin Sleep  apnea  . PATELLA FRACTURE SURGERY Left    1. had to recenter kneecap, 2 and 3 surgeries were arthroscopies  . WRIST FRACTURE SURGERY Left 2005   Steel plate    FAMILY HISTORY Family History  Problem Relation Age of Onset  . Diabetes Mother   . Heart disease Mother   . Hypertension Mother   . Diabetes Sister   . Cancer Father        stomach  . Arthritis Sister   . Stroke Sister   . Hypertension Sister   . Breast  cancer Maternal Aunt 70       great aunt       ADVANCED DIRECTIVES:    HEALTH MAINTENANCE: Social History   Tobacco Use  . Smoking status: Never Smoker  . Smokeless tobacco: Never Used  Vaping Use  . Vaping Use: Never used  Substance Use Topics  . Alcohol use: Not Currently    Alcohol/week: 0.0 standard drinks    Comment: occasionally holiday, weddings  . Drug use: No     Colonoscopy:  PAP:  Bone density:  Lipid panel:  Allergies  Allergen Reactions  . Strawberry Extract Swelling    Lips swell and rash/whelps over body Difficulty breathing  . Ibuprofen Other (See Comments)    "ringing in ears", dizziness Also, Advil / Aleve / Motrin    Current Outpatient Medications  Medication Sig Dispense Refill  . acetaminophen (TYLENOL ARTHRITIS PAIN) 650 MG CR tablet Take 650 mg by mouth every 8 (eight) hours as needed for pain.    Marland Kitchen allopurinol (ZYLOPRIM) 100 MG tablet TAKE 1 TABLET BY MOUTH EVERY DAY (Patient taking differently: Take 50 mg by mouth daily. ) 90 tablet 1  . amLODipine (NORVASC) 5 MG tablet TAKE 1 TABLET BY MOUTH EVERY DAY 90 tablet 3  . aspirin EC 81 MG tablet Take 81 mg by mouth daily.    . BD PEN NEEDLE NANO U/F 32G X 4 MM MISC USE EVERY MORNING 100 each 12  . Blood Glucose Monitoring Suppl (ONETOUCH VERIO REFLECT) w/Device KIT USE TO CHECK BLOOD SUGAR DAILY    . Calcium Carb-Cholecalciferol (CALCIUM 600 + D PO) Take 1 tablet by mouth 2 (two) times daily. 1000 IU VITAMIN D    . enalapril (VASOTEC) 2.5 MG tablet Take 1 tablet (2.5 mg total) by mouth daily. 90 tablet 4  . Famotidine (PEPCID PO) Take 10 mg by mouth 2 (two) times daily.     . fluticasone (FLONASE) 50 MCG/ACT nasal spray PLACE 2 SPRAYS INTO BOTH NOSTRILS DAILY AS NEEDED FOR ALLERGIES. 48 mL 4  . Lancets (ONETOUCH ULTRASOFT) lancets USE TO CHECK BLOOD SUGAR DAILY    . LEVEMIR FLEXTOUCH 100 UNIT/ML FlexPen INJECT 60 UNITS INTO THE SKIN DAILY AT 10 PM. (Patient taking differently: 50 Units. INJECT 60  UNITS INTO THE SKIN DAILY AT 10 PM.) 15 mL 3  . liraglutide (VICTOZA) 18 MG/3ML SOPN ADMINISTER 1.2MG UNDER THE SKIN DAILY (Patient taking differently: ADMINISTER 1.2MG UNDER THE SKIN EVERY NIGHT) 18 mL 3  . metoprolol succinate (TOPROL-XL) 25 MG 24 hr tablet TAKE 1 TABLET BY MOUTH EVERY DAY 90 tablet 3  . NOVOLOG FLEXPEN 100 UNIT/ML FlexPen INJECT 15U UNDER THE SKIN 3 TIMES DAILY WITH MEALS 15 mL 4  . ONETOUCH ULTRA test strip USE TO CHECK BLOOD SUGAR DAILY 100 strip 12  . sertraline (ZOLOFT) 100 MG tablet Take 1 tablet (100 mg total) by mouth every morning. 90 tablet 4  . simvastatin (ZOCOR)  40 MG tablet TAKE 1 TABLET BY MOUTH EVERYDAY AT BEDTIME 90 tablet 3  . Specialty Vitamins Products (VITA-RX DIABETIC VITAMIN) CAPS Take 1 packet by mouth daily. Diabetic Vitamins - 6 or 7 in 1 pack     No current facility-administered medications for this visit.    OBJECTIVE: Vitals:   01/26/20 1101  BP: 127/73  Pulse: (!) 102  Resp: 20  Temp: (!) 97 F (36.1 C)  SpO2: 98%     Body mass index is 31.14 kg/m.    ECOG FS:0 - Asymptomatic  General: Well-developed, well-nourished, no acute distress. Eyes: Pink conjunctiva, anicteric sclera. HEENT: Normocephalic, moist mucous membranes. Lungs: No audible wheezing or coughing. Heart: Regular rate and rhythm. Abdomen: Soft, nontender, no obvious distention. Musculoskeletal: No edema, cyanosis, or clubbing. Neuro: Alert, answering all questions appropriately. Cranial nerves grossly intact. Skin: No rashes or petechiae noted. Psych: Normal affect.  LAB RESULTS:  Lab Results  Component Value Date   NA 140 01/20/2020   K 4.3 01/20/2020   CL 104 01/20/2020   CO2 27 01/20/2020   GLUCOSE 117 (H) 01/20/2020   BUN 19 01/20/2020   CREATININE 1.47 (H) 01/20/2020   CALCIUM 9.8 01/20/2020   PROT 7.5 05/15/2019   ALBUMIN 4.4 05/15/2019   AST 116 (H) 05/15/2019   ALT 52 (H) 05/15/2019   ALKPHOS 114 05/15/2019   BILITOT 0.3 05/15/2019   GFRNONAA 39  (L) 01/20/2020   GFRAA 63 11/20/2019    Lab Results  Component Value Date   WBC 8.1 01/20/2020   NEUTROABS 3.7 01/20/2020   HGB 13.6 01/20/2020   HCT 39.7 01/20/2020   MCV 91.9 01/20/2020   PLT 254 01/20/2020   Lab Results  Component Value Date   TOTALPROTELP 7.4 01/20/2020   ALBUMINELP 3.8 01/20/2020   A1GS 0.3 01/20/2020   A2GS 0.9 01/20/2020   BETS 1.0 01/20/2020   GAMS 1.4 01/20/2020   MSPIKE 0.7 (H) 01/20/2020   SPEI Comment 01/20/2020     STUDIES: No results found.  ASSESSMENT: MGUS  PLAN:    1.  MGUS: Upon review of patient's chart, her M spike was 0.3 in August 2013 and then 0.5 in March 2015.  Her most recent M spike was reported 0.7 which is essentially unchanged from 1 year ago where her M spike was reported 0.8. She does not have an M spike in her urine.  Her immunoglobulins continue to be within normal limits.  Her kappa/lambda light chain ratio is chronic and unchanged at 2.37.  She has a mildly elevated creatinine, but this is more likely related to her underlying diabetes and hypertension. Previously, metastatic bone survey was reported as negative.  Patient does not require bone marrow biopsy.  No intervention is needed.  Return to clinic in 1 year with repeat laboratory work and further evaluation.   2.  Chronic renal insufficiency: Patient's creatinine has trended up slightly to 1.47.  Continue follow-up with nephrology as indicated. 3.  Hyperglycemia: Patient continues to have improved blood glucose control. 4.  Peripheral neuropathy: Secondary to diabetes.  Patient does not complain of this today.     Patient expressed understanding and was in agreement with this plan. She also understands that She can call clinic at any time with any questions, concerns, or complaints.   Lloyd Huger, MD   01/26/2020 12:29 PM

## 2020-01-26 ENCOUNTER — Encounter: Payer: Self-pay | Admitting: Oncology

## 2020-01-26 ENCOUNTER — Inpatient Hospital Stay (HOSPITAL_BASED_OUTPATIENT_CLINIC_OR_DEPARTMENT_OTHER): Payer: Medicare HMO | Admitting: Oncology

## 2020-01-26 VITALS — BP 127/73 | HR 102 | Temp 97.0°F | Resp 20 | Wt 154.2 lb

## 2020-01-26 DIAGNOSIS — D472 Monoclonal gammopathy: Secondary | ICD-10-CM | POA: Diagnosis not present

## 2020-01-27 DIAGNOSIS — R69 Illness, unspecified: Secondary | ICD-10-CM | POA: Diagnosis not present

## 2020-01-31 NOTE — Patient Instructions (Addendum)
Visit Information: Kara Leach,   It was a pleasure speaking with you today. I hope you have a wonderful birthday! Thank you for letting me be part of your clinical team. Please call with any questions or concerns.   Almyra Free  Goals Addressed            This Visit's Progress   . Pharmacy Chronic Care Management       CARE PLAN ENTRY (see longitudinal plan of care for additional care plan information)  Current Barriers:  . Chronic Disease Management support, education, and care coordination needs related to Hypertension, Hyperlipidemia, and Diabetes   Hypertension BP Readings from Last 3 Encounters:  09/08/19 110/70  08/12/19 135/82  05/15/19 104/71   . Pharmacist Clinical Goal(s): o Over the next 90 days, patient will work with PharmD and providers to maintain BP goal <130/80 . Current regimen:  . Amlodipine 5 mg qd . Enalapril 2.5 mg qd . Metoprolol succinate 25 mg qd . Interventions: o In depth medication review o Provided diet and exercise counseling . Patient self care activities - Over the next 90 days, patient will: o Check BP 2-3 times wekly, document, and provide at future appointments o Ensure daily salt intake < 2300 mg/day  Hyperlipidemia Lab Results  Component Value Date/Time   LDLCALC 69 05/15/2019 09:00 AM   LDLCALC SEE COMMENT 11/12/2013 07:45 AM   . Pharmacist Clinical Goal(s): o Over the next 90 days, patient will work with PharmD and providers to maintain LDL goal < 70 . Current regimen:  o Simvastatin 40 mg every evening . Interventions: o Comprehensive medication review o Updated medication list in the EMR o Provided dietary and lifestyle counseling  . Patient self care activities - Over the next 90 days, patient will: o Maintain regular exercise regimen with goal of 30 minutes/day 5 days weekly of moderate intensity exercise. Incorporate weight-bearing and muscle strengthening 3 times weekly. Great job with your hand and ankle weights! Keep it  up! o Continue heart healthy/diabetic diet with reduced saturated fat, fried foods and sugary snacks.   Diabetes Lab Results  Component Value Date/Time   HGBA1C 6.6 08/12/2019 08:35 AM   HGBA1C 6.9 05/15/2019 08:58 AM   HGBA1C 13.4 (H) 11/12/2013 12:21 PM   . Pharmacist Clinical Goal(s): o Over the next 90 days, patient will work with PharmD and providers to maintain A1c goal <7%, maximize medication regimen and prevent hypoglycemia. . Current regimen:   Levemir 50 units Every evening at 10 pm  liraglutide 1.2mg  daily  novolog 15 units subq three times daily with meals . Interventions: o Comprehensive med review o Updated medications in EMR o Provided diet and exercise counseling o Collaborate with provider to adjust Novolog regimen and prevent afternoon hypoglycemia symptoms. . Patient self care activities - Over the next 90 days, patient will: o Check blood sugar 3-4 times daily, document, and provide at future appointments o Contact provider with any episodes of hypoglycemia o Increase exercise to goal of 30 minutes, 5 days weekly o Avoid long periods of sitting    Medication management . Pharmacist Clinical Goal(s): o Over the next 90 days, patient will work with PharmD and providers to achieve optimal medication adherence . Current pharmacy: [facility] . Interventions o Comprehensive medication review performed. o Continue current medication management strategy . Patient self care activities - Over the next 90 days, patient will: o Focus on medication adherence by utilizing pill box o Take medications as prescribed o Report any  questions or concerns to PharmD and/or provider(s)   Please see past updates related to this goal by clicking on the "Past Updates" button in the selected goal         The patient verbalized understanding of instructions, educational materials, and care plan provided today and agreed to receive a mailed copy of patient instructions,  educational materials, and care plan.   Telephone follow up appointment with pharmacy team member scheduled for:3 months  Junita Push. Tareek Sabo PharmD, BCPS Clinical Pharmacist [phone]  Hypertension, Adult High blood pressure (hypertension) is when the force of blood pumping through the arteries is too strong. The arteries are the blood vessels that carry blood from the heart throughout the body. Hypertension forces the heart to work harder to pump blood and may cause arteries to become narrow or stiff. Untreated or uncontrolled hypertension can cause a heart attack, heart failure, a stroke, kidney disease, and other problems. A blood pressure reading consists of a higher number over a lower number. Ideally, your blood pressure should be below 120/80. The first ("top") number is called the systolic pressure. It is a measure of the pressure in your arteries as your heart beats. The second ("bottom") number is called the diastolic pressure. It is a measure of the pressure in your arteries as the heart relaxes. What are the causes? The exact cause of this condition is not known. There are some conditions that result in or are related to high blood pressure. What increases the risk? Some risk factors for high blood pressure are under your control. The following factors may make you more likely to develop this condition:  Smoking.  Having type 2 diabetes mellitus, high cholesterol, or both.  Not getting enough exercise or physical activity.  Being overweight.  Having too much fat, sugar, calories, or salt (sodium) in your diet.  Drinking too much alcohol. Some risk factors for high blood pressure may be difficult or impossible to change. Some of these factors include:  Having chronic kidney disease.  Having a family history of high blood pressure.  Age. Risk increases with age.  Race. You may be at higher risk if you are African American.  Gender. Men are at higher risk than women  before age 32. After age 31, women are at higher risk than men.  Having obstructive sleep apnea.  Stress. What are the signs or symptoms? High blood pressure may not cause symptoms. Very high blood pressure (hypertensive crisis) may cause:  Headache.  Anxiety.  Shortness of breath.  Nosebleed.  Nausea and vomiting.  Vision changes.  Severe chest pain.  Seizures. How is this diagnosed? This condition is diagnosed by measuring your blood pressure while you are seated, with your arm resting on a flat surface, your legs uncrossed, and your feet flat on the floor. The cuff of the blood pressure monitor will be placed directly against the skin of your upper arm at the level of your heart. It should be measured at least twice using the same arm. Certain conditions can cause a difference in blood pressure between your right and left arms. Certain factors can cause blood pressure readings to be lower or higher than normal for a short period of time:  When your blood pressure is higher when you are in a health care provider's office than when you are at home, this is called white coat hypertension. Most people with this condition do not need medicines.  When your blood pressure is higher at home than when  you are in a health care provider's office, this is called masked hypertension. Most people with this condition may need medicines to control blood pressure. If you have a high blood pressure reading during one visit or you have normal blood pressure with other risk factors, you may be asked to:  Return on a different day to have your blood pressure checked again.  Monitor your blood pressure at home for 1 week or longer. If you are diagnosed with hypertension, you may have other blood or imaging tests to help your health care provider understand your overall risk for other conditions. How is this treated? This condition is treated by making healthy lifestyle changes, such as eating  healthy foods, exercising more, and reducing your alcohol intake. Your health care provider may prescribe medicine if lifestyle changes are not enough to get your blood pressure under control, and if:  Your systolic blood pressure is above 130.  Your diastolic blood pressure is above 80. Your personal target blood pressure may vary depending on your medical conditions, your age, and other factors. Follow these instructions at home: Eating and drinking   Eat a diet that is high in fiber and potassium, and low in sodium, added sugar, and fat. An example eating plan is called the DASH (Dietary Approaches to Stop Hypertension) diet. To eat this way: ? Eat plenty of fresh fruits and vegetables. Try to fill one half of your plate at each meal with fruits and vegetables. ? Eat whole grains, such as whole-wheat pasta, brown rice, or whole-grain bread. Fill about one fourth of your plate with whole grains. ? Eat or drink low-fat dairy products, such as skim milk or low-fat yogurt. ? Avoid fatty cuts of meat, processed or cured meats, and poultry with skin. Fill about one fourth of your plate with lean proteins, such as fish, chicken without skin, beans, eggs, or tofu. ? Avoid pre-made and processed foods. These tend to be higher in sodium, added sugar, and fat.  Reduce your daily sodium intake. Most people with hypertension should eat less than 1,500 mg of sodium a day.  Do not drink alcohol if: ? Your health care provider tells you not to drink. ? You are pregnant, may be pregnant, or are planning to become pregnant.  If you drink alcohol: ? Limit how much you use to:  0-1 drink a day for women.  0-2 drinks a day for men. ? Be aware of how much alcohol is in your drink. In the [place] one drink equals one 12 oz bottle of beer (355 mL), one 5 oz glass of wine (148 mL), or one 1 oz glass of hard liquor (44 mL). Lifestyle   Work with your health care provider to maintain a healthy body weight or  to lose weight. Ask what an ideal weight is for you.  Get at least 30 minutes of exercise most days of the week. Activities may include walking, swimming, or biking.  Include exercise to strengthen your muscles (resistance exercise), such as Pilates or lifting weights, as part of your weekly exercise routine. Try to do these types of exercises for 30 minutes at least 3 days a week.  Do not use any products that contain nicotine or tobacco, such as cigarettes, e-cigarettes, and chewing tobacco. If you need help quitting, ask your health care provider.  Monitor your blood pressure at home as told by your health care provider.  Keep all follow-up visits as told by your health care provider. This  is important. Medicines  Take over-the-counter and prescription medicines only as told by your health care provider. Follow directions carefully. Blood pressure medicines must be taken as prescribed.  Do not skip doses of blood pressure medicine. Doing this puts you at risk for problems and can make the medicine less effective.  Ask your health care provider about side effects or reactions to medicines that you should watch for. Contact a health care provider if you:  Think you are having a reaction to a medicine you are taking.  Have headaches that keep coming back (recurring).  Feel dizzy.  Have swelling in your ankles.  Have trouble with your vision. Get help right away if you:  Develop a severe headache or confusion.  Have unusual weakness or numbness.  Feel faint.  Have severe pain in your chest or abdomen.  Vomit repeatedly.  Have trouble breathing. Summary  Hypertension is when the force of blood pumping through your arteries is too strong. If this condition is not controlled, it may put you at risk for serious complications.  Your personal target blood pressure may vary depending on your medical conditions, your age, and other factors. For most people, a normal blood  pressure is less than 120/80.  Hypertension is treated with lifestyle changes, medicines, or a combination of both. Lifestyle changes include losing weight, eating a healthy, low-sodium diet, exercising more, and limiting alcohol. This information is not intended to replace advice given to you by your health care provider. Make sure you discuss any questions you have with your health care provider. Document Revised: 11/13/2017 Document Reviewed: 11/13/2017 Elsevier Patient Education  [employer].  Hypertension, Adult High blood pressure (hypertension) is when the force of blood pumping through the arteries is too strong. The arteries are the blood vessels that carry blood from the heart throughout the body. Hypertension forces the heart to work harder to pump blood and may cause arteries to become narrow or stiff. Untreated or uncontrolled hypertension can cause a heart attack, heart failure, a stroke, kidney disease, and other problems. A blood pressure reading consists of a higher number over a lower number. Ideally, your blood pressure should be below 120/80. The first ("top") number is called the systolic pressure. It is a measure of the pressure in your arteries as your heart beats. The second ("bottom") number is called the diastolic pressure. It is a measure of the pressure in your arteries as the heart relaxes. What are the causes? The exact cause of this condition is not known. There are some conditions that result in or are related to high blood pressure. What increases the risk? Some risk factors for high blood pressure are under your control. The following factors may make you more likely to develop this condition:  Smoking.  Having type 2 diabetes mellitus, high cholesterol, or both.  Not getting enough exercise or physical activity.  Being overweight.  Having too much fat, sugar, calories, or salt (sodium) in your diet.  Drinking too much alcohol. Some risk factors for  high blood pressure may be difficult or impossible to change. Some of these factors include:  Having chronic kidney disease.  Having a family history of high blood pressure.  Age. Risk increases with age.  Race. You may be at higher risk if you are African American.  Gender. Men are at higher risk than women before age 71. After age 36, women are at higher risk than men.  Having obstructive sleep apnea.  Stress. What are  the signs or symptoms? High blood pressure may not cause symptoms. Very high blood pressure (hypertensive crisis) may cause:  Headache.  Anxiety.  Shortness of breath.  Nosebleed.  Nausea and vomiting.  Vision changes.  Severe chest pain.  Seizures. How is this diagnosed? This condition is diagnosed by measuring your blood pressure while you are seated, with your arm resting on a flat surface, your legs uncrossed, and your feet flat on the floor. The cuff of the blood pressure monitor will be placed directly against the skin of your upper arm at the level of your heart. It should be measured at least twice using the same arm. Certain conditions can cause a difference in blood pressure between your right and left arms. Certain factors can cause blood pressure readings to be lower or higher than normal for a short period of time:  When your blood pressure is higher when you are in a health care provider's office than when you are at home, this is called white coat hypertension. Most people with this condition do not need medicines.  When your blood pressure is higher at home than when you are in a health care provider's office, this is called masked hypertension. Most people with this condition may need medicines to control blood pressure. If you have a high blood pressure reading during one visit or you have normal blood pressure with other risk factors, you may be asked to:  Return on a different day to have your blood pressure checked again.  Monitor your  blood pressure at home for 1 week or longer. If you are diagnosed with hypertension, you may have other blood or imaging tests to help your health care provider understand your overall risk for other conditions. How is this treated? This condition is treated by making healthy lifestyle changes, such as eating healthy foods, exercising more, and reducing your alcohol intake. Your health care provider may prescribe medicine if lifestyle changes are not enough to get your blood pressure under control, and if:  Your systolic blood pressure is above 130.  Your diastolic blood pressure is above 80. Your personal target blood pressure may vary depending on your medical conditions, your age, and other factors. Follow these instructions at home: Eating and drinking   Eat a diet that is high in fiber and potassium, and low in sodium, added sugar, and fat. An example eating plan is called the DASH (Dietary Approaches to Stop Hypertension) diet. To eat this way: ? Eat plenty of fresh fruits and vegetables. Try to fill one half of your plate at each meal with fruits and vegetables. ? Eat whole grains, such as whole-wheat pasta, brown rice, or whole-grain bread. Fill about one fourth of your plate with whole grains. ? Eat or drink low-fat dairy products, such as skim milk or low-fat yogurt. ? Avoid fatty cuts of meat, processed or cured meats, and poultry with skin. Fill about one fourth of your plate with lean proteins, such as fish, chicken without skin, beans, eggs, or tofu. ? Avoid pre-made and processed foods. These tend to be higher in sodium, added sugar, and fat.  Reduce your daily sodium intake. Most people with hypertension should eat less than 1,500 mg of sodium a day.  Do not drink alcohol if: ? Your health care provider tells you not to drink. ? You are pregnant, may be pregnant, or are planning to become pregnant.  If you drink alcohol: ? Limit how much you use to:  0-1 drink  a day for  women.  0-2 drinks a day for men. ? Be aware of how much alcohol is in your drink. In the [place] one drink equals one 12 oz bottle of beer (355 mL), one 5 oz glass of wine (148 mL), or one 1 oz glass of hard liquor (44 mL). Lifestyle   Work with your health care provider to maintain a healthy body weight or to lose weight. Ask what an ideal weight is for you.  Get at least 30 minutes of exercise most days of the week. Activities may include walking, swimming, or biking.  Include exercise to strengthen your muscles (resistance exercise), such as Pilates or lifting weights, as part of your weekly exercise routine. Try to do these types of exercises for 30 minutes at least 3 days a week.  Do not use any products that contain nicotine or tobacco, such as cigarettes, e-cigarettes, and chewing tobacco. If you need help quitting, ask your health care provider.  Monitor your blood pressure at home as told by your health care provider.  Keep all follow-up visits as told by your health care provider. This is important. Medicines  Take over-the-counter and prescription medicines only as told by your health care provider. Follow directions carefully. Blood pressure medicines must be taken as prescribed.  Do not skip doses of blood pressure medicine. Doing this puts you at risk for problems and can make the medicine less effective.  Ask your health care provider about side effects or reactions to medicines that you should watch for. Contact a health care provider if you:  Think you are having a reaction to a medicine you are taking.  Have headaches that keep coming back (recurring).  Feel dizzy.  Have swelling in your ankles.  Have trouble with your vision. Get help right away if you:  Develop a severe headache or confusion.  Have unusual weakness or numbness.  Feel faint.  Have severe pain in your chest or abdomen.  Vomit repeatedly.  Have trouble  breathing. Summary  Hypertension is when the force of blood pumping through your arteries is too strong. If this condition is not controlled, it may put you at risk for serious complications.  Your personal target blood pressure may vary depending on your medical conditions, your age, and other factors. For most people, a normal blood pressure is less than 120/80.  Hypertension is treated with lifestyle changes, medicines, or a combination of both. Lifestyle changes include losing weight, eating a healthy, low-sodium diet, exercising more, and limiting alcohol. This information is not intended to replace advice given to you by your health care provider. Make sure you discuss any questions you have with your health care provider. Document Revised: 11/13/2017 Document Reviewed: 11/13/2017 Elsevier Patient Education  2020 [employer].

## 2020-02-15 ENCOUNTER — Ambulatory Visit (INDEPENDENT_AMBULATORY_CARE_PROVIDER_SITE_OTHER): Payer: Medicare HMO

## 2020-02-15 VITALS — BP 127/73 | HR 71 | Temp 98.3°F | Ht 58.5 in | Wt 153.0 lb

## 2020-02-15 DIAGNOSIS — Z0000 Encounter for general adult medical examination without abnormal findings: Secondary | ICD-10-CM | POA: Diagnosis not present

## 2020-02-15 NOTE — Patient Instructions (Signed)
Ms. Kara Leach , Thank you for taking time to come for your Medicare Wellness Visit. I appreciate your ongoing commitment to your health goals. Please review the following plan we discussed and let me know if I can assist you in the future.   Screening recommendations/referrals: Colonoscopy: completed 12/01/2015, due 11/30/2020 Mammogram: completed 04/17/2019, due 04/16/2020 Bone Density: completed 10/09/2018 Recommended yearly ophthalmology/optometry visit for glaucoma screening and checkup Recommended yearly dental visit for hygiene and checkup  Vaccinations: Influenza vaccine: completed 11/20/2019, due 10/17/2020 Pneumococcal vaccine: completed 08/12/2019 Tdap vaccine: completed 01/15/2011, due 01/14/2021 Shingles vaccine: needs second dose of Shingrix   Covid-19: 06/03/2019, 05/13/2019  Advanced directives: copy in chart  Conditions/risks identified: none  Next appointment: Follow up in one year for your annual wellness visit    Preventive Care 67 Years and Older, Female Preventive care refers to lifestyle choices and visits with your health care provider that can promote health and wellness. What does preventive care include?  A yearly physical exam. This is also called an annual well check.  Dental exams once or twice a year.  Routine eye exams. Ask your health care provider how often you should have your eyes checked.  Personal lifestyle choices, including:  Daily care of your teeth and gums.  Regular physical activity.  Eating a healthy diet.  Avoiding tobacco and drug use.  Limiting alcohol use.  Practicing safe sex.  Taking low-dose aspirin every day.  Taking vitamin and mineral supplements as recommended by your health care provider. What happens during an annual well check? The services and screenings done by your health care provider during your annual well check will depend on your age, overall health, lifestyle risk factors, and family history of  disease. Counseling  Your health care provider may ask you questions about your:  Alcohol use.  Tobacco use.  Drug use.  Emotional well-being.  Home and relationship well-being.  Sexual activity.  Eating habits.  History of falls.  Memory and ability to understand (cognition).  Work and work Statistician.  Reproductive health. Screening  You may have the following tests or measurements:  Height, weight, and BMI.  Blood pressure.  Lipid and cholesterol levels. These may be checked every 5 years, or more frequently if you are over 65 years old.  Skin check.  Lung cancer screening. You may have this screening every year starting at age 84 if you have a 30-pack-year history of smoking and currently smoke or have quit within the past 15 years.  Fecal occult blood test (FOBT) of the stool. You may have this test every year starting at age 33.  Flexible sigmoidoscopy or colonoscopy. You may have a sigmoidoscopy every 5 years or a colonoscopy every 10 years starting at age 57.  Hepatitis C blood test.  Hepatitis B blood test.  Sexually transmitted disease (STD) testing.  Diabetes screening. This is done by checking your blood sugar (glucose) after you have not eaten for a while (fasting). You may have this done every 1-3 years.  Bone density scan. This is done to screen for osteoporosis. You may have this done starting at age 65.  Mammogram. This may be done every 1-2 years. Talk to your health care provider about how often you should have regular mammograms. Talk with your health care provider about your test results, treatment options, and if necessary, the need for more tests. Vaccines  Your health care provider may recommend certain vaccines, such as:  Influenza vaccine. This is recommended every year.  Tetanus,  diphtheria, and acellular pertussis (Tdap, Td) vaccine. You may need a Td booster every 10 years.  Zoster vaccine. You may need this after age  79.  Pneumococcal 13-valent conjugate (PCV13) vaccine. One dose is recommended after age 73.  Pneumococcal polysaccharide (PPSV23) vaccine. One dose is recommended after age 10. Talk to your health care provider about which screenings and vaccines you need and how often you need them. This information is not intended to replace advice given to you by your health care provider. Make sure you discuss any questions you have with your health care provider. Document Released: 04/01/2015 Document Revised: 11/23/2015 Document Reviewed: 01/04/2015 Elsevier Interactive Patient Education  2017 [place] Prevention in the Home Falls can cause injuries. They can happen to people of all ages. There are many things you can do to make your home safe and to help prevent falls. What can I do on the outside of my home?  Regularly fix the edges of walkways and driveways and fix any cracks.  Remove anything that might make you trip as you walk through a door, such as a raised step or threshold.  Trim any bushes or trees on the path to your home.  Use bright outdoor lighting.  Clear any walking paths of anything that might make someone trip, such as rocks or tools.  Regularly check to see if handrails are loose or broken. Make sure that both sides of any steps have handrails.  Any raised decks and porches should have guardrails on the edges.  Have any leaves, snow, or ice cleared regularly.  Use sand or salt on walking paths during winter.  Clean up any spills in your garage right away. This includes oil or grease spills. What can I do in the bathroom?  Use night lights.  Install grab bars by the toilet and in the tub and shower. Do not use towel bars as grab bars.  Use non-skid mats or decals in the tub or shower.  If you need to sit down in the shower, use a plastic, non-slip stool.  Keep the floor dry. Clean up any water that spills on the floor as soon as it happens.  Remove  soap buildup in the tub or shower regularly.  Attach bath mats securely with double-sided non-slip rug tape.  Do not have throw rugs and other things on the floor that can make you trip. What can I do in the bedroom?  Use night lights.  Make sure that you have a light by your bed that is easy to reach.  Do not use any sheets or blankets that are too big for your bed. They should not hang down onto the floor.  Have a firm chair that has side arms. You can use this for support while you get dressed.  Do not have throw rugs and other things on the floor that can make you trip. What can I do in the kitchen?  Clean up any spills right away.  Avoid walking on wet floors.  Keep items that you use a lot in easy-to-reach places.  If you need to reach something above you, use a strong step stool that has a grab bar.  Keep electrical cords out of the way.  Do not use floor polish or wax that makes floors slippery. If you must use wax, use non-skid floor wax.  Do not have throw rugs and other things on the floor that can make you trip. What can I do with  my stairs?  Do not leave any items on the stairs.  Make sure that there are handrails on both sides of the stairs and use them. Fix handrails that are broken or loose. Make sure that handrails are as long as the stairways.  Check any carpeting to make sure that it is firmly attached to the stairs. Fix any carpet that is loose or worn.  Avoid having throw rugs at the top or bottom of the stairs. If you do have throw rugs, attach them to the floor with carpet tape.  Make sure that you have a light switch at the top of the stairs and the bottom of the stairs. If you do not have them, ask someone to add them for you. What else can I do to help prevent falls?  Wear shoes that:  Do not have high heels.  Have rubber bottoms.  Are comfortable and fit you well.  Are closed at the toe. Do not wear sandals.  If you use a  stepladder:  Make sure that it is fully opened. Do not climb a closed stepladder.  Make sure that both sides of the stepladder are locked into place.  Ask someone to hold it for you, if possible.  Clearly mark and make sure that you can see:  Any grab bars or handrails.  First and last steps.  Where the edge of each step is.  Use tools that help you move around (mobility aids) if they are needed. These include:  Canes.  Walkers.  Scooters.  Crutches.  Turn on the lights when you go into a dark area. Replace any light bulbs as soon as they burn out.  Set up your furniture so you have a clear path. Avoid moving your furniture around.  If any of your floors are uneven, fix them.  If there are any pets around you, be aware of where they are.  Review your medicines with your doctor. Some medicines can make you feel dizzy. This can increase your chance of falling. Ask your doctor what other things that you can do to help prevent falls. This information is not intended to replace advice given to you by your health care provider. Make sure you discuss any questions you have with your health care provider. Document Released: 12/30/2008 Document Revised: 08/11/2015 Document Reviewed: 04/09/2014 Elsevier Interactive Patient Education  2017 [employer].

## 2020-02-15 NOTE — Progress Notes (Signed)
I connected with Kara Leach today by telephone and verified that I am speaking with the correct person using two identifiers. Location patient: home Location provider: work Persons participating in the virtual visit: Kara Leach, Kara Durand LPN.   I discussed the limitations, risks, security and privacy concerns of performing an evaluation and management service by telephone and the availability of in person appointments. I also discussed with the patient that there may be a patient responsible charge related to this service. The patient expressed understanding and verbally consented to this telephonic visit.    Interactive audio and video telecommunications were attempted between this provider and patient, however failed, due to patient having technical difficulties OR patient did not have access to video capability.  We continued and completed visit with audio only.     Vital signs may be patient reported or missing.  Subjective:   Kara Leach is a 69 y.o. female who presents for Medicare Annual (Subsequent) preventive examination.  Review of Systems     Cardiac Risk Factors include: advanced age (>13mn, >>51women);diabetes mellitus;dyslipidemia;hypertension;obesity (BMI >30kg/m2)     Objective:    Today's Vitals   02/15/20 1257 02/15/20 1300  BP: 127/73   Pulse: 71   Temp: 98.3 F (36.8 C)   Weight: 153 lb (69.4 kg)   Height: 4' 10.5" (1.486 m)   PainSc:  7    Body mass index is 31.43 kg/m.  Advanced Directives 02/15/2020 01/26/2020 02/04/2019 01/19/2019 08/13/2018 01/15/2018 12/05/2017  Does Patient Have a Medical Advance Directive? Yes Yes Yes No;Yes Yes No Yes  Type of AParamedicof [place] will [place] Town[place] will Living will;Healthcare Power of [place] will [place] - [place] will  Does patient want to make  changes to medical advance directive? - - - No - Patient declined No - Patient declined - -  Copy of [place] Chart? Yes - validated most recent copy scanned in chart (See row information) - Yes - validated most recent copy scanned in chart (See row information) No - copy requested Yes - validated most recent copy scanned in chart (See row information) - No - copy requested  Would patient like information on creating a medical advance directive? - - - No - Patient declined - No - Patient declined -    Current Medications (verified) Outpatient Encounter Medications as of 02/15/2020  Medication Sig  . acetaminophen (TYLENOL ARTHRITIS PAIN) 650 MG CR tablet Take 650 mg by mouth every 8 (eight) hours as needed for pain.  .Marland Kitchenallopurinol (ZYLOPRIM) 100 MG tablet TAKE 1 TABLET BY MOUTH EVERY DAY (Patient taking differently: Take 50 mg by mouth daily. )  . amLODipine (NORVASC) 5 MG tablet TAKE 1 TABLET BY MOUTH EVERY DAY  . aspirin EC 81 MG tablet Take 81 mg by mouth daily.  . BD PEN NEEDLE NANO U/F 32G X 4 MM MISC USE EVERY MORNING  . Blood Glucose Monitoring Suppl (ONETOUCH VERIO REFLECT) w/Device KIT USE TO CHECK BLOOD SUGAR DAILY  . Calcium Carb-Cholecalciferol (CALCIUM 600 + D PO) Take 1 tablet by mouth 2 (two) times daily. 1000 IU VITAMIN D  . enalapril (VASOTEC) 2.5 MG tablet Take 1 tablet (2.5 mg total) by mouth daily.  . Famotidine (PEPCID PO) Take 10 mg by mouth 2 (two) times daily.   . fluticasone (FLONASE) 50 MCG/ACT nasal spray PLACE 2 SPRAYS INTO BOTH NOSTRILS DAILY AS  NEEDED FOR ALLERGIES.  Marland Kitchen Lancets (ONETOUCH ULTRASOFT) lancets USE TO CHECK BLOOD SUGAR DAILY  . LEVEMIR FLEXTOUCH 100 UNIT/ML FlexPen INJECT 60 UNITS INTO THE SKIN DAILY AT 10 PM. (Patient taking differently: 50 Units. INJECT 60 UNITS INTO THE SKIN DAILY AT 10 PM.)  . liraglutide (VICTOZA) 18 MG/3ML SOPN ADMINISTER 1.2MG UNDER THE SKIN DAILY (Patient taking differently: ADMINISTER 1.2MG UNDER THE SKIN  EVERY NIGHT)  . metoprolol succinate (TOPROL-XL) 25 MG 24 hr tablet TAKE 1 TABLET BY MOUTH EVERY DAY  . NOVOLOG FLEXPEN 100 UNIT/ML FlexPen INJECT 15U UNDER THE SKIN 3 TIMES DAILY WITH MEALS  . ONETOUCH ULTRA test strip USE TO CHECK BLOOD SUGAR DAILY  . sertraline (ZOLOFT) 100 MG tablet Take 1 tablet (100 mg total) by mouth every morning.  . simvastatin (ZOCOR) 40 MG tablet TAKE 1 TABLET BY MOUTH EVERYDAY AT BEDTIME  . Specialty Vitamins Products (VITA-RX DIABETIC VITAMIN) CAPS Take 1 packet by mouth daily. Diabetic Vitamins - 6 or 7 in 1 pack   No facility-administered encounter medications on file as of 02/15/2020.    Allergies (verified) Strawberry extract and Ibuprofen   History: Past Medical History:  Diagnosis Date  . Allergy   . Anxiety   . Arthritis    Osteoarthritis  . Cancer ([place]) 05/2018   possibly on left breast but not sure  . Chronic kidney disease   . Depression   . Diabetes mellitus without complication ([place]) 1696  . Diabetic neuropathy (HCC)    feet  . Diverticulitis 2017  . GERD (gastroesophageal reflux disease)   . Headache    sinus and migraines on occasion  . Hyperlipidemia   . Hypertension   . Last menstrual period (LMP) > 10 days ago 1986  . Obesity   . Peripheral vascular disease (HCC)    neuropathy in hands and feet d/t diabetes  . Renal insufficiency    Stage 2 kidney disease  . Seizures ([place])    due to high blood sugars. last time = 09/2014  . Sleep apnea    has CPAP, can't tolerate  . Stroke ([place]) 1980   minor. left side remains weaker than right  . Wears hearing aid    right side  . Wrist weakness    left - after fracture and repair   Past Surgical History:  Procedure Laterality Date  . ABDOMINAL HYSTERECTOMY  1986  . APPENDECTOMY  1964  . BREAST BIOPSY Left 07/03/2016   benign, fibrocystic changes with calcifications, usual ductal hyperplasia.   Marland Kitchen BREAST BIOPSY Left 04/23/2018   radial scar  . BREAST BIOPSY Left 08/13/2018    Procedure: WIDE LOCALIZATION AND OPEN BIOPSY OF LEFT BREAST, DIABETIC;  Surgeon: Robert Bellow, MD;  Location: [facility];  Service: General;  Laterality: Left;  . BREAST EXCISIONAL BIOPSY Left 08/13/2018  . CHOLECYSTECTOMY  2012  . COLONOSCOPY WITH PROPOFOL N/A 12/01/2015   Procedure: COLONOSCOPY WITH PROPOFOL;  Surgeon: [name], MD;  Location: [facility];  Service: Endoscopy;  Laterality: N/A;  Diabetic - insulin Sleep apnea  . PATELLA FRACTURE SURGERY Left    1. had to recenter kneecap, 2 and 3 surgeries were arthroscopies  . WRIST FRACTURE SURGERY Left 2005   Steel plate   Family History  Problem Relation Age of Onset  . Diabetes Mother   . Heart disease Mother   . Hypertension Mother   . Diabetes Sister   . Cancer Father        stomach  . Arthritis  Sister   . Stroke Sister   . Hypertension Sister   . Breast cancer Maternal Aunt 66       great aunt   Social History   Socioeconomic History  . Marital status: Widowed    Spouse name: Not on file  . Number of children: Not on file  . Years of education: Not on file  . Highest education level: Not on file  Occupational History  . Occupation: worked at [employer] for [place] years    Comment: retired  Tobacco Use  . Smoking status: Never Smoker  . Smokeless tobacco: Never Used  Vaping Use  . Vaping Use: Never used  Substance and Sexual Activity  . Alcohol use: Not Currently    Alcohol/week: 0.0 standard drinks    Comment: occasionally holiday, weddings  . Drug use: No  . Sexual activity: Never  Other Topics Concern  . Not on file  Social History Narrative  . Not on file   Social Determinants of Health   Financial Resource Strain: Low Risk   . Difficulty of Paying Living Expenses: Not hard at all  Food Insecurity: No Food Insecurity  . Worried About Charity fundraiser in the Last Year: Never true  . Ran Out of Food in the Last Year: Never true  Transportation Needs: No Transportation Needs  . Lack of  Transportation (Medical): No  . Lack of Transportation (Non-Medical): No  Physical Activity: Sufficiently Active  . Days of Exercise per Week: 7 days  . Minutes of Exercise per Session: 30 min  Stress: No Stress Concern Present  . Feeling of Stress : Not at all  Social Connections:   . Frequency of Communication with Friends and Family: Not on file  . Frequency of Social Gatherings with Friends and Family: Not on file  . Attends Religious Services: Not on file  . Active Member of Clubs or Organizations: Not on file  . Attends Archivist Meetings: Not on file  . Marital Status: Not on file    Tobacco Counseling Counseling given: Not Answered   Clinical Intake:  Pre-visit preparation completed: Yes  Pain : 0-10 Pain Score: 7  Pain Type: Chronic pain Pain Location: Generalized Pain Descriptors / Indicators: Aching Pain Onset: More than a month ago Pain Frequency: Constant     Nutritional Status: BMI > 30  Obese Nutritional Risks: None Diabetes: Yes  How often do you need to have someone help you when you read instructions, pamphlets, or other written materials from your doctor or pharmacy?: 1 - Never What is the last grade level you completed in school?: 12th grade  Diabetic? Yes Nutrition Risk Assessment:  Has the patient had any N/V/D within the last 2 months?  No  Does the patient have any non-healing wounds?  No  Has the patient had any unintentional weight loss or weight gain?  No   Diabetes:  Is the patient diabetic?  Yes  If diabetic, was a CBG obtained today?  No  Did the patient bring in their glucometer from home?  No  How often do you monitor your CBG's? Twice daily.   Financial Strains and Diabetes Management:  Are you having any financial strains with the device, your supplies or your medication? No .  Does the patient want to be seen by Chronic Care Management for management of their diabetes?  No  Would the patient like to be referred  to a Nutritionist or for Diabetic Management?  No  Diabetic Exams:  Diabetic Eye Exam: Completed 04/13/2019 Diabetic Foot Exam: Overdue, Pt has been advised about the importance in completing this exam. Pt is scheduled for diabetic foot exam on next appointment.   Interpreter Needed?: No  Information entered by :: NAllen LPN   Activities of Daily Living In your present state of health, do you have any difficulty performing the following activities: 02/15/2020  Hearing? Y  Comment if it's too noisy, wears hearing aides  Vision? N  Difficulty concentrating or making decisions? Y  Comment short term memory  Walking or climbing stairs? Y  Dressing or bathing? N  Doing errands, shopping? N  Preparing Food and eating ? N  Using the Toilet? N  In the past six months, have you accidently leaked urine? Y  Comment wears depends  Do you have problems with loss of bowel control? N  Managing your Medications? N  Managing your Finances? N  Housekeeping or managing your Housekeeping? N  Some recent data might be hidden    Patient Care Team: Venita Lick, NP as PCP - General (Nurse Practitioner) Robert Bellow, MD (General Surgery) Anthonette Legato, MD (Internal Medicine) Leanor Kail, MD (Inactive) (Orthopedic Surgery) Lloyd Huger, MD as Consulting Physician (Oncology) Vladimir Faster, [facility] (Pharmacist)  Indicate any recent Medical Services you may have received from other than [facility] providers in the past year (date may be approximate).     Assessment:   This is a routine wellness examination for Kara Leach.  Hearing/Vision screen  Hearing Screening   125Hz  250Hz  500Hz  1000Hz  2000Hz  3000Hz  4000Hz  6000Hz  8000Hz   Right ear:           Left ear:           Vision Screening Comments: Regular eye exams, [place]  Dietary issues and exercise activities discussed: Current Exercise Habits: Home exercise routine, Type of exercise: walking, Time (Minutes): 30,  Frequency (Times/Week): 7, Weekly Exercise (Minutes/Week): 210  Goals    . DIET - INCREASE WATER INTAKE     Recommend continue drinking at least 6-8 glasses of water a day     . Patient Stated     02/15/2020, wants to weigh 125 pounds    . Pharmacy Chronic Care Management     CARE PLAN ENTRY (see longitudinal plan of care for additional care plan information)  Current Barriers:  . Chronic Disease Management support, education, and care coordination needs related to Hypertension, Hyperlipidemia, and Diabetes   Hypertension BP Readings from Last 3 Encounters:  09/08/19 110/70  08/12/19 135/82  05/15/19 104/71   . Pharmacist Clinical Goal(s): o Over the next 90 days, patient will work with PharmD and providers to maintain BP goal <130/80 . Current regimen:  . Amlodipine 5 mg qd . Enalapril 2.5 mg qd . Metoprolol succinate 25 mg qd . Interventions: o In depth medication review o Provided diet and exercise counseling . Patient self care activities - Over the next 90 days, patient will: o Check BP 2-3 times wekly, document, and provide at future appointments o Ensure daily salt intake < 2300 mg/day  Hyperlipidemia Lab Results  Component Value Date/Time   LDLCALC 69 05/15/2019 09:00 AM   LDLCALC SEE COMMENT 11/12/2013 07:45 AM   . Pharmacist Clinical Goal(s): o Over the next 90 days, patient will work with PharmD and providers to maintain LDL goal < 70 . Current regimen:  o Simvastatin 40 mg every evening . Interventions: o Comprehensive medication review o Updated medication list  in the EMR o Provided dietary and lifestyle counseling  . Patient self care activities - Over the next 90 days, patient will: o Maintain regular exercise regimen with goal of 30 minutes/day 5 days weekly of moderate intensity exercise. Incorporate weight-bearing and muscle strengthening 3 times weekly. Great job with your hand and ankle weights! Keep it up! o Continue heart healthy/diabetic diet  with reduced saturated fat, fried foods and sugary snacks.   Diabetes Lab Results  Component Value Date/Time   HGBA1C 6.6 08/12/2019 08:35 AM   HGBA1C 6.9 05/15/2019 08:58 AM   HGBA1C 13.4 (H) 11/12/2013 12:21 PM   . Pharmacist Clinical Goal(s): o Over the next 90 days, patient will work with PharmD and providers to maintain A1c goal <7%, maximize medication regimen and prevent hypoglycemia. . Current regimen:   Levemir 50 units Every evening at 10 pm  liraglutide 1.23m daily  novolog 15 units subq three times daily with meals . Interventions: o Comprehensive med review o Updated medications in EMR o Provided diet and exercise counseling o Collaborate with provider to adjust Novolog regimen and prevent afternoon hypoglycemia symptoms. . Patient self care activities - Over the next 90 days, patient will: o Check blood sugar 3-4 times daily, document, and provide at future appointments o Contact provider with any episodes of hypoglycemia o Increase exercise to goal of 30 minutes, 5 days weekly o Avoid long periods of sitting    Medication management . Pharmacist Clinical Goal(s): o Over the next 90 days, patient will work with PharmD and providers to achieve optimal medication adherence . [facility] pharmacy[facility]. Interventions o Comprehensive medication review performed. o Continue current medication management strategy . Patient self care activities - Over the next 90 days, patient will: o Focus on medication adherence by utilizing pill box o Take medications as prescribed o Report any questions or concerns to PharmD and/or provider(s)   Please see past updates related to this goal by clicking on the "Past Updates" button in the selected goal        Depression Screen PHQ 2/9 Scores 02/15/2020 11/20/2019 08/12/2019 05/15/2019 02/11/2019 02/04/2019 11/07/2018  PHQ - 2 Score 3 4 1 1 2  0 2  PHQ- 9 Score 5 11 3 4 11  - 8    Fall Risk Fall Risk  02/15/2020 02/04/2019  10/02/2018 08/28/2018 07/31/2018  Falls in the past year? 1 0 0 0 0  Comment tripped over a branch - - - -  Number falls in past yr: 0 0 0 - 0  Injury with Fall? 0 0 0 - 0  Comment - - - - -  Risk Factor Category  - - - - -  Risk for fall due to : Medication side effect - - - -  Follow up Falls evaluation completed;Education provided;Falls prevention discussed - - - -  Comment - - - - -    Any stairs in or around the home? Yes  If so, are there any without handrails? No  Home free of loose throw rugs in walkways, pet beds, electrical cords, etc? Yes  Adequate lighting in your home to reduce risk of falls? Yes   ASSISTIVE DEVICES UTILIZED TO PREVENT FALLS:  Life alert? No  Use of a cane, walker or w/c? No  Grab bars in the bathroom? No  Shower chair or bench in shower? Yes  Elevated toilet seat or a handicapped toilet? No   TIMED UP AND GO:  Was the test performed? No . .  Cognitive Function:     6CIT Screen 02/15/2020 12/05/2017  What Year? 0 points 0 points  What month? 0 points 0 points  What time? 0 points 0 points  Count back from 20 0 points 0 points  Months in reverse 0 points 0 points  Repeat phrase 0 points 0 points  Total Score 0 0    Immunizations Immunization History  Administered Date(s) Administered  . Fluad Quad(high Dose 65+) 02/11/2019, 11/20/2019  . Influenza,inj,Quad PF,6+ Mos 11/01/2016, 11/13/2017  . Influenza-Unspecified 12/20/2014, 12/28/2015  . PFIZER SARS-COV-2 Vaccination 05/13/2019, 06/03/2019  . Pneumococcal Conjugate-13 02/12/2018  . Pneumococcal Polysaccharide-23 02/14/2009, 08/12/2019  . Td 06/24/2008  . Tdap 01/08/2011  . Zoster 12/28/2015  . Zoster Recombinat (Shingrix) 10/13/2018    TDAP status: Up to date Flu Vaccine status: Up to date Pneumococcal vaccine status: Up to date Covid-19 vaccine status: Completed vaccines  Qualifies for Shingles Vaccine? Yes   Zostavax completed Yes   Shingrix Completed?: Yes needs second  dose Screening Tests Health Maintenance  Topic Date Due  . FOOT EXAM  02/11/2020  . OPHTHALMOLOGY EXAM  04/12/2020  . HEMOGLOBIN A1C  05/19/2020  . TETANUS/TDAP  01/07/2021  . MAMMOGRAM  04/16/2021  . COLONOSCOPY  11/30/2025  . INFLUENZA VACCINE  Completed  . DEXA SCAN  Completed  . COVID-19 Vaccine  Completed  . Hepatitis C Screening  Completed  . PNA vac Low Risk Adult  Completed    Health Maintenance  Health Maintenance Due  Topic Date Due  . FOOT EXAM  02/11/2020    Colorectal cancer screening: Completed 12/01/2015. Repeat every 5 years Mammogram status: Completed 04/17/2019. Repeat every year Bone Density status: Completed 10/09/2018.   Lung Cancer Screening: (Low Dose CT Chest recommended if Age 42-80 years, 30 pack-year currently smoking OR have quit w/in 15years.) does not qualify.   Lung Cancer Screening Referral: no  Additional Screening:  Hepatitis C Screening: does qualify; Completed 08/12/2015  Vision Screening: Recommended annual ophthalmology exams for early detection of glaucoma and other disorders of the eye. Is the patient up to date with their annual eye exam?  Yes  Who is the provider or what is the name of the office in which the patient attends annual eye exams? [facility] If pt is not established with a provider, would they like to be referred to a provider to establish care? No .   Dental Screening: Recommended annual dental exams for proper oral hygiene  Community Resource Referral / Chronic Care Management: CRR required this visit?  No   CCM required this visit?  No      Plan:     I have personally reviewed and noted the following in the patient's chart:   . Medical and social history . Use of alcohol, tobacco or illicit drugs  . Current medications and supplements . Functional ability and status . Nutritional status . Physical activity . Advanced directives . List of other physicians . Hospitalizations, surgeries, and ER  visits in previous 12 months . Vitals . Screenings to include cognitive, depression, and falls . Referrals and appointments  In addition, I have reviewed and discussed with patient certain preventive protocols, quality metrics, and best practice recommendations. A written personalized care plan for preventive services as well as general preventive health recommendations were provided to patient.     Kellie Simmering, LPN   [id number]   Nurse Notes:

## 2020-02-17 ENCOUNTER — Other Ambulatory Visit: Payer: Self-pay | Admitting: Family Medicine

## 2020-02-17 DIAGNOSIS — J309 Allergic rhinitis, unspecified: Secondary | ICD-10-CM

## 2020-02-17 NOTE — Telephone Encounter (Signed)
Routing to provider  

## 2020-02-19 ENCOUNTER — Other Ambulatory Visit: Payer: Self-pay | Admitting: Nurse Practitioner

## 2020-02-25 ENCOUNTER — Encounter: Payer: Self-pay | Admitting: Nurse Practitioner

## 2020-02-25 ENCOUNTER — Other Ambulatory Visit: Payer: Self-pay

## 2020-02-25 ENCOUNTER — Ambulatory Visit (INDEPENDENT_AMBULATORY_CARE_PROVIDER_SITE_OTHER): Payer: Medicare HMO | Admitting: Nurse Practitioner

## 2020-02-25 VITALS — BP 106/68 | HR 93 | Temp 98.2°F | Ht 59.06 in | Wt 153.4 lb

## 2020-02-25 DIAGNOSIS — M8588 Other specified disorders of bone density and structure, other site: Secondary | ICD-10-CM

## 2020-02-25 DIAGNOSIS — Z794 Long term (current) use of insulin: Secondary | ICD-10-CM

## 2020-02-25 DIAGNOSIS — E785 Hyperlipidemia, unspecified: Secondary | ICD-10-CM

## 2020-02-25 DIAGNOSIS — R69 Illness, unspecified: Secondary | ICD-10-CM | POA: Diagnosis not present

## 2020-02-25 DIAGNOSIS — N1832 Chronic kidney disease, stage 3b: Secondary | ICD-10-CM | POA: Diagnosis not present

## 2020-02-25 DIAGNOSIS — Z0000 Encounter for general adult medical examination without abnormal findings: Secondary | ICD-10-CM | POA: Diagnosis not present

## 2020-02-25 DIAGNOSIS — I152 Hypertension secondary to endocrine disorders: Secondary | ICD-10-CM

## 2020-02-25 DIAGNOSIS — K219 Gastro-esophageal reflux disease without esophagitis: Secondary | ICD-10-CM

## 2020-02-25 DIAGNOSIS — E1159 Type 2 diabetes mellitus with other circulatory complications: Secondary | ICD-10-CM | POA: Diagnosis not present

## 2020-02-25 DIAGNOSIS — D472 Monoclonal gammopathy: Secondary | ICD-10-CM

## 2020-02-25 DIAGNOSIS — M1A39X0 Chronic gout due to renal impairment, multiple sites, without tophus (tophi): Secondary | ICD-10-CM

## 2020-02-25 DIAGNOSIS — E559 Vitamin D deficiency, unspecified: Secondary | ICD-10-CM | POA: Diagnosis not present

## 2020-02-25 DIAGNOSIS — E1122 Type 2 diabetes mellitus with diabetic chronic kidney disease: Secondary | ICD-10-CM

## 2020-02-25 DIAGNOSIS — E538 Deficiency of other specified B group vitamins: Secondary | ICD-10-CM

## 2020-02-25 DIAGNOSIS — F324 Major depressive disorder, single episode, in partial remission: Secondary | ICD-10-CM

## 2020-02-25 DIAGNOSIS — E1169 Type 2 diabetes mellitus with other specified complication: Secondary | ICD-10-CM | POA: Diagnosis not present

## 2020-02-25 DIAGNOSIS — N6323 Unspecified lump in the left breast, lower outer quadrant: Secondary | ICD-10-CM

## 2020-02-25 LAB — BAYER DCA HB A1C WAIVED: HB A1C (BAYER DCA - WAIVED): 6.1 % (ref <7.0)

## 2020-02-25 MED ORDER — NOVOLOG FLEXPEN 100 UNIT/ML ~~LOC~~ SOPN: PEN_INJECTOR | SUBCUTANEOUS | 4 refills | Status: DC

## 2020-02-25 MED ORDER — VICTOZA 18 MG/3ML ~~LOC~~ SOPN
PEN_INJECTOR | SUBCUTANEOUS | 4 refills | Status: DC
Start: 2020-02-25 — End: 2021-02-27

## 2020-02-25 MED ORDER — AMLODIPINE BESYLATE 5 MG PO TABS
5.0000 mg | ORAL_TABLET | Freq: Every day | ORAL | 4 refills | Status: DC
Start: 2020-02-25 — End: 2020-05-25

## 2020-02-25 MED ORDER — ENALAPRIL MALEATE 2.5 MG PO TABS
2.5000 mg | ORAL_TABLET | Freq: Every day | ORAL | 4 refills | Status: DC
Start: 2020-02-25 — End: 2021-02-27

## 2020-02-25 MED ORDER — ALLOPURINOL 100 MG PO TABS
100.0000 mg | ORAL_TABLET | Freq: Every day | ORAL | 4 refills | Status: DC
Start: 2020-02-25 — End: 2020-09-23

## 2020-02-25 MED ORDER — SIMVASTATIN 40 MG PO TABS
ORAL_TABLET | ORAL | 4 refills | Status: DC
Start: 2020-02-25 — End: 2021-02-27

## 2020-02-25 MED ORDER — SERTRALINE HCL 100 MG PO TABS
100.0000 mg | ORAL_TABLET | Freq: Every morning | ORAL | 4 refills | Status: DC
Start: 2020-02-25 — End: 2021-02-27

## 2020-02-25 MED ORDER — METOPROLOL SUCCINATE ER 25 MG PO TB24
25.0000 mg | ORAL_TABLET | Freq: Every day | ORAL | 4 refills | Status: DC
Start: 2020-02-25 — End: 2020-12-14

## 2020-02-25 MED ORDER — LEVEMIR FLEXTOUCH 100 UNIT/ML ~~LOC~~ SOPN
PEN_INJECTOR | SUBCUTANEOUS | 4 refills | Status: DC
Start: 2020-02-25 — End: 2020-08-13

## 2020-02-25 NOTE — Assessment & Plan Note (Signed)
Chronic, ongoing.  Continue Allopurinol 100 MG daily.  Discussed at length with patient.  Check uric acid level today. 

## 2020-02-25 NOTE — Patient Instructions (Signed)

## 2020-02-25 NOTE — Assessment & Plan Note (Addendum)
Ongoing, will recheck this today and recommend continue supplement.

## 2020-02-25 NOTE — Assessment & Plan Note (Signed)
Chronic, ongoing with A1C 6.1% today and urine ALB 30 with A:C <30 at last visit.  Recommend she reduce Levemir by 3 units every 3 days with goal of BS <130 in morning, if BS starts creeping upwards then maintain the Levemir dose she is taking at that time + monitor pre meal insulin and reduce by 2 units.  Will consider change to [place] and maximizing Victoza dependent on future A1C readings.  CMP today.  Continue to check BS at home daily and document.  Return in 3 months.

## 2020-02-25 NOTE — Assessment & Plan Note (Signed)
Chronic, ongoing.  Continue current medication regimen and adjust as needed.  Lipid panel today, recent LDL 69. 

## 2020-02-25 NOTE — Assessment & Plan Note (Signed)
Continue collaboration with Dr. Grayland Ormond at [facility].

## 2020-02-25 NOTE — Assessment & Plan Note (Signed)
Chronic, ongoing with CKD 3.  BP at goal home and office -- have recommended she bring BP cuff into next office visit so we can compare levels -- may be able to discontinue Amlodipine and minimize medications if continued below goal levels.  Continue current medication regimen and adjust as needed.  Focus on DASH diet.  Enalapril for kidney protection.  CMP and TSH today.  Return in 3 months.

## 2020-02-25 NOTE — Assessment & Plan Note (Signed)
Ongoing, stable.  Continue Vitamin D and calcium daily.  Check Vit D level today.  Repeat DEXA in July 2025.   

## 2020-02-25 NOTE — Assessment & Plan Note (Signed)
Check Vitamin D level and adjust as needed.

## 2020-02-25 NOTE — Progress Notes (Signed)
BP 106/68   Pulse 93   Temp 98.2 F (36.8 C) (Oral)   Ht 4' 11.06" (1.5 m)   Wt 153 lb 6.4 oz (69.6 kg)   LMP  (LMP Unknown)   SpO2 98%   BMI 30.93 kg/m    Subjective:    Patient ID: Kara Leach, female    DOB: Oct 08, 1950, 69 y.o.   MRN: [id number]  HPI: Kara Leach is a 69 y.o. female presenting on 02/25/2020 for comprehensive medical examination. Current medical complaints include:none  She currently lives with: self Menopausal Symptoms: no   DIABETES Last A1C 6.7% in December.  Continues on Novolog pre meal and Levemir + Victoza 1.2 MG daily. Endorses she has not followed diet as well and has not been walking as much due to Covid 19 pandemic.  Hypoglycemic episodes:no Polydipsia/polyuria: no Visual disturbance: no Chest pain: no Paresthesias: no Glucose Monitoring: yes             Accucheck frequency: BID             Fasting glucose: 107 this morning (averaging 130 range)             Post prandial:             Evening: < 150 at night             Before meals: Taking Insulin?: yes             Long acting insulin: Levemir 50 units             Short acting insulin: Novolog 16 units Blood Pressure Monitoring: daily Retinal Examination: Up To Date Foot Exam: Up to Date Pneumovax: Up to Date Influenza: Up to Date Aspirin: yes   HYPERTENSION / HYPERLIPIDEMIA Continues on Amlodipine, Enalapril, Metoprolol, and ASA + Simvastatin. Satisfied with current treatment? yes Duration of hypertension: chronic BP monitoring frequency: daily BP range: this morning 138/82 -- often around this range at home BP medication side effects: no Duration of hyperlipidemia: chronic Cholesterol medication side effects: no Cholesterol supplements: none Medication compliance: good compliance Aspirin: yes Recent stressors: no Recurrent headaches: no Visual changes: no Palpitations: no Dyspnea: no Chest pain: no Lower extremity edema: occasionally to feet Dizzy/lightheaded:  no The 10-year ASCVD risk score [name], et al., 2013) is: 11.1%   Values used to calculate the score:     Age: 51 years     Sex: Female     Is Non-Hispanic African American: No     Diabetic: Yes     Tobacco smoker: No     Systolic Blood Pressure: 944 mmHg     Is BP treated: Yes     HDL Cholesterol: 42 mg/dL     Total Cholesterol: 135 mg/dL   CHRONIC KIDNEY DISEASE With GFR ranging from 39-56 over past two years.  Recent GFR 39 in November 2021.  Does see Dr. Holley Raring, but has not seen in awhile.   CKD status: stable Medications renally dose: yes Previous renal evaluation: no Pneumovax:  Up to Date Influenza Vaccine:  Up to Date  OSTEOPENIA: Continues on daily calcium and Vitamin D supplements.  Noted on DEXA in July 2020.  Was taking Tylenol frequently for arthritis pain, but now taking one in morning and one before bedtime due to recent elevation in AST/ALT 116/52 in February 2021.  MGUS: Followed by Dr. Grayland Ormond at [facility] and last seen 01/26/2020.  On review note recommends return in one year and  continue to monitor labs yearly.  She also follows with Dr. Bary Castilla after her mammograms.  GOUT Continues on Allopurinol 100 MG daily.  Has not had gout flare in several years. Duration:chronic Swelling: no Redness: no Trauma: no Recent dietary change or indiscretion: no Fevers: no Nausea/vomiting: no Status:  stable Treatments attempted: Allopurinol  DEPRESSION Continues on Sertraline 100 MG.  She is satisfied with current regimen, reports increase symptoms this time of year.  Denies SI/HI.  Winter months and holidays are a little tougher.   Mood status: stable Satisfied with current treatment?: yes Symptom severity: moderate  Duration of current treatment : chronic Side effects: no Medication compliance: good compliance Psychotherapy/counseling: none Previous psychiatric medications: Zoloft Depressed mood: yes Anxious mood: yes Anhedonia: no Significant weight  loss or gain: no Insomnia: none Fatigue: no Feelings of worthlessness or guilt: no Impaired concentration/indecisiveness: yes Suicidal ideations: no Hopelessness: no Crying spells: no Depression screen [facility] 2/9 02/25/2020 02/15/2020 11/20/2019 08/12/2019 05/15/2019  Decreased Interest 1 0 2 0 0  Down, Depressed, Hopeless _0 PHQ - 2 Score _1 Altered sleeping 3 0 _2 Tired, decreased energy _3 Change in appetite 0 0 1 0 0  Feeling bad or failure about yourself  _4 0 0  Trouble concentrating 0 0 0 0 0  Moving slowly or fidgety/restless 0 0 0 0 0  Suicidal thoughts 0 0 0 0 0  PHQ-9 Score _5 Difficult doing work/chores - Somewhat difficult Not difficult at all Not difficult at all Not difficult at all  Some recent data might be hidden    The patient does not have a history of falls. I did not complete a risk assessment for falls. A plan of care for falls was not documented.   Past Medical History:  Past Medical History:  Diagnosis Date  . Allergy   . Anxiety   . Arthritis    Osteoarthritis  . Cancer ([place]) 05/2018   possibly on left breast but not sure  . Chronic kidney disease   . Depression   . Diabetes mellitus without complication ([place]) 8372  . Diabetic neuropathy (HCC)    feet  . Diverticulitis 2017  . GERD (gastroesophageal reflux disease)   . Headache    sinus and migraines on occasion  . Hyperlipidemia   . Hypertension   . Last menstrual period (LMP) > 10 days ago 1986  . Obesity   . Peripheral vascular disease (HCC)    neuropathy in hands and feet d/t diabetes  . Renal insufficiency    Stage 2 kidney disease  . Seizures ([place])    due to high blood sugars. last time = 09/2014  . Sleep apnea    has CPAP, can't tolerate  . Stroke ([place]) 1980   minor. left side remains weaker than right  . Wears hearing aid    right side  . Wrist weakness    left - after fracture and repair    Surgical History:  Past Surgical History:   Procedure Laterality Date  . ABDOMINAL HYSTERECTOMY  1986  . APPENDECTOMY  1964  . BREAST BIOPSY Left 07/03/2016   benign, fibrocystic changes with calcifications, usual ductal hyperplasia.   Marland Kitchen BREAST BIOPSY Left 04/23/2018   radial scar  . BREAST BIOPSY Left 08/13/2018   Procedure: WIDE LOCALIZATION AND OPEN BIOPSY OF LEFT BREAST, DIABETIC;  Surgeon: Hervey Ard  W, MD;  Location: [facility];  Service: General;  Laterality: Left;  . BREAST EXCISIONAL BIOPSY Left 08/13/2018  . CHOLECYSTECTOMY  2012  . COLONOSCOPY WITH PROPOFOL N/A 12/01/2015   Procedure: COLONOSCOPY WITH PROPOFOL;  Surgeon: [name], MD;  Location: [facility];  Service: Endoscopy;  Laterality: N/A;  Diabetic - insulin Sleep apnea  . PATELLA FRACTURE SURGERY Left    1. had to recenter kneecap, 2 and 3 surgeries were arthroscopies  . WRIST FRACTURE SURGERY Left 2005   Steel plate    Medications:  Current Outpatient Medications on File Prior to Visit  Medication Sig  . acetaminophen (TYLENOL) 650 MG CR tablet Take 650 mg by mouth every 8 (eight) hours as needed for pain.  Marland Kitchen aspirin EC 81 MG tablet Take 81 mg by mouth daily.  . BD PEN NEEDLE NANO U/F 32G X 4 MM MISC USE EVERY MORNING  . Blood Glucose Monitoring Suppl (ONETOUCH VERIO REFLECT) w/Device KIT USE TO CHECK BLOOD SUGAR DAILY  . Calcium Carb-Cholecalciferol (CALCIUM 600 + D PO) Take 1 tablet by mouth 2 (two) times daily. 1000 IU VITAMIN D  . Famotidine (PEPCID PO) Take 10 mg by mouth 2 (two) times daily.   . fluticasone (FLONASE) 50 MCG/ACT nasal spray PLACE 2 SPRAYS INTO BOTH NOSTRILS DAILY AS NEEDED FOR ALLERGIES.  Marland Kitchen Lancets (ONETOUCH ULTRASOFT) lancets USE TO CHECK BLOOD SUGAR DAILY  . ONETOUCH ULTRA test strip USE TO CHECK BLOOD SUGAR DAILY  . Specialty Vitamins Products (VITA-RX DIABETIC VITAMIN) CAPS Take 1 packet by mouth daily. Diabetic Vitamins - 6 or 7 in 1 pack   No current facility-administered medications on file prior to visit.     Allergies:  Allergies  Allergen Reactions  . Strawberry Extract Swelling    Lips swell and rash/whelps over body Difficulty breathing  . Ibuprofen Other (See Comments)    "ringing in ears", dizziness Also, Advil / Aleve / Motrin    Social History:  Social History   Socioeconomic History  . Marital status: Widowed    Spouse name: Not on file  . Number of children: Not on file  . Years of education: Not on file  . Highest education level: Not on file  Occupational History  . Occupation: worked at [employer] for [place] years    Comment: retired  Tobacco Use  . Smoking status: Never Smoker  . Smokeless tobacco: Never Used  Vaping Use  . Vaping Use: Never used  Substance and Sexual Activity  . Alcohol use: Not Currently    Alcohol/week: 0.0 standard drinks    Comment: occasionally holiday, weddings  . Drug use: No  . Sexual activity: Never  Other Topics Concern  . Not on file  Social History Narrative  . Not on file   Social Determinants of Health   Financial Resource Strain: Low Risk   . Difficulty of Paying Living Expenses: Not hard at all  Food Insecurity: No Food Insecurity  . Worried About Charity fundraiser in the Last Year: Never true  . Ran Out of Food in the Last Year: Never true  Transportation Needs: No Transportation Needs  . Lack of Transportation (Medical): No  . Lack of Transportation (Non-Medical): No  Physical Activity: Sufficiently Active  . Days of Exercise per Week: 7 days  . Minutes of Exercise per Session: 30 min  Stress: No Stress Concern Present  . Feeling of Stress : Not at all  Social Connections: Not on file  Intimate Partner Violence:  Not on file   Social History   Tobacco Use  Smoking Status Never Smoker  Smokeless Tobacco Never Used   Social History   Substance and Sexual Activity  Alcohol Use Not Currently  . Alcohol/week: 0.0 standard drinks   Comment: occasionally holiday, weddings    Family History:  Family History   Problem Relation Age of Onset  . Diabetes Mother   . Heart disease Mother   . Hypertension Mother   . Diabetes Sister   . Cancer Father        stomach  . Arthritis Sister   . Stroke Sister   . Hypertension Sister   . Breast cancer Maternal Aunt 61       great aunt    Past medical history, surgical history, medications, allergies, family history and social history reviewed with patient today and changes made to appropriate areas of the chart.   Review of Systems - negative All other ROS negative except what is listed above and in the HPI.      Objective:    BP 106/68   Pulse 93   Temp 98.2 F (36.8 C) (Oral)   Ht 4' 11.06" (1.5 m)   Wt 153 lb 6.4 oz (69.6 kg)   LMP  (LMP Unknown)   SpO2 98%   BMI 30.93 kg/m   Wt Readings from Last 3 Encounters:  02/25/20 153 lb 6.4 oz (69.6 kg)  02/15/20 153 lb (69.4 kg)  01/26/20 154 lb 3.2 oz (69.9 kg)    Physical Exam Constitutional:      General: She is awake. She is not in acute distress.    Appearance: She is well-developed. She is not ill-appearing.  HENT:     Head: Normocephalic and atraumatic.     Right Ear: Hearing, tympanic membrane, ear canal and external ear normal. No drainage.     Left Ear: Hearing, tympanic membrane, ear canal and external ear normal. No drainage.     Nose: Nose normal.     Right Sinus: No maxillary sinus tenderness or frontal sinus tenderness.     Left Sinus: No maxillary sinus tenderness or frontal sinus tenderness.     Mouth/Throat:     Mouth: Mucous membranes are moist.     Pharynx: Oropharynx is clear. Uvula midline. No pharyngeal swelling, oropharyngeal exudate or posterior oropharyngeal erythema.  Eyes:     General: Lids are normal.        Right eye: No discharge.        Left eye: No discharge.     Extraocular Movements: Extraocular movements intact.     Conjunctiva/sclera: Conjunctivae normal.     Pupils: Pupils are equal, round, and reactive to light.     Visual Fields: Right eye  visual fields normal and left eye visual fields normal.  Neck:     Thyroid: No thyromegaly.     Vascular: No carotid bruit.     Trachea: Trachea normal.  Cardiovascular:     Rate and Rhythm: Normal rate and regular rhythm.     Heart sounds: Normal heart sounds. No murmur heard. No gallop.   Pulmonary:     Effort: Pulmonary effort is normal. No accessory muscle usage or respiratory distress.     Breath sounds: Normal breath sounds.  Chest:     Comments: Deferred per request, has performed with Dr. Bary Castilla. Abdominal:     General: Bowel sounds are normal.     Palpations: Abdomen is soft. There is no hepatomegaly or splenomegaly.  Tenderness: There is no abdominal tenderness.  Musculoskeletal:        General: Normal range of motion.     Cervical back: Normal range of motion and neck supple.     Right lower leg: No edema.     Left lower leg: No edema.  Lymphadenopathy:     Head:     Right side of head: No submental, submandibular, tonsillar, preauricular or posterior auricular adenopathy.     Left side of head: No submental, submandibular, tonsillar, preauricular or posterior auricular adenopathy.     Cervical: No cervical adenopathy.  Skin:    General: Skin is warm and dry.     Capillary Refill: Capillary refill takes less than 2 seconds.     Findings: No rash.  Neurological:     Mental Status: She is alert and oriented to person, place, and time.     Cranial Nerves: Cranial nerves are intact.     Gait: Gait is intact.     Deep Tendon Reflexes: Reflexes are normal and symmetric.     Reflex Scores:      Brachioradialis reflexes are 2+ on the right side and 2+ on the left side.      Patellar reflexes are 2+ on the right side and 2+ on the left side. Psychiatric:        Attention and Perception: Attention normal.        Mood and Affect: Mood normal.        Speech: Speech normal.        Behavior: Behavior normal. Behavior is cooperative.        Thought Content: Thought  content normal.        Judgment: Judgment normal.    Diabetic Foot Exam - Simple   Simple Foot Form Visual Inspection No deformities, no ulcerations, no other skin breakdown bilaterally: Yes Sensation Testing Intact to touch and monofilament testing bilaterally: Yes Pulse Check Posterior Tibialis and Dorsalis pulse intact bilaterally: Yes Comments    Results for orders placed or performed in visit on 01/20/20  IgG, IgA, IgM  Result Value Ref Range   IgG (Immunoglobin G), Serum 1,383 586 - 1,602 mg/dL   IgA 70 (L) 87 - 352 mg/dL   IgM (Immunoglobulin M), Srm 127 26 - 217 mg/dL  Kappa/lambda light chains  Result Value Ref Range   Kappa free light chain 48.3 (H) 3.3 - 19.4 mg/L   Lamda free light chains 20.4 5.7 - 26.3 mg/L   Kappa, lamda light chain ratio 2.37 (H) 0.26 - 1.65  Protein electrophoresis, serum  Result Value Ref Range   Total Protein ELP 7.4 6.0 - 8.5 g/dL   Albumin ELP 3.8 2.9 - 4.4 g/dL   Alpha-1-Globulin 0.3 0.0 - 0.4 g/dL   Alpha-2-Globulin 0.9 0.4 - 1.0 g/dL   Beta Globulin 1.0 0.7 - 1.3 g/dL   Gamma Globulin 1.4 0.4 - 1.8 g/dL   M-Spike, % 0.7 (H) Not Observed g/dL   SPE Interp. Comment    Comment Comment    Globulin, Total 3.6 2.2 - 3.9 g/dL   A/G Ratio 1.1 0.7 - 1.7  Basic metabolic panel  Result Value Ref Range   Sodium 140 135 - 145 mmol/L   Potassium 4.3 3.5 - 5.1 mmol/L   Chloride 104 98 - 111 mmol/L   CO2 27 22 - 32 mmol/L   Glucose, Bld 117 (H) 70 - 99 mg/dL   BUN 19 8 - 23 mg/dL   Creatinine, Ser 1.47 (  H) 0.44 - 1.00 mg/dL   Calcium 9.8 8.9 - 10.3 mg/dL   GFR, Estimated 39 (L) >60 mL/min   Anion gap 9 5 - 15  CBC with Differential  Result Value Ref Range   WBC 8.1 4.0 - 10.5 K/uL   RBC 4.32 3.87 - 5.11 MIL/uL   Hemoglobin 13.6 12.0 - 15.0 g/dL   HCT 39.7 36.0 - 46.0 %   MCV 91.9 80.0 - 100.0 fL   MCH 31.5 26.0 - 34.0 pg   MCHC 34.3 30.0 - 36.0 g/dL   RDW 13.4 11.5 - 15.5 %   Platelets 254 150 - 400 K/uL   nRBC 0.0 0.0 - 0.2 %    Neutrophils Relative % 46 %   Neutro Abs 3.7 1.7 - 7.7 K/uL   Lymphocytes Relative 44 %   Lymphs Abs 3.5 0.7 - 4.0 K/uL   Monocytes Relative 6 %   Monocytes Absolute 0.5 0.1 - 1.0 K/uL   Eosinophils Relative 3 %   Eosinophils Absolute 0.2 0.0 - 0.5 K/uL   Basophils Relative 1 %   Basophils Absolute 0.1 0.0 - 0.1 K/uL   Immature Granulocytes 0 %   Abs Immature Granulocytes 0.02 0.00 - 0.07 K/uL      Assessment & Plan:   Problem List Items Addressed This Visit      Cardiovascular and Mediastinum   Hypertension associated with diabetes (HCC)    Chronic, ongoing with CKD 3.  BP at goal home and office -- have recommended she bring BP cuff into next office visit so we can compare levels -- may be able to discontinue Amlodipine and minimize medications if continued below goal levels.  Continue current medication regimen and adjust as needed.  Focus on DASH diet.  Enalapril for kidney protection.  CMP and TSH today.  Return in 3 months.      Relevant Medications   insulin detemir (LEVEMIR FLEXTOUCH) 100 UNIT/ML FlexPen   liraglutide (VICTOZA) 18 MG/3ML SOPN   metoprolol succinate (TOPROL-XL) 25 MG 24 hr tablet   insulin aspart (NOVOLOG FLEXPEN) 100 UNIT/ML FlexPen   simvastatin (ZOCOR) 40 MG tablet   amLODipine (NORVASC) 5 MG tablet   enalapril (VASOTEC) 2.5 MG tablet   Other Relevant Orders   Bayer DCA Hb A1c Waived   CBC with Differential/Platelet   Comprehensive metabolic panel   TSH     Digestive   Gastroesophageal reflux disease without esophagitis    Chronic, stable.  Continue current medication regimen, Famotidine, and check Mag level today.        Relevant Orders   Magnesium     Endocrine   Hyperlipidemia associated with type 2 diabetes mellitus (HCC)    Chronic, ongoing.  Continue current medication regimen and adjust as needed.  Lipid panel today, recent LDL 69.      Relevant Medications   insulin detemir (LEVEMIR FLEXTOUCH) 100 UNIT/ML FlexPen   liraglutide  (VICTOZA) 18 MG/3ML SOPN   insulin aspart (NOVOLOG FLEXPEN) 100 UNIT/ML FlexPen   simvastatin (ZOCOR) 40 MG tablet   enalapril (VASOTEC) 2.5 MG tablet   Other Relevant Orders   Bayer DCA Hb A1c Waived   Comprehensive metabolic panel   Lipid Panel w/o Chol/HDL Ratio   Type 2 diabetes mellitus with stage 3b chronic kidney disease, with long-term current use of insulin (HCC) - Primary    Chronic, ongoing with A1C 6.1% today and urine ALB 30 with A:C <30 at last visit.  Recommend she reduce Levemir by 3 units  every 3 days with goal of BS <130 in morning, if BS starts creeping upwards then maintain the Levemir dose she is taking at that time + monitor pre meal insulin and reduce by 2 units.  Will consider change to [place] and maximizing Victoza dependent on future A1C readings.  CMP today.  Continue to check BS at home daily and document.  Return in 3 months.      Relevant Medications   insulin detemir (LEVEMIR FLEXTOUCH) 100 UNIT/ML FlexPen   liraglutide (VICTOZA) 18 MG/3ML SOPN   insulin aspart (NOVOLOG FLEXPEN) 100 UNIT/ML FlexPen   simvastatin (ZOCOR) 40 MG tablet   enalapril (VASOTEC) 2.5 MG tablet   Other Relevant Orders   Bayer DCA Hb A1c Waived   Comprehensive metabolic panel   Ambulatory referral to Nephrology     Musculoskeletal and Integument   Osteopenia    Ongoing, stable.  Continue Vitamin D and calcium daily.  Check Vit D level today.  Repeat DEXA in July 2025.          Other   MGUS (monoclonal gammopathy of unknown significance)    Continue collaboration with Dr. Grayland Ormond at [facility].      Mass of lower outer quadrant of left breast    Continue to collaborate and review records from Dr. Bary Castilla.      Depression, major, single episode, in partial remission (HCC)    Chronic, stable.  Denies SI/HI.  Continue current medication regimen, Sertraline 100 MG daily, and adjust as needed. Recommend meditation and relaxation techniques at home.      Relevant Medications    sertraline (ZOLOFT) 100 MG tablet   Vitamin D deficiency    Check Vitamin D level and adjust as needed.      Relevant Orders   VITAMIN D 25 Hydroxy (Vit-D Deficiency, Fractures)   Chronic gout due to renal impairment of multiple sites without tophus    Chronic, ongoing.  Continue Allopurinol 100 MG daily.  Discussed at length with patient.  Check uric acid level today.      Relevant Orders   Uric acid   B12 deficiency    Ongoing, will recheck this today and recommend continue supplement.      Relevant Orders   Vitamin B12    Other Visit Diagnoses    Encounter for annual physical exam       Annual labs today to include CBC, CMP, TSH, lipid       Follow up plan: Return in about 3 months (around 05/25/2020) for T2DM, HTN/HLD, MOOD.   LABORATORY TESTING:  - Pap smear: not applicable  IMMUNIZATIONS:   - Tdap: Tetanus vaccination status reviewed: last tetanus booster within 10 years. - Influenza: Up to date - Pneumovax: Up to date - Prevnar: Up to date - HPV: Not applicable - Zostavax vaccine: Up to date  SCREENING: -Mammogram: Up to date  - Colonoscopy: Up to date  - Bone Density: Up to date  -Hearing Test: Not applicable  -Spirometry: Not applicable   PATIENT COUNSELING:   Advised to take 1 mg of folate supplement per day if capable of pregnancy.   Sexuality: Discussed sexually transmitted diseases, partner selection, use of condoms, avoidance of unintended pregnancy  and contraceptive alternatives.   Advised to avoid cigarette smoking.  I discussed with the patient that most people either abstain from alcohol or drink within safe limits (<=14/week and <=4 drinks/occasion for males, <=7/weeks and <= 3 drinks/occasion for females) and that the risk for alcohol disorders  and other health effects rises proportionally with the number of drinks per week and how often a drinker exceeds daily limits.  Discussed cessation/primary prevention of drug use and availability of  treatment for abuse.   Diet: Encouraged to adjust caloric intake to maintain  or achieve ideal body weight, to reduce intake of dietary saturated fat and total fat, to limit sodium intake by avoiding high sodium foods and not adding table salt, and to maintain adequate dietary potassium and calcium preferably from fresh fruits, vegetables, and low-fat dairy products.    Stressed the importance of regular exercise  Injury prevention: Discussed safety belts, safety helmets, smoke detector, smoking near bedding or upholstery.   Dental health: Discussed importance of regular tooth brushing, flossing, and dental visits.    NEXT PREVENTATIVE PHYSICAL DUE IN 1 YEAR. Return in about 3 months (around 05/25/2020) for T2DM, HTN/HLD, MOOD.

## 2020-02-25 NOTE — Assessment & Plan Note (Signed)
Continue to collaborate and review records from Dr. Bary Castilla.

## 2020-02-25 NOTE — Assessment & Plan Note (Signed)
Chronic, stable.  Continue current medication regimen, Famotidine, and check Mag level today.   

## 2020-02-25 NOTE — Assessment & Plan Note (Signed)
Chronic, stable.  Denies SI/HI.  Continue current medication regimen, Sertraline 100 MG daily, and adjust as needed. Recommend meditation and relaxation techniques at home. 

## 2020-02-26 ENCOUNTER — Other Ambulatory Visit: Payer: Self-pay | Admitting: Nurse Practitioner

## 2020-02-26 DIAGNOSIS — D72820 Lymphocytosis (symptomatic): Secondary | ICD-10-CM

## 2020-02-26 LAB — CBC WITH DIFFERENTIAL/PLATELET
Basophils Absolute: 0.1 10*3/uL (ref 0.0–0.2)
Basos: 1 %
EOS (ABSOLUTE): 0.2 10*3/uL (ref 0.0–0.4)
Eos: 2 %
Hematocrit: 38.3 % (ref 34.0–46.6)
Hemoglobin: 13.1 g/dL (ref 11.1–15.9)
Immature Grans (Abs): 0.1 10*3/uL (ref 0.0–0.1)
Immature Granulocytes: 1 %
Lymphocytes Absolute: 4.4 10*3/uL — ABNORMAL HIGH (ref 0.7–3.1)
Lymphs: 36 %
MCH: 30.9 pg (ref 26.6–33.0)
MCHC: 34.2 g/dL (ref 31.5–35.7)
MCV: 90 fL (ref 79–97)
Monocytes Absolute: 0.9 10*3/uL (ref 0.1–0.9)
Monocytes: 8 %
Neutrophils Absolute: 6.5 10*3/uL (ref 1.4–7.0)
Neutrophils: 52 %
Platelets: 266 10*3/uL (ref 150–450)
RBC: 4.24 x10E6/uL (ref 3.77–5.28)
RDW: 12.9 % (ref 11.7–15.4)
WBC: 12.2 10*3/uL — ABNORMAL HIGH (ref 3.4–10.8)

## 2020-02-26 LAB — COMPREHENSIVE METABOLIC PANEL
ALT: 31 IU/L (ref 0–32)
AST: 42 IU/L — ABNORMAL HIGH (ref 0–40)
Albumin/Globulin Ratio: 1.6 (ref 1.2–2.2)
Albumin: 4.4 g/dL (ref 3.8–4.8)
Alkaline Phosphatase: 108 IU/L (ref 44–121)
BUN/Creatinine Ratio: 12 (ref 12–28)
BUN: 15 mg/dL (ref 8–27)
Bilirubin Total: 0.4 mg/dL (ref 0.0–1.2)
CO2: 22 mmol/L (ref 20–29)
Calcium: 9.5 mg/dL (ref 8.7–10.3)
Chloride: 103 mmol/L (ref 96–106)
Creatinine, Ser: 1.3 mg/dL — ABNORMAL HIGH (ref 0.57–1.00)
GFR calc Af Amer: 49 mL/min/{1.73_m2} — ABNORMAL LOW (ref >59)
GFR calc non Af Amer: 43 mL/min/{1.73_m2} — ABNORMAL LOW (ref >59)
Globulin, Total: 2.7 g/dL (ref 1.5–4.5)
Glucose: 161 mg/dL — ABNORMAL HIGH (ref 65–99)
Potassium: 4.2 mmol/L (ref 3.5–5.2)
Sodium: 142 mmol/L (ref 134–144)
Total Protein: 7.1 g/dL (ref 6.0–8.5)

## 2020-02-26 LAB — LIPID PANEL W/O CHOL/HDL RATIO
Cholesterol, Total: 137 mg/dL (ref 100–199)
HDL: 40 mg/dL (ref >39)
LDL Chol Calc (NIH): 69 mg/dL (ref 0–99)
Triglycerides: 163 mg/dL — ABNORMAL HIGH (ref 0–149)
VLDL Cholesterol Cal: 28 mg/dL (ref 5–40)

## 2020-02-26 LAB — VITAMIN B12: Vitamin B-12: 816 pg/mL (ref 232–1245)

## 2020-02-26 LAB — MAGNESIUM: Magnesium: 1.9 mg/dL (ref 1.6–2.3)

## 2020-02-26 LAB — VITAMIN D 25 HYDROXY (VIT D DEFICIENCY, FRACTURES): Vit D, 25-Hydroxy: 46.8 ng/mL (ref 30.0–100.0)

## 2020-02-26 LAB — TSH: TSH: 1.87 u[IU]/mL (ref 0.450–4.500)

## 2020-02-26 LAB — URIC ACID: Uric Acid: 4.8 mg/dL (ref 3.0–7.2)

## 2020-02-26 NOTE — Progress Notes (Signed)
Please let Ms. Bucks know her labs have returned, overall they look baseline and stable with exception of mild elevation in white blood cell count and lymphocytes.  I would like to recheck this in 4 weeks via an outpatient lab visit.  Please schedule this.  Kidney function continues to show mild kidney disease, which we will continue to monitor.  No medication changes needed at this time.  Keep up the great work. Keep being awesome!!  Thank you for allowing me to participate in your care. Kindest regards, Valery Chance

## 2020-03-03 DIAGNOSIS — N1832 Chronic kidney disease, stage 3b: Secondary | ICD-10-CM | POA: Diagnosis not present

## 2020-03-03 DIAGNOSIS — E1122 Type 2 diabetes mellitus with diabetic chronic kidney disease: Secondary | ICD-10-CM | POA: Diagnosis not present

## 2020-03-03 DIAGNOSIS — I10 Essential (primary) hypertension: Secondary | ICD-10-CM | POA: Diagnosis not present

## 2020-03-24 ENCOUNTER — Other Ambulatory Visit: Payer: Self-pay | Admitting: General Surgery

## 2020-03-24 DIAGNOSIS — N6092 Unspecified benign mammary dysplasia of left breast: Secondary | ICD-10-CM

## 2020-03-28 ENCOUNTER — Other Ambulatory Visit: Payer: Medicare HMO

## 2020-03-28 ENCOUNTER — Other Ambulatory Visit: Payer: Self-pay

## 2020-03-28 DIAGNOSIS — D72820 Lymphocytosis (symptomatic): Secondary | ICD-10-CM | POA: Diagnosis not present

## 2020-03-29 LAB — CBC WITH DIFFERENTIAL/PLATELET
Basophils Absolute: 0.1 10*3/uL (ref 0.0–0.2)
Basos: 1 %
EOS (ABSOLUTE): 0.3 10*3/uL (ref 0.0–0.4)
Eos: 3 %
Hematocrit: 41.1 % (ref 34.0–46.6)
Hemoglobin: 13.8 g/dL (ref 11.1–15.9)
Immature Grans (Abs): 0 10*3/uL (ref 0.0–0.1)
Immature Granulocytes: 0 %
Lymphocytes Absolute: 5.6 10*3/uL — ABNORMAL HIGH (ref 0.7–3.1)
Lymphs: 52 %
MCH: 30.9 pg (ref 26.6–33.0)
MCHC: 33.6 g/dL (ref 31.5–35.7)
MCV: 92 fL (ref 79–97)
Monocytes Absolute: 0.8 10*3/uL (ref 0.1–0.9)
Monocytes: 8 %
Neutrophils Absolute: 3.8 10*3/uL (ref 1.4–7.0)
Neutrophils: 36 %
Platelets: 301 10*3/uL (ref 150–450)
RBC: 4.47 x10E6/uL (ref 3.77–5.28)
RDW: 12.8 % (ref 11.7–15.4)
WBC: 10.6 10*3/uL (ref 3.4–10.8)

## 2020-03-29 NOTE — Progress Notes (Signed)
Good morning, please let Semaja know her labs have returned and white blood cell count and lymphocytes are trending back down.  White blood cell count is normal.  If any questions let me know and we will recheck next visit.  Have a great day!! Keep being awesome!!  Thank you for allowing me to participate in your care. Kindest regards, Caisen Mangas

## 2020-04-04 ENCOUNTER — Telehealth: Payer: Self-pay | Admitting: Pharmacist

## 2020-04-04 NOTE — Progress Notes (Signed)
Chronic Care Management Pharmacy Assistant   Name: Kara Leach  MRN: [id number] DOB: March 24, 1950  Reason for Encounter: Disease State Review for Diabetes    PCP : Venita Lick, NP  Allergies:   Allergies  Allergen Reactions  . Strawberry Extract Swelling    Lips swell and rash/whelps over body Difficulty breathing  . Ibuprofen Other (See Comments)    "ringing in ears", dizziness Also, Advil / Aleve / Motrin    Medications: Outpatient Encounter Medications as of 04/04/2020  Medication Sig  . acetaminophen (TYLENOL) 650 MG CR tablet Take 650 mg by mouth every 8 (eight) hours as needed for pain.  Marland Kitchen allopurinol (ZYLOPRIM) 100 MG tablet Take 1 tablet (100 mg total) by mouth daily.  Marland Kitchen amLODipine (NORVASC) 5 MG tablet Take 1 tablet (5 mg total) by mouth daily.  Marland Kitchen aspirin EC 81 MG tablet Take 81 mg by mouth daily.  . BD PEN NEEDLE NANO U/F 32G X 4 MM MISC USE EVERY MORNING  . Blood Glucose Monitoring Suppl (ONETOUCH VERIO REFLECT) w/Device KIT USE TO CHECK BLOOD SUGAR DAILY  . Calcium Carb-Cholecalciferol (CALCIUM 600 + D PO) Take 1 tablet by mouth 2 (two) times daily. 1000 IU VITAMIN D  . enalapril (VASOTEC) 2.5 MG tablet Take 1 tablet (2.5 mg total) by mouth daily.  . Famotidine (PEPCID PO) Take 10 mg by mouth 2 (two) times daily.   . fluticasone (FLONASE) 50 MCG/ACT nasal spray PLACE 2 SPRAYS INTO BOTH NOSTRILS DAILY AS NEEDED FOR ALLERGIES.  Marland Kitchen insulin aspart (NOVOLOG FLEXPEN) 100 UNIT/ML FlexPen INJECT 16U UNDER THE SKIN 3 TIMES DAILY WITH MEALS  . insulin detemir (LEVEMIR FLEXTOUCH) 100 UNIT/ML FlexPen INJECT 50 UNITS INTO THE SKIN DAILY AT 10 PM.  . Lancets (ONETOUCH ULTRASOFT) lancets USE TO CHECK BLOOD SUGAR DAILY  . liraglutide (VICTOZA) 18 MG/3ML SOPN ADMINISTER 1.2MG UNDER THE SKIN DAILY  . metoprolol succinate (TOPROL-XL) 25 MG 24 hr tablet Take 1 tablet (25 mg total) by mouth daily.  Glory Rosebush ULTRA test strip USE TO CHECK BLOOD SUGAR DAILY  . sertraline  (ZOLOFT) 100 MG tablet Take 1 tablet (100 mg total) by mouth every morning.  . simvastatin (ZOCOR) 40 MG tablet TAKE 1 TABLET BY MOUTH EVERYDAY AT BEDTIME  . Specialty Vitamins Products (VITA-RX DIABETIC VITAMIN) CAPS Take 1 packet by mouth daily. Diabetic Vitamins - 6 or 7 in 1 pack   No facility-administered encounter medications on file as of 04/04/2020.    Current Diagnosis: Patient Active Problem List   Diagnosis Date Noted  . Type 2 diabetes mellitus with stage 3b chronic kidney disease, with long-term current use of insulin ([place]) 05/15/2019  . B12 deficiency 05/15/2019  . Osteopenia 10/09/2018  . Gastroesophageal reflux disease without esophagitis 08/06/2018  . Vitamin D deficiency 08/06/2018  . Chronic gout due to renal impairment of multiple sites without tophus 08/06/2018  . Depression, major, single episode, in partial remission ([place]) 05/09/2017  . Insomnia 10/31/2016  . Mass of lower outer quadrant of left breast 07/03/2016  . MGUS (monoclonal gammopathy of unknown significance) 01/04/2016  . Colonic diverticular abscess 08/12/2015  . Allergic rhinitis 11/04/2014  . Hyperlipidemia associated with type 2 diabetes mellitus ([place]) 08/12/2014  . Hypertension associated with diabetes [facility]) 08/12/2014    04-04-2020: Called patient to perform a disease state review call related to the patient's diabetes. No answer. Left HIPAA compliant voicemail requesting a call back.   04-06-20: Second attempt to contact patient. Again no answer. Left  voicemail requesting a call back.   04-08-20: Third attempt no answer. Left voicemail requesting a call back. PharmD made aware of not being able to contact the patient.   Cloretta Ned, LPN Clinical Pharmacist Assistant  [phone]   Follow-Up:  PharmD visit scheduled for 04-20-20

## 2020-04-06 ENCOUNTER — Other Ambulatory Visit: Payer: Self-pay | Admitting: General Surgery

## 2020-04-13 DIAGNOSIS — E119 Type 2 diabetes mellitus without complications: Secondary | ICD-10-CM | POA: Diagnosis not present

## 2020-04-13 LAB — HM DIABETES EYE EXAM

## 2020-04-20 ENCOUNTER — Telehealth: Payer: Self-pay

## 2020-04-20 NOTE — Chronic Care Management (AMB) (Incomplete)
Chronic Care Management Pharmacy  Name: Kara Leach  MRN: [id number] DOB: June 07, 1950   Chief Complaint/ HPI  Kara Leach,  70 y.o. , female presents for their Follow-Up CCM visit with the clinical pharmacist via telephone.  PCP : Kara Lick, NP Patient Care Team: Kara Lick, NP as PCP - General (Nurse Practitioner) Kara Bellow, MD (General Surgery) Kara Legato, MD (Internal Medicine) Kara Kail, MD (Inactive) (Orthopedic Surgery) Kara Huger, MD as Consulting Physician (Oncology) Kara Leach, [facility] (Pharmacist)  Their chronic conditions include: Hypertension, Hyperlipidemia, Diabetes, GERD, Chronic Kidney Disease, Osteopenia, Gout, Allergic Rhinitis and Vitamin D/B12 deficiency   Office Visits: 02/25/20- Kara Guarneri, DNP-bloodwork, WBC/lymphocytes elevated. Recheck 4 weekls 9/3/21[name], NP- bloodword, recommend decreasing Levemir  3 units q 3 d for BS <130  Consult Visit: 03/03/20- Dr. Holley Leach, nephrology- reestablish care- blood work, PTH Allergies  Allergen Reactions  . Strawberry Extract Swelling    Lips swell and rash/whelps over body Difficulty breathing  . Ibuprofen Other (See Comments)    "ringing in ears", dizziness Also, Advil / Aleve / Motrin    Medications: Outpatient Encounter Medications as of 04/20/2020  Medication Sig  . acetaminophen (TYLENOL) 650 MG CR tablet Take 650 mg by mouth every 8 (eight) hours as needed for pain.  Marland Kitchen allopurinol (ZYLOPRIM) 100 MG tablet Take 1 tablet (100 mg total) by mouth daily.  Marland Kitchen amLODipine (NORVASC) 5 MG tablet Take 1 tablet (5 mg total) by mouth daily.  Marland Kitchen aspirin EC 81 MG tablet Take 81 mg by mouth daily.  . BD PEN NEEDLE NANO U/F 32G X 4 MM MISC USE EVERY MORNING  . Blood Glucose Monitoring Suppl (ONETOUCH VERIO REFLECT) w/Device KIT USE TO CHECK BLOOD SUGAR DAILY  . Calcium Carb-Cholecalciferol (CALCIUM 600 + D PO) Take 1 tablet by mouth 2 (two) times daily.  1000 IU VITAMIN D  . enalapril (VASOTEC) 2.5 MG tablet Take 1 tablet (2.5 mg total) by mouth daily.  . Famotidine (PEPCID PO) Take 10 mg by mouth 2 (two) times daily.   . fluticasone (FLONASE) 50 MCG/ACT nasal spray PLACE 2 SPRAYS INTO BOTH NOSTRILS DAILY AS NEEDED FOR ALLERGIES.  Marland Kitchen insulin aspart (NOVOLOG FLEXPEN) 100 UNIT/ML FlexPen INJECT 16U UNDER THE SKIN 3 TIMES DAILY WITH MEALS  . insulin detemir (LEVEMIR FLEXTOUCH) 100 UNIT/ML FlexPen INJECT 50 UNITS INTO THE SKIN DAILY AT 10 PM.  . Lancets (ONETOUCH ULTRASOFT) lancets USE TO CHECK BLOOD SUGAR DAILY  . liraglutide (VICTOZA) 18 MG/3ML SOPN ADMINISTER 1.2MG UNDER THE SKIN DAILY  . metoprolol succinate (TOPROL-XL) 25 MG 24 hr tablet Take 1 tablet (25 mg total) by mouth daily.  Glory Rosebush ULTRA test strip USE TO CHECK BLOOD SUGAR DAILY  . sertraline (ZOLOFT) 100 MG tablet Take 1 tablet (100 mg total) by mouth every morning.  . simvastatin (ZOCOR) 40 MG tablet TAKE 1 TABLET BY MOUTH EVERYDAY AT BEDTIME  . Specialty Vitamins Products (VITA-RX DIABETIC VITAMIN) CAPS Take 1 packet by mouth daily. Diabetic Vitamins - 6 or 7 in 1 pack   No facility-administered encounter medications on file as of 04/20/2020.    Goals Addressed   None     Diabetes with stage 3b CKD   A1c goal <7%  Recent Relevant Labs: Lab Results  Component Value Date/Time   HGBA1C 6.1 02/25/2020 09:36 AM   HGBA1C 6.7 11/20/2019 10:01 AM   HGBA1C 13.4 (H) 11/12/2013 12:21 PM   MICROALBUR 30 (H) 05/15/2019 08:58 AM  MICROALBUR 10 08/06/2018 08:16 AM    Last diabetic Eye exam:  Lab Results  Component Value Date/Time   HMDIABEYEEXA No Retinopathy 04/13/2020 12:00 AM    Last diabetic Foot exam:  Lab Results  Component Value Date/Time   HMDIABFOOTEX normal 08/12/2014 12:00 AM    CMP Latest Ref Rng & Units 02/25/2020 01/20/2020 11/20/2019  Glucose 65 - 99 mg/dL 161(H) 117(H) 90  BUN 8 - 27 mg/dL 15 19 19   Creatinine 0.57 - 1.00 mg/dL 1.30(H) 1.47(H) 1.06(H)   Sodium 134 - 144 mmol/L 142 140 141  Potassium 3.5 - 5.2 mmol/L 4.2 4.3 4.5  Chloride 96 - 106 mmol/L 103 104 103  CO2 20 - 29 mmol/L 22 27 24   Calcium 8.7 - 10.3 mg/dL 9.5 9.8 10.0  Total Protein 6.0 - 8.5 g/dL 7.1 - -  Total Bilirubin 0.0 - 1.2 mg/dL 0.4 - -  Alkaline Phos 44 - 121 IU/L 108 - -  AST 0 - 40 IU/L 42(H) - -  ALT 0 - 32 IU/L 31 - -   Microalbumin/creatinine ratio: 5  03/03/20   Checking BG:2- 3x per Day  Recent FBG Readings: 104-112, one reading of 89 Recent 2hr PP BG readings:  After supper 138-155   Patient has failed these meds in past: Januvia, metformin ( GI issues) Patient is currently controlled on the following medications:  Insulin detemir 50 u q 10 pm  liraglutide 1.6m qd  novolog 16 u tid meals  We discussed: diet and exercise extensively and how to recognize and treat signs of hypoglycemia. She reports eating a daily snack around 3-4 pm to avoid the afternoon lows. She reports that she can't go longer than 5 hours without eating or she will experience hypoglycemia.  She also reports weight loss of ~13 lbs since around January. She reports cutting out fried foods , limiting starches and drinking water "all day long" since her bout with dehydration/ARF. She has gotten a new glucometer One Touch R[place]ie and loves it. She reports occasional episode of "pins and needles" in her feet. She attributes this to "years of standing on concrete floors all day" working at [employer] correlates episodes to days after spending a lot of time on her feet. We discussed elevating her legs and checking her feet daily. Reduce Levemir by 3 units every 3 days Plan Continue current medications.   Hypertension associated with diabetes/ CKD stage 3   BP goal is:  <140/90  Office blood pressures are  BP Readings from Last 3 Encounters:  02/25/20 106/68  02/15/20 127/73  01/26/20 127/73   Her most recent EGFR = 558mmin BMP Latest Ref Rng & Units 02/25/2020 01/20/2020  11/20/2019  Glucose 65 - 99 mg/dL 161(H) 117(H) 90  BUN 8 - 27 mg/dL 15 19 19   Creatinine 0.57 - 1.00 mg/dL 1.30(H) 1.47(H) 1.06(H)  BUN/Creat Ratio 12 - 28 12 - 18  Sodium 134 - 144 mmol/L 142 140 141  Potassium 3.5 - 5.2 mmol/L 4.2 4.3 4.5  Chloride 96 - 106 mmol/L 103 104 103  CO2 20 - 29 mmol/L 22 27 24   Calcium 8.7 - 10.3 mg/dL 9.5 9.8 10.0  eGFR~4367min Wrist monitor home readings< 130/80   Patient has failed these meds in the past: None Patient is currently controlled on the following medications:  . Amlodipine 5 mg qd . Enalapril 2.5 mg qd . Metoprolol succinate 25 mg qd  We discussed diet and exercise extensively. Taking metoprolol at night. Reestabllished with Dr. LatHolley Leach  nephrology 02/22/20.    Plan  Recommend continue current medications .      Hyperlipidemia/ H/O cerebral infarct   LDL goal < 70  Lipid Panel     Component Value Date/Time   CHOL 137 02/25/2020 0938   CHOL 152 08/06/2018 0816   CHOL 242 (H) 11/12/2013 0745   TRIG 163 (H) 02/25/2020 0938   TRIG 343 (H) 08/06/2018 0816   TRIG 1,562 (H) 11/12/2013 0745   HDL 40 02/25/2020 0938   HDL 22 (L) 11/12/2013 0745   LDLCALC 69 02/25/2020 0938   LDLCALC SEE COMMENT 11/12/2013 0745    Hepatic Function Latest Ref Rng & Units 02/25/2020 05/15/2019 02/11/2019  Total Protein 6.0 - 8.5 g/dL 7.1 7.5 7.5  Albumin 3.8 - 4.8 g/dL 4.4 4.4 4.7  AST 0 - 40 IU/L 42(H) 116(H) 45(H)  ALT 0 - 32 IU/L 31 52(H) 25  Alk Phosphatase 44 - 121 IU/L 108 114 118(H)  Total Bilirubin 0.0 - 1.2 mg/dL 0.4 0.3 0.4     The 10-year ASCVD risk score [name], et al., 2013) is: 17.1%   Values used to calculate the score:     Age: 46 years     Sex: Female     Is Non-Hispanic African American: No     Diabetic: Yes     Tobacco smoker: No     Systolic Blood Pressure: 098 mmHg     Is BP treated: Yes     HDL Cholesterol: 40 mg/dL     Total Cholesterol: 137 mg/dL   Patient has failed these meds in past: None Patient is  currently controlled on the following medications:  . Simvastatin 40 mg qhs  We discussed:  diet and exercise extensively. Mrs. Cunliffe has cut out fried food from her diet and limits starches. She has a flower garden which she enjoys working in and walks dogs when it isn't too hot. She has ankle and hand weight which she uses 2-3 times per week.  Plan  Recommend continue current medications.   Osteopenia / Osteoporosis   Last DEXA Scan: 10/09/18  T-Score femoral neck: -2.0  T-Score lumbar spine: -2.1  10-year probability of major osteoporotic fracture: 16.9%  10-year probability of hip fracture: 2.5%  Vit D, 25-Hydroxy  Date Value Ref Range Status  02/25/2020 46.8 30.0 - 100.0 ng/mL Final    Comment:    Vitamin D deficiency has been defined by the Institute of Medicine and an Endocrine Society practice guideline as a level of serum 25-OH vitamin D less than 20 ng/mL (1,2). The Endocrine Society went on to further define vitamin D insufficiency as a level between 21 and 29 ng/mL (2). 1. IOM (Institute of Medicine). 2010. Dietary reference    intakes for calcium and D. [place]: The    [employer]. 2. Holick MF, Binkley Ramona, Bischoff-Ferrari HA, et al.    Evaluation, treatment, and prevention of vitamin D    deficiency: an Endocrine Society clinical practice    guideline. JCEM. 2011 Jul; 96(7):1911-30.      Patient is not a candidate for pharmacologic treatment  Patient has failed these meds in past: nA Patient is currently controlled on the following medications:   Calcium/ Vitamin D bid  We discussed:  Recommend [phone] units of vitamin D daily. Recommend 1200 mg of calcium daily from dietary and supplemental sources. Recommend weight-bearing and muscle strengthening exercises for building and maintaining bone density.  Plan  Continue control with diet and  exercise  Vaccines   Reviewed and discussed patient's vaccination history.    Immunization  History  Administered Date(s) Administered  . Fluad Quad(high Dose 65+) 02/11/2019, 11/20/2019  . Influenza,inj,Quad PF,6+ Mos 11/01/2016, 11/13/2017  . Influenza-Unspecified 12/20/2014, 12/28/2015  . PFIZER(Purple Top)SARS-COV-2 Vaccination 05/13/2019, 06/03/2019, 01/20/2020  . Pneumococcal Conjugate-13 02/12/2018  . Pneumococcal Polysaccharide-23 02/14/2009, 08/12/2019  . Td 06/24/2008  . Tdap 01/08/2011  . Zoster 12/28/2015  . Zoster Recombinat (Shingrix) 10/13/2018    Plan  Recommended patient receive *** vaccine in *** office.    Medication Management   Pt uses [facility] for all medications Pt endorses 95 % compliance  We discussed: Mrs. Tadros has had great experiences at [facility] and enjoys getting out of the house and going to the pharmacy.  Plan  Continue current medication management strategy  Follow up: 3 month phone visit  Junita Push. Kenton Kingfisher PharmD, [facility] [phone]

## 2020-04-28 ENCOUNTER — Ambulatory Visit
Admission: RE | Admit: 2020-04-28 | Discharge: 2020-04-28 | Disposition: A | Discharge status: Home / Self Care | Payer: Medicare HMO | Source: Ambulatory Visit | Attending: General Surgery | Admitting: General Surgery

## 2020-04-28 ENCOUNTER — Other Ambulatory Visit: Payer: Self-pay

## 2020-04-28 DIAGNOSIS — Z1231 Encounter for screening mammogram for malignant neoplasm of breast: Secondary | ICD-10-CM | POA: Insufficient documentation

## 2020-04-28 DIAGNOSIS — N6092 Unspecified benign mammary dysplasia of left breast: Secondary | ICD-10-CM | POA: Insufficient documentation

## 2020-05-02 ENCOUNTER — Telehealth: Payer: Self-pay

## 2020-05-02 NOTE — Telephone Encounter (Signed)
Left message for patient to return call- normal Mammogram follow up with Dr.Pabon 05/30/20 @ 10:45.

## 2020-05-05 DIAGNOSIS — N6092 Unspecified benign mammary dysplasia of left breast: Secondary | ICD-10-CM | POA: Diagnosis not present

## 2020-05-20 ENCOUNTER — Encounter: Payer: Self-pay | Admitting: Nurse Practitioner

## 2020-05-25 ENCOUNTER — Other Ambulatory Visit: Payer: Self-pay

## 2020-05-25 ENCOUNTER — Encounter: Payer: Self-pay | Admitting: Nurse Practitioner

## 2020-05-25 ENCOUNTER — Ambulatory Visit (INDEPENDENT_AMBULATORY_CARE_PROVIDER_SITE_OTHER): Payer: Medicare HMO | Admitting: Nurse Practitioner

## 2020-05-25 VITALS — BP 107/57 | HR 94 | Temp 97.9°F | Wt 149.8 lb

## 2020-05-25 DIAGNOSIS — E6609 Other obesity due to excess calories: Secondary | ICD-10-CM | POA: Diagnosis not present

## 2020-05-25 DIAGNOSIS — I152 Hypertension secondary to endocrine disorders: Secondary | ICD-10-CM

## 2020-05-25 DIAGNOSIS — F324 Major depressive disorder, single episode, in partial remission: Secondary | ICD-10-CM | POA: Diagnosis not present

## 2020-05-25 DIAGNOSIS — N6323 Unspecified lump in the left breast, lower outer quadrant: Secondary | ICD-10-CM | POA: Diagnosis not present

## 2020-05-25 DIAGNOSIS — Z6830 Body mass index (BMI) 30.0-30.9, adult: Secondary | ICD-10-CM

## 2020-05-25 DIAGNOSIS — N183 Chronic kidney disease, stage 3 unspecified: Secondary | ICD-10-CM

## 2020-05-25 DIAGNOSIS — D472 Monoclonal gammopathy: Secondary | ICD-10-CM | POA: Diagnosis not present

## 2020-05-25 DIAGNOSIS — Z794 Long term (current) use of insulin: Secondary | ICD-10-CM

## 2020-05-25 DIAGNOSIS — E1159 Type 2 diabetes mellitus with other circulatory complications: Secondary | ICD-10-CM

## 2020-05-25 DIAGNOSIS — E1169 Type 2 diabetes mellitus with other specified complication: Secondary | ICD-10-CM | POA: Diagnosis not present

## 2020-05-25 DIAGNOSIS — N1832 Chronic kidney disease, stage 3b: Secondary | ICD-10-CM | POA: Diagnosis not present

## 2020-05-25 DIAGNOSIS — R69 Illness, unspecified: Secondary | ICD-10-CM | POA: Diagnosis not present

## 2020-05-25 DIAGNOSIS — E785 Hyperlipidemia, unspecified: Secondary | ICD-10-CM

## 2020-05-25 DIAGNOSIS — E1122 Type 2 diabetes mellitus with diabetic chronic kidney disease: Secondary | ICD-10-CM | POA: Diagnosis not present

## 2020-05-25 LAB — BAYER DCA HB A1C WAIVED: HB A1C (BAYER DCA - WAIVED): 6.5 % (ref <7.0)

## 2020-05-25 NOTE — Assessment & Plan Note (Signed)
Continue to collaborate and review records from Dr. Bary Castilla.

## 2020-05-25 NOTE — Assessment & Plan Note (Signed)
BMI 30.20 with weight loss presenting, she has been working on diet and regular activity.  Praised for this.  Recommended eating smaller high protein, low fat meals more frequently and exercising 30 mins a day 5 times a week with a goal of 10-15lb weight loss in the next 3 months. Patient voiced their understanding and motivation to adhere to these recommendations.

## 2020-05-25 NOTE — Assessment & Plan Note (Signed)
Chronic, stable.  Denies SI/HI.  Continue current medication regimen, Sertraline 100 MG daily, and adjust as needed. Recommend meditation and relaxation techniques at home. 

## 2020-05-25 NOTE — Progress Notes (Signed)
BP (!) 107/57   Pulse 94   Temp 97.9 F (36.6 C) (Oral)   Wt 149 lb 12.8 oz (67.9 kg)   LMP  (LMP Unknown)   SpO2 98%   BMI 30.20 kg/m    Subjective:    Patient ID: Kara Leach, female    DOB: 1950/11/04, 70 y.o.   MRN: [id number]  HPI: Kara Leach is a 70 y.o. female  Chief Complaint  Patient presents with  . Diabetes  . Hyperlipidemia  . Hypertension  . Mood  . Follow-up    Patient denies having any problems or concerns. Patient states she needs refills on medications   DIABETES Last A1C 6.1%.  Continues on Novolog pre meal and Levemir + Victoza 1.2 MG daily.  Was recommended last visit to reduce Levemir by 3 units every 3 days if BS at goal in morning <130. Hypoglycemic episodes:no Polydipsia/polyuria: no Visual disturbance: no Chest pain: no Paresthesias: no Glucose Monitoring: yes  Accucheck frequency: BID  Fasting glucose: 124 this morning, averaging 105 -119  Post prandial:  Evening: average 150-160  Before meals: Taking Insulin?: yes  Long acting insulin: Levemir 50 units  Short acting insulin: Novolog 14 units Blood Pressure Monitoring: daily Retinal Examination: Up To Date Foot Exam: Up to Date Pneumovax: Up to Date Influenza: Up to Date Aspirin: yes   HYPERTENSION / HYPERLIPIDEMIA Continues on Amlodipine, Enalapril, Metoprolol, and ASA + Simvastatin. Satisfied with current treatment? yes Duration of hypertension: chronic BP monitoring frequency: daily BP range:  100-110/70's BP medication side effects: no Duration of hyperlipidemia: chronic Cholesterol medication side effects: no Cholesterol supplements: none Medication compliance: good compliance Aspirin: yes Recent stressors: no Recurrent headaches: no Visual changes: no Palpitations: no Dyspnea: no Chest pain: no Lower extremity edema: no Dizzy/lightheaded: no   CHRONIC KIDNEY DISEASE With GFR ranging from 44-56 over past two year.  Last saw nephrology on 03/03/2020 --  CRT 1.23, GFR 45, BUN 24, H/H 12.6/36.8, PTH 25. CKD status: stable Medications renally dose: yes Previous renal evaluation: yes Pneumovax:  Up to Date Influenza Vaccine:  Up to Date   MGUS: Last saw Dr. Grayland Ormond on 01/26/2020 with numbers remaining stable. She reports they are going up, but very slowly.  Sees Dr. Grayland Ormond every 6 months.  Saw Dr. Bary Castilla on 05/05/20 for follow-up on breasts and reports she got good report.  DEPRESSION Continues on Sertraline 100 MG daily.  She endorses winter and fall are difficult months for her.   Mood status: stable Satisfied with current treatment?: yes Symptom severity: mild  Duration of current treatment : chronic Side effects: no Medication compliance: good compliance Psychotherapy/counseling: none Depressed mood: no Anxious mood: no Anhedonia: no Significant weight loss or gain: no Insomnia: ocasional Fatigue: no Feelings of worthlessness or guilt: no Impaired concentration/indecisiveness: no Suicidal ideations: no Hopelessness: no Crying spells: no Depression screen [facility] 2/9 05/25/2020 02/25/2020 02/15/2020 11/20/2019 08/12/2019  Decreased Interest 0 1 0 2 0  Down, Depressed, Hopeless 3 1 3 2 1   PHQ - 2 Score 3 2 3 4 1   Altered sleeping 3 3 0 3 1  Tired, decreased energy 3 1 1 1 1   Change in appetite 0 0 0 1 0  Feeling bad or failure about yourself  1 1 1 2  0  Trouble concentrating 0 0 0 0 0  Moving slowly or fidgety/restless 0 0 0 0 0  Suicidal thoughts 0 0 0 0 0  PHQ-9 Score 10 7 5 11  3  Difficult doing work/chores - - Somewhat difficult Not difficult at all Not difficult at all  Some recent data might be hidden    Relevant past medical, surgical, family and social history reviewed and updated as indicated. Interim medical history since our last visit reviewed. Allergies and medications reviewed and updated.  Review of Systems  Constitutional: Negative for activity change, appetite change, diaphoresis, fatigue and fever.   Respiratory: Negative for cough, chest tightness and shortness of breath.   Cardiovascular: Negative for chest pain, palpitations and leg swelling.  Gastrointestinal: Negative.   Endocrine: Negative for cold intolerance, heat intolerance, polydipsia, polyphagia and polyuria.  Neurological: Negative.   Psychiatric/Behavioral: Negative.     Per HPI unless specifically indicated above     Objective:    BP (!) 107/57   Pulse 94   Temp 97.9 F (36.6 C) (Oral)   Wt 149 lb 12.8 oz (67.9 kg)   LMP  (LMP Unknown)   SpO2 98%   BMI 30.20 kg/m   Wt Readings from Last 3 Encounters:  05/25/20 149 lb 12.8 oz (67.9 kg)  02/25/20 153 lb 6.4 oz (69.6 kg)  02/15/20 153 lb (69.4 kg)    Physical Exam Vitals and nursing note reviewed.  Constitutional:      General: She is awake. She is not in acute distress.    Appearance: She is well-developed and well-groomed. She is obese. She is not ill-appearing.  HENT:     Head: Normocephalic.     Right Ear: Hearing normal.     Left Ear: Hearing normal.  Eyes:     General: Lids are normal.        Right eye: No discharge.        Left eye: No discharge.     Conjunctiva/sclera: Conjunctivae normal.     Pupils: Pupils are equal, round, and reactive to light.  Neck:     Thyroid: No thyromegaly.     Vascular: No carotid bruit.  Cardiovascular:     Rate and Rhythm: Normal rate and regular rhythm.     Heart sounds: Normal heart sounds. No murmur heard. No gallop.   Pulmonary:     Effort: Pulmonary effort is normal. No accessory muscle usage or respiratory distress.     Breath sounds: Normal breath sounds.  Abdominal:     General: Bowel sounds are normal.     Palpations: Abdomen is soft.     Tenderness: There is no abdominal tenderness.  Musculoskeletal:     Cervical back: Normal range of motion and neck supple.     Right lower leg: No edema.     Left lower leg: No edema.  Lymphadenopathy:     Cervical: No cervical adenopathy.  Skin:     General: Skin is warm and dry.  Neurological:     Mental Status: She is alert and oriented to person, place, and time.  Psychiatric:        Attention and Perception: Attention normal.        Mood and Affect: Mood normal.        Speech: Speech normal.        Behavior: Behavior normal. Behavior is cooperative.        Thought Content: Thought content normal.    Results for orders placed or performed in visit on 04/14/20  HM DIABETES EYE EXAM  Result Value Ref Range   HM Diabetic Eye Exam No Retinopathy No Retinopathy      Assessment & Plan:   Problem  List Items Addressed This Visit      Cardiovascular and Mediastinum   Hypertension associated with diabetes ([place])    Chronic, ongoing with CKD 3.  BP at goal home and office.  At this time will discontinue Amlodipine as BP remains well below goal and would benefit from minimization of medications.  Alert provider if BP consistently >130/80 at home and then may restart.  Continue Metoprolol and Enalapril and adjust as needed.  Focus on DASH diet.  Enalapril for kidney protection.  CMP today.  Return in 3 months.      Relevant Orders   Bayer DCA Hb A1c Waived   Comprehensive metabolic panel   Microalbumin, Urine Waived     Endocrine   Hyperlipidemia associated with type 2 diabetes mellitus (HCC)    Chronic, ongoing.  Continue current medication regimen and adjust as needed.  Lipid panel today, recent LDL 69.      Relevant Orders   Bayer DCA Hb A1c Waived   Lipid Panel w/o Chol/HDL Ratio   CKD stage 3 due to type 2 diabetes mellitus (HCC)    Chronic, ongoing.  A1C 6.5% today.  Continue Enalapril for kidney protection and continue to renal dose medications as needed.  Continue to collaborate with nephrology.      Type 2 diabetes mellitus with stage 3b chronic kidney disease, with long-term current use of insulin (HCC) - Primary    Chronic, ongoing with A1C 6.5% today and urine ALB 30 with A:C <30 at last visit -- she was unable to  obtain today.  Recommend she continue Levemir and Victoza at current doses, but cut back on pre meal insulin to 12 units at breakfast and lunch, then maintain 14 units at dinner (biggest meal) -- if increase in BS with this then return to 14 units before meals.  Will consider change to [place] and maximizing Victoza dependent on future A1C readings.  CMP today.  Continue to check BS at home daily and document.  Return in 3 months.      Relevant Orders   Bayer DCA Hb A1c Waived   Microalbumin, Urine Waived     Other   Obesity    BMI 30.20 with weight loss presenting, she has been working on diet and regular activity.  Praised for this.  Recommended eating smaller high protein, low fat meals more frequently and exercising 30 mins a day 5 times a week with a goal of 10-15lb weight loss in the next 3 months. Patient voiced their understanding and motivation to adhere to these recommendations.       MGUS (monoclonal gammopathy of unknown significance)    Continue collaboration with Dr. Grayland [facility]rmond at [facility].      Mass of lower outer quadrant of left breast    Continue to collaborate and review records from Dr. Bary Castilla.      Depression, major, single episode, in partial remission (HCC)    Chronic, stable.  Denies SI/HI.  Continue current medication regimen, Sertraline 100 MG daily, and adjust as needed. Recommend meditation and relaxation techniques at home.          Follow up plan: Return in about 3 months (around 08/25/2020) for T2DM, HTN/HLD, MOOD, OSTEOPENIA.

## 2020-05-25 NOTE — Assessment & Plan Note (Addendum)
Chronic, ongoing with CKD 3.  BP at goal home and office.  At this time will discontinue Amlodipine as BP remains well below goal and would benefit from minimization of medications.  Alert provider if BP consistently >130/80 at home and then may restart.  Continue Metoprolol and Enalapril and adjust as needed.  Focus on DASH diet.  Enalapril for kidney protection.  CMP today.  Return in 3 months.

## 2020-05-25 NOTE — Patient Instructions (Signed)
Diabetes Mellitus and Nutrition, Adult When you have diabetes, or diabetes mellitus, it is very important to have healthy eating habits because your blood sugar (glucose) levels are greatly affected by what you eat and drink. Eating healthy foods in the right amounts, at about the same times every day, can help you:  Control your blood glucose.  Lower your risk of heart disease.  Improve your blood pressure.  Reach or maintain a healthy weight. What can affect my meal plan? Every person with diabetes is different, and each person has different needs for a meal plan. Your health care provider may recommend that you work with a dietitian to make a meal plan that is best for you. Your meal plan may vary depending on factors such as:  The calories you need.  The medicines you take.  Your weight.  Your blood glucose, blood pressure, and cholesterol levels.  Your activity level.  Other health conditions you have, such as heart or kidney disease. How do carbohydrates affect me? Carbohydrates, also called carbs, affect your blood glucose level more than any other type of food. Eating carbs naturally raises the amount of glucose in your blood. Carb counting is a method for keeping track of how many carbs you eat. Counting carbs is important to keep your blood glucose at a healthy level, especially if you use insulin or take certain oral diabetes medicines. It is important to know how many carbs you can safely have in each meal. This is different for every person. Your dietitian can help you calculate how many carbs you should have at each meal and for each snack. How does alcohol affect me? Alcohol can cause a sudden decrease in blood glucose (hypoglycemia), especially if you use insulin or take certain oral diabetes medicines. Hypoglycemia can be a life-threatening condition. Symptoms of hypoglycemia, such as sleepiness, dizziness, and confusion, are similar to symptoms of having too much  alcohol.  Do not drink alcohol if: ? Your health care provider tells you not to drink. ? You are pregnant, may be pregnant, or are planning to become pregnant.  If you drink alcohol: ? Do not drink on an empty stomach. ? Limit how much you use to:  0-1 drink a day for women.  0-2 drinks a day for men. ? Be aware of how much alcohol is in your drink. In the [place] one drink equals one 12 oz bottle of beer (355 mL), one 5 oz glass of wine (148 mL), or one 1 oz glass of hard liquor (44 mL). ? Keep yourself hydrated with water, diet soda, or unsweetened iced tea.  Keep in mind that regular soda, juice, and other mixers may contain a lot of sugar and must be counted as carbs. What are tips for following this plan? Reading food labels  Start by checking the serving size on the "Nutrition Facts" label of packaged foods and drinks. The amount of calories, carbs, fats, and other nutrients listed on the label is based on one serving of the item. Many items contain more than one serving per package.  Check the total grams (g) of carbs in one serving. You can calculate the number of servings of carbs in one serving by dividing the total carbs by 15. For example, if a food has 30 g of total carbs per serving, it would be equal to 2 servings of carbs.  Check the number of grams (g) of saturated fats and trans fats in one serving. Choose foods that have   a low amount or none of these fats.  Check the number of milligrams (mg) of salt (sodium) in one serving. Most people should limit total sodium intake to less than 2,300 mg per day.  Always check the nutrition information of foods labeled as "low-fat" or "nonfat." These foods may be higher in added sugar or refined carbs and should be avoided.  Talk to your dietitian to identify your daily goals for nutrients listed on the label. Shopping  Avoid buying canned, pre-made, or processed foods. These foods tend to be high in fat, sodium, and added  sugar.  Shop around the outside edge of the grocery store. This is where you will most often find fresh fruits and vegetables, bulk grains, fresh meats, and fresh dairy. Cooking  Use low-heat cooking methods, such as baking, instead of high-heat cooking methods like deep frying.  Cook using healthy oils, such as olive, canola, or sunflower oil.  Avoid cooking with butter, cream, or high-fat meats. Meal planning  Eat meals and snacks regularly, preferably at the same times every day. Avoid going long periods of time without eating.  Eat foods that are high in fiber, such as fresh fruits, vegetables, beans, and whole grains. Talk with your dietitian about how many servings of carbs you can eat at each meal.  Eat 4-6 oz (112-168 g) of lean protein each day, such as lean meat, chicken, fish, eggs, or tofu. One ounce (oz) of lean protein is equal to: ? 1 oz (28 g) of meat, chicken, or fish. ? 1 egg. ?  cup (62 g) of tofu.  Eat some foods each day that contain healthy fats, such as avocado, nuts, seeds, and fish.   What foods should I eat? Fruits Berries. Apples. Oranges. Peaches. Apricots. Plums. Grapes. Mango. Papaya. Pomegranate. Kiwi. Cherries. Vegetables Lettuce. Spinach. Leafy greens, including kale, chard, collard greens, and mustard greens. Beets. Cauliflower. Cabbage. Broccoli. Carrots. Green beans. Tomatoes. Peppers. Onions. Cucumbers. Brussels sprouts. Grains Whole grains, such as whole-wheat or whole-grain bread, crackers, tortillas, cereal, and pasta. Unsweetened oatmeal. Quinoa. Brown or wild rice. Meats and other proteins Seafood. Poultry without skin. Lean cuts of poultry and beef. Tofu. Nuts. Seeds. Dairy Low-fat or fat-free dairy products such as milk, yogurt, and cheese. The items listed above may not be a complete list of foods and beverages you can eat. Contact a dietitian for more information. What foods should I avoid? Fruits Fruits canned with  syrup. Vegetables Canned vegetables. Frozen vegetables with butter or cream sauce. Grains Refined white flour and flour products such as bread, pasta, snack foods, and cereals. Avoid all processed foods. Meats and other proteins Fatty cuts of meat. Poultry with skin. Breaded or fried meats. Processed meat. Avoid saturated fats. Dairy Full-fat yogurt, cheese, or milk. Beverages Sweetened drinks, such as soda or iced tea. The items listed above may not be a complete list of foods and beverages you should avoid. Contact a dietitian for more information. Questions to ask a health care provider  Do I need to meet with a diabetes educator?  Do I need to meet with a dietitian?  What number can I call if I have questions?  When are the best times to check my blood glucose? Where to find more information:  American Diabetes Association: diabetes.org  Academy of Nutrition and Dietetics: www.eatright.org  National Institute of Diabetes and Digestive and Kidney Diseases: www.niddk.nih.gov  Association of Diabetes Care and Education Specialists: www.diabeteseducator.org Summary  It is important to have healthy eating   habits because your blood sugar (glucose) levels are greatly affected by what you eat and drink.  A healthy meal plan will help you control your blood glucose and maintain a healthy lifestyle.  Your health care provider may recommend that you work with a dietitian to make a meal plan that is best for you.  Keep in mind that carbohydrates (carbs) and alcohol have immediate effects on your blood glucose levels. It is important to count carbs and to use alcohol carefully. This information is not intended to replace advice given to you by your health care provider. Make sure you discuss any questions you have with your health care provider. Document Revised: 02/10/2019 Document Reviewed: 02/10/2019 Elsevier Patient Education  2021 Elsevier Inc.  

## 2020-05-25 NOTE — Assessment & Plan Note (Signed)
Chronic, ongoing with A1C 6.5% today and urine ALB 30 with A:C <30 at last visit -- she was unable to obtain today.  Recommend she continue Levemir and Victoza at current doses, but cut back on pre meal insulin to 12 units at breakfast and lunch, then maintain 14 units at dinner (biggest meal) -- if increase in BS with this then return to 14 units before meals.  Will consider change to [place] and maximizing Victoza dependent on future A1C readings.  CMP today.  Continue to check BS at home daily and document.  Return in 3 months.

## 2020-05-25 NOTE — Assessment & Plan Note (Signed)
Chronic, ongoing.  A1C 6.5% today.  Continue Enalapril for kidney protection and continue to renal dose medications as needed.  Continue to collaborate with nephrology.

## 2020-05-25 NOTE — Assessment & Plan Note (Signed)
Continue collaboration with Dr. Grayland Ormond at [facility].

## 2020-05-25 NOTE — Assessment & Plan Note (Signed)
Chronic, ongoing.  Continue current medication regimen and adjust as needed.  Lipid panel today, recent LDL 69.

## 2020-05-26 LAB — COMPREHENSIVE METABOLIC PANEL
ALT: 36 IU/L — ABNORMAL HIGH (ref 0–32)
AST: 79 IU/L — ABNORMAL HIGH (ref 0–40)
Albumin/Globulin Ratio: 1.6 (ref 1.2–2.2)
Albumin: 4.7 g/dL (ref 3.8–4.8)
Alkaline Phosphatase: 118 IU/L (ref 44–121)
BUN/Creatinine Ratio: 14 (ref 12–28)
BUN: 17 mg/dL (ref 8–27)
Bilirubin Total: 0.3 mg/dL (ref 0.0–1.2)
CO2: 22 mmol/L (ref 20–29)
Calcium: 10.1 mg/dL (ref 8.7–10.3)
Chloride: 102 mmol/L (ref 96–106)
Creatinine, Ser: 1.24 mg/dL — ABNORMAL HIGH (ref 0.57–1.00)
Globulin, Total: 2.9 g/dL (ref 1.5–4.5)
Glucose: 170 mg/dL — ABNORMAL HIGH (ref 65–99)
Potassium: 4.7 mmol/L (ref 3.5–5.2)
Sodium: 141 mmol/L (ref 134–144)
Total Protein: 7.6 g/dL (ref 6.0–8.5)
eGFR: 48 mL/min/{1.73_m2} — ABNORMAL LOW (ref >59)

## 2020-05-26 LAB — LIPID PANEL W/O CHOL/HDL RATIO
Cholesterol, Total: 139 mg/dL (ref 100–199)
HDL: 42 mg/dL (ref >39)
LDL Chol Calc (NIH): 67 mg/dL (ref 0–99)
Triglycerides: 175 mg/dL — ABNORMAL HIGH (ref 0–149)
VLDL Cholesterol Cal: 30 mg/dL (ref 5–40)

## 2020-05-26 NOTE — Progress Notes (Signed)
Good afternoon, please let Jazira know labs have returned: - Kidney function continues to show stable kidney disease with no decline. We will continue to monitor. - Liver function testing is showing mild elevation this check, we will recheck next visit and if any increase may consider getting ultrasound of liver.  Recommend avoiding alcohol use and minimize Tylenol intake, only use if needed.   - Cholesterol labs show LDL at goal.  Any questions? Keep being awesome!!  Thank you for allowing me to participate in your care. Kindest regards, Jolene

## 2020-05-30 ENCOUNTER — Ambulatory Visit: Payer: Self-pay | Admitting: Surgery

## 2020-06-14 ENCOUNTER — Telehealth: Payer: Self-pay | Admitting: Pharmacist

## 2020-06-14 NOTE — Chronic Care Management (AMB) (Signed)
    Chronic Care Management Pharmacy Assistant   Name: Kara Leach  MRN: [id number] DOB: 08/29/1950  Reason for Encounter: Schedule Appointment    Medications: Outpatient Encounter Medications as of 06/14/2020  Medication Sig  . acetaminophen (TYLENOL) 650 MG CR tablet Take 650 mg by mouth every 8 (eight) hours as needed for pain.  Marland Kitchen allopurinol (ZYLOPRIM) 100 MG tablet Take 1 tablet (100 mg total) by mouth daily.  Marland Kitchen aspirin EC 81 MG tablet Take 81 mg by mouth daily.  . BD PEN NEEDLE NANO U/F 32G X 4 MM MISC USE EVERY MORNING  . Blood Glucose Monitoring Suppl (ONETOUCH VERIO REFLECT) w/Device KIT USE TO CHECK BLOOD SUGAR DAILY  . Calcium Carb-Cholecalciferol (CALCIUM 600 + D PO) Take 1 tablet by mouth 2 (two) times daily. 1000 IU VITAMIN D  . enalapril (VASOTEC) 2.5 MG tablet Take 1 tablet (2.5 mg total) by mouth daily.  . Famotidine (PEPCID PO) Take 10 mg by mouth 2 (two) times daily.   . fluticasone (FLONASE) 50 MCG/ACT nasal spray PLACE 2 SPRAYS INTO BOTH NOSTRILS DAILY AS NEEDED FOR ALLERGIES.  Marland Kitchen insulin aspart (NOVOLOG FLEXPEN) 100 UNIT/ML FlexPen INJECT 16U UNDER THE SKIN 3 TIMES DAILY WITH MEALS  . insulin detemir (LEVEMIR FLEXTOUCH) 100 UNIT/ML FlexPen INJECT 50 UNITS INTO THE SKIN DAILY AT 10 PM.  . Lancets (ONETOUCH ULTRASOFT) lancets USE TO CHECK BLOOD SUGAR DAILY  . liraglutide (VICTOZA) 18 MG/3ML SOPN ADMINISTER 1.$RemoveBeforeDEI'2MG'[id number]$  UNDER THE SKIN DAILY  . metoprolol succinate (TOPROL-XL) 25 MG 24 hr tablet Take 1 tablet (25 mg total) by mouth daily.  Glory Rosebush ULTRA test strip USE TO CHECK BLOOD SUGAR DAILY  . sertraline (ZOLOFT) 100 MG tablet Take 1 tablet (100 mg total) by mouth every morning.  . simvastatin (ZOCOR) 40 MG tablet TAKE 1 TABLET BY MOUTH EVERYDAY AT BEDTIME  . Specialty Vitamins Products (VITA-RX DIABETIC VITAMIN) CAPS Take 1 packet by mouth daily. Diabetic Vitamins - 6 or 7 in 1 pack   No facility-administered encounter medications on file as of 06/14/2020.    Attempted to contact the patient to schedule appointment with Birdena Crandall, CPP. I left a message for the patient to return my call.  April D Calhoun, [place] Pharmacist Assistant [phone]

## 2020-08-12 ENCOUNTER — Other Ambulatory Visit: Payer: Self-pay | Admitting: Nurse Practitioner

## 2020-08-13 ENCOUNTER — Other Ambulatory Visit: Payer: Self-pay | Admitting: Nurse Practitioner

## 2020-08-13 NOTE — Telephone Encounter (Signed)
Requested medications are due for refill today yes  Requested medications are on the active medication list yes  Last refill 4/29  Last visit 02/2020  Future visit scheduled 08/25/20  Notes to clinic OV note states to change to 12u then increase to 14u, this rx is for 16u, please assess.

## 2020-08-16 NOTE — Telephone Encounter (Signed)
Scheduled 6/9

## 2020-08-16 NOTE — Telephone Encounter (Signed)
Routing to provider  

## 2020-08-23 ENCOUNTER — Telehealth: Payer: Self-pay | Admitting: Pharmacist

## 2020-08-23 DIAGNOSIS — Z9229 Personal history of other drug therapy: Secondary | ICD-10-CM

## 2020-08-23 NOTE — Progress Notes (Signed)
                                          [facility])                                            [place] Team                                        Statin Quality Measure Assessment    08/23/2020  Kara Leach [birth year] [id number]   Per review of chart and payor information, patient has a diagnosis of diabetes but is not currently filling a statin prescription.  This places patient into the SUPD (Statin Use In Patients with Diabetes) measure for CMS.    Simvastatin was last filled 02/25/20.  Patient was called to discuss but she did not answer the phone. HIPAA compliant message was left on her voicemail.   The 10-year ASCVD risk score Kara Leach DC Jr., et al., 2013) is: 11.4%   Values used to calculate the score:     Age: 70 years     Sex: Female     Is Non-Hispanic African American: No     Diabetic: Yes     Tobacco smoker: No     Systolic Blood Pressure: 784 mmHg     Is BP treated: Yes     HDL Cholesterol: 42 mg/dL     Total Cholesterol: 139 mg/dL 05/25/2020     Component Value Date/Time   CHOL 139 05/25/2020 0957   CHOL 152 08/06/2018 0816   CHOL 242 (H) 11/12/2013 0745   TRIG 175 (H) 05/25/2020 0957   TRIG 343 (H) 08/06/2018 0816   TRIG 1,562 (H) 11/12/2013 0745   HDL 42 05/25/2020 0957   HDL 22 (L) 11/12/2013 0745   CHOLHDL 3.9 11/13/2017 1056   VLDL 69 (H) 08/06/2018 0816   VLDL SEE COMMENT 11/12/2013 0745   LDLCALC 67 05/25/2020 0957   LDLCALC SEE COMMENT 11/12/2013 0745    Please consider ONE of the following recommendations:  ? Initiate high intensity statin Atorvastatin 40mg  once daily, #90, 3 refills   Rosuvastatin 20mg  once daily, #90, 3 refills    ? Initiate moderate intensity          statin with reduced frequency if prior          statin intolerance 1x weekly, #13, 3 refills   2x weekly, #26, 3 refills   3x weekly, #39, 3 refills    ? Code for past statin intolerance or  other  exclusions (required annually)  Provider Requirements: ? Associate code during an office visit or telehealth encounter  Drug Induced Myopathy G72.0   Myopathy, unspecified G72.9   Myositis, unspecified M60.9   Rhabdomyolysis O96.29   Alcoholic fatty liver B28.4   Cirrhosis of liver K74.69   Prediabetes R73.03   PCOS E28.2   Toxic liver disease, unspecified K71.9   Adverse effect of antihyperlipidemic and antiarteriosclerotic drugs, initial encounter X32.4M0N    Plan: Route note to Provider Cannady prior to the patient's 08/25/20 office visit.   Kara Leach, PharmD, [place] Clinical Pharmacist [phone]

## 2020-08-25 ENCOUNTER — Ambulatory Visit (INDEPENDENT_AMBULATORY_CARE_PROVIDER_SITE_OTHER): Payer: Medicare HMO | Admitting: Nurse Practitioner

## 2020-08-25 ENCOUNTER — Encounter: Payer: Self-pay | Admitting: Nurse Practitioner

## 2020-08-25 ENCOUNTER — Other Ambulatory Visit: Payer: Self-pay

## 2020-08-25 VITALS — BP 134/81 | HR 82 | Temp 98.1°F | Wt 150.6 lb

## 2020-08-25 DIAGNOSIS — E785 Hyperlipidemia, unspecified: Secondary | ICD-10-CM | POA: Diagnosis not present

## 2020-08-25 DIAGNOSIS — I152 Hypertension secondary to endocrine disorders: Secondary | ICD-10-CM

## 2020-08-25 DIAGNOSIS — E1122 Type 2 diabetes mellitus with diabetic chronic kidney disease: Secondary | ICD-10-CM

## 2020-08-25 DIAGNOSIS — E6609 Other obesity due to excess calories: Secondary | ICD-10-CM

## 2020-08-25 DIAGNOSIS — Z794 Long term (current) use of insulin: Secondary | ICD-10-CM | POA: Diagnosis not present

## 2020-08-25 DIAGNOSIS — F324 Major depressive disorder, single episode, in partial remission: Secondary | ICD-10-CM | POA: Diagnosis not present

## 2020-08-25 DIAGNOSIS — N183 Chronic kidney disease, stage 3 unspecified: Secondary | ICD-10-CM | POA: Diagnosis not present

## 2020-08-25 DIAGNOSIS — D472 Monoclonal gammopathy: Secondary | ICD-10-CM

## 2020-08-25 DIAGNOSIS — E1169 Type 2 diabetes mellitus with other specified complication: Secondary | ICD-10-CM | POA: Diagnosis not present

## 2020-08-25 DIAGNOSIS — N1832 Chronic kidney disease, stage 3b: Secondary | ICD-10-CM | POA: Diagnosis not present

## 2020-08-25 DIAGNOSIS — E1159 Type 2 diabetes mellitus with other circulatory complications: Secondary | ICD-10-CM

## 2020-08-25 DIAGNOSIS — R69 Illness, unspecified: Secondary | ICD-10-CM | POA: Diagnosis not present

## 2020-08-25 DIAGNOSIS — Z6830 Body mass index (BMI) 30.0-30.9, adult: Secondary | ICD-10-CM

## 2020-08-25 LAB — BAYER DCA HB A1C WAIVED: HB A1C (BAYER DCA - WAIVED): 6.8 % (ref <7.0)

## 2020-08-25 LAB — MICROALBUMIN, URINE WAIVED
Creatinine, Urine Waived: 100 mg/dL (ref 10–300)
Microalb, Ur Waived: 10 mg/L (ref 0–19)
Microalb/Creat Ratio: <30 mg/g (ref <30)

## 2020-08-25 NOTE — Assessment & Plan Note (Signed)
Continue collaboration with Dr. Grayland Ormond at [facility].

## 2020-08-25 NOTE — Assessment & Plan Note (Addendum)
Chronic, ongoing.  A1C 6.8% today.  Continue Enalapril for kidney protection and continue to renal dose medications as needed.  Continue to collaborate with nephrology.

## 2020-08-25 NOTE — Assessment & Plan Note (Signed)
Chronic, stable.  Denies SI/HI.  Continue current medication regimen, Sertraline 100 MG daily, and adjust as needed. Recommend meditation and relaxation techniques at home. 

## 2020-08-25 NOTE — Assessment & Plan Note (Signed)
BMI 30.36. Recommended eating smaller high protein, low fat meals more frequently and exercising 30 mins a day 5 times a week with a goal of 10-15lb weight loss in the next 3 months. Patient voiced their understanding and motivation to adhere to these recommendations.

## 2020-08-25 NOTE — Assessment & Plan Note (Signed)
Chronic, ongoing with A1C 6.8% today and urine ALB 10 today.  Recommend she continue Levemir and Victoza at current doses.  Will consider change to [place] and maximizing Victoza dependent on future A1C readings.  CMP next visit.  Continue to check BS at home daily and document.  Return in 3 months.

## 2020-08-25 NOTE — Patient Instructions (Signed)
Diabetes Mellitus and Nutrition, Adult When you have diabetes, or diabetes mellitus, it is very important to have healthy eating habits because your blood sugar (glucose) levels are greatly affected by what you eat and drink. Eating healthy foods in the right amounts, at about the same times every day, can help you:  Control your blood glucose.  Lower your risk of heart disease.  Improve your blood pressure.  Reach or maintain a healthy weight. What can affect my meal plan? Every person with diabetes is different, and each person has different needs for a meal plan. Your health care provider may recommend that you work with a dietitian to make a meal plan that is best for you. Your meal plan may vary depending on factors such as:  The calories you need.  The medicines you take.  Your weight.  Your blood glucose, blood pressure, and cholesterol levels.  Your activity level.  Other health conditions you have, such as heart or kidney disease. How do carbohydrates affect me? Carbohydrates, also called carbs, affect your blood glucose level more than any other type of food. Eating carbs naturally raises the amount of glucose in your blood. Carb counting is a method for keeping track of how many carbs you eat. Counting carbs is important to keep your blood glucose at a healthy level, especially if you use insulin or take certain oral diabetes medicines. It is important to know how many carbs you can safely have in each meal. This is different for every person. Your dietitian can help you calculate how many carbs you should have at each meal and for each snack. How does alcohol affect me? Alcohol can cause a sudden decrease in blood glucose (hypoglycemia), especially if you use insulin or take certain oral diabetes medicines. Hypoglycemia can be a life-threatening condition. Symptoms of hypoglycemia, such as sleepiness, dizziness, and confusion, are similar to symptoms of having too much  alcohol.  Do not drink alcohol if: ? Your health care provider tells you not to drink. ? You are pregnant, may be pregnant, or are planning to become pregnant.  If you drink alcohol: ? Do not drink on an empty stomach. ? Limit how much you use to:  0-1 drink a day for women.  0-2 drinks a day for men. ? Be aware of how much alcohol is in your drink. In the [place] one drink equals one 12 oz bottle of beer (355 mL), one 5 oz glass of wine (148 mL), or one 1 oz glass of hard liquor (44 mL). ? Keep yourself hydrated with water, diet soda, or unsweetened iced tea.  Keep in mind that regular soda, juice, and other mixers may contain a lot of sugar and must be counted as carbs. What are tips for following this plan? Reading food labels  Start by checking the serving size on the "Nutrition Facts" label of packaged foods and drinks. The amount of calories, carbs, fats, and other nutrients listed on the label is based on one serving of the item. Many items contain more than one serving per package.  Check the total grams (g) of carbs in one serving. You can calculate the number of servings of carbs in one serving by dividing the total carbs by 15. For example, if a food has 30 g of total carbs per serving, it would be equal to 2 servings of carbs.  Check the number of grams (g) of saturated fats and trans fats in one serving. Choose foods that have   a low amount or none of these fats.  Check the number of milligrams (mg) of salt (sodium) in one serving. Most people should limit total sodium intake to less than 2,300 mg per day.  Always check the nutrition information of foods labeled as "low-fat" or "nonfat." These foods may be higher in added sugar or refined carbs and should be avoided.  Talk to your dietitian to identify your daily goals for nutrients listed on the label. Shopping  Avoid buying canned, pre-made, or processed foods. These foods tend to be high in fat, sodium, and added  sugar.  Shop around the outside edge of the grocery store. This is where you will most often find fresh fruits and vegetables, bulk grains, fresh meats, and fresh dairy. Cooking  Use low-heat cooking methods, such as baking, instead of high-heat cooking methods like deep frying.  Cook using healthy oils, such as olive, canola, or sunflower oil.  Avoid cooking with butter, cream, or high-fat meats. Meal planning  Eat meals and snacks regularly, preferably at the same times every day. Avoid going long periods of time without eating.  Eat foods that are high in fiber, such as fresh fruits, vegetables, beans, and whole grains. Talk with your dietitian about how many servings of carbs you can eat at each meal.  Eat 4-6 oz (112-168 g) of lean protein each day, such as lean meat, chicken, fish, eggs, or tofu. One ounce (oz) of lean protein is equal to: ? 1 oz (28 g) of meat, chicken, or fish. ? 1 egg. ?  cup (62 g) of tofu.  Eat some foods each day that contain healthy fats, such as avocado, nuts, seeds, and fish.   What foods should I eat? Fruits Berries. Apples. Oranges. Peaches. Apricots. Plums. Grapes. Mango. Papaya. Pomegranate. Kiwi. Cherries. Vegetables Lettuce. Spinach. Leafy greens, including kale, chard, collard greens, and mustard greens. Beets. Cauliflower. Cabbage. Broccoli. Carrots. Green beans. Tomatoes. Peppers. Onions. Cucumbers. Brussels sprouts. Grains Whole grains, such as whole-wheat or whole-grain bread, crackers, tortillas, cereal, and pasta. Unsweetened oatmeal. Quinoa. Brown or wild rice. Meats and other proteins Seafood. Poultry without skin. Lean cuts of poultry and beef. Tofu. Nuts. Seeds. Dairy Low-fat or fat-free dairy products such as milk, yogurt, and cheese. The items listed above may not be a complete list of foods and beverages you can eat. Contact a dietitian for more information. What foods should I avoid? Fruits Fruits canned with  syrup. Vegetables Canned vegetables. Frozen vegetables with butter or cream sauce. Grains Refined white flour and flour products such as bread, pasta, snack foods, and cereals. Avoid all processed foods. Meats and other proteins Fatty cuts of meat. Poultry with skin. Breaded or fried meats. Processed meat. Avoid saturated fats. Dairy Full-fat yogurt, cheese, or milk. Beverages Sweetened drinks, such as soda or iced tea. The items listed above may not be a complete list of foods and beverages you should avoid. Contact a dietitian for more information. Questions to ask a health care provider  Do I need to meet with a diabetes educator?  Do I need to meet with a dietitian?  What number can I call if I have questions?  When are the best times to check my blood glucose? Where to find more information:  American Diabetes Association: diabetes.org  Academy of Nutrition and Dietetics: www.eatright.org  National Institute of Diabetes and Digestive and Kidney Diseases: www.niddk.nih.gov  Association of Diabetes Care and Education Specialists: www.diabeteseducator.org Summary  It is important to have healthy eating   habits because your blood sugar (glucose) levels are greatly affected by what you eat and drink.  A healthy meal plan will help you control your blood glucose and maintain a healthy lifestyle.  Your health care provider may recommend that you work with a dietitian to make a meal plan that is best for you.  Keep in mind that carbohydrates (carbs) and alcohol have immediate effects on your blood glucose levels. It is important to count carbs and to use alcohol carefully. This information is not intended to replace advice given to you by your health care provider. Make sure you discuss any questions you have with your health care provider. Document Revised: 02/10/2019 Document Reviewed: 02/10/2019 Elsevier Patient Education  2021 Elsevier Inc.  

## 2020-08-25 NOTE — Assessment & Plan Note (Signed)
Chronic, ongoing.  Continue current medication regimen and adjust as needed.  Lipid panel up to date, recent LDL <70.

## 2020-08-25 NOTE — Assessment & Plan Note (Signed)
Chronic, ongoing with CKD 3.  BP at goal home and office.  At this time will continue current medication regimen and adjust as needed.  Alert provider if BP consistently >130/80 at home and then may restart Amlodipine at 5 MG.  Continue Metoprolol and Enalapril and adjust as needed.  Focus on DASH diet.  Enalapril for kidney protection.  CMP next visit.  Return in 3 months.

## 2020-08-25 NOTE — Progress Notes (Signed)
BP 134/81   Pulse 82   Temp 98.1 F (36.7 C) (Oral)   Wt 150 lb 9.6 oz (68.3 kg)   LMP  (LMP Unknown)   SpO2 97%   BMI 30.36 kg/m    Subjective:    Patient ID: Kara Leach, female    DOB: 08-02-1950, 69 y.o.   MRN: [id number]  HPI: Kara Leach is a 70 y.o. female  Chief Complaint  Patient presents with   Hypertension   Chronic Kidney Disease   Depression   DIABETES Last A1C 6.5% in March.  Continues on Novolog pre meal and Levemir + Victoza 1.2 MG daily.  Was recommended last visit to reduce Levemir by 3 units every 3 days if BS at goal in morning <130. Hypoglycemic episodes:no Polydipsia/polyuria: no Visual disturbance: no Chest pain: no Paresthesias: no Glucose Monitoring: yes  Accucheck frequency: BID  Fasting glucose: 99 this morning, averaging 130  Post prandial:  Evening: average 150  Before meals: Taking Insulin?: yes  Long acting insulin: Levemir 50 units  Short acting insulin: Novolog 12 units in evening and 14 in morning and lunch Blood Pressure Monitoring: daily Retinal Examination: Up To Date Foot Exam: Up to Date Pneumovax: Up to Date Influenza: Up to Date Aspirin: yes   HYPERTENSION / HYPERLIPIDEMIA Continues on Enalapril, Metoprolol, and ASA + Simvastatin.  Discontinued Amlodipine in March 2022. Satisfied with current treatment? yes Duration of hypertension: chronic BP monitoring frequency: daily BP range:  100-110/70's BP medication side effects: no Duration of hyperlipidemia: chronic Cholesterol medication side effects: no Cholesterol supplements: none Medication compliance: good compliance Aspirin: yes Recent stressors: no Recurrent headaches: no Visual changes: no Palpitations: no Dyspnea: no Chest pain: no Lower extremity edema: no Dizzy/lightheaded: no   CHRONIC KIDNEY DISEASE With GFR ranging from 44-56 over past two year.  Last saw nephrology on 03/03/20 and last labs 05/25/20 -- CRT 1.24, GFR 48, BUN 17, H/H  13.8/41.1. CKD status: stable Medications renally dose: yes Previous renal evaluation: yes Pneumovax:  Up to Date Influenza Vaccine:  Up to Date   MGUS: Last saw Dr. Grayland Ormond on 01/26/2020 with numbers remaining stable. She reports they are going up, but very slowly.  Sees Dr. Grayland Ormond every 6 months.  Saw Dr. Bary Castilla on 05/05/20 for follow-up on breasts and reports she got good report.  DEPRESSION Continues on Sertraline 100 MG daily.  Mood status: stable Satisfied with current treatment?: yes Symptom severity: mild  Duration of current treatment : chronic Side effects: no Medication compliance: good compliance Psychotherapy/counseling: none Depressed mood: no Anxious mood: no Anhedonia: no Significant weight loss or gain: no Insomnia: ocasional Fatigue: no Feelings of worthlessness or guilt: no Impaired concentration/indecisiveness: no Suicidal ideations: no Hopelessness: no Crying spells: no Depression screen [facility] 2/9 08/25/2020 05/25/2020 02/25/2020 02/15/2020 11/20/2019  Decreased Interest 0 0 1 0 2  Down, Depressed, Hopeless 1 3 1 3 2   PHQ - 2 Score 1 3 2 3 4   Altered sleeping 3 3 3  0 3  Tired, decreased energy 3 3 1 1 1   Change in appetite 0 0 0 0 1  Feeling bad or failure about yourself  0 1 1 1 2   Trouble concentrating 0 0 0 0 0  Moving slowly or fidgety/restless 0 0 0 0 0  Suicidal thoughts 0 0 0 0 0  PHQ-9 Score 7 10 7 5 11   Difficult doing work/chores Somewhat difficult - - Somewhat difficult Not difficult at all  Some recent data might  be hidden    Relevant past medical, surgical, family and social history reviewed and updated as indicated. Interim medical history since our last visit reviewed. Allergies and medications reviewed and updated.  Review of Systems  Constitutional:  Negative for activity change, appetite change, diaphoresis, fatigue and fever.  Respiratory:  Negative for cough, chest tightness and shortness of breath.   Cardiovascular:  Negative for  chest pain, palpitations and leg swelling.  Gastrointestinal: Negative.   Endocrine: Negative for cold intolerance, heat intolerance, polydipsia, polyphagia and polyuria.  Neurological: Negative.   Psychiatric/Behavioral: Negative.     Per HPI unless specifically indicated above     Objective:    BP 134/81   Pulse 82   Temp 98.1 F (36.7 C) (Oral)   Wt 150 lb 9.6 oz (68.3 kg)   LMP  (LMP Unknown)   SpO2 97%   BMI 30.36 kg/m   Wt Readings from Last 3 Encounters:  08/25/20 150 lb 9.6 oz (68.3 kg)  05/25/20 149 lb 12.8 oz (67.9 kg)  02/25/20 153 lb 6.4 oz (69.6 kg)    Physical Exam Vitals and nursing note reviewed.  Constitutional:      General: She is awake. She is not in acute distress.    Appearance: She is well-developed and well-groomed. She is obese. She is not ill-appearing.  HENT:     Head: Normocephalic.     Right Ear: Hearing normal.     Left Ear: Hearing normal.  Eyes:     General: Lids are normal.        Right eye: No discharge.        Left eye: No discharge.     Conjunctiva/sclera: Conjunctivae normal.     Pupils: Pupils are equal, round, and reactive to light.  Neck:     Thyroid: No thyromegaly.     Vascular: No carotid bruit.  Cardiovascular:     Rate and Rhythm: Normal rate and regular rhythm.     Heart sounds: Normal heart sounds. No murmur heard.   No gallop.  Pulmonary:     Effort: Pulmonary effort is normal. No accessory muscle usage or respiratory distress.     Breath sounds: Normal breath sounds.  Abdominal:     General: Bowel sounds are normal.     Palpations: Abdomen is soft.     Tenderness: There is no abdominal tenderness.  Musculoskeletal:     Cervical back: Normal range of motion and neck supple.     Right lower leg: No edema.     Left lower leg: No edema.  Lymphadenopathy:     Cervical: No cervical adenopathy.  Skin:    General: Skin is warm and dry.  Neurological:     Mental Status: She is alert and oriented to person, place,  and time.  Psychiatric:        Attention and Perception: Attention normal.        Mood and Affect: Mood normal.        Speech: Speech normal.        Behavior: Behavior normal. Behavior is cooperative.        Thought Content: Thought content normal.   Results for orders placed or performed in visit on 08/25/20  Bayer DCA Hb A1c Waived  Result Value Ref Range   HB A1C (BAYER DCA - WAIVED) 6.8 <7.0 %  Microalbumin, Urine Waived  Result Value Ref Range   Microalb, Ur Waived 10 0 - 19 mg/L   Creatinine, Urine Waived 100  10 - 300 mg/dL   Microalb/Creat Ratio <30 <30 mg/g      Assessment & Plan:   Problem List Items Addressed This Visit       Cardiovascular and Mediastinum   Hypertension associated with diabetes ([place])    Chronic, ongoing with CKD 3.  BP at goal home and office.  At this time will continue current medication regimen and adjust as needed.  Alert provider if BP consistently >130/80 at home and then may restart Amlodipine at 5 MG.  Continue Metoprolol and Enalapril and adjust as needed.  Focus on DASH diet.  Enalapril for kidney protection.  CMP next visit.  Return in 3 months.         Endocrine   Hyperlipidemia associated with type 2 diabetes mellitus (HCC)    Chronic, ongoing.  Continue current medication regimen and adjust as needed.  Lipid panel up to date, recent LDL <70.       CKD stage 3 due to type 2 diabetes mellitus (HCC)    Chronic, ongoing.  A1C 6.8% today.  Continue Enalapril for kidney protection and continue to renal dose medications as needed.  Continue to collaborate with nephrology.       Relevant Orders   Bayer DCA Hb A1c Waived (Completed)   Microalbumin, Urine Waived (Completed)   Type 2 diabetes mellitus with stage 3b chronic kidney disease, with long-term current use of insulin (HCC) - Primary    Chronic, ongoing with A1C 6.8% today and urine ALB 10 today.  Recommend she continue Levemir and Victoza at current doses.  Will consider change to  [place] and maximizing Victoza dependent on future A1C readings.  CMP next visit.  Continue to check BS at home daily and document.  Return in 3 months.       Relevant Orders   Bayer DCA Hb A1c Waived (Completed)   Microalbumin, Urine Waived (Completed)     Other   Obesity    BMI 30.36. Recommended eating smaller high protein, low fat meals more frequently and exercising 30 mins a day 5 times a week with a goal of 10-15lb weight loss in the next 3 months. Patient voiced their understanding and motivation to adhere to these recommendations.        MGUS (monoclonal gammopathy of unknown significance)    Continue collaboration with [facility]land [facility]rmond at [facility].       Depression, major, single episode, in partial remission (HCC)    Chronic, stable.  Denies SI/HI.  Continue current medication regimen, Sertraline 100 MG daily, and adjust as needed. Recommend meditation and relaxation techniques at home.         Follow up plan: Return in about 3 months (around 11/25/2020) for T2DM, HTN/HLD, MOOD, OSTEOPENIA, B12, MGUS.

## 2020-08-27 ENCOUNTER — Other Ambulatory Visit: Payer: Self-pay | Admitting: Nurse Practitioner

## 2020-08-27 NOTE — Telephone Encounter (Signed)
last RF 02/25/20 #90 4 RF

## 2020-08-30 ENCOUNTER — Telehealth: Payer: Self-pay | Admitting: Pharmacist

## 2020-08-30 NOTE — Chronic Care Management (AMB) (Signed)
    Chronic Care Management Pharmacy Assistant   Name: TAELYN BROECKER  MRN: [id number] DOB: [birth year]   Reason for Encounter: Disease State General Adherence  Recent office visits:  08/25/20-Jolene Ned Card, NP (PCP) General follow up. Labs ordered. Follow up in 3 months. 05/25/20-Jolene Cannnday, NP (PCP) General follow up. discontinue Amlodipine. Labs ordered. Follow up in 3 months.  Recent consult visits:  05/05/20-Jeffery Byrnett (General surgery) Mammogram follow up. 05/05/20-Jeffery Byrnett (General surgery) No notes provided. 04/13/20-William Porfilio (Ophthalmology) No notes provided. 03/03/20-Munsoor Holley Raring, MD (Nephrology) Follow up in 4 months. Hospital visits:  None in previous 6 months  Medications: Outpatient Encounter Medications as of 08/30/2020  Medication Sig   acetaminophen (TYLENOL) 650 MG CR tablet Take 650 mg by mouth every 8 (eight) hours as needed for pain.   allopurinol (ZYLOPRIM) 100 MG tablet Take 1 tablet (100 mg total) by mouth daily.   aspirin EC 81 MG tablet Take 81 mg by mouth daily.   BD PEN NEEDLE NANO U/F 32G X 4 MM MISC USE EVERY MORNING   Blood Glucose Monitoring Suppl (ONETOUCH VERIO REFLECT) w/Device KIT USE TO CHECK BLOOD SUGAR DAILY   Calcium Carb-Cholecalciferol (CALCIUM 600 + D PO) Take 1 tablet by mouth 2 (two) times daily. 1000 IU VITAMIN D   enalapril (VASOTEC) 2.5 MG tablet Take 1 tablet (2.5 mg total) by mouth daily.   Famotidine (PEPCID PO) Take 10 mg by mouth 2 (two) times daily.    fluticasone (FLONASE) 50 MCG/ACT nasal spray PLACE 2 SPRAYS INTO BOTH NOSTRILS DAILY AS NEEDED FOR ALLERGIES.   insulin aspart (NOVOLOG FLEXPEN) 100 UNIT/ML FlexPen INJECT 16 UNITS UNDER THE SKIN 3 TIMES DAILY WITH MEALS   Lancets (ONETOUCH ULTRASOFT) lancets USE TO CHECK BLOOD SUGAR DAILY   LEVEMIR FLEXTOUCH 100 UNIT/ML FlexPen INJECT 50 UNITS INTO THE SKIN DAILY AT 10 PM.   liraglutide (VICTOZA) 18 MG/3ML SOPN ADMINISTER 1.2MG UNDER THE SKIN DAILY    metoprolol succinate (TOPROL-XL) 25 MG 24 hr tablet Take 1 tablet (25 mg total) by mouth daily.   ONETOUCH ULTRA test strip USE TO CHECK BLOOD SUGAR DAILY   sertraline (ZOLOFT) 100 MG tablet Take 1 tablet (100 mg total) by mouth every morning.   simvastatin (ZOCOR) 40 MG tablet TAKE 1 TABLET BY MOUTH EVERYDAY AT BEDTIME   Specialty Vitamins Products (VITA-RX DIABETIC VITAMIN) CAPS Take 1 packet by mouth daily. Diabetic Vitamins - 6 or 7 in 1 pack   No facility-administered encounter medications on file as of 08/30/2020.    Unable to reach patient for her Disease state call for General Adherence. Mailbox full could not LVM.   Star Rating Drugs: Enalapril 2.5 mg Last filled:08/06/20 90 DS Simvastatin 40 mg Last filled:02/22/20 90 DS   Myriam Elta [place], [place]

## 2020-09-23 ENCOUNTER — Other Ambulatory Visit: Payer: Self-pay | Admitting: Nurse Practitioner

## 2020-10-10 ENCOUNTER — Other Ambulatory Visit: Payer: Self-pay | Admitting: Nurse Practitioner

## 2020-11-03 ENCOUNTER — Other Ambulatory Visit: Payer: Self-pay | Admitting: Nurse Practitioner

## 2020-11-03 NOTE — Telephone Encounter (Signed)
   Notes to clinic: Patient has appt on 11/23/2020 Review for refill Should have enough until appt    Requested Prescriptions  Pending Prescriptions Disp Refills   LEVEMIR FLEXTOUCH 100 UNIT/ML FlexTouch Pen [Pharmacy Med Name: LEVEMIR FLEXTOUCH 100 UNIT/ML]  1    Sig: INJECT 50 UNITS INTO THE SKIN DAILY AT 10 PM.     Endocrinology:  Diabetes - Insulins Passed - 11/03/2020  1:30 PM      Passed - HBA1C is between 0 and 7.9 and within 180 days    Hemoglobin A1C  Date Value Ref Range Status  11/12/2013 13.4 (H) 4.2 - 6.3 % Final    Comment:    The American Diabetes Association recommends that a primary goal of therapy should be <7% and that physicians should reevaluate the treatment regimen in patients with HbA1c values consistently >8%.    HB A1C (BAYER DCA - WAIVED)  Date Value Ref Range Status  08/25/2020 6.8 <7.0 % Final    Comment:                                          Diabetic Adult            <7.0                                       Healthy Adult        4.3 - 5.7                                                           (DCCT/NGSP) American Diabetes Association's Summary of Glycemic Recommendations for Adults with Diabetes: Hemoglobin A1c <7.0%. More stringent glycemic goals (A1c <6.0%) may further reduce complications at the cost of increased risk of hypoglycemia.           Passed - Valid encounter within last 6 months    Recent Outpatient Visits           2 months ago Type 2 diabetes mellitus with stage 3b chronic kidney disease, with long-term current use of insulin ([place])   Bracey, Jolene T, NP   5 months ago Type 2 diabetes mellitus with stage 3b chronic kidney disease, with long-term current use of insulin ([place])   [place], Jolene T, NP   8 months ago Type 2 diabetes mellitus with stage 3b chronic kidney disease, with long-term current use of insulin ([place])   [place], Jolene T, NP   11  months ago Type 2 diabetes mellitus with stage 3b chronic kidney disease, with long-term current use of insulin ([place])   [place], [place] T, NP   1 year ago [place], Barbaraann Faster, NP       Future Appointments             In 2 weeks Cannady, Barbaraann Faster, NP [employer]   In 3 months  [facility]

## 2020-11-03 NOTE — Telephone Encounter (Signed)
Patient last seen in June and has appointment in September.

## 2020-11-15 DIAGNOSIS — N1831 Chronic kidney disease, stage 3a: Secondary | ICD-10-CM | POA: Diagnosis not present

## 2020-11-15 DIAGNOSIS — I10 Essential (primary) hypertension: Secondary | ICD-10-CM | POA: Diagnosis not present

## 2020-11-15 DIAGNOSIS — E1122 Type 2 diabetes mellitus with diabetic chronic kidney disease: Secondary | ICD-10-CM | POA: Diagnosis not present

## 2020-11-23 ENCOUNTER — Encounter: Payer: Self-pay | Admitting: Nurse Practitioner

## 2020-11-23 ENCOUNTER — Other Ambulatory Visit: Payer: Self-pay

## 2020-11-23 ENCOUNTER — Ambulatory Visit (INDEPENDENT_AMBULATORY_CARE_PROVIDER_SITE_OTHER): Payer: Medicare HMO | Admitting: Nurse Practitioner

## 2020-11-23 VITALS — BP 104/69 | HR 91 | Temp 98.9°F | Wt 152.0 lb

## 2020-11-23 DIAGNOSIS — D472 Monoclonal gammopathy: Secondary | ICD-10-CM

## 2020-11-23 DIAGNOSIS — Z794 Long term (current) use of insulin: Secondary | ICD-10-CM | POA: Diagnosis not present

## 2020-11-23 DIAGNOSIS — E6609 Other obesity due to excess calories: Secondary | ICD-10-CM | POA: Diagnosis not present

## 2020-11-23 DIAGNOSIS — Z6830 Body mass index (BMI) 30.0-30.9, adult: Secondary | ICD-10-CM

## 2020-11-23 DIAGNOSIS — N183 Chronic kidney disease, stage 3 unspecified: Secondary | ICD-10-CM

## 2020-11-23 DIAGNOSIS — E1122 Type 2 diabetes mellitus with diabetic chronic kidney disease: Secondary | ICD-10-CM

## 2020-11-23 DIAGNOSIS — E785 Hyperlipidemia, unspecified: Secondary | ICD-10-CM | POA: Diagnosis not present

## 2020-11-23 DIAGNOSIS — M8588 Other specified disorders of bone density and structure, other site: Secondary | ICD-10-CM | POA: Diagnosis not present

## 2020-11-23 DIAGNOSIS — F324 Major depressive disorder, single episode, in partial remission: Secondary | ICD-10-CM | POA: Diagnosis not present

## 2020-11-23 DIAGNOSIS — E1159 Type 2 diabetes mellitus with other circulatory complications: Secondary | ICD-10-CM | POA: Diagnosis not present

## 2020-11-23 DIAGNOSIS — F5101 Primary insomnia: Secondary | ICD-10-CM

## 2020-11-23 DIAGNOSIS — I152 Hypertension secondary to endocrine disorders: Secondary | ICD-10-CM | POA: Diagnosis not present

## 2020-11-23 DIAGNOSIS — R69 Illness, unspecified: Secondary | ICD-10-CM | POA: Diagnosis not present

## 2020-11-23 DIAGNOSIS — E1169 Type 2 diabetes mellitus with other specified complication: Secondary | ICD-10-CM | POA: Diagnosis not present

## 2020-11-23 DIAGNOSIS — N1832 Chronic kidney disease, stage 3b: Secondary | ICD-10-CM

## 2020-11-23 DIAGNOSIS — E66811 Obesity, class 1: Secondary | ICD-10-CM

## 2020-11-23 LAB — BAYER DCA HB A1C WAIVED: HB A1C (BAYER DCA - WAIVED): 6.8 % — ABNORMAL HIGH (ref 4.8–5.6)

## 2020-11-23 MED ORDER — ONETOUCH DELICA PLUS LANCET30G MISC: 4 refills | Status: AC

## 2020-11-23 NOTE — Assessment & Plan Note (Signed)
Ongoing, stable.  Continue Vitamin D and calcium daily.  Check Vit D level next visit.  Repeat DEXA in July 2025.

## 2020-11-23 NOTE — Assessment & Plan Note (Signed)
Chronic, stable.  Denies SI/HI.  Continue current medication regimen, Sertraline 100 MG daily, and adjust as needed. Recommend meditation and relaxation techniques at home. 

## 2020-11-23 NOTE — Assessment & Plan Note (Signed)
Chronic, ongoing with CKD 3.  BP at goal home and office.  At this time will continue current medication regimen and adjust as needed.  Alert provider if BP consistently >130/90 at home and then may restart Amlodipine at 2.5 MG.  Continue Metoprolol and Enalapril and adjust as needed.  Focus on DASH diet.  Enalapril for kidney protection.  CMP today.  Return in 3 months.

## 2020-11-23 NOTE — Assessment & Plan Note (Signed)
Ongoing with depression.  Recommend trial of Melatonin at home to help with maintaining good sleep pattern and less frequent waking.  Start low dose and may increase to 10 MG nightly if needed.  Educated her on this.  Focus on good sleep hygiene techniques.

## 2020-11-23 NOTE — Assessment & Plan Note (Signed)
Chronic, ongoing with A1C 6.8% today and urine ALB 10 on check this year.  Recommend she continue Levemir and Victoza at current doses.  Will consider change to [place] and maximizing Victoza dependent on future A1C readings.  CMP today.  Continue to check BS at home daily and document + continue collaboration with nephrology.  Return in 3 months.

## 2020-11-23 NOTE — Patient Instructions (Signed)
Diabetes Mellitus and Nutrition, Adult When you have diabetes, or diabetes mellitus, it is very important to have healthy eating habits because your blood sugar (glucose) levels are greatly affected by what you eat and drink. Eating healthy foods in the right amounts, at about the same times every day, can help you:  Control your blood glucose.  Lower your risk of heart disease.  Improve your blood pressure.  Reach or maintain a healthy weight. What can affect my meal plan? Every person with diabetes is different, and each person has different needs for a meal plan. Your health care provider may recommend that you work with a dietitian to make a meal plan that is best for you. Your meal plan may vary depending on factors such as:  The calories you need.  The medicines you take.  Your weight.  Your blood glucose, blood pressure, and cholesterol levels.  Your activity level.  Other health conditions you have, such as heart or kidney disease. How do carbohydrates affect me? Carbohydrates, also called carbs, affect your blood glucose level more than any other type of food. Eating carbs naturally raises the amount of glucose in your blood. Carb counting is a method for keeping track of how many carbs you eat. Counting carbs is important to keep your blood glucose at a healthy level, especially if you use insulin or take certain oral diabetes medicines. It is important to know how many carbs you can safely have in each meal. This is different for every person. Your dietitian can help you calculate how many carbs you should have at each meal and for each snack. How does alcohol affect me? Alcohol can cause a sudden decrease in blood glucose (hypoglycemia), especially if you use insulin or take certain oral diabetes medicines. Hypoglycemia can be a life-threatening condition. Symptoms of hypoglycemia, such as sleepiness, dizziness, and confusion, are similar to symptoms of having too much  alcohol.  Do not drink alcohol if: ? Your health care provider tells you not to drink. ? You are pregnant, may be pregnant, or are planning to become pregnant.  If you drink alcohol: ? Do not drink on an empty stomach. ? Limit how much you use to:  0-1 drink a day for women.  0-2 drinks a day for men. ? Be aware of how much alcohol is in your drink. In the [place] one drink equals one 12 oz bottle of beer (355 mL), one 5 oz glass of wine (148 mL), or one 1 oz glass of hard liquor (44 mL). ? Keep yourself hydrated with water, diet soda, or unsweetened iced tea.  Keep in mind that regular soda, juice, and other mixers may contain a lot of sugar and must be counted as carbs. What are tips for following this plan? Reading food labels  Start by checking the serving size on the "Nutrition Facts" label of packaged foods and drinks. The amount of calories, carbs, fats, and other nutrients listed on the label is based on one serving of the item. Many items contain more than one serving per package.  Check the total grams (g) of carbs in one serving. You can calculate the number of servings of carbs in one serving by dividing the total carbs by 15. For example, if a food has 30 g of total carbs per serving, it would be equal to 2 servings of carbs.  Check the number of grams (g) of saturated fats and trans fats in one serving. Choose foods that have   a low amount or none of these fats.  Check the number of milligrams (mg) of salt (sodium) in one serving. Most people should limit total sodium intake to less than 2,300 mg per day.  Always check the nutrition information of foods labeled as "low-fat" or "nonfat." These foods may be higher in added sugar or refined carbs and should be avoided.  Talk to your dietitian to identify your daily goals for nutrients listed on the label. Shopping  Avoid buying canned, pre-made, or processed foods. These foods tend to be high in fat, sodium, and added  sugar.  Shop around the outside edge of the grocery store. This is where you will most often find fresh fruits and vegetables, bulk grains, fresh meats, and fresh dairy. Cooking  Use low-heat cooking methods, such as baking, instead of high-heat cooking methods like deep frying.  Cook using healthy oils, such as olive, canola, or sunflower oil.  Avoid cooking with butter, cream, or high-fat meats. Meal planning  Eat meals and snacks regularly, preferably at the same times every day. Avoid going long periods of time without eating.  Eat foods that are high in fiber, such as fresh fruits, vegetables, beans, and whole grains. Talk with your dietitian about how many servings of carbs you can eat at each meal.  Eat 4-6 oz (112-168 g) of lean protein each day, such as lean meat, chicken, fish, eggs, or tofu. One ounce (oz) of lean protein is equal to: ? 1 oz (28 g) of meat, chicken, or fish. ? 1 egg. ?  cup (62 g) of tofu.  Eat some foods each day that contain healthy fats, such as avocado, nuts, seeds, and fish.   What foods should I eat? Fruits Berries. Apples. Oranges. Peaches. Apricots. Plums. Grapes. Mango. Papaya. Pomegranate. Kiwi. Cherries. Vegetables Lettuce. Spinach. Leafy greens, including kale, chard, collard greens, and mustard greens. Beets. Cauliflower. Cabbage. Broccoli. Carrots. Green beans. Tomatoes. Peppers. Onions. Cucumbers. Brussels sprouts. Grains Whole grains, such as whole-wheat or whole-grain bread, crackers, tortillas, cereal, and pasta. Unsweetened oatmeal. Quinoa. Brown or wild rice. Meats and other proteins Seafood. Poultry without skin. Lean cuts of poultry and beef. Tofu. Nuts. Seeds. Dairy Low-fat or fat-free dairy products such as milk, yogurt, and cheese. The items listed above may not be a complete list of foods and beverages you can eat. Contact a dietitian for more information. What foods should I avoid? Fruits Fruits canned with  syrup. Vegetables Canned vegetables. Frozen vegetables with butter or cream sauce. Grains Refined white flour and flour products such as bread, pasta, snack foods, and cereals. Avoid all processed foods. Meats and other proteins Fatty cuts of meat. Poultry with skin. Breaded or fried meats. Processed meat. Avoid saturated fats. Dairy Full-fat yogurt, cheese, or milk. Beverages Sweetened drinks, such as soda or iced tea. The items listed above may not be a complete list of foods and beverages you should avoid. Contact a dietitian for more information. Questions to ask a health care provider  Do I need to meet with a diabetes educator?  Do I need to meet with a dietitian?  What number can I call if I have questions?  When are the best times to check my blood glucose? Where to find more information:  American Diabetes Association: diabetes.org  Academy of Nutrition and Dietetics: www.eatright.org  National Institute of Diabetes and Digestive and Kidney Diseases: www.niddk.nih.gov  Association of Diabetes Care and Education Specialists: www.diabeteseducator.org Summary  It is important to have healthy eating   habits because your blood sugar (glucose) levels are greatly affected by what you eat and drink.  A healthy meal plan will help you control your blood glucose and maintain a healthy lifestyle.  Your health care provider may recommend that you work with a dietitian to make a meal plan that is best for you.  Keep in mind that carbohydrates (carbs) and alcohol have immediate effects on your blood glucose levels. It is important to count carbs and to use alcohol carefully. This information is not intended to replace advice given to you by your health care provider. Make sure you discuss any questions you have with your health care provider. Document Revised: 02/10/2019 Document Reviewed: 02/10/2019 Elsevier Patient Education  2021 Elsevier Inc.  

## 2020-11-23 NOTE — Assessment & Plan Note (Signed)
Chronic, ongoing.  Continue current medication regimen and adjust as needed.  Lipid panel today, recent LDL < 70. 

## 2020-11-23 NOTE — Assessment & Plan Note (Signed)
BMI 30.64.  Recommended eating smaller high protein, low fat meals more frequently and exercising 30 mins a day 5 times a week with a goal of 10-15lb weight loss in the next 3 months. Patient voiced their understanding and motivation to adhere to these recommendations.

## 2020-11-23 NOTE — Progress Notes (Signed)
BP 104/69   Pulse 91   Temp 98.9 F (37.2 C) (Oral)   Wt 152 lb (68.9 kg)   LMP  (LMP Unknown)   SpO2 96%   BMI 30.64 kg/m    Subjective:    Patient ID: Kara Leach, female    DOB: May 16, 1950, 70 y.o.   MRN: [id number]  HPI: Kara Leach is a 70 y.o. female  Chief Complaint  Patient presents with   Diabetes    Patient states she has been doing well with her sugar readings.    Hyperlipidemia   Hypertension   Osteopenia   Mood   DIABETES Last A1C 6.8% in June 2022.  Continues on Novolog pre meal and Levemir + Victoza 1.2 MG daily.   Hypoglycemic episodes:no Polydipsia/polyuria: no Visual disturbance: no Chest pain: no Paresthesias: no Glucose Monitoring: yes  Accucheck frequency: BID  Fasting glucose: 119 this morning -- averages between 120  Post prandial:  Evening: average 140  Before meals: Taking Insulin?: yes  Long acting insulin: Levemir 50 units  Short acting insulin: Novolog 14 units in evening and 12 in morning and lunch Blood Pressure Monitoring: daily Retinal Examination: Up To Date Foot Exam: Up to Date Pneumovax: Up to Date Influenza: Up to Date Aspirin: yes   HYPERTENSION / HYPERLIPIDEMIA Continues on Enalapril, Metoprolol, and ASA + Simvastatin.  Discontinued Amlodipine in March 2022 due to lower BP levels and continues to tolerate this discontinuation. Satisfied with current treatment? yes Duration of hypertension: chronic BP monitoring frequency: daily BP range:  100-110/70's -- one 143/90 recently when rushing BP medication side effects: no Duration of hyperlipidemia: chronic Cholesterol medication side effects: no Cholesterol supplements: none Medication compliance: good compliance Aspirin: yes Recent stressors: no Recurrent headaches: no Visual changes: no Palpitations: no Dyspnea: no Chest pain: no Lower extremity edema: no Dizzy/lightheaded: no   OSTEOPENIA Continues on supplement daily.  Last DEXA 09/19/2018 with  T-score -2.1. Satisfied with current treatment?: yes Adequate calcium & vitamin D: yes Weight bearing exercises: yes   CHRONIC KIDNEY DISEASE With GFR ranging from 44-56 over past three years.  Last saw nephrology on 11/15/20 and labs with CRT 1.40 and GFR 41, urine ALB recent visit 10. CKD status: stable Medications renally dose: yes Previous renal evaluation: yes Pneumovax:  Up to Date Influenza Vaccine:  Up to Date   MGUS: Last saw oncology on 01/26/2020 with numbers remaining stable. Sees Dr. Grayland Ormond every 6 months, returns in October.  Saw Dr. Bary Castilla on 05/05/20 for follow-up on breasts and good report obtained, is to continue yearly mammograms -- to return to him as needed only.  DEPRESSION Continues on Sertraline 100 MG daily.  Mood status: stable Satisfied with current treatment?: yes Symptom severity: mild  Duration of current treatment : chronic Side effects: no Medication compliance: good compliance Psychotherapy/counseling: none Depressed mood: occasional Anxious mood: occasional Anhedonia: no Significant weight loss or gain: no Insomnia: occasional, hard to stay asleep Fatigue: no Feelings of worthlessness or guilt: no Impaired concentration/indecisiveness: no Suicidal ideations: no Hopelessness: no Crying spells: no Depression screen [facility] 2/9 11/23/2020 08/25/2020 05/25/2020 02/25/2020 02/15/2020  Decreased Interest 0 0 0 1 0  Down, Depressed, Hopeless '1 1 3 1 3  '$ PHQ - 2 Score '1 1 3 2 3  '$ Altered sleeping '1 3 3 3 '$ 0  Tired, decreased energy '1 3 3 1 1  '$ Change in appetite 0 0 0 0 0  Feeling bad or failure about yourself  0 0 1 1  1  Trouble concentrating 0 0 0 0 0  Moving slowly or fidgety/restless 0 0 0 0 0  Suicidal thoughts 0 0 0 0 0  PHQ-9 Score '3 7 10 7 5  '$ Difficult doing work/chores Not difficult at all Somewhat difficult - - Somewhat difficult  Some recent data might be hidden    Relevant past medical, surgical, family and social history reviewed and updated  as indicated. Interim medical history since our last visit reviewed. Allergies and medications reviewed and updated.  Review of Systems  Constitutional:  Negative for activity change, appetite change, diaphoresis, fatigue and fever.  Respiratory:  Negative for cough, chest tightness and shortness of breath.   Cardiovascular:  Negative for chest pain, palpitations and leg swelling.  Gastrointestinal: Negative.   Endocrine: Negative for cold intolerance, heat intolerance, polydipsia, polyphagia and polyuria.  Neurological: Negative.   Psychiatric/Behavioral: Negative.     Per HPI unless specifically indicated above     Objective:    BP 104/69   Pulse 91   Temp 98.9 F (37.2 C) (Oral)   Wt 152 lb (68.9 kg)   LMP  (LMP Unknown)   SpO2 96%   BMI 30.64 kg/m   Wt Readings from Last 3 Encounters:  11/23/20 152 lb (68.9 kg)  08/25/20 150 lb 9.6 oz (68.3 kg)  05/25/20 149 lb 12.8 oz (67.9 kg)    Physical Exam Vitals and nursing note reviewed.  Constitutional:      General: She is awake. She is not in acute distress.    Appearance: She is well-developed and well-groomed. She is obese. She is not ill-appearing.  HENT:     Head: Normocephalic.     Right Ear: Hearing normal.     Left Ear: Hearing normal.  Eyes:     General: Lids are normal.        Right eye: No discharge.        Left eye: No discharge.     Conjunctiva/sclera: Conjunctivae normal.     Pupils: Pupils are equal, round, and reactive to light.  Neck:     Thyroid: No thyromegaly.     Vascular: No carotid bruit.  Cardiovascular:     Rate and Rhythm: Normal rate and regular rhythm.     Heart sounds: Normal heart sounds. No murmur heard.   No gallop.     Comments: Varicose veins to bilateral lower legs.   Pulmonary:     Effort: Pulmonary effort is normal. No accessory muscle usage or respiratory distress.     Breath sounds: Normal breath sounds.  Abdominal:     General: Bowel sounds are normal.     Palpations:  Abdomen is soft.     Tenderness: There is no abdominal tenderness.  Musculoskeletal:     Cervical back: Normal range of motion and neck supple.     Right lower leg: No edema.     Left lower leg: No edema.  Lymphadenopathy:     Cervical: No cervical adenopathy.  Skin:    General: Skin is warm and dry.  Neurological:     Mental Status: She is alert and oriented to person, place, and time.  Psychiatric:        Attention and Perception: Attention normal.        Mood and Affect: Mood normal.        Speech: Speech normal.        Behavior: Behavior normal. Behavior is cooperative.        Thought Content:  Thought content normal.   Results for orders placed or performed in visit on 08/25/20  Bayer DCA Hb A1c Waived  Result Value Ref Range   HB A1C (BAYER DCA - WAIVED) 6.8 <7.0 %  Microalbumin, Urine Waived  Result Value Ref Range   Microalb, Ur Waived 10 0 - 19 mg/L   Creatinine, Urine Waived 100 10 - 300 mg/dL   Microalb/Creat Ratio <30 <30 mg/g      Assessment & Plan:   Problem List Items Addressed This Visit       Cardiovascular and Mediastinum   Hypertension associated with diabetes ([place]) (Chronic)    Chronic, ongoing with CKD 3.  BP at goal home and office.  At this time will continue current medication regimen and adjust as needed.  Alert provider if BP consistently >130/90 at home and then may restart Amlodipine at 2.5 MG.  Continue Metoprolol and Enalapril and adjust as needed.  Focus on DASH diet.  Enalapril for kidney protection.  CMP today.  Return in 3 months.      Relevant Orders   Bayer DCA Hb A1c Waived   Comprehensive metabolic panel     Endocrine   Hyperlipidemia associated with type 2 diabetes mellitus (HCC) (Chronic)    Chronic, ongoing.  Continue current medication regimen and adjust as needed.  Lipid panel today, recent LDL <70.      Relevant Orders   Bayer DCA Hb A1c Waived   Comprehensive metabolic panel   Lipid Panel w/o Chol/HDL Ratio   CKD stage 3  due to type 2 diabetes mellitus (HCC) (Chronic)    Chronic, ongoing.  A1C 6.8% today.  Continue Enalapril for kidney protection and continue to renal dose medications as needed.  Continue to collaborate with nephrology.      Relevant Orders   Bayer DCA Hb A1c Waived   Comprehensive metabolic panel   Type 2 diabetes mellitus with stage 3b chronic kidney disease, with long-term current use of insulin (HCC) - Primary (Chronic)    Chronic, ongoing with A1C 6.8% today and urine ALB 10 on check this year.  Recommend she continue Levemir and Victoza at current doses.  Will consider change to [place] and maximizing Victoza dependent on future A1C readings.  CMP today.  Continue to check BS at home daily and document + continue collaboration with nephrology.  Return in 3 months.      Relevant Orders   Bayer DCA Hb A1c Waived     Musculoskeletal and Integument   Osteopenia of spine (Chronic)    Ongoing, stable.  Continue Vitamin D and calcium daily.  Check Vit D level next visit.  Repeat DEXA in July 2025.          Other   Obesity (Chronic)    BMI 30.64.  Recommended eating smaller high protein, low fat meals more frequently and exercising 30 mins a day 5 times a week with a goal of 10-15lb weight loss in the next 3 months. Patient voiced their understanding and motivation to adhere to these recommendations.       MGUS (monoclonal gammopathy of unknown significance) (Chronic)    Continue collaboration with [facility]land [facility]rm[facility] at [facility].      Insomnia    Ongoing with depression.  Recommend trial of Melatonin at home to help with maintaining good sleep pattern and less frequent waking.  Start low dose and may increase to 10 MG nightly if needed.  Educated her on this.  Focus on good sleep  hygiene techniques.        Depression, major, single episode, in partial remission (HCC)    Chronic, stable.  Denies SI/HI.  Continue current medication regimen, Sertraline 100 MG daily, and adjust as needed.  Recommend meditation and relaxation techniques at home.        Follow up plan: Return in about 3 months (around 02/27/2021) for Annual physical with diabetes check.

## 2020-11-23 NOTE — Assessment & Plan Note (Signed)
Chronic, ongoing.  A1C 6.8% today.  Continue Enalapril for kidney protection and continue to renal dose medications as needed.  Continue to collaborate with nephrology.

## 2020-11-23 NOTE — Assessment & Plan Note (Signed)
Continue collaboration with Dr. Grayland Ormond at [facility].

## 2020-11-24 LAB — COMPREHENSIVE METABOLIC PANEL
ALT: 27 IU/L (ref 0–32)
AST: 45 IU/L — ABNORMAL HIGH (ref 0–40)
Albumin/Globulin Ratio: 1.7 (ref 1.2–2.2)
Albumin: 4.6 g/dL (ref 3.8–4.8)
Alkaline Phosphatase: 106 IU/L (ref 44–121)
BUN/Creatinine Ratio: 15 (ref 12–28)
BUN: 19 mg/dL (ref 8–27)
Bilirubin Total: 0.3 mg/dL (ref 0.0–1.2)
CO2: 23 mmol/L (ref 20–29)
Calcium: 9.9 mg/dL (ref 8.7–10.3)
Chloride: 102 mmol/L (ref 96–106)
Creatinine, Ser: 1.24 mg/dL — ABNORMAL HIGH (ref 0.57–1.00)
Globulin, Total: 2.7 g/dL (ref 1.5–4.5)
Glucose: 152 mg/dL — ABNORMAL HIGH (ref 65–99)
Potassium: 4.7 mmol/L (ref 3.5–5.2)
Sodium: 140 mmol/L (ref 134–144)
Total Protein: 7.3 g/dL (ref 6.0–8.5)
eGFR: 48 mL/min/{1.73_m2} — ABNORMAL LOW (ref >59)

## 2020-11-24 LAB — LIPID PANEL W/O CHOL/HDL RATIO
Cholesterol, Total: 131 mg/dL (ref 100–199)
HDL: 42 mg/dL (ref >39)
LDL Chol Calc (NIH): 58 mg/dL (ref 0–99)
Triglycerides: 190 mg/dL — ABNORMAL HIGH (ref 0–149)
VLDL Cholesterol Cal: 31 mg/dL (ref 5–40)

## 2020-11-24 NOTE — Progress Notes (Signed)
Good morning, please let Kara Leach know her labs have returned.  Kidney function remains stable at baseline, no worsening kidney disease.  Liver function testing improved from last check and coming down to almost normal. Cholesterol levels at goal.  Continue all current medications.  Any questions? Keep being amazing!!  Thank you for allowing me to participate in your care.  I appreciate you. Kindest regards, Arnika Larzelere

## 2020-12-08 ENCOUNTER — Telehealth: Payer: Self-pay

## 2020-12-08 NOTE — Progress Notes (Signed)
    Chronic Care Management Pharmacy Assistant   Name: Kara Leach  MRN: [id number] DOB: 19-Apr-1950  Opened in error

## 2020-12-14 ENCOUNTER — Other Ambulatory Visit: Payer: Self-pay | Admitting: Nurse Practitioner

## 2020-12-14 MED ORDER — METOPROLOL SUCCINATE ER 25 MG PO TB24: 25.0000 mg | ORAL_TABLET | Freq: Every day | ORAL | 4 refills | Status: DC

## 2021-01-19 ENCOUNTER — Inpatient Hospital Stay: Payer: Medicare HMO | Attending: Oncology

## 2021-01-19 ENCOUNTER — Other Ambulatory Visit: Payer: Self-pay

## 2021-01-19 DIAGNOSIS — D472 Monoclonal gammopathy: Secondary | ICD-10-CM | POA: Diagnosis not present

## 2021-01-19 DIAGNOSIS — Z7982 Long term (current) use of aspirin: Secondary | ICD-10-CM | POA: Insufficient documentation

## 2021-01-19 DIAGNOSIS — Z79899 Other long term (current) drug therapy: Secondary | ICD-10-CM | POA: Diagnosis not present

## 2021-01-19 DIAGNOSIS — E1165 Type 2 diabetes mellitus with hyperglycemia: Secondary | ICD-10-CM | POA: Insufficient documentation

## 2021-01-19 DIAGNOSIS — Z794 Long term (current) use of insulin: Secondary | ICD-10-CM | POA: Diagnosis not present

## 2021-01-19 DIAGNOSIS — E1142 Type 2 diabetes mellitus with diabetic polyneuropathy: Secondary | ICD-10-CM | POA: Insufficient documentation

## 2021-01-19 DIAGNOSIS — N189 Chronic kidney disease, unspecified: Secondary | ICD-10-CM | POA: Diagnosis not present

## 2021-01-19 LAB — BASIC METABOLIC PANEL
Anion gap: 7 (ref 5–15)
BUN: 22 mg/dL (ref 8–23)
CO2: 28 mmol/L (ref 22–32)
Calcium: 9.7 mg/dL (ref 8.9–10.3)
Chloride: 102 mmol/L (ref 98–111)
Creatinine, Ser: 1.08 mg/dL — ABNORMAL HIGH (ref 0.44–1.00)
GFR, Estimated: 56 mL/min — ABNORMAL LOW (ref >60)
Glucose, Bld: 68 mg/dL — ABNORMAL LOW (ref 70–99)
Potassium: 4.2 mmol/L (ref 3.5–5.1)
Sodium: 137 mmol/L (ref 135–145)

## 2021-01-19 LAB — CBC WITH DIFFERENTIAL/PLATELET
Abs Immature Granulocytes: 0.02 10*3/uL (ref 0.00–0.07)
Basophils Absolute: 0.1 10*3/uL (ref 0.0–0.1)
Basophils Relative: 1 %
Eosinophils Absolute: 0.2 10*3/uL (ref 0.0–0.5)
Eosinophils Relative: 2 %
HCT: 38.7 % (ref 36.0–46.0)
Hemoglobin: 12.8 g/dL (ref 12.0–15.0)
Immature Granulocytes: 0 %
Lymphocytes Relative: 49 %
Lymphs Abs: 4.6 10*3/uL — ABNORMAL HIGH (ref 0.7–4.0)
MCH: 31.1 pg (ref 26.0–34.0)
MCHC: 33.1 g/dL (ref 30.0–36.0)
MCV: 94.2 fL (ref 80.0–100.0)
Monocytes Absolute: 0.7 10*3/uL (ref 0.1–1.0)
Monocytes Relative: 7 %
Neutro Abs: 3.9 10*3/uL (ref 1.7–7.7)
Neutrophils Relative %: 41 %
Platelets: 241 10*3/uL (ref 150–400)
RBC: 4.11 MIL/uL (ref 3.87–5.11)
RDW: 13 % (ref 11.5–15.5)
WBC: 9.5 10*3/uL (ref 4.0–10.5)
nRBC: 0 % (ref 0.0–0.2)

## 2021-01-19 NOTE — Progress Notes (Signed)
[place]  [phone] [phone]  ID: JAILYNN LAVALAIS OB: [birth year]  MR#: [id number]  KDT#:[id number]  Patient Care Team: Venita Lick, NP as PCP - General (Nurse Practitioner) Bary Castilla Forest Gleason, MD (General Surgery) Anthonette Legato, MD (Internal Medicine) Leanor Kail, MD (Inactive) (Orthopedic Surgery) Lloyd Huger, MD as Consulting Physician (Oncology) Vladimir Faster, [facility] (Pharmacist)  CHIEF COMPLAINT: MGUS  INTERVAL HISTORY: Patient returns to clinic today for routine yearly evaluation and discussion of her laboratory results.  She continues to feel well and remains asymptomatic.  She has no neurologic complaints.  She denies any recent fevers or illnesses.  She has a good appetite and denies weight loss.  She denies any pain.  She has no chest pain, shortness of breath, cough, or hemoptysis.  She denies any nausea, vomiting, constipation, or diarrhea.  She has no urinary complaints.  Patient offers no specific complaints today.  REVIEW OF SYSTEMS:   Review of Systems  Constitutional: Negative.  Negative for fever, malaise/fatigue and weight loss.  Respiratory: Negative.  Negative for shortness of breath.   Cardiovascular: Negative.  Negative for chest pain and leg swelling.  Gastrointestinal: Negative.  Negative for abdominal pain.  Genitourinary: Negative.  Negative for dysuria.  Musculoskeletal: Negative.  Negative for back pain.  Skin: Negative.  Negative for rash.  Neurological: Negative.  Negative for sensory change, focal weakness, weakness and headaches.  Psychiatric/Behavioral: Negative.  The patient is not nervous/anxious.    As per HPI. Otherwise, a complete review of systems is negative.  PAST MEDICAL HISTORY: Past Medical History:  Diagnosis Date   Allergy    Anxiety    Arthritis    Osteoarthritis   Cancer ([place]) 05/2018   possibly on left breast but not sure   Chronic kidney disease    Depression     Diabetes mellitus without complication ([place]) 9983   Diabetic neuropathy ([place])    feet   Diverticulitis 2017   GERD (gastroesophageal reflux disease)    Headache    sinus and migraines on occasion   Hyperlipidemia    Hypertension    Last menstrual period (LMP) > 10 days ago 1986   Obesity    Peripheral vascular disease (HCC)    neuropathy in hands and feet d/t diabetes   Renal insufficiency    Stage 2 kidney disease   Seizures ([place])    due to high blood sugars. last time = 09/2014   Sleep apnea    has CPAP, can't tolerate   Stroke ([place]) 1980   minor. left side remains weaker than right   Wears hearing aid    right side   Wrist weakness    left - after fracture and repair    PAST SURGICAL HISTORY: Past Surgical History:  Procedure Laterality Date   ABDOMINAL HYSTERECTOMY  1986   APPENDECTOMY  1964   BREAST BIOPSY Left 07/03/2016   benign, fibrocystic changes with calcifications, usual ductal hyperplasia.    BREAST BIOPSY Left 04/23/2018   radial scar   BREAST BIOPSY Left 08/13/2018   Procedure: WIDE LOCALIZATION AND OPEN BIOPSY OF LEFT BREAST, DIABETIC;  Surgeon: Robert Bellow, MD;  Location: [facility] ORS;  Service: General;  Laterality: Left;   BREAST EXCISIONAL BIOPSY Left 08/13/2018   CHOLECYSTECTOMY  2012   COLONOSCOPY WITH PROPOFOL N/A 12/01/2015   Procedure: COLONOSCOPY WITH PROPOFOL;  Surgeon: [name], MD;  Location: [place];  Service: Endoscopy;  Laterality: N/A;  Diabetic - insulin Sleep  apnea   PATELLA FRACTURE SURGERY Left    1. had to recenter kneecap, 2 and 3 surgeries were arthroscopies   WRIST FRACTURE SURGERY Left 2005   Steel plate    FAMILY HISTORY Family History  Problem Relation Age of Onset   Diabetes Mother    Heart disease Mother    Hypertension Mother    Diabetes Sister    Cancer Father        stomach   Arthritis Sister    Stroke Sister    Hypertension Sister    Breast cancer Maternal Aunt 62       great aunt        ADVANCED DIRECTIVES:    HEALTH MAINTENANCE: Social History   Tobacco Use   Smoking status: Never   Smokeless tobacco: Never  Vaping Use   Vaping Use: Never used  Substance Use Topics   Alcohol use: Not Currently    Alcohol/week: 0.0 standard drinks    Comment: occasionally holiday, weddings   Drug use: No     Colonoscopy:  PAP:  Bone density:  Lipid panel:  Allergies  Allergen Reactions   Strawberry Extract Swelling    Lips swell and rash/whelps over body Difficulty breathing   Ibuprofen Other (See Comments)    "ringing in ears", dizziness Also, Advil / Aleve / Motrin    Current Outpatient Medications  Medication Sig Dispense Refill   acetaminophen (TYLENOL) 650 MG CR tablet Take 650 mg by mouth every 8 (eight) hours as needed for pain.     allopurinol (ZYLOPRIM) 100 MG tablet TAKE 1 TABLET BY MOUTH EVERY DAY 90 tablet 2   aspirin EC 81 MG tablet Take 81 mg by mouth daily.     Blood Glucose Monitoring Suppl (ONETOUCH VERIO REFLECT) w/Device KIT USE TO CHECK BLOOD SUGAR DAILY     Calcium Carb-Cholecalciferol (CALCIUM 600 + D PO) Take 1 tablet by mouth 2 (two) times daily. 1000 IU VITAMIN D     enalapril (VASOTEC) 2.5 MG tablet Take 1 tablet (2.5 mg total) by mouth daily. 90 tablet 4   Famotidine (PEPCID PO) Take 10 mg by mouth 2 (two) times daily.      fluticasone (FLONASE) 50 MCG/ACT nasal spray PLACE 2 SPRAYS INTO BOTH NOSTRILS DAILY AS NEEDED FOR ALLERGIES. 48 mL 4   insulin aspart (NOVOLOG FLEXPEN) 100 UNIT/ML FlexPen INJECT 16 UNITS UNDER THE SKIN 3 TIMES DAILY WITH MEALS 15 mL 4   Lancets (ONETOUCH DELICA PLUS [id number]) MISC Use to check blood sugars 3 to 4 times a day. 200 each 4   LEVEMIR FLEXTOUCH 100 UNIT/ML FlexTouch Pen INJECT 50 UNITS INTO THE SKIN DAILY AT 10 PM. 15 mL 4   liraglutide (VICTOZA) 18 MG/3ML SOPN ADMINISTER 1.2MG UNDER THE SKIN DAILY 18 mL 4   metoprolol succinate (TOPROL-XL) 25 MG 24 hr tablet Take 1 tablet (25 mg total) by mouth  daily. 90 tablet 4   ONETOUCH ULTRA test strip USE TO CHECK BLOOD SUGAR DAILY 100 strip 12   sertraline (ZOLOFT) 100 MG tablet Take 1 tablet (100 mg total) by mouth every morning. 90 tablet 4   simvastatin (ZOCOR) 40 MG tablet TAKE 1 TABLET BY MOUTH EVERYDAY AT BEDTIME 90 tablet 4   Specialty Vitamins Products (VITA-RX DIABETIC VITAMIN) CAPS Take 1 packet by mouth daily. Diabetic Vitamins - 6 or 7 in 1 pack     BD PEN NEEDLE NANO U/F 32G X 4 MM MISC USE EVERY MORNING 100 each 12  No current facility-administered medications for this visit.    OBJECTIVE: Vitals:   01/26/21 1050  BP: 136/80  Pulse: 87  Resp: 16  Temp: (!) 97.1 F (36.2 C)  SpO2: 98%     Body mass index is 31.37 kg/m.    ECOG FS:0 - Asymptomatic  General: Well-developed, well-nourished, no acute distress. Eyes: Pink conjunctiva, anicteric sclera. HEENT: Normocephalic, moist mucous membranes. Lungs: No audible wheezing or coughing. Heart: Regular rate and rhythm. Abdomen: Soft, nontender, no obvious distention. Musculoskeletal: No edema, cyanosis, or clubbing. Neuro: Alert, answering all questions appropriately. Cranial nerves grossly intact. Skin: No rashes or petechiae noted. Psych: Normal affect.   LAB RESULTS:  Lab Results  Component Value Date   NA 137 01/19/2021   K 4.2 01/19/2021   CL 102 01/19/2021   CO2 28 01/19/2021   GLUCOSE 68 (L) 01/19/2021   BUN 22 01/19/2021   CREATININE 1.08 (H) 01/19/2021   CALCIUM 9.7 01/19/2021   PROT 7.3 11/23/2020   ALBUMIN 4.6 11/23/2020   AST 45 (H) 11/23/2020   ALT 27 11/23/2020   ALKPHOS 106 11/23/2020   BILITOT 0.3 11/23/2020   GFRNONAA 56 (L) 01/19/2021   GFRAA 49 (L) 02/25/2020    Lab Results  Component Value Date   WBC 9.5 01/19/2021   NEUTROABS 3.9 01/19/2021   HGB 12.8 01/19/2021   HCT 38.7 01/19/2021   MCV 94.2 01/19/2021   PLT 241 01/19/2021   Lab Results  Component Value Date   TOTALPROTELP 7.4 01/19/2021   ALBUMINELP 3.8 01/19/2021    A1GS 0.3 01/19/2021   A2GS 1.0 01/19/2021   BETS 1.0 01/19/2021   GAMS 1.4 01/19/2021   MSPIKE 0.7 (H) 01/19/2021   SPEI Comment 01/19/2021     STUDIES: No results found.  ASSESSMENT: MGUS  PLAN:    1.  MGUS: Upon review of patient's chart, her M spike was 0.3 in August 2013 and then 0.5 in March 2015.  Her most recent M spike remained stable at 0.7 which is essentially unchanged her most recent M spike was reported 0.7 which is unchanged since April 2017.  Immunoglobulins are within normal limits and kappa chains are elevated, but stable at 51.6.  She continues to have a chronically elevated elevated creatinine, but this is more likely related to her underlying diabetes and hypertension. Previously, metastatic bone survey was reported as negative.  Patient does not require bone marrow biopsy.  No intervention is needed.  Return to clinic in 1 year with repeat laboratory work and further evaluation. 2.  Chronic renal insufficiency: Nearly resolved.  Patient's most recent creatinine is 1.08.  Continue follow-up with nephrology as scheduled. 3.  Hyperglycemia: Patient continues to have improved blood glucose control. 4.  Peripheral neuropathy: Secondary to diabetes.  Patient does not complain of this today.    I spent a total of 20 minutes reviewing chart data, face-to-face evaluation with the patient, counseling and coordination of care as detailed above.    Patient expressed understanding and was in agreement with this plan. She also understands that She can call clinic at any time with any questions, concerns, or complaints.   Lloyd Huger, MD   01/26/2021 11:39 AM

## 2021-01-20 LAB — IGG, IGA, IGM
IgA: 65 mg/dL — ABNORMAL LOW (ref 87–352)
IgG (Immunoglobin G), Serum: 1340 mg/dL (ref 586–1602)
IgM (Immunoglobulin M), Srm: 124 mg/dL (ref 26–217)

## 2021-01-20 LAB — KAPPA/LAMBDA LIGHT CHAINS
Kappa free light chain: 51.6 mg/L — ABNORMAL HIGH (ref 3.3–19.4)
Kappa, lambda light chain ratio: 2.15 — ABNORMAL HIGH (ref 0.26–1.65)
Lambda free light chains: 24 mg/L (ref 5.7–26.3)

## 2021-01-23 LAB — PROTEIN ELECTROPHORESIS, SERUM
A/G Ratio: 1.1 (ref 0.7–1.7)
Albumin ELP: 3.8 g/dL (ref 2.9–4.4)
Alpha-1-Globulin: 0.3 g/dL (ref 0.0–0.4)
Alpha-2-Globulin: 1 g/dL (ref 0.4–1.0)
Beta Globulin: 1 g/dL (ref 0.7–1.3)
Gamma Globulin: 1.4 g/dL (ref 0.4–1.8)
Globulin, Total: 3.6 g/dL (ref 2.2–3.9)
M-Spike, %: 0.7 g/dL — ABNORMAL HIGH
Total Protein ELP: 7.4 g/dL (ref 6.0–8.5)

## 2021-01-26 ENCOUNTER — Inpatient Hospital Stay: Payer: Medicare HMO | Admitting: Oncology

## 2021-01-26 ENCOUNTER — Encounter: Payer: Self-pay | Admitting: Oncology

## 2021-01-26 ENCOUNTER — Other Ambulatory Visit: Payer: Self-pay

## 2021-01-26 VITALS — BP 136/80 | HR 87 | Temp 97.1°F | Resp 16 | Wt 155.6 lb

## 2021-01-26 DIAGNOSIS — E1142 Type 2 diabetes mellitus with diabetic polyneuropathy: Secondary | ICD-10-CM | POA: Diagnosis not present

## 2021-01-26 DIAGNOSIS — Z79899 Other long term (current) drug therapy: Secondary | ICD-10-CM | POA: Diagnosis not present

## 2021-01-26 DIAGNOSIS — N189 Chronic kidney disease, unspecified: Secondary | ICD-10-CM | POA: Diagnosis not present

## 2021-01-26 DIAGNOSIS — D472 Monoclonal gammopathy: Secondary | ICD-10-CM | POA: Diagnosis not present

## 2021-01-26 DIAGNOSIS — Z7982 Long term (current) use of aspirin: Secondary | ICD-10-CM | POA: Diagnosis not present

## 2021-01-26 DIAGNOSIS — Z794 Long term (current) use of insulin: Secondary | ICD-10-CM | POA: Diagnosis not present

## 2021-01-26 DIAGNOSIS — E1165 Type 2 diabetes mellitus with hyperglycemia: Secondary | ICD-10-CM | POA: Diagnosis not present

## 2021-02-15 ENCOUNTER — Ambulatory Visit: Payer: Medicare HMO

## 2021-02-27 ENCOUNTER — Encounter: Payer: Self-pay | Admitting: Nurse Practitioner

## 2021-02-27 ENCOUNTER — Ambulatory Visit (INDEPENDENT_AMBULATORY_CARE_PROVIDER_SITE_OTHER): Payer: Medicare HMO | Admitting: Nurse Practitioner

## 2021-02-27 ENCOUNTER — Other Ambulatory Visit: Payer: Self-pay

## 2021-02-27 VITALS — BP 123/74 | HR 98 | Temp 98.4°F | Ht 59.0 in | Wt 154.8 lb

## 2021-02-27 DIAGNOSIS — F324 Major depressive disorder, single episode, in partial remission: Secondary | ICD-10-CM

## 2021-02-27 DIAGNOSIS — K219 Gastro-esophageal reflux disease without esophagitis: Secondary | ICD-10-CM

## 2021-02-27 DIAGNOSIS — N183 Chronic kidney disease, stage 3 unspecified: Secondary | ICD-10-CM | POA: Diagnosis not present

## 2021-02-27 DIAGNOSIS — Z794 Long term (current) use of insulin: Secondary | ICD-10-CM | POA: Diagnosis not present

## 2021-02-27 DIAGNOSIS — N1832 Chronic kidney disease, stage 3b: Secondary | ICD-10-CM | POA: Diagnosis not present

## 2021-02-27 DIAGNOSIS — E559 Vitamin D deficiency, unspecified: Secondary | ICD-10-CM

## 2021-02-27 DIAGNOSIS — M1A39X0 Chronic gout due to renal impairment, multiple sites, without tophus (tophi): Secondary | ICD-10-CM | POA: Diagnosis not present

## 2021-02-27 DIAGNOSIS — E6609 Other obesity due to excess calories: Secondary | ICD-10-CM

## 2021-02-27 DIAGNOSIS — E1159 Type 2 diabetes mellitus with other circulatory complications: Secondary | ICD-10-CM | POA: Diagnosis not present

## 2021-02-27 DIAGNOSIS — I152 Hypertension secondary to endocrine disorders: Secondary | ICD-10-CM

## 2021-02-27 DIAGNOSIS — Z6831 Body mass index (BMI) 31.0-31.9, adult: Secondary | ICD-10-CM

## 2021-02-27 DIAGNOSIS — D472 Monoclonal gammopathy: Secondary | ICD-10-CM

## 2021-02-27 DIAGNOSIS — E538 Deficiency of other specified B group vitamins: Secondary | ICD-10-CM

## 2021-02-27 DIAGNOSIS — E1169 Type 2 diabetes mellitus with other specified complication: Secondary | ICD-10-CM

## 2021-02-27 DIAGNOSIS — N6323 Unspecified lump in the left breast, lower outer quadrant: Secondary | ICD-10-CM

## 2021-02-27 DIAGNOSIS — E785 Hyperlipidemia, unspecified: Secondary | ICD-10-CM

## 2021-02-27 DIAGNOSIS — R69 Illness, unspecified: Secondary | ICD-10-CM | POA: Diagnosis not present

## 2021-02-27 DIAGNOSIS — R251 Tremor, unspecified: Secondary | ICD-10-CM | POA: Diagnosis not present

## 2021-02-27 DIAGNOSIS — M8588 Other specified disorders of bone density and structure, other site: Secondary | ICD-10-CM

## 2021-02-27 DIAGNOSIS — Z0000 Encounter for general adult medical examination without abnormal findings: Secondary | ICD-10-CM

## 2021-02-27 DIAGNOSIS — E1122 Type 2 diabetes mellitus with diabetic chronic kidney disease: Secondary | ICD-10-CM | POA: Diagnosis not present

## 2021-02-27 LAB — BAYER DCA HB A1C WAIVED: HB A1C (BAYER DCA - WAIVED): 6.5 % — ABNORMAL HIGH (ref 4.8–5.6)

## 2021-02-27 MED ORDER — METOPROLOL SUCCINATE ER 25 MG PO TB24: 25.0000 mg | ORAL_TABLET | Freq: Every day | ORAL | 4 refills | Status: DC

## 2021-02-27 MED ORDER — SERTRALINE HCL 100 MG PO TABS: 100.0000 mg | ORAL_TABLET | Freq: Every morning | ORAL | 4 refills | Status: DC

## 2021-02-27 MED ORDER — LEVEMIR FLEXTOUCH 100 UNIT/ML ~~LOC~~ SOPN: PEN_INJECTOR | SUBCUTANEOUS | 4 refills | Status: DC

## 2021-02-27 MED ORDER — NOVOLOG FLEXPEN 100 UNIT/ML ~~LOC~~ SOPN
PEN_INJECTOR | SUBCUTANEOUS | 4 refills | Status: DC
Start: 2021-02-27 — End: 2021-08-28

## 2021-02-27 MED ORDER — SIMVASTATIN 40 MG PO TABS: ORAL_TABLET | ORAL | 4 refills | Status: DC

## 2021-02-27 MED ORDER — VICTOZA 18 MG/3ML ~~LOC~~ SOPN: PEN_INJECTOR | SUBCUTANEOUS | 4 refills | Status: DC

## 2021-02-27 MED ORDER — ALLOPURINOL 100 MG PO TABS: 100.0000 mg | ORAL_TABLET | Freq: Every day | ORAL | 4 refills | Status: DC

## 2021-02-27 MED ORDER — ENALAPRIL MALEATE 2.5 MG PO TABS: 2.5000 mg | ORAL_TABLET | Freq: Every day | ORAL | 4 refills | Status: DC

## 2021-02-27 NOTE — Assessment & Plan Note (Signed)
Check Vitamin D level and adjust as needed.

## 2021-02-27 NOTE — Assessment & Plan Note (Signed)
Ongoing since 2012 per patient report, with family history of similar in her mother.  Referral to neurology for further evaluation.

## 2021-02-27 NOTE — Patient Instructions (Signed)
Tremor A tremor is trembling or shaking that you cannot control. Most tremors affect the hands or arms. Tremors can also affect the head, vocal cords, face, and other parts of the body. There are many types of tremors. Common types include: Essential tremor. These usually occur in people older than 40. It may run in families and can happen in otherwise healthy people. Resting tremor. These occur when the muscles are at rest, such as when your hands are resting in your lap. People with Parkinson's disease often have resting tremors. Postural tremor. These occur when you try to hold a pose, such as keeping your hands outstretched. Kinetic tremor. These occur during purposeful movement, such as trying to touch a finger to your nose. Task-specific tremor. These may occur when you perform certain tasks such as writing, speaking, or standing. Psychogenic tremor. These dramatically lessen or disappear when you are distracted. They can happen in people of all ages. Some types of tremors have no known cause. Tremors can also be a symptom of nervous system problems (neurological disorders) that may occur with aging. Some tremors go away with treatment, while others do not. Follow these instructions at home: Lifestyle   Limit alcohol intake to no more than 1 drink a day for nonpregnant women and 2 drinks a day for men. One drink equals 12 oz of beer, 5 oz of wine, or 1 oz of hard liquor. Do not use any products that contain nicotine or tobacco, such as cigarettes and e-cigarettes. If you need help quitting, ask your health care provider. Avoid extreme heat and extreme cold. Limit your caffeine intake, as told by your health care provider. Try to get 8 hours of sleep each night. Find ways to manage your stress, such as meditation or yoga. General instructions Take over-the-counter and prescription medicines only as told by your health care provider. Keep all follow-up visits as told by your health care  provider. This is important. Contact a health care provider if you: Develop a tremor after starting a new medicine. Have a tremor along with other symptoms such as: Numbness. Tingling. Pain. Weakness. Notice that your tremor gets worse. Notice that your tremor interferes with your day-to-day life. Summary A tremor is trembling or shaking that you cannot control. Most tremors affect the hands or arms. Some types of tremors have no known cause. Others may be a symptom of nervous system problems (neurological disorders). Make sure you discuss any tremors you have with your health care provider. This information is not intended to replace advice given to you by your health care provider. Make sure you discuss any questions you have with your health care provider. Document Revised: 11/25/2019 Document Reviewed: 11/27/2019 Elsevier Patient Education  2022 [employer].

## 2021-02-27 NOTE — Assessment & Plan Note (Signed)
Chronic, ongoing.  A1C 6.5% today.  Continue Enalapril for kidney protection and continue to renal dose medications as needed.  Continue to collaborate with nephrology.

## 2021-02-27 NOTE — Assessment & Plan Note (Signed)
Ongoing, stable.  Continue Vitamin D and calcium daily.  Check Vit D level today.  Repeat DEXA in July 2025.   

## 2021-02-27 NOTE — Assessment & Plan Note (Signed)
BMI 31.27.  Recommended eating smaller high protein, low fat meals more frequently and exercising 30 mins a day 5 times a week with a goal of 10-15lb weight loss in the next 3 months. Patient voiced their understanding and motivation to adhere to these recommendations.  

## 2021-02-27 NOTE — Assessment & Plan Note (Signed)
Continue to collaborate and review records from Dr. Bary Castilla.

## 2021-02-27 NOTE — Progress Notes (Signed)
BP 123/74   Pulse 98   Temp 98.4 F (36.9 C)   Ht 4' 11" (1.499 m)   Wt 154 lb 12.8 oz (70.2 kg)   LMP  (LMP Unknown)   SpO2 96%   BMI 31.27 kg/m    Subjective:    Patient ID: Kara Leach, female    DOB: 04/10/1950, 70 y.o.   MRN: [id number]  HPI: Kara Leach is a 70 y.o. female presenting on 02/27/2021 for comprehensive medical examination. Current medical complaints include:none  She currently lives with: self Menopausal Symptoms: no  TREMORS: Her grandmother had a familial essential tremor and patient reports she has noticed more tremor in hands bilaterally recently at rest and with activity, makes it hard to eat sometimes.  She feels off balance at times on feet.  Tremors have been present for a long time (2012), but she feels they are getting worse.  Sometimes feels like food gets stuck in throat, but takes Pepcid daily and this helps.  DIABETES Last A1C 6.8% in June.  Continues on Novolog pre meal and Levemir + Victoza 1.2 MG daily. Has been cheating a little with foods. Hypoglycemic episodes:no Polydipsia/polyuria: no Visual disturbance: no Chest pain: no Paresthesias: no Glucose Monitoring: yes             Accucheck frequency: BID             Fasting glucose: 108 this morning (averaging 142 range)             Post prandial:             Evening: last night was 148             Before meals: Taking Insulin?: yes             Long acting insulin: Levemir 50 units             Short acting insulin: Novolog 12-14 units Blood Pressure Monitoring: daily Retinal Examination: Up To Date Foot Exam: Up to Date Pneumovax: Up to Date Influenza: Up to Date Aspirin: yes    HYPERTENSION / HYPERLIPIDEMIA Continues on Enalapril, Metoprolol, and ASA + Simvastatin.  Amlodipine taken in past, but discontinued due to stable BP levels. Satisfied with current treatment? yes Duration of hypertension: chronic BP monitoring frequency: daily BP range: <130/80 on average at  home BP medication side effects: no Duration of hyperlipidemia: chronic Cholesterol medication side effects: no Cholesterol supplements: none Medication compliance: good compliance Aspirin: yes Recent stressors: no Recurrent headaches: no Visual changes: no Palpitations: no Dyspnea: no Chest pain: no Lower extremity edema: occasionally to feet, if up on feet a lot Dizzy/lightheaded: no The 10-year ASCVD risk score (Arnett DK, et al., 2019) is: 16.1%   Values used to calculate the score:     Age: 51 years     Sex: Female     Is Non-Hispanic African American: No     Diabetic: Yes     Tobacco smoker: No     Systolic Blood Pressure: 224 mmHg     Is BP treated: Yes     HDL Cholesterol: 42 mg/dL     Total Cholesterol: 131 mg/dL  CHRONIC KIDNEY DISEASE With GFR ranging from 39-60 over past two years.  Recent GFR 56 in November 2022.  Does see Dr. Holley Raring, last visit 11/15/20.   CKD status: stable Medications renally dose: yes Previous renal evaluation: no Pneumovax:  Up to Date Influenza Vaccine:  Up to Date  OSTEOPENIA: Continues on daily calcium and Vitamin D supplements.  Noted on DEXA in July 2020.   Satisfied with current treatment?: yes Adequate calcium & vitamin D: yes Weight bearing exercises: yes    MGUS: Followed by Dr. Grayland Ormond at [facility] and last seen 01/26/21.  To continue yearly visits and labs with them.  She also follows with Dr. Bary Castilla after her mammograms for history of biopsies, last seen 05/05/20.   GOUT Continues on Allopurinol 100 MG daily.  Has not had gout flare in several years. Duration:chronic Swelling: no Redness: no Trauma: no Recent dietary change or indiscretion: no Fevers: no Nausea/vomiting: no Status:  stable Treatments attempted: Allopurinol   DEPRESSION Continues on Sertraline 100 MG.  Denies SI/HI.  Winter months and holidays are a little tougher.   Mood status: stable Satisfied with current treatment?: yes Symptom severity:  moderate  Duration of current treatment : chronic Side effects: no Medication compliance: good compliance Psychotherapy/counseling: none Previous psychiatric medications: Zoloft Depressed mood: yes Anxious mood: yes Anhedonia: no Significant weight loss or gain: no Insomnia: none Fatigue: no Feelings of worthlessness or guilt: no Impaired concentration/indecisiveness: yes Suicidal ideations: no Hopelessness: no Crying spells: no Depression screen [facility] 2/9 02/27/2021 11/23/2020 08/25/2020 05/25/2020 02/25/2020  Decreased Interest 1 0 0 0 1  Down, Depressed, Hopeless _0 PHQ - 2 Score _1 Altered sleeping _2 Tired, decreased energy _3 Change in appetite 0 0 0 0 0  Feeling bad or failure about yourself  2 0 0 1 1  Trouble concentrating 0 0 0 0 0  Moving slowly or fidgety/restless 0 0 0 0 0  Suicidal thoughts 0 0 0 0 0  PHQ-9 Score _4 Difficult doing work/chores Somewhat difficult Not difficult at all Somewhat difficult - -  Some recent data might be hidden   GAD 7 : Generalized Anxiety Score 02/27/2021 02/11/2019  Nervous, Anxious, on Edge 0 3  Control/stop worrying 0 3  Worry too much - different things 0 3  Trouble relaxing 0 3  Restless 0 2  Easily annoyed or irritable 0 2  Afraid - awful might happen 0 2  Total GAD 7 Score 0 18  Anxiety Difficulty Not difficult at all Somewhat difficult    The patient does not have a history of falls. I did not complete a risk assessment for falls. A plan of care for falls was not documented.   Past Medical History:  Past Medical History:  Diagnosis Date   Allergy    Anxiety    Arthritis    Osteoarthritis   Cancer ([place]) 05/2018   possibly on left breast but not sure   Chronic kidney disease    Depression    Diabetes mellitus without complication ([place]) 8441   Diabetic neuropathy ([place])    feet   Diverticulitis 2017   GERD (gastroesophageal reflux disease)    Headache    sinus and migraines  on occasion   Hyperlipidemia    Hypertension    Last menstrual period (LMP) > 10 days ago 1986   Obesity    Peripheral vascular disease (HCC)    neuropathy in hands and feet d/t diabetes   Renal insufficiency    Stage 2 kidney disease   Seizures ([place])    due to high blood sugars. last time = 09/2014   Sleep apnea    has  CPAP, can't tolerate   Stroke ([place]) 1980   minor. left side remains weaker than right   Wears hearing aid    right side   Wrist weakness    left - after fracture and repair    Surgical History:  Past Surgical History:  Procedure Laterality Date   [place] Left 07/03/2016   benign, fibrocystic changes with calcifications, usual ductal hyperplasia.    BREAST BIOPSY Left 04/23/2018   radial scar   BREAST BIOPSY Left 08/13/2018   Procedure: WIDE LOCALIZATION AND OPEN BIOPSY OF LEFT BREAST, DIABETIC;  Surgeon: Robert Bellow, MD;  Location: [facility] ORS;  Service: General;  Laterality: Left;   BREAST EXCISIONAL BIOPSY Left 08/13/2018   CHOLECYSTECTOMY  2012   COLONOSCOPY WITH PROPOFOL N/A 12/01/2015   Procedure: COLONOSCOPY WITH PROPOFOL;  Surgeon: [name], MD;  Location: Center;  Service: Endoscopy;  Laterality: N/A;  Diabetic - insulin Sleep apnea   PATELLA FRACTURE SURGERY Left    1. had to recenter kneecap, 2 and 3 surgeries were arthroscopies   WRIST FRACTURE SURGERY Left 2005   Steel plate    Medications:  Current Outpatient Medications on File Prior to Visit  Medication Sig   acetaminophen (TYLENOL) 650 MG CR tablet Take 650 mg by mouth every 8 (eight) hours as needed for pain.   aspirin EC 81 MG tablet Take 81 mg by mouth daily.   BD PEN NEEDLE NANO U/F 32G X 4 MM MISC USE EVERY MORNING   Blood Glucose Monitoring Suppl (ONETOUCH VERIO REFLECT) w/Device KIT USE TO CHECK BLOOD SUGAR DAILY   Calcium Carb-Cholecalciferol (CALCIUM 600 + D PO) Take 1 tablet by mouth 2 (two) times  daily. 1000 IU VITAMIN D   Famotidine (PEPCID PO) Take 10 mg by mouth 2 (two) times daily.    fluticasone (FLONASE) 50 MCG/ACT nasal spray PLACE 2 SPRAYS INTO BOTH NOSTRILS DAILY AS NEEDED FOR ALLERGIES.   Lancets (ONETOUCH DELICA PLUS [id number]) MISC Use to check blood sugars 3 to 4 times a day.   ONETOUCH ULTRA test strip USE TO CHECK BLOOD SUGAR DAILY   Specialty Vitamins Products (VITA-RX DIABETIC VITAMIN) CAPS Take 1 packet by mouth daily. Diabetic Vitamins - 6 or 7 in 1 pack   No current facility-administered medications on file prior to visit.    Allergies:  Allergies  Allergen Reactions   Strawberry Extract Swelling    Lips swell and rash/whelps over body Difficulty breathing   Ibuprofen Other (See Comments)    "ringing in ears", dizziness Also, Advil / Aleve / Motrin   Cat Hair Extract Other (See Comments)    Sneezing    Social History:  Social History   Socioeconomic History   Marital status: Widowed    Spouse name: Not on file   Number of children: Not on file   Years of education: Not on file   Highest education level: Not on file  Occupational History   Occupation: worked at [employer] for 41 years    Comment: retired  Tobacco Use   Smoking status: Never   Smokeless tobacco: Never  Scientific laboratory technician Use: Never used  Substance and Sexual Activity   Alcohol use: Not Currently    Alcohol/week: 0.0 standard drinks    Comment: occasionally holiday, weddings   Drug use: No   Sexual activity: Never  Other Topics Concern   Not on file  Social History Narrative  Not on file   Social Determinants of Health   Financial Resource Strain: Not on file  Food Insecurity: Not on file  Transportation Needs: Not on file  Physical Activity: Not on file  Stress: Not on file  Social Connections: Not on file  Intimate Partner Violence: Not on file   Social History   Tobacco Use  Smoking Status Never  Smokeless Tobacco Never   Social History   Substance and  Sexual Activity  Alcohol Use Not Currently   Alcohol/week: 0.0 standard drinks   Comment: occasionally holiday, weddings    Family History:  Family History  Problem Relation Age of Onset   Diabetes Mother    Heart disease Mother    Hypertension Mother    Diabetes Sister    Cancer Father        stomach   Arthritis Sister    Stroke Sister    Hypertension Sister    Breast cancer Maternal Aunt 27       great aunt    Past medical history, surgical history, medications, allergies, family history and social history reviewed with patient today and changes made to appropriate areas of the chart.   ROS All other ROS negative except what is listed above and in the HPI.      Objective:    BP 123/74   Pulse 98   Temp 98.4 F (36.9 C)   Ht $R'4\' 11"'VV$  (1.499 m)   Wt 154 lb 12.8 oz (70.2 kg)   LMP  (LMP Unknown)   SpO2 96%   BMI 31.27 kg/m   Wt Readings from Last 3 Encounters:  02/27/21 154 lb 12.8 oz (70.2 kg)  01/26/21 155 lb 9.6 oz (70.6 kg)  11/23/20 152 lb (68.9 kg)    Physical Exam Vitals and nursing note reviewed. Exam conducted with a chaperone present.  Constitutional:      General: She is awake. She is not in acute distress.    Appearance: She is well-developed. She is not ill-appearing.  HENT:     Head: Normocephalic and atraumatic.     Right Ear: Hearing, tympanic membrane, ear canal and external ear normal. No drainage.     Left Ear: Hearing, tympanic membrane, ear canal and external ear normal. No drainage.     Nose: Nose normal.     Right Sinus: No maxillary sinus tenderness or frontal sinus tenderness.     Left Sinus: No maxillary sinus tenderness or frontal sinus tenderness.     Mouth/Throat:     Mouth: Mucous membranes are moist.     Pharynx: Oropharynx is clear. Uvula midline. No pharyngeal swelling, oropharyngeal exudate or posterior oropharyngeal erythema.  Eyes:     General: Lids are normal.        Right eye: No discharge.        Left eye: No  discharge.     Extraocular Movements: Extraocular movements intact.     Conjunctiva/sclera: Conjunctivae normal.     Pupils: Pupils are equal, round, and reactive to light.     Visual Fields: Right eye visual fields normal and left eye visual fields normal.  Neck:     Thyroid: No thyromegaly.     Vascular: No carotid bruit.     Trachea: Trachea normal.  Cardiovascular:     Rate and Rhythm: Normal rate and regular rhythm.     Heart sounds: Normal heart sounds. No murmur heard.   No gallop.  Pulmonary:     Effort: Pulmonary effort is  normal. No accessory muscle usage or respiratory distress.     Breath sounds: Normal breath sounds.  Chest:     Comments: Deferred per request, has performed with Dr. Bary Castilla. Abdominal:     General: Bowel sounds are normal.     Palpations: Abdomen is soft. There is no hepatomegaly or splenomegaly.     Tenderness: There is no abdominal tenderness.  Musculoskeletal:        General: Normal range of motion.     Cervical back: Normal range of motion and neck supple.     Right lower leg: No edema.     Left lower leg: No edema.  Lymphadenopathy:     Head:     Right side of head: No submental, submandibular, tonsillar, preauricular or posterior auricular adenopathy.     Left side of head: No submental, submandibular, tonsillar, preauricular or posterior auricular adenopathy.     Cervical: No cervical adenopathy.  Skin:    General: Skin is warm and dry.     Capillary Refill: Capillary refill takes less than 2 seconds.     Findings: No rash.  Neurological:     Mental Status: She is alert and oriented to person, place, and time.     Cranial Nerves: Cranial nerves 2-12 are intact.     Motor: Tremor (bilateral upper extremity with rest.) present. No weakness.     Coordination: Coordination is intact.     Gait: Gait is intact.     Deep Tendon Reflexes: Reflexes are normal and symmetric.     Reflex Scores:      Brachioradialis reflexes are 2+ on the right  side and 2+ on the left side.      Patellar reflexes are 2+ on the right side and 2+ on the left side.    Comments: No cogwheel noted and no gait changes.  Psychiatric:        Attention and Perception: Attention normal.        Mood and Affect: Mood normal.        Speech: Speech normal.        Behavior: Behavior normal. Behavior is cooperative.        Thought Content: Thought content normal.        Judgment: Judgment normal.   Diabetic Foot Exam - Simple   Simple Foot Form Visual Inspection No deformities, no ulcerations, no other skin breakdown bilaterally: Yes Sensation Testing Intact to touch and monofilament testing bilaterally: Yes Pulse Check Posterior Tibialis and Dorsalis pulse intact bilaterally: Yes Comments     Results for orders placed or performed in visit on 01/19/21  IgG, IgA, IgM  Result Value Ref Range   IgG (Immunoglobin G), Serum 1,340 586 - 1,602 mg/dL   IgA 65 (L) 87 - 352 mg/dL   IgM (Immunoglobulin M), Srm 124 26 - 217 mg/dL  Kappa/lambda light chains  Result Value Ref Range   Kappa free light chain 51.6 (H) 3.3 - 19.4 mg/L   Lambda free light chains 24.0 5.7 - 26.3 mg/L   Kappa, lambda light chain ratio 2.15 (H) 0.26 - 1.65  Protein electrophoresis, serum  Result Value Ref Range   Total Protein ELP 7.4 6.0 - 8.5 g/dL   Albumin ELP 3.8 2.9 - 4.4 g/dL   Alpha-1-Globulin 0.3 0.0 - 0.4 g/dL   Alpha-2-Globulin 1.0 0.4 - 1.0 g/dL   Beta Globulin 1.0 0.7 - 1.3 g/dL   Gamma Globulin 1.4 0.4 - 1.8 g/dL   M-Spike, % 0.7 (H)  Not Observed g/dL   SPE Interp. Comment    Comment Comment    Globulin, Total 3.6 2.2 - 3.9 g/dL   A/G Ratio 1.1 0.7 - 1.7  Basic metabolic panel  Result Value Ref Range   Sodium 137 135 - 145 mmol/L   Potassium 4.2 3.5 - 5.1 mmol/L   Chloride 102 98 - 111 mmol/L   CO2 28 22 - 32 mmol/L   Glucose, Bld 68 (L) 70 - 99 mg/dL   BUN 22 8 - 23 mg/dL   Creatinine, Ser 1.08 (H) 0.44 - 1.00 mg/dL   Calcium 9.7 8.9 - 10.3 mg/dL   GFR,  Estimated 56 (L) >60 mL/min   Anion gap 7 5 - 15  CBC with Differential/Platelet  Result Value Ref Range   WBC 9.5 4.0 - 10.5 K/uL   RBC 4.11 3.87 - 5.11 MIL/uL   Hemoglobin 12.8 12.0 - 15.0 g/dL   HCT 38.7 36.0 - 46.0 %   MCV 94.2 80.0 - 100.0 fL   MCH 31.1 26.0 - 34.0 pg   MCHC 33.1 30.0 - 36.0 g/dL   RDW 13.0 11.5 - 15.5 %   Platelets 241 150 - 400 K/uL   nRBC 0.0 0.0 - 0.2 %   Neutrophils Relative % 41 %   Neutro Abs 3.9 1.7 - 7.7 K/uL   Lymphocytes Relative 49 %   Lymphs Abs 4.6 (H) 0.7 - 4.0 K/uL   Monocytes Relative 7 %   Monocytes Absolute 0.7 0.1 - 1.0 K/uL   Eosinophils Relative 2 %   Eosinophils Absolute 0.2 0.0 - 0.5 K/uL   Basophils Relative 1 %   Basophils Absolute 0.1 0.0 - 0.1 K/uL   Immature Granulocytes 0 %   Abs Immature Granulocytes 0.02 0.00 - 0.07 K/uL      Assessment & Plan:   Problem List Items Addressed This Visit       Cardiovascular and Mediastinum   Hypertension associated with diabetes (HCC) (Chronic)    Chronic, ongoing with CKD 3.  BP at goal home and office.  At this time will continue current medication regimen and adjust as needed.  Alert provider if BP consistently >130/90 at home and then may restart Amlodipine at 2.5 MG.  Continue Metoprolol and Enalapril and adjust as needed.  Focus on DASH diet.  Enalapril for kidney protection.  CMP, CBC, TSH today.  Return in 3 months.      Relevant Medications   enalapril (VASOTEC) 2.5 MG tablet   insulin aspart (NOVOLOG FLEXPEN) 100 UNIT/ML FlexPen   insulin detemir (LEVEMIR FLEXTOUCH) 100 UNIT/ML FlexPen   liraglutide (VICTOZA) 18 MG/3ML SOPN   simvastatin (ZOCOR) 40 MG tablet   metoprolol succinate (TOPROL-XL) 25 MG 24 hr tablet   Other Relevant Orders   Comprehensive metabolic panel   TSH   Bayer DCA Hb A1c Waived   Microalbumin, Urine Waived     Digestive   Gastroesophageal reflux disease without esophagitis (Chronic)    Chronic, stable.  Continue current medication regimen,  Famotidine, and check Mag level today.        Relevant Orders   Magnesium     Endocrine   CKD stage 3 due to type 2 diabetes mellitus (HCC) (Chronic)    Chronic, ongoing.  A1C 6.5% today.  Continue Enalapril for kidney protection and continue to renal dose medications as needed.  Continue to collaborate with nephrology.      Relevant Medications   enalapril (VASOTEC) 2.5 MG tablet  insulin aspart (NOVOLOG FLEXPEN) 100 UNIT/ML FlexPen   insulin detemir (LEVEMIR FLEXTOUCH) 100 UNIT/ML FlexPen   liraglutide (VICTOZA) 18 MG/3ML SOPN   simvastatin (ZOCOR) 40 MG tablet   Other Relevant Orders   Comprehensive metabolic panel   Bayer DCA Hb A1c Waived   Microalbumin, Urine Waived   Hyperlipidemia associated with type 2 diabetes mellitus (HCC) (Chronic)    Chronic, ongoing.  Continue current medication regimen and adjust as needed.  Lipid panel today, recent LDL <70.      Relevant Medications   enalapril (VASOTEC) 2.5 MG tablet   insulin aspart (NOVOLOG FLEXPEN) 100 UNIT/ML FlexPen   insulin detemir (LEVEMIR FLEXTOUCH) 100 UNIT/ML FlexPen   liraglutide (VICTOZA) 18 MG/3ML SOPN   simvastatin (ZOCOR) 40 MG tablet   metoprolol succinate (TOPROL-XL) 25 MG 24 hr tablet   Other Relevant Orders   Comprehensive metabolic panel   Lipid Panel w/o Chol/HDL Ratio   Bayer DCA Hb A1c Waived   Type 2 diabetes mellitus with stage 3b chronic kidney disease, with long-term current use of insulin (HCC) - Primary (Chronic)    Chronic, ongoing with A1C 6.5% today and urine ALB 10 on check this year, will recheck when patient able to return sample.  Recommend she continue Levemir and Victoza at current doses.  Will consider change to [place] and maximizing Victoza dependent on future A1C readings.  CMP today.  Continue to check BS at home daily and document + continue collaboration with nephrology.  Return in 3 months.      Relevant Medications   enalapril (VASOTEC) 2.5 MG tablet   insulin aspart  (NOVOLOG FLEXPEN) 100 UNIT/ML FlexPen   insulin detemir (LEVEMIR FLEXTOUCH) 100 UNIT/ML FlexPen   liraglutide (VICTOZA) 18 MG/3ML SOPN   simvastatin (ZOCOR) 40 MG tablet   Other Relevant Orders   Comprehensive metabolic panel   Bayer DCA Hb A1c Waived   Microalbumin, Urine Waived     Musculoskeletal and Integument   Osteopenia of spine (Chronic)    Ongoing, stable.  Continue Vitamin D and calcium daily.  Check Vit D level today.  Repeat DEXA in July 2025.          Other   MGUS (monoclonal gammopathy of unknown significance) (Chronic)    Continue collaboration with Dr. Grayland Ormond at [facility].      Obesity (Chronic)    BMI 31.27.  Recommended eating smaller high protein, low fat meals more frequently and exercising 30 mins a day 5 times a week with a goal of 10-15lb weight loss in the next 3 months. Patient voiced their understanding and motivation to adhere to these recommendations.       Relevant Medications   insulin aspart (NOVOLOG FLEXPEN) 100 UNIT/ML FlexPen   insulin detemir (LEVEMIR FLEXTOUCH) 100 UNIT/ML FlexPen   liraglutide (VICTOZA) 18 MG/3ML SOPN   B12 deficiency    Chronic, stable.  Continue supplement at home and recheck level today.      Relevant Orders   CBC with Differential/Platelet   Vitamin B12   Chronic gout due to renal impairment of multiple sites without tophus    Chronic, ongoing.  Continue Allopurinol 100 MG daily.  Discussed at length with patient.  Check uric acid level today.      Relevant Orders   Uric acid   Depression, major, single episode, in partial remission (HCC)    Chronic, stable.  Denies SI/HI.  Continue current medication regimen, Sertraline 100 MG daily, and adjust as needed. Recommend meditation and relaxation techniques  at home.      Relevant Medications   sertraline (ZOLOFT) 100 MG tablet   Mass of lower outer quadrant of left breast    Continue to collaborate and review records from Dr. Bary Castilla.      Tremor of both hands     Ongoing since 2012 per patient report, with family history of similar in her mother.  Referral to neurology for further evaluation.        Relevant Orders   Ambulatory referral to Neurology   Vitamin D deficiency    Check Vitamin D level and adjust as needed.      Relevant Orders   VITAMIN D 25 Hydroxy (Vit-D Deficiency, Fractures)   Other Visit Diagnoses     Encounter for annual physical exam            Follow up plan: Return in about 3 months (around 05/28/2021) for T2DM, HTN/HLD, TREMORS, MOOD, MGUS.   LABORATORY TESTING:  - Pap smear: not applicable  IMMUNIZATIONS:   - Tdap: Tetanus vaccination status reviewed: provide next visit. - Influenza: Up to date - Pneumovax: Up to date - Prevnar: Up to date - COVID: Up to date - HPV: Not applicable - Shingrix vaccine: Up to date  SCREENING: -Mammogram: Up to date  - Colonoscopy: Up to date  - Bone Density: Up to date  -Hearing Test: Not applicable  -Spirometry: Not applicable   PATIENT COUNSELING:   Advised to take 1 mg of folate supplement per day if capable of pregnancy.   Sexuality: Discussed sexually transmitted diseases, partner selection, use of condoms, avoidance of unintended pregnancy  and contraceptive alternatives.   Advised to avoid cigarette smoking.  I discussed with the patient that most people either abstain from alcohol or drink within safe limits (<=14/week and <=4 drinks/occasion for males, <=7/weeks and <= 3 drinks/occasion for females) and that the risk for alcohol disorders and other health effects rises proportionally with the number of drinks per week and how often a drinker exceeds daily limits.  Discussed cessation/primary prevention of drug use and availability of treatment for abuse.   Diet: Encouraged to adjust caloric intake to maintain  or achieve ideal body weight, to reduce intake of dietary saturated fat and total fat, to limit sodium intake by avoiding high sodium foods and not  adding table salt, and to maintain adequate dietary potassium and calcium preferably from fresh fruits, vegetables, and low-fat dairy products.    Stressed the importance of regular exercise  Injury prevention: Discussed safety belts, safety helmets, smoke detector, smoking near bedding or upholstery.   Dental health: Discussed importance of regular tooth brushing, flossing, and dental visits.    NEXT PREVENTATIVE PHYSICAL DUE IN 1 YEAR. Return in about 3 months (around 05/28/2021) for T2DM, HTN/HLD, TREMORS, MOOD, MGUS.

## 2021-02-27 NOTE — Assessment & Plan Note (Signed)
Chronic, stable.  Continue current medication regimen, Famotidine, and check Mag level today.   

## 2021-02-27 NOTE — Assessment & Plan Note (Signed)
Chronic, ongoing.  Continue current medication regimen and adjust as needed.  Lipid panel today, recent LDL < 70. 

## 2021-02-27 NOTE — Assessment & Plan Note (Signed)
Chronic, stable.  Continue supplement at home and recheck level today. 

## 2021-02-27 NOTE — Assessment & Plan Note (Signed)
Chronic, stable.  Denies SI/HI.  Continue current medication regimen, Sertraline 100 MG daily, and adjust as needed. Recommend meditation and relaxation techniques at home. 

## 2021-02-27 NOTE — Assessment & Plan Note (Signed)
Chronic, ongoing with CKD 3.  BP at goal home and office.  At this time will continue current medication regimen and adjust as needed.  Alert provider if BP consistently >130/90 at home and then may restart Amlodipine at 2.5 MG.  Continue Metoprolol and Enalapril and adjust as needed.  Focus on DASH diet.  Enalapril for kidney protection.  CMP, CBC, TSH today.  Return in 3 months.

## 2021-02-27 NOTE — Assessment & Plan Note (Signed)
Continue collaboration with Dr. Grayland Ormond at [facility].

## 2021-02-27 NOTE — Assessment & Plan Note (Signed)
Chronic, ongoing with A1C 6.5% today and urine ALB 10 on check this year, will recheck when patient able to return sample.  Recommend she continue Levemir and Victoza at current doses.  Will consider change to [place] and maximizing Victoza dependent on future A1C readings.  CMP today.  Continue to check BS at home daily and document + continue collaboration with nephrology.  Return in 3 months.

## 2021-02-27 NOTE — Assessment & Plan Note (Signed)
Chronic, ongoing.  Continue Allopurinol 100 MG daily.  Discussed at length with patient.  Check uric acid level today. 

## 2021-02-28 LAB — LIPID PANEL W/O CHOL/HDL RATIO
Cholesterol, Total: 127 mg/dL (ref 100–199)
HDL: 45 mg/dL (ref >39)
LDL Chol Calc (NIH): 58 mg/dL (ref 0–99)
Triglycerides: 136 mg/dL (ref 0–149)
VLDL Cholesterol Cal: 24 mg/dL (ref 5–40)

## 2021-02-28 LAB — VITAMIN B12: Vitamin B-12: 660 pg/mL (ref 232–1245)

## 2021-02-28 LAB — CBC WITH DIFFERENTIAL/PLATELET
Basophils Absolute: 0.1 10*3/uL (ref 0.0–0.2)
Basos: 1 %
EOS (ABSOLUTE): 0.2 10*3/uL (ref 0.0–0.4)
Eos: 2 %
Hematocrit: 40.3 % (ref 34.0–46.6)
Hemoglobin: 13.2 g/dL (ref 11.1–15.9)
Immature Grans (Abs): 0 10*3/uL (ref 0.0–0.1)
Immature Granulocytes: 0 %
Lymphocytes Absolute: 3.8 10*3/uL — ABNORMAL HIGH (ref 0.7–3.1)
Lymphs: 40 %
MCH: 30.6 pg (ref 26.6–33.0)
MCHC: 32.8 g/dL (ref 31.5–35.7)
MCV: 94 fL (ref 79–97)
Monocytes Absolute: 0.8 10*3/uL (ref 0.1–0.9)
Monocytes: 8 %
Neutrophils Absolute: 4.8 10*3/uL (ref 1.4–7.0)
Neutrophils: 49 %
Platelets: 246 10*3/uL (ref 150–450)
RBC: 4.31 x10E6/uL (ref 3.77–5.28)
RDW: 13 % (ref 11.7–15.4)
WBC: 9.6 10*3/uL (ref 3.4–10.8)

## 2021-02-28 LAB — COMPREHENSIVE METABOLIC PANEL
ALT: 20 IU/L (ref 0–32)
AST: 27 IU/L (ref 0–40)
Albumin/Globulin Ratio: 1.5 (ref 1.2–2.2)
Albumin: 4.4 g/dL (ref 3.8–4.8)
Alkaline Phosphatase: 114 IU/L (ref 44–121)
BUN/Creatinine Ratio: 14 (ref 12–28)
BUN: 18 mg/dL (ref 8–27)
Bilirubin Total: 0.4 mg/dL (ref 0.0–1.2)
CO2: 24 mmol/L (ref 20–29)
Calcium: 9.4 mg/dL (ref 8.7–10.3)
Chloride: 100 mmol/L (ref 96–106)
Creatinine, Ser: 1.25 mg/dL — ABNORMAL HIGH (ref 0.57–1.00)
Globulin, Total: 2.9 g/dL (ref 1.5–4.5)
Glucose: 126 mg/dL — ABNORMAL HIGH (ref 70–99)
Potassium: 4.7 mmol/L (ref 3.5–5.2)
Sodium: 138 mmol/L (ref 134–144)
Total Protein: 7.3 g/dL (ref 6.0–8.5)
eGFR: 47 mL/min/{1.73_m2} — ABNORMAL LOW (ref >59)

## 2021-02-28 LAB — MICROALBUMIN, URINE WAIVED
Creatinine, Urine Waived: 200 mg/dL (ref 10–300)
Microalb, Ur Waived: 30 mg/L — ABNORMAL HIGH (ref 0–19)
Microalb/Creat Ratio: <30 mg/g (ref <30)

## 2021-02-28 LAB — URIC ACID: Uric Acid: 4.5 mg/dL (ref 3.0–7.2)

## 2021-02-28 LAB — VITAMIN D 25 HYDROXY (VIT D DEFICIENCY, FRACTURES): Vit D, 25-Hydroxy: 47.7 ng/mL (ref 30.0–100.0)

## 2021-02-28 LAB — TSH: TSH: 0.699 u[IU]/mL (ref 0.450–4.500)

## 2021-02-28 LAB — MAGNESIUM: Magnesium: 1.8 mg/dL (ref 1.6–2.3)

## 2021-02-28 NOTE — Progress Notes (Signed)
Good morning, please let Kara Leach know her labs have returned.  Overall labs are remaining stable and at baseline for her.  Kidney function shows no decline, is staying stable.  Continue all current medications and supplements.  Great job!! Keep being awesome!!  Thank you for allowing me to participate in your care.  I appreciate you. Kindest regards, Jhane Lorio

## 2021-03-20 DIAGNOSIS — N1832 Chronic kidney disease, stage 3b: Secondary | ICD-10-CM | POA: Diagnosis not present

## 2021-03-20 DIAGNOSIS — E1122 Type 2 diabetes mellitus with diabetic chronic kidney disease: Secondary | ICD-10-CM | POA: Diagnosis not present

## 2021-03-20 DIAGNOSIS — I10 Essential (primary) hypertension: Secondary | ICD-10-CM | POA: Diagnosis not present

## 2021-03-28 ENCOUNTER — Other Ambulatory Visit: Payer: Self-pay | Admitting: Nurse Practitioner

## 2021-03-28 DIAGNOSIS — Z1231 Encounter for screening mammogram for malignant neoplasm of breast: Secondary | ICD-10-CM

## 2021-04-04 ENCOUNTER — Ambulatory Visit: Payer: Medicare HMO | Admitting: Neurology

## 2021-04-04 ENCOUNTER — Encounter: Payer: Self-pay | Admitting: Neurology

## 2021-04-04 VITALS — BP 133/74 | HR 78 | Ht 59.0 in | Wt 158.0 lb

## 2021-04-04 DIAGNOSIS — R251 Tremor, unspecified: Secondary | ICD-10-CM

## 2021-04-04 DIAGNOSIS — Z820 Family history of epilepsy and other diseases of the nervous system: Secondary | ICD-10-CM

## 2021-04-04 NOTE — Patient Instructions (Addendum)
You have a rather mild tremor of both hands.  I do not see any signs or symptoms of parkinson's like disease or what we call parkinsonism.   For your tremor, I would not recommend any new medication for fear of side effects (especially sleepiness) or medication interactions, especially in light of already being on the beta-blocker, having had balance issues and recently starting a new diabetes medicine, namely [place]. I would like to reevaluate you in about 6 months.  Please remember, that any kind of tremor may be exacerbated by anxiety, anger, nervousness, excitement, dehydration, sleep deprivation, by caffeine, and low blood sugar values or blood sugar fluctuations. Some medications can exacerbate tremors, this includes antidepressant medication such as sertraline.  Please make an appointment with your sleep specialist since you are not using your CPAP, they may be able to adjust the pressure or give you a different mask. Sleeping better may help your tremors as well.  Please continue to hydrate well with water.  Please limit your caffeine to 1 or 2 servings of caffeine containing beverages per day, including soda, hot tea and iced tea.

## 2021-04-04 NOTE — Progress Notes (Signed)
Subjective:    Patient ID: Kara Leach is a 71 y.o. female.  HPI    Star Age, MD, PhD [facility] [address], [address] [place], [place] [address]  Dear Henrine Screws,   I saw your patient, Kara Leach, upon your kind request in my neurologic clinic today for initial consultation of her tremors.  The patient is unaccompanied today.  As you know, Ms. Kara Leach is a 71 year old right-handed woman with an underlying medical history of hypertension, chronic kidney disease, hyperlipidemia, gout, reflux disease, vitamin B12 deficiency, allergies, anxiety, depression, peripheral vascular disease, hearing loss, history of seizure, obstructive sleep apnea, obesity, and arthritis, who reports a several year history of bilateral hand tremors.  Her tremor has become worse over time.  Sometimes she feels that her upper body shakes.  She has had balance issues and has fallen, sustained injuries to the left knee and also to the left wrist before.  She recalls that her paternal grandmother had tremors and was originally diagnosed with a familial tremor and later in life she was diagnosed with Parkinson's disease.  Her father also had a hand tremor.  The patient is the oldest of 6 girls, none of her younger sisters have any tremors that she recalls.  She has 2 sons, ages 64 and 1, neither 1 with tremors.  I reviewed your office note from 02/27/2021.  Of note, she is on metoprolol for her blood pressure, also enalapril.  She had blood work through your office on 02/27/2021 and I reviewed the results: Uric acid was in the normal range at 4.5, CBC with differential and platelets benign, CMP showed blood sugar of 126, creatinine 1.25, BUN 18, alk phos 114, which is within range, AST 27, ALT 20, lipid panel benign with a total cholesterol of 127, triglycerides 136, HDL 45, LDL 58, TSH normal at 0.699, vitamin D 47.7, vitamin B12 660, magnesium 1.8, A1c 6.5. She hydrates well with  water.  She is followed by nephrology and just recently started [place] about 2 weeks ago.  Of note, she has been on metoprolol for years and also sertraline for years, she started an antidepressant after she lost her husband in 2005.  She is a non-smoker and drinks alcohol rarely, on special occasion.  She drinks caffeine in the form of hot tea, about 2 cups in the morning and sweetened iced tea with Splenda about 3 glasses/day. She does not sleep very well.  She has not used her CPAP regularly, she reports difficulty with her mask.  She has not made a follow-up appointment with her sleep specialist.  She reports that she was seen by someone in [place] for this.  Her Past Medical History Is Significant For: Past Medical History:  Diagnosis Date   Allergy    Anxiety    Arthritis    Osteoarthritis   Cancer ([place]) 05/2018   possibly on left breast but not sure   Chronic kidney disease    Depression    Diabetes mellitus without complication ([place]) 9629   Diabetic neuropathy ([place])    feet   Diverticulitis 2017   GERD (gastroesophageal reflux disease)    Headache    sinus and migraines on occasion   Hyperlipidemia    Hypertension    Last menstrual period (LMP) > 10 days ago 1986   Obesity    Peripheral vascular disease ([facility])    neuropathy in hands and feet d/t diabetes   Renal insufficiency    Stage  2 kidney disease   Seizures ([place])    due to high blood sugars. last time = 09/2014   Sleep apnea    has CPAP, can't tolerate   Stroke ([place]) 1980   minor. left side remains weaker than right   Wears hearing aid    right side   Wrist weakness    left - after fracture and repair    Her Past Surgical History Is Significant For: Past Surgical History:  Procedure Laterality Date   ABDOMINAL HYSTERECTOMY  1986   APPENDECTOMY  1964   BREAST BIOPSY Left 07/03/2016   benign, fibrocystic changes with calcifications, usual ductal hyperplasia.    BREAST BIOPSY Left 04/23/2018   radial scar    BREAST BIOPSY Left 08/13/2018   Procedure: WIDE LOCALIZATION AND OPEN BIOPSY OF LEFT BREAST, DIABETIC;  Surgeon: Robert Bellow, MD;  Location: [facility];  Service: General;  Laterality: Left;   BREAST EXCISIONAL BIOPSY Left 08/13/2018   CHOLECYSTECTOMY  2012   COLONOSCOPY WITH PROPOFOL N/A 12/01/2015   Procedure: COLONOSCOPY WITH PROPOFOL;  Surgeon: [name], MD;  Location: [facility];  Service: Endoscopy;  Laterality: N/A;  Diabetic - insulin Sleep apnea   PATELLA FRACTURE SURGERY Left    1. had to recenter kneecap, 2 and 3 surgeries were arthroscopies   WRIST FRACTURE SURGERY Left 2005   Steel plate    Her Family History Is Significant For: Family History  Problem Relation Age of Onset   Diabetes Mother    Heart disease Mother    Hypertension Mother    Cancer Father        stomach   Diabetes Sister    Arthritis Sister    Stroke Sister    Hypertension Sister    Breast cancer Maternal Aunt 26       great aunt   Parkinson's disease Maternal Grandmother     Her Social History Is Significant For: Social History   Socioeconomic History   Marital status: Widowed    Spouse name: Not on file   Number of children: Not on file   Years of education: Not on file   Highest education level: Not on file  Occupational History   Occupation: worked at [employer] for 41 years    Comment: retired  Tobacco Use   Smoking status: Never   Smokeless tobacco: Never  Scientific laboratory technician Use: Never used  Substance and Sexual Activity   Alcohol use: Not Currently    Alcohol/week: 0.0 standard drinks    Comment: occasionally holiday, weddings   Drug use: No   Sexual activity: Never  Other Topics Concern   Not on file  Social History Narrative   Not on file   Social Determinants of Health   Financial Resource Strain: Not on file  Food Insecurity: Not on file  Transportation Needs: Not on file  Physical Activity: Not on file  Stress: Not on file  Social Connections: Not  on file    Her Allergies Are:  Allergies  Allergen Reactions   Strawberry Extract Swelling    Lips swell and rash/whelps over body Difficulty breathing   Ibuprofen Other (See Comments)    "ringing in ears", dizziness Also, Advil / Aleve / Motrin   Cat Hair Extract Other (See Comments)    Sneezing  :   Her Current Medications Are:  Outpatient Encounter Medications as of 04/04/2021  Medication Sig   acetaminophen (TYLENOL) 650 MG CR tablet Take 650 mg by mouth every  8 (eight) hours as needed for pain.   allopurinol (ZYLOPRIM) 100 MG tablet Take 1 tablet (100 mg total) by mouth daily.   aspirin EC 81 MG tablet Take 81 mg by mouth daily.   BD PEN NEEDLE NANO U/F 32G X 4 MM MISC USE EVERY MORNING   Blood Glucose Monitoring Suppl (ONETOUCH VERIO REFLECT) w/Device KIT USE TO CHECK BLOOD SUGAR DAILY   Calcium Carb-Cholecalciferol (CALCIUM 600 + D PO) Take 1 tablet by mouth 2 (two) times daily. 1000 IU VITAMIN D   enalapril (VASOTEC) 2.5 MG tablet Take 1 tablet (2.5 mg total) by mouth daily.   Famotidine (PEPCID PO) Take 10 mg by mouth 2 (two) times daily.    fluticasone (FLONASE) 50 MCG/ACT nasal spray PLACE 2 SPRAYS INTO BOTH NOSTRILS DAILY AS NEEDED FOR ALLERGIES.   insulin aspart (NOVOLOG FLEXPEN) 100 UNIT/ML FlexPen INJECT 12-14 UNITS UNDER THE SKIN 3 TIMES DAILY WITH MEALS   insulin detemir (LEVEMIR FLEXTOUCH) 100 UNIT/ML FlexPen INJECT 50 UNITS INTO THE SKIN DAILY AT 10 PM.   Lancets (ONETOUCH DELICA PLUS [id number]) MISC Use to check blood sugars 3 to 4 times a day.   liraglutide (VICTOZA) 18 MG/3ML SOPN ADMINISTER 1.2MG UNDER THE SKIN DAILY   metoprolol succinate (TOPROL-XL) 25 MG 24 hr tablet Take 1 tablet (25 mg total) by mouth daily.   ONETOUCH ULTRA test strip USE TO CHECK BLOOD SUGAR DAILY   sertraline (ZOLOFT) 100 MG tablet Take 1 tablet (100 mg total) by mouth every morning.   simvastatin (ZOCOR) 40 MG tablet TAKE 1 TABLET BY MOUTH EVERYDAY AT BEDTIME   Specialty Vitamins  Products (VITA-RX DIABETIC VITAMIN) CAPS Take 1 packet by mouth daily. Diabetic Vitamins - 6 or 7 in 1 pack   No facility-administered encounter medications on file as of 04/04/2021.  :   Review of Systems:  Out of a complete 14 point review of systems, all are reviewed and negative with the exception of these symptoms as listed below:  Review of Systems  Neurological:        Pt is here for tremors. Pt states tremors are in both hands,arms.pt states at times it feels like her whole body is shaking  Pt states at times both feet have tremors. Pt states that her grandmother had Parkinson's.     Objective:  Neurological Exam  Physical Exam Physical Examination:   Vitals:   04/04/21 1416  BP: 133/74  Pulse: 78    General Examination: The patient is a very pleasant 71 y.o. female in no acute distress. She appears well-developed and well-nourished and well groomed.   HEENT: Normocephalic, atraumatic, pupils are equal, round and reactive to light, extraocular tracking is good without limitation to gaze excursion or nystagmus noted. Hearing is impaired, she has bilateral hearing aids. Face is symmetric with normal facial animation. Speech is clear with no dysarthria noted. There is no hypophonia.  She has a slight and intermittent voice tremor.  No obvious lip tremor or jaw tremor but has a intermittent slight head tremor.  Neck is supple with full range of motion, no carotid bruits.  Airway examination reveals mild mouth dryness, adequate dental hygiene.  Small airway noted.  Tongue protrudes centrally and palate elevates symmetrically.    Chest: Clear to auscultation without wheezing, rhonchi or crackles noted.  Heart: S1+S2+0, regular and normal without murmurs, rubs or gallops noted.   Abdomen: Soft, non-tender and non-distended.  Extremities: There is no pitting edema in the distal lower extremities bilaterally.  Skin: Warm and dry without trophic changes noted.   Musculoskeletal:  exam reveals scar left forearm, scar left knee.   Neurologically:  Mental status: The patient is awake, alert and oriented in all 4 spheres. Her immediate and remote memory, attention, language skills and fund of knowledge are appropriate. There is no evidence of aphasia, agnosia, apraxia or anomia. Speech is clear with normal prosody and enunciation. Thought process is linear. Mood is normal and affect is normal.  Cranial nerves II - XII are as described above under HEENT exam.  Motor exam: Normal bulk, strength and tone is noted.  No cogwheeling noted.  No resting tremor.  She has a mild bilateral upper extremity postural and action tremor, no significant intention tremor, no lower extremity tremor.    On 04/04/2021: On Archimedes spiral drawing she has mild trembling with both hands, more noticeable with her left hand, handwriting with her dominant hand which is the right hand is legible, mildly tremulous, not micrographic.  Reflexes are 1+ in the upper extremities and diminished in the lower extremities.  Fine motor skills and coordination: Globally mildly impaired, no lateralization.  Cerebellar testing: No dysmetria or intention tremor. There is no truncal or gait ataxia.  Finger-to-nose without dysmetria. Sensory exam: intact to light touch in the upper and lower extremities.  Gait, station and balance: She stands without difficulty, posture is slightly stooped for age, she walks with mild insecurity, no shuffling.   Assessment and Plan:  In summary, DONETA BAYMAN is a very pleasant 71 y.o.-year old female with an underlying medical history of hypertension, chronic kidney disease, hyperlipidemia, gout, reflux disease, vitamin B12 deficiency, allergies, anxiety, depression, peripheral vascular disease, hearing loss, history of seizure, obstructive sleep apnea, obesity, and arthritis, who presents for evaluation of her bilateral hand tremors of several years duration.  She has been on a  beta-blocker for the past few years, she has noticed progression over time.  She has had some balance issues, has even fallen.  We talked about familial tremor today.  It is possible that she has a hereditary tremor.  No obvious signs of parkinsonism.  She is reassured in that regard.  We talked about tremor triggers today as well.  She has noticed that stress makes her tremor flareup quite a bit.  She has been on sertraline for years and is advised that certain antidepressant medications including SSRI type medications can sometimes exacerbate tremors.  This does not necessarily mean that she has to change her depression medication.  We talked about supportive treatments.  She is encouraged to continue to hydrate well.  She is advised that beta-blockers can help with symptomatic tremor control.  We also talked about caffeine and sleep deprivation.  She does not currently sleep well.  She is advised to follow-up with her sleep specialist and get back on track with her CPAP machine.  They may be able to adjust the pressure or change her mask.  We talked about symptomatic treatment with [place].  We have to be cautious with patients with kidney impairment but it can also cause balance issues.  Since she has fallen and has had some balance problems, I would not favor adding this medication at this time.  She is advised to try to limit her caffeine to 1 or 2 servings of caffeine containing beverages per day.  She drinks several cups of tea per day.  My hope is that with better sleep and reduction in her caffeine she will noticed some  improvement in her tremors.  I would like to reevaluate her in about 6 months, sooner if needed.  She had recent blood work through your office.  I did not suggest any new medications or blood work from my end of things.  I did not suggest a brain scan, neurological exam is largely nonfocal otherwise. I answered all her questions today and she was in agreement with our approach.  Thank  you very much for allowing me to participate in the care of this nice patient. If I can be of any further assistance to you please do not hesitate to call me at [phone].  Sincerely,   Star Age, MD, PhD

## 2021-04-12 ENCOUNTER — Telehealth: Payer: Self-pay | Admitting: Nurse Practitioner

## 2021-04-12 MED ORDER — LEVEMIR FLEXTOUCH 100 UNIT/ML ~~LOC~~ SOPN: PEN_INJECTOR | SUBCUTANEOUS | 4 refills | Status: DC

## 2021-04-12 NOTE — Telephone Encounter (Signed)
error 

## 2021-04-12 NOTE — Addendum Note (Signed)
Addended by: [name] T on: 04/12/2021 04:10 PM   Modules accepted: Orders

## 2021-04-12 NOTE — Telephone Encounter (Signed)
While on the phone scheduling AWV, patient requested a refill for Levemir. I advised patient that I would send a message to a clinical team member informing them of this request.  Pharmacy[facility] on [address]

## 2021-04-14 DIAGNOSIS — E119 Type 2 diabetes mellitus without complications: Secondary | ICD-10-CM | POA: Diagnosis not present

## 2021-04-14 LAB — HM DIABETES EYE EXAM

## 2021-04-20 ENCOUNTER — Ambulatory Visit: Payer: Medicare HMO

## 2021-04-25 ENCOUNTER — Telehealth: Payer: Self-pay | Admitting: Nurse Practitioner

## 2021-04-25 MED ORDER — INSULIN DETEMIR 100 UNIT/ML FLEXPEN: 50.0000 [IU] | PEN_INJECTOR | Freq: Every day | SUBCUTANEOUS | 11 refills | Status: DC

## 2021-04-25 NOTE — Telephone Encounter (Signed)
Pt is completely out of her insulin because her levemir is on back order, she called to request an alternative. Says she has already missed three doses thus far.  [id number] - [place], [place] - [address]  [address] [address]  Phone: [phone] Fax: [phone]

## 2021-05-02 ENCOUNTER — Ambulatory Visit
Admission: RE | Admit: 2021-05-02 | Discharge: 2021-05-02 | Disposition: A | Discharge status: Home / Self Care | Payer: Medicare HMO | Source: Ambulatory Visit | Attending: Nurse Practitioner | Admitting: Nurse Practitioner

## 2021-05-02 ENCOUNTER — Other Ambulatory Visit: Payer: Self-pay | Admitting: Nurse Practitioner

## 2021-05-02 ENCOUNTER — Other Ambulatory Visit: Payer: Self-pay

## 2021-05-02 DIAGNOSIS — Z1231 Encounter for screening mammogram for malignant neoplasm of breast: Secondary | ICD-10-CM | POA: Diagnosis not present

## 2021-05-02 DIAGNOSIS — R928 Other abnormal and inconclusive findings on diagnostic imaging of breast: Secondary | ICD-10-CM

## 2021-05-02 DIAGNOSIS — N6489 Other specified disorders of breast: Secondary | ICD-10-CM

## 2021-05-02 NOTE — Progress Notes (Signed)
Please let Kara Leach know that the breast center will be calling, as there as noted to have some asymmetry to left breast -- she will need additional imaging.  If any questions or concerns let us know.

## 2021-05-17 ENCOUNTER — Ambulatory Visit
Admission: RE | Admit: 2021-05-17 | Discharge: 2021-05-17 | Disposition: A | Discharge status: Home / Self Care | Payer: Medicare HMO | Source: Ambulatory Visit | Attending: Nurse Practitioner | Admitting: Nurse Practitioner

## 2021-05-17 ENCOUNTER — Other Ambulatory Visit: Payer: Self-pay

## 2021-05-17 DIAGNOSIS — R928 Other abnormal and inconclusive findings on diagnostic imaging of breast: Secondary | ICD-10-CM

## 2021-05-17 DIAGNOSIS — R922 Inconclusive mammogram: Secondary | ICD-10-CM | POA: Diagnosis not present

## 2021-05-17 DIAGNOSIS — N6489 Other specified disorders of breast: Secondary | ICD-10-CM | POA: Insufficient documentation

## 2021-05-18 ENCOUNTER — Other Ambulatory Visit: Payer: Self-pay | Admitting: Nurse Practitioner

## 2021-05-18 DIAGNOSIS — R928 Other abnormal and inconclusive findings on diagnostic imaging of breast: Secondary | ICD-10-CM

## 2021-05-18 DIAGNOSIS — N63 Unspecified lump in unspecified breast: Secondary | ICD-10-CM

## 2021-05-27 NOTE — Patient Instructions (Signed)

## 2021-05-29 ENCOUNTER — Ambulatory Visit (INDEPENDENT_AMBULATORY_CARE_PROVIDER_SITE_OTHER): Payer: Medicare HMO | Admitting: Nurse Practitioner

## 2021-05-29 ENCOUNTER — Encounter: Payer: Self-pay | Admitting: Nurse Practitioner

## 2021-05-29 ENCOUNTER — Other Ambulatory Visit: Payer: Self-pay

## 2021-05-29 VITALS — BP 136/79 | HR 74 | Temp 98.2°F | Wt 158.8 lb

## 2021-05-29 DIAGNOSIS — E1122 Type 2 diabetes mellitus with diabetic chronic kidney disease: Secondary | ICD-10-CM

## 2021-05-29 DIAGNOSIS — N183 Chronic kidney disease, stage 3 unspecified: Secondary | ICD-10-CM

## 2021-05-29 DIAGNOSIS — R251 Tremor, unspecified: Secondary | ICD-10-CM

## 2021-05-29 DIAGNOSIS — Z23 Encounter for immunization: Secondary | ICD-10-CM

## 2021-05-29 DIAGNOSIS — D472 Monoclonal gammopathy: Secondary | ICD-10-CM | POA: Diagnosis not present

## 2021-05-29 DIAGNOSIS — N6323 Unspecified lump in the left breast, lower outer quadrant: Secondary | ICD-10-CM | POA: Diagnosis not present

## 2021-05-29 DIAGNOSIS — N1832 Chronic kidney disease, stage 3b: Secondary | ICD-10-CM | POA: Diagnosis not present

## 2021-05-29 DIAGNOSIS — Z794 Long term (current) use of insulin: Secondary | ICD-10-CM | POA: Diagnosis not present

## 2021-05-29 DIAGNOSIS — I152 Hypertension secondary to endocrine disorders: Secondary | ICD-10-CM | POA: Diagnosis not present

## 2021-05-29 DIAGNOSIS — R69 Illness, unspecified: Secondary | ICD-10-CM | POA: Diagnosis not present

## 2021-05-29 DIAGNOSIS — E1159 Type 2 diabetes mellitus with other circulatory complications: Secondary | ICD-10-CM

## 2021-05-29 DIAGNOSIS — F324 Major depressive disorder, single episode, in partial remission: Secondary | ICD-10-CM

## 2021-05-29 DIAGNOSIS — E1169 Type 2 diabetes mellitus with other specified complication: Secondary | ICD-10-CM

## 2021-05-29 DIAGNOSIS — E785 Hyperlipidemia, unspecified: Secondary | ICD-10-CM

## 2021-05-29 LAB — BAYER DCA HB A1C WAIVED: HB A1C (BAYER DCA - WAIVED): 6.7 % — ABNORMAL HIGH (ref 4.8–5.6)

## 2021-05-29 LAB — MICROALBUMIN, URINE WAIVED
Creatinine, Urine Waived: 200 mg/dL (ref 10–300)
Microalb, Ur Waived: 10 mg/L (ref 0–19)
Microalb/Creat Ratio: <30 mg/g (ref <30)

## 2021-05-29 MED ORDER — ONETOUCH ULTRA VI STRP: ORAL_STRIP | 12 refills | Status: DC

## 2021-05-29 NOTE — Assessment & Plan Note (Signed)
Chronic, ongoing.  Continue current medication regimen and adjust as needed.  Lipid panel today, recent LDL < 70. 

## 2021-05-29 NOTE — Progress Notes (Signed)
BP 136/79    Pulse 74    Temp 98.2 F (36.8 C) (Oral)    Wt 158 lb 12.8 oz (72 kg)    LMP  (LMP Unknown)    SpO2 97%    BMI 32.07 kg/m    Subjective:    Patient ID: Kara Leach, female    DOB: 04-24-1950, 71 y.o.   MRN: [id number]  HPI: Kara Leach is a 71 y.o. female  Chief Complaint  Patient presents with   Diabetes   Hyperlipidemia   Hypertension   Tremors   Mood   TREMORS: Seen by neurology for initial visit on 04/04/21, no changes made.  Does have noted tremor upper body, has in family too.  Also some gait issues.  Is to return to neurology in June.  She was recommended to start using her cane more.    DIABETES Last A1c 6.5% December.  Continues on Novolog pre meal and Levemir + Victoza 1.2 MG daily & Farxiga 10 MG daily (this was recently started by nephrology).   Hypoglycemic episodes:no Polydipsia/polyuria: no Visual disturbance: no Chest pain: no Paresthesias: no Glucose Monitoring: yes  Accucheck frequency: BID  Fasting glucose: 112 this morning -- averages 110-120  Post prandial:  Evening: average 130-150  Before meals: Taking Insulin?: yes  Long acting insulin: Levemir 50 units  Short acting insulin: Novolog 14 units in evening and 12 in morning and lunch Blood Pressure Monitoring: daily Retinal Examination: Up To Date Foot Exam: Up to Date Pneumovax: Up to Date Influenza: Up to Date Aspirin: yes   HYPERTENSION / HYPERLIPIDEMIA Continues on Enalapril, Metoprolol, and ASA + Simvastatin.   Satisfied with current treatment? yes Duration of hypertension: chronic BP monitoring frequency: daily BP range: 120-130/70 range at home BP medication side effects: no Duration of hyperlipidemia: chronic Cholesterol medication side effects: no Cholesterol supplements: none Medication compliance: good compliance Aspirin: yes Recent stressors: no Recurrent headaches: no Visual changes: no Palpitations: no Dyspnea: no Chest pain: no Lower extremity  edema: no Dizzy/lightheaded:occasional with position change  CHRONIC KIDNEY DISEASE With GFR ranging from 44-56 over past three years.  Last saw nephrology on 03/20/21 and labs with CRT 1.12 and GFR 54, PTH 54.  Started her on Farxiga. CKD status: stable Medications renally dose: yes Previous renal evaluation: yes Pneumovax:  Up to Date Influenza Vaccine:  Up to Date   MGUS: Last saw oncology on 01/26/21 with numbers remaining stable. Sees Dr. Grayland Ormond every 6 months.  Saw Dr. Bary Castilla on 05/05/20 for follow-up on breasts -- recent mammogram 05/17/21, is to have another left breast biopsy, which is scheduling.  DEPRESSION Continues on Sertraline 100 MG daily.  Recently lost one of her cats. Mood status: stable Satisfied with current treatment?: yes Symptom severity: mild  Duration of current treatment : chronic Side effects: no Medication compliance: good compliance Psychotherapy/counseling: none Depressed mood: occasional Anxious mood: occasional Anhedonia: no Significant weight loss or gain: no Insomnia: occasional, hard to stay asleep Fatigue: no Feelings of worthlessness or guilt: no Impaired concentration/indecisiveness: no Suicidal ideations: no Hopelessness: no Crying spells: no Depression screen [facility] 2/9 05/29/2021 02/27/2021 11/23/2020 08/25/2020 05/25/2020  Decreased Interest 1 1 0 0 0  Down, Depressed, Hopeless '1 2 1 1 3  '$ PHQ - 2 Score '2 3 1 1 3  '$ Altered sleeping '2 2 1 3 3  '$ Tired, decreased energy '2 1 1 3 3  '$ Change in appetite 0 0 0 0 0  Feeling bad or failure about yourself  2 2 0 0 1  Trouble concentrating 0 0 0 0 0  Moving slowly or fidgety/restless 1 0 0 0 0  Suicidal thoughts 0 0 0 0 0  PHQ-9 Score '9 8 3 7 10  '$ Difficult doing work/chores - Somewhat difficult Not difficult at all Somewhat difficult -  Some recent data might be hidden    Relevant past medical, surgical, family and social history reviewed and updated as indicated. Interim medical history since our  last visit reviewed. Allergies and medications reviewed and updated.  Review of Systems  Constitutional:  Negative for activity change, appetite change, diaphoresis, fatigue and fever.  Respiratory:  Negative for cough, chest tightness and shortness of breath.   Cardiovascular:  Negative for chest pain, palpitations and leg swelling.  Gastrointestinal: Negative.   Endocrine: Negative for cold intolerance, heat intolerance, polydipsia, polyphagia and polyuria.  Neurological: Negative.   Psychiatric/Behavioral: Negative.     Per HPI unless specifically indicated above     Objective:    BP 136/79    Pulse 74    Temp 98.2 F (36.8 C) (Oral)    Wt 158 lb 12.8 oz (72 kg)    LMP  (LMP Unknown)    SpO2 97%    BMI 32.07 kg/m   Wt Readings from Last 3 Encounters:  05/29/21 158 lb 12.8 oz (72 kg)  04/04/21 158 lb (71.7 kg)  02/27/21 154 lb 12.8 oz (70.2 kg)    Physical Exam Vitals and nursing note reviewed.  Constitutional:      General: She is awake. She is not in acute distress.    Appearance: She is well-developed and well-groomed. She is obese. She is not ill-appearing.  HENT:     Head: Normocephalic.     Right Ear: Hearing normal.     Left Ear: Hearing normal.  Eyes:     General: Lids are normal.        Right eye: No discharge.        Left eye: No discharge.     Conjunctiva/sclera: Conjunctivae normal.     Pupils: Pupils are equal, round, and reactive to light.  Neck:     Thyroid: No thyromegaly.     Vascular: No carotid bruit.  Cardiovascular:     Rate and Rhythm: Normal rate and regular rhythm.     Heart sounds: Normal heart sounds. No murmur heard.   No gallop.     Comments: Varicose veins to bilateral lower legs.   Pulmonary:     Effort: Pulmonary effort is normal. No accessory muscle usage or respiratory distress.     Breath sounds: Normal breath sounds.  Abdominal:     General: Bowel sounds are normal.     Palpations: Abdomen is soft.     Tenderness: There is no  abdominal tenderness.  Musculoskeletal:     Cervical back: Normal range of motion and neck supple.     Right lower leg: No edema.     Left lower leg: No edema.  Lymphadenopathy:     Cervical: No cervical adenopathy.  Skin:    General: Skin is warm and dry.  Neurological:     Mental Status: She is alert and oriented to person, place, and time.  Psychiatric:        Attention and Perception: Attention normal.        Mood and Affect: Mood normal.        Speech: Speech normal.        Behavior: Behavior normal.  Behavior is cooperative.        Thought Content: Thought content normal.   Results for orders placed or performed in visit on 05/29/21  Bayer DCA Hb A1c Waived  Result Value Ref Range   HB A1C (BAYER DCA - WAIVED) 6.7 (H) 4.8 - 5.6 %  Microalbumin, Urine Waived  Result Value Ref Range   Microalb, Ur Waived 10 0 - 19 mg/L   Creatinine, Urine Waived 200 10 - 300 mg/dL   Microalb/Creat Ratio <30 <30 mg/g      Assessment & Plan:   Problem List Items Addressed This Visit       Cardiovascular and Mediastinum   Hypertension associated with diabetes ([place]) (Chronic)    Chronic, ongoing with CKD 3.  BP at goal home and office.  At this time will continue current medication regimen and adjust as needed.  Alert provider if BP consistently >130/90 at home and then may restart Amlodipine at 2.5 MG.  Continue Metoprolol and Enalapril and adjust as needed.  Focus on DASH diet.  Enalapril for kidney protection.  TSH and urine ALB today.  Return in 3 months.      Relevant Medications   dapagliflozin propanediol (FARXIGA) 10 MG TABS tablet   Other Relevant Orders   Bayer DCA Hb A1c Waived (Completed)   Microalbumin, Urine Waived (Completed)   TSH     Endocrine   CKD stage 3 due to type 2 diabetes mellitus (HCC) (Chronic)    Chronic, ongoing.  A1C 6.7% today.  Continue Enalapril for kidney protection and continue to renal dose medications as needed.  Continue to collaborate with  nephrology -- they recently added on Farxiga, have instructed on how and when to cut back on insulin if sugars drop lower at home with addition of [place].      Relevant Medications   dapagliflozin propanediol (FARXIGA) 10 MG TABS tablet   Other Relevant Orders   Bayer DCA Hb A1c Waived (Completed)   Microalbumin, Urine Waived (Completed)   Hyperlipidemia associated with type 2 diabetes mellitus (HCC) (Chronic)    Chronic, ongoing.  Continue current medication regimen and adjust as needed.  Lipid panel today, recent LDL <70.      Relevant Medications   dapagliflozin propanediol (FARXIGA) 10 MG TABS tablet   Other Relevant Orders   Bayer DCA Hb A1c Waived (Completed)   Lipid Panel w/o Chol/HDL Ratio   Type 2 diabetes mellitus with stage 3b chronic kidney disease, with long-term current use of insulin (HCC) - Primary (Chronic)    Chronic, ongoing with A1C 6.7% today and urine ALB 10 (March 2023).  At this time continue current medication regimen, but nephrology recently added on Farxiga, have instructed her on how and when to cut back on insulin if sugars drop lower at home with addition of Farxiga Continue to check BS at home daily and document + continue collaboration with nephrology.  Return in 3 months.      Relevant Medications   dapagliflozin propanediol (FARXIGA) 10 MG TABS tablet   Other Relevant Orders   Bayer DCA Hb A1c Waived (Completed)   Microalbumin, Urine Waived (Completed)     Other   MGUS (monoclonal gammopathy of unknown significance) (Chronic)    Continue collaboration with Dr. Grayland Ormond at [facility].      Depression, major, single episode, in partial remission (HCC)    Chronic, stable.  Denies SI/HI.  Continue current medication regimen, Sertraline 100 MG daily, and adjust as needed. Recommend meditation  and relaxation techniques at home.      Mass of lower outer quadrant of left breast    Continue to collaborate and review records from Dr. Bary Castilla.       Tremor of both hands    At this time will continue collaboration with neurology, recent notes reviewed.      Other Visit Diagnoses     Need for Td vaccine       Td vaccine today   Relevant Orders   Td vaccine greater than or equal to 69yo preservative free IM        Follow up plan: Return in about 3 months (around 08/29/2021) for T2DM, HTN/HLD, MOOD, GERD.

## 2021-05-29 NOTE — Assessment & Plan Note (Signed)
At this time will continue collaboration with neurology, recent notes reviewed. ?

## 2021-05-29 NOTE — Assessment & Plan Note (Signed)
Continue to collaborate and review records from Dr. Bary Castilla. ?

## 2021-05-29 NOTE — Assessment & Plan Note (Signed)
Continue collaboration with Dr. Grayland Ormond at [facility] ?

## 2021-05-29 NOTE — Assessment & Plan Note (Signed)
Chronic, ongoing with A1C 6.7% today and urine ALB 10 (March 2023).  At this time continue current medication regimen, but nephrology recently added on Farxiga, have instructed her on how and when to cut back on insulin if sugars drop lower at home with addition of Farxiga Continue to check BS at home daily and document + continue collaboration with nephrology.  Return in 3 months. ?

## 2021-05-29 NOTE — Assessment & Plan Note (Signed)
Chronic, ongoing with CKD 3.  BP at goal home and office.  At this time will continue current medication regimen and adjust as needed.  Alert provider if BP consistently >130/90 at home and then may restart Amlodipine at 2.5 MG.  Continue Metoprolol and Enalapril and adjust as needed.  Focus on DASH diet.  Enalapril for kidney protection.  TSH and urine ALB today.  Return in 3 months. ?

## 2021-05-29 NOTE — Assessment & Plan Note (Signed)
Chronic, ongoing.  A1C 6.7% today.  Continue Enalapril for kidney protection and continue to renal dose medications as needed.  Continue to collaborate with nephrology -- they recently added on Farxiga, have instructed on how and when to cut back on insulin if sugars drop lower at home with addition of [place]. ?

## 2021-05-29 NOTE — Assessment & Plan Note (Signed)
Chronic, stable.  Denies SI/HI.  Continue current medication regimen, Sertraline 100 MG daily, and adjust as needed. Recommend meditation and relaxation techniques at home. 

## 2021-05-30 LAB — LIPID PANEL W/O CHOL/HDL RATIO
Cholesterol, Total: 159 mg/dL (ref 100–199)
HDL: 41 mg/dL (ref >39)
LDL Chol Calc (NIH): 74 mg/dL (ref 0–99)
Triglycerides: 272 mg/dL — ABNORMAL HIGH (ref 0–149)
VLDL Cholesterol Cal: 44 mg/dL — ABNORMAL HIGH (ref 5–40)

## 2021-05-30 LAB — TSH: TSH: 1.4 u[IU]/mL (ref 0.450–4.500)

## 2021-06-09 ENCOUNTER — Ambulatory Visit
Admission: RE | Admit: 2021-06-09 | Discharge: 2021-06-09 | Disposition: A | Discharge status: Home / Self Care | Payer: Medicare HMO | Source: Ambulatory Visit | Attending: Nurse Practitioner | Admitting: Nurse Practitioner

## 2021-06-09 ENCOUNTER — Other Ambulatory Visit: Payer: Self-pay

## 2021-06-09 DIAGNOSIS — N641 Fat necrosis of breast: Secondary | ICD-10-CM | POA: Diagnosis not present

## 2021-06-09 DIAGNOSIS — R928 Other abnormal and inconclusive findings on diagnostic imaging of breast: Secondary | ICD-10-CM

## 2021-06-09 DIAGNOSIS — N63 Unspecified lump in unspecified breast: Secondary | ICD-10-CM

## 2021-06-09 DIAGNOSIS — N6325 Unspecified lump in the left breast, overlapping quadrants: Secondary | ICD-10-CM | POA: Diagnosis not present

## 2021-06-12 LAB — SURGICAL PATHOLOGY

## 2021-07-20 DIAGNOSIS — I10 Essential (primary) hypertension: Secondary | ICD-10-CM | POA: Diagnosis not present

## 2021-07-20 DIAGNOSIS — E1122 Type 2 diabetes mellitus with diabetic chronic kidney disease: Secondary | ICD-10-CM | POA: Diagnosis not present

## 2021-07-20 DIAGNOSIS — N1832 Chronic kidney disease, stage 3b: Secondary | ICD-10-CM | POA: Diagnosis not present

## 2021-07-20 DIAGNOSIS — N1831 Chronic kidney disease, stage 3a: Secondary | ICD-10-CM | POA: Diagnosis not present

## 2021-07-26 ENCOUNTER — Other Ambulatory Visit: Payer: Self-pay

## 2021-07-26 DIAGNOSIS — E1122 Type 2 diabetes mellitus with diabetic chronic kidney disease: Secondary | ICD-10-CM

## 2021-07-26 MED ORDER — BD PEN NEEDLE SHORT U/F 31G X 8 MM MISC: 1.0000 | Freq: Three times a day (TID) | 12 refills | Status: DC

## 2021-08-01 ENCOUNTER — Encounter: Payer: Self-pay | Admitting: Nurse Practitioner

## 2021-08-26 ENCOUNTER — Other Ambulatory Visit: Payer: Self-pay | Admitting: Nurse Practitioner

## 2021-08-28 NOTE — Telephone Encounter (Signed)
Requested Prescriptions  Pending Prescriptions Disp Refills  . NOVOLOG FLEXPEN 100 UNIT/ML FlexPen [Pharmacy Med Name: NOVOLOG 100 UNIT/ML FLEXPEN] 15 mL 4    Sig: INJECT 12-14 UNITS UNDER THE SKIN 3 TIMES DAILY WITH MEALS     Endocrinology:  Diabetes - Insulins Passed - 08/26/2021 10:58 AM      Passed - HBA1C is between 0 and 7.9 and within 180 days    Hemoglobin A1C  Date Value Ref Range Status  11/12/2013 13.4 (H) 4.2 - 6.3 % Final    Comment:    The American Diabetes Association recommends that a primary goal of therapy should be <7% and that physicians should reevaluate the treatment regimen in patients with HbA1c values consistently >8%.    HB A1C (BAYER DCA - WAIVED)  Date Value Ref Range Status  05/29/2021 6.7 (H) 4.8 - 5.6 % Final    Comment:             Prediabetes: 5.7 - 6.4          Diabetes: >6.4          Glycemic control for adults with diabetes: <7.0          Passed - Valid encounter within last 6 months    Recent Outpatient Visits          3 months ago Type 2 diabetes mellitus with stage 3b chronic kidney disease, with long-term current use of insulin ([place])   [place], Jolene T, NP   6 months ago Type 2 diabetes mellitus with stage 3b chronic kidney disease, with long-term current use of insulin ([place])   [place], Jolene T, NP   9 months ago Type 2 diabetes mellitus with stage 3b chronic kidney disease, with long-term current use of insulin ([place])   [place], [place] T, NP   1 year ago Type 2 diabetes mellitus with stage 3b chronic kidney disease, with long-term current use of insulin ([place])   [place], Jolene T, NP   1 year ago Type 2 diabetes mellitus with stage 3b chronic kidney disease, with long-term current use of insulin ([place])   [place], Barbaraann Faster, NP      Future Appointments            In 2 weeks [place], Barbaraann Faster, NP [employer]

## 2021-09-04 ENCOUNTER — Telehealth: Payer: Self-pay | Admitting: Nurse Practitioner

## 2021-09-04 NOTE — Telephone Encounter (Signed)
Copied from [place] [id number]. Topic: Medicare AWV >> Sep 04, 2021 10:23 AM Josephina Gip wrote: Reason for CRM: Left message for patient to call back and schedule the Medicare Annual Wellness Visit (AWV) by telephone.  Last AWV 02/15/20  Please schedule at anytime with [facility]Nurse Health Advisor.   30 minute appointment  Any questions, please call me at [phone]

## 2021-09-05 ENCOUNTER — Ambulatory Visit: Payer: Medicare HMO | Admitting: Nurse Practitioner

## 2021-09-12 ENCOUNTER — Ambulatory Visit (INDEPENDENT_AMBULATORY_CARE_PROVIDER_SITE_OTHER): Payer: Medicare HMO | Admitting: Nurse Practitioner

## 2021-09-12 ENCOUNTER — Encounter: Payer: Self-pay | Admitting: Nurse Practitioner

## 2021-09-12 VITALS — BP 118/78 | HR 68 | Temp 98.2°F | Ht 59.0 in | Wt 154.6 lb

## 2021-09-12 DIAGNOSIS — E785 Hyperlipidemia, unspecified: Secondary | ICD-10-CM | POA: Diagnosis not present

## 2021-09-12 DIAGNOSIS — E1159 Type 2 diabetes mellitus with other circulatory complications: Secondary | ICD-10-CM

## 2021-09-12 DIAGNOSIS — R251 Tremor, unspecified: Secondary | ICD-10-CM | POA: Diagnosis not present

## 2021-09-12 DIAGNOSIS — I83819 Varicose veins of unspecified lower extremities with pain: Secondary | ICD-10-CM | POA: Insufficient documentation

## 2021-09-12 DIAGNOSIS — E1169 Type 2 diabetes mellitus with other specified complication: Secondary | ICD-10-CM

## 2021-09-12 DIAGNOSIS — E6609 Other obesity due to excess calories: Secondary | ICD-10-CM | POA: Diagnosis not present

## 2021-09-12 DIAGNOSIS — N1832 Chronic kidney disease, stage 3b: Secondary | ICD-10-CM

## 2021-09-12 DIAGNOSIS — Z794 Long term (current) use of insulin: Secondary | ICD-10-CM

## 2021-09-12 DIAGNOSIS — D472 Monoclonal gammopathy: Secondary | ICD-10-CM

## 2021-09-12 DIAGNOSIS — F324 Major depressive disorder, single episode, in partial remission: Secondary | ICD-10-CM

## 2021-09-12 DIAGNOSIS — R69 Illness, unspecified: Secondary | ICD-10-CM | POA: Diagnosis not present

## 2021-09-12 DIAGNOSIS — I152 Hypertension secondary to endocrine disorders: Secondary | ICD-10-CM

## 2021-09-12 DIAGNOSIS — E1122 Type 2 diabetes mellitus with diabetic chronic kidney disease: Secondary | ICD-10-CM

## 2021-09-12 DIAGNOSIS — Z6831 Body mass index (BMI) 31.0-31.9, adult: Secondary | ICD-10-CM

## 2021-09-12 DIAGNOSIS — N183 Chronic kidney disease, stage 3 unspecified: Secondary | ICD-10-CM | POA: Diagnosis not present

## 2021-09-12 LAB — BAYER DCA HB A1C WAIVED: HB A1C (BAYER DCA - WAIVED): 7.2 % — ABNORMAL HIGH (ref 4.8–5.6)

## 2021-09-12 NOTE — Progress Notes (Signed)
BP 118/78 (BP Location: Left Arm, Patient Position: Sitting, Cuff Size: Normal)   Pulse 68   Temp 98.2 F (36.8 C) (Oral)   Ht 4\' 11"  (1.499 m)   Wt 154 lb 9.6 oz (70.1 kg)   LMP  (LMP Unknown)   SpO2 97%   BMI 31.23 kg/m    Subjective:    Patient ID: Kara Leach, female    DOB: 1950/05/19, 71 y.o.   MRN: [id number]  HPI: Kara Leach is a 71 y.o. female  Chief Complaint  Patient presents with   Diabetes   Hyperlipidemia   Hypertension   Mood   Varicose Veins    Patient says she fell recently, tripped over the cat and she has been having issues with her varicose veins in her R legs. Patient would like to have the provider take a look at them at today's visit.    DIABETES Last A1c 6.7% in March.  Continues on Novolog pre meal and Levemir + Victoza 1.2 MG daily & Farxiga 10 MG daily (started by nephrology months back).  Has gone to some recent birthdays and parties, indulging.   Hypoglycemic episodes:no Polydipsia/polyuria: no Visual disturbance: no Chest pain: no Paresthesias: no Glucose Monitoring: yes  Accucheck frequency: BID  Fasting glucose: 118 this morning -- averages 110-125  Post prandial:  Evening: average 150-160  Before meals: Taking Insulin?: yes  Long acting insulin: Levemir 44 units  Short acting insulin: Novolog 12 units in evening and 12 in morning and lunch Blood Pressure Monitoring: daily Retinal Examination: Up To Date Foot Exam: Up to Date Pneumovax: Up to Date Influenza: Up to Date Aspirin: yes   HYPERTENSION / HYPERLIPIDEMIA/TREMORS Continues on Enalapril, Metoprolol, and ASA + Simvastatin.    She is following with neurology for tremor to hands, last visit 04/04/21 -- they recommended she use cane more.  Has follow-up upcoming.  She recently had a fall one week ago after tripping on cat.  Has bruising that is healing to right lower leg -- varicosities to right lower leg that are beginning to cause her pain.  Reports veins are  starting to bulge out more.  Wears compression hose in winter at home.  Does take Tylenol at home for pain with varicose veins, sometimes has to take two of these to help with pain. Stood on feet a lot at [employer] for years. Satisfied with current treatment? yes Duration of hypertension: chronic BP monitoring frequency: daily BP range: 130/80 range at home BP medication side effects: no Duration of hyperlipidemia: chronic Cholesterol medication side effects: no Cholesterol supplements: none Medication compliance: good compliance Aspirin: yes Recent stressors: no Recurrent headaches: no Visual changes: no Palpitations: no Dyspnea: no Chest pain: no Lower extremity edema: occasional with varicose veins Dizzy/lightheaded: occasional, with standing up too quickly  CHRONIC KIDNEY DISEASE With GFR ranging from 44-56 over past three years.  Last saw nephrology on 07/20/21 and labs with CRT 1.25 and GFR 47, PTH 55.   CKD status: stable Medications renally dose: yes Previous renal evaluation: yes Pneumovax:  Up to Date Influenza Vaccine:  Up to Date   MGUS: Last saw oncology on 01/26/21 with numbers stable. Sees Dr. Orlie Dakin every 6 months -- returns in October.  Saw Dr. Lemar Livings on 05/05/20 for follow-up on breasts -- last mammogram 05/17/21, had repeat breast biopsy on 06/09/21 which was stable.  DEPRESSION Continues on Sertraline 100 MG daily.  Has some trigger moments coming up, her 52nd wedding anniversary is near --  husband passed in 2005. Mood status: stable Satisfied with current treatment?: yes Symptom severity: mild  Duration of current treatment : chronic Side effects: no Medication compliance: good compliance Psychotherapy/counseling: no Depressed mood: occasional Anxious mood: occasional Anhedonia: no Significant weight loss or gain: no Insomnia: occasional, hard to stay asleep Fatigue: no Feelings of worthlessness or guilt: no Impaired concentration/indecisiveness:  no Suicidal ideations: no Hopelessness: no Crying spells: no    09/12/2021    9:54 AM 05/29/2021    9:48 AM 02/27/2021   10:10 AM 11/23/2020    8:49 AM 08/25/2020    9:03 AM  Depression screen PHQ 2/9  Decreased Interest 1 1 1  0 0  Down, Depressed, Hopeless 2 1 2 1 1   PHQ - 2 Score 3 2 3 1 1   Altered sleeping 3 2 2 1 3   Tired, decreased energy  2 1 1 3   Change in appetite 0 0 0 0 0  Feeling bad or failure about yourself  1 2 2  0 0  Trouble concentrating 0 0 0 0 0  Moving slowly or fidgety/restless 0 1 0 0 0  Suicidal thoughts 0 0 0 0 0  PHQ-9 Score 7 9 8 3 7   Difficult doing work/chores Somewhat difficult  Somewhat difficult Not difficult at all Somewhat difficult      09/12/2021    9:55 AM 05/29/2021    9:49 AM 02/27/2021   10:13 AM 02/11/2019    8:26 AM  GAD 7 : Generalized Anxiety Score  Nervous, Anxious, on Edge 0 2 0 3  Control/stop worrying 1 2 0 3  Worry too much - different things 1 2 0 3  Trouble relaxing 1 2 0 3  Restless 0 0 0 2  Easily annoyed or irritable 0 0 0 2  Afraid - awful might happen 1 1 0 2  Total GAD 7 Score 4 9 0 18  Anxiety Difficulty Somewhat difficult Somewhat difficult Not difficult at all Somewhat difficult      Relevant past medical, surgical, family and social history reviewed and updated as indicated. Interim medical history since our last visit reviewed. Allergies and medications reviewed and updated.  Review of Systems  Constitutional:  Negative for activity change, appetite change, diaphoresis, fatigue and fever.  Respiratory:  Negative for cough, chest tightness and shortness of breath.   Cardiovascular:  Negative for chest pain, palpitations and leg swelling.  Gastrointestinal: Negative.   Endocrine: Negative for cold intolerance, heat intolerance, polydipsia, polyphagia and polyuria.  Neurological: Negative.   Psychiatric/Behavioral: Negative.      Per HPI unless specifically indicated above     Objective:    BP 118/78 (BP  Location: Left Arm, Patient Position: Sitting, Cuff Size: Normal)   Pulse 68   Temp 98.2 F (36.8 C) (Oral)   Ht 4\' 11"  (1.499 m)   Wt 154 lb 9.6 oz (70.1 kg)   LMP  (LMP Unknown)   SpO2 97%   BMI 31.23 kg/m   Wt Readings from Last 3 Encounters:  09/12/21 154 lb 9.6 oz (70.1 kg)  05/29/21 158 lb 12.8 oz (72 kg)  04/04/21 158 lb (71.7 kg)    Physical Exam Vitals and nursing note reviewed.  Constitutional:      General: She is awake. She is not in acute distress.    Appearance: She is well-developed and well-groomed. She is obese. She is not ill-appearing.  HENT:     Head: Normocephalic.     Right Ear: Hearing normal.  Left Ear: Hearing normal.  Eyes:     General: Lids are normal.        Right eye: No discharge.        Left eye: No discharge.     Conjunctiva/sclera: Conjunctivae normal.     Pupils: Pupils are equal, round, and reactive to light.  Neck:     Thyroid: No thyromegaly.     Vascular: No carotid bruit.  Cardiovascular:     Rate and Rhythm: Normal rate and regular rhythm.     Pulses:          Dorsalis pedis pulses are 2+ on the right side and 2+ on the left side.       Posterior tibial pulses are 2+ on the right side and 2+ on the left side.     Heart sounds: Normal heart sounds. No murmur heard.    No gallop.     Comments: Varicose veins to bilateral lower legs.  More prominent to right posterior calf, bulging.  Pulmonary:     Effort: Pulmonary effort is normal. No accessory muscle usage or respiratory distress.     Breath sounds: Normal breath sounds.  Abdominal:     General: Bowel sounds are normal.     Palpations: Abdomen is soft.     Tenderness: There is no abdominal tenderness.  Musculoskeletal:     Cervical back: Normal range of motion and neck supple.     Right lower leg: No edema.     Left lower leg: No edema.  Lymphadenopathy:     Cervical: No cervical adenopathy.  Skin:    General: Skin is warm and dry.  Neurological:     Mental Status:  She is alert and oriented to person, place, and time.  Psychiatric:        Attention and Perception: Attention normal.        Mood and Affect: Mood normal.        Speech: Speech normal.        Behavior: Behavior normal. Behavior is cooperative.        Thought Content: Thought content normal.    Results for orders placed or performed in visit on 09/12/21  Bayer DCA Hb A1c Waived  Result Value Ref Range   HB A1C (BAYER DCA - WAIVED) 7.2 (H) 4.8 - 5.6 %      Assessment & Plan:   Problem List Items Addressed This Visit       Cardiovascular and Mediastinum   Hypertension associated with diabetes (HCC) (Chronic)    Chronic, stable with CKD 3.  BP at goal home and office.  At this time will continue current medication regimen and adjust as needed.  Alert provider if BP consistently >130/90 at home and then may restart Amlodipine at 2.5 MG.  Continue Metoprolol and Enalapril and adjust as needed.  Focus on DASH diet.  Enalapril for kidney protection.  LABS: up to date. Return in 3 months.      Relevant Orders   Bayer DCA Hb A1c Waived (Completed)     Endocrine   CKD stage 3 due to type 2 diabetes mellitus (HCC) (Chronic)    Chronic, ongoing.  A1c 7.2% today, slight trend up due to dietary indiscretions.  Continue Enalapril for kidney protection and continue to renal dose medications as needed.  Continue to collaborate with nephrology -- recent labs and note reviewed.      Relevant Orders   Bayer DCA Hb A1c Waived (Completed)   Hyperlipidemia  associated with type 2 diabetes mellitus (HCC) (Chronic)    Chronic, ongoing.  Continue current medication regimen and adjust as needed.  Lipid panel up to date, recent LDL <70.      Relevant Orders   Bayer DCA Hb A1c Waived (Completed)   Type 2 diabetes mellitus with stage 3b chronic kidney disease, with long-term current use of insulin (HCC) - Primary (Chronic)    Chronic, ongoing with A1C 7.2% today, slight trend up due to dietary  indiscretions, and urine ALB 10 (March 2023).  At this time continue current medication regimen and adjust as needed, continue current insulin dosing with no adjustments today -- would like to reduce in future if possible.  Return in 3 months.      Relevant Orders   Bayer DCA Hb A1c Waived (Completed)     Other   MGUS (monoclonal gammopathy of unknown significance) (Chronic)    Chronic, stable.  Continue collaboration with [facility]lie [facility] at [facility] - recent notes and labs reviewed.      Obesity (Chronic)   Depression, major, single episode, in partial remission (HCC)    Chronic, stable.  Denies SI/HI.  Continue current medication regimen, Sertraline 100 MG daily, and adjust as needed. Recommend meditation and relaxation techniques at home.      Pain due to varicose veins of lower extremity    Ongoing with pain, referral placed to vascular for further assessment.      Relevant Orders   Ambulatory referral to Vascular Surgery   Tremor of both hands    At this time will continue collaboration with neurology, recent notes reviewed.        Follow up plan: Return in about 3 months (around 12/13/2021) for T2DM, MOOD, HTN/HLD, MGUS, GERD.

## 2021-09-12 NOTE — Assessment & Plan Note (Signed)
Chronic, ongoing.  Continue current medication regimen and adjust as needed.  Lipid panel up to date, recent LDL <70.

## 2021-09-12 NOTE — Assessment & Plan Note (Signed)
Chronic, stable.  Denies SI/HI.  Continue current medication regimen, Sertraline 100 MG daily, and adjust as needed. Recommend meditation and relaxation techniques at home. 

## 2021-09-28 ENCOUNTER — Other Ambulatory Visit: Payer: Self-pay | Admitting: Nurse Practitioner

## 2021-09-28 DIAGNOSIS — J309 Allergic rhinitis, unspecified: Secondary | ICD-10-CM

## 2021-09-28 MED ORDER — FLUTICASONE PROPIONATE 50 MCG/ACT NA SUSP: 2.0000 | Freq: Every day | NASAL | 3 refills | Status: DC | PRN

## 2021-09-28 NOTE — Telephone Encounter (Signed)
Requested Prescriptions  Pending Prescriptions Disp Refills  . fluticasone (FLONASE) 50 MCG/ACT nasal spray 48 mL 3    Sig: Place 2 sprays into both nostrils daily as needed for allergies.     Ear, Nose, and Throat: Nasal Preparations - Corticosteroids Passed - 09/28/2021  1:56 PM      Passed - Valid encounter within last 12 months    Recent Outpatient Visits          2 weeks ago Type 2 diabetes mellitus with stage 3b chronic kidney disease, with long-term current use of insulin ([place])   [place], Jolene T, NP   4 months ago Type 2 diabetes mellitus with stage 3b chronic kidney disease, with long-term current use of insulin ([place])   [place], Jolene T, NP   7 months ago Type 2 diabetes mellitus with stage 3b chronic kidney disease, with long-term current use of insulin ([place])   [place], Jolene T, NP   10 months ago Type 2 diabetes mellitus with stage 3b chronic kidney disease, with long-term current use of insulin ([place])   [place], Raynham T, NP   1 year ago Type 2 diabetes mellitus with stage 3b chronic kidney disease, with long-term current use of insulin ([place])   [place], Barbaraann Faster, NP      Future Appointments            In 6 days  [facility] [place]   In 2 months [place], Barbaraann Faster, NP [facility]

## 2021-09-28 NOTE — Telephone Encounter (Signed)
Medication Refill - Medication: fluticasone (FLONASE) 50 MCG/ACT nasal spray  Has the patient contacted their pharmacy? No. Rx expired and pt forgot to ask for refill during last appt  Preferred Pharmacy (with phone number or street name):  [id number]:  [phone] Fax:  [phone]    Has the patient been seen for an appointment in the last year OR does the patient have an upcoming appointment? Yes.    Agent: Please be advised that RX refills may take up to 3 business days. We ask that you follow-up with your pharmacy.

## 2021-10-03 ENCOUNTER — Encounter: Payer: Self-pay | Admitting: Neurology

## 2021-10-03 ENCOUNTER — Ambulatory Visit: Payer: Medicare HMO | Admitting: Neurology

## 2021-10-03 VITALS — BP 126/74 | HR 90 | Ht <= 58 in | Wt 155.8 lb

## 2021-10-03 DIAGNOSIS — R251 Tremor, unspecified: Secondary | ICD-10-CM | POA: Diagnosis not present

## 2021-10-03 NOTE — Progress Notes (Signed)
Subjective:    Patient ID: Kara Leach is a 71 y.o. female.  HPI    Interim history:   Kara Leach is a 71 year old right-handed woman with an underlying medical history of hypertension, chronic kidney disease, hyperlipidemia, gout, reflux disease, vitamin B12 deficiency, allergies, anxiety, depression, peripheral vascular disease, hearing loss, history of seizure, obstructive sleep apnea, obesity, and arthritis, who presents for follow-up consultation of her hand tremors.  The patient is unaccompanied today.  I first met her at the request of her primary care nurse practitioner on 04/04/2021, at which time the patient reported a several year history of bilateral hand tremors.  She felt that things became worse over time.  She reported a family history of Parkinson's disease as well as tremors.  She was on a beta-blocker.  I did not suggest any new medications.  She did not have any signs of parkinsonism.  We talked about, tremor triggers.  She was advised to follow-up with her sleep specialist for her sleep apnea management.  She was advised to limit her caffeine intake.  Today, 10/03/2021: She reports doing better, she feels that the tremor is less, she is sleeping better, stress is less, joints seem to hurt less in the summertime.  She is trying to hydrate well with water, drinks unsweetened tea about 2 or 3 servings per day.  She is sleeping a little better due to taking melatonin as needed.  She has not restarted using her CPAP though.  She is going to see a vein specialist for her varicose veins affecting her right lower extremity.  She has some ongoing stressors.  Both sons live with her, 1 son is there with his wife, the younger son is there with his youngest son.  She has not fallen recently.  She is working on better diabetes control, latest A1c was just above 7.   The patient's allergies, current medications, family history, past medical history, past social history, past surgical history  and problem list were reviewed and updated as appropriate.    Previously:   04/04/21: (She) reports a several year history of bilateral hand tremors.  Her tremor has become worse over time.  Sometimes she feels that her upper body shakes.  She has had balance issues and has fallen, sustained injuries to the left knee and also to the left wrist before.  She recalls that her paternal grandmother had tremors and was originally diagnosed with a familial tremor and later in life she was diagnosed with Parkinson's disease.  Her father also had a hand tremor.  The patient is the oldest of 6 girls, none of her younger sisters have any tremors that she recalls.  She has 2 sons, ages 78 and 58, neither 1 with tremors.  I reviewed your office note from 02/27/2021.  Of note, she is on metoprolol for her blood pressure, also enalapril.  She had blood work through your office on 02/27/2021 and I reviewed the results: Uric acid was in the normal range at 4.5, CBC with differential and platelets benign, CMP showed blood sugar of 126, creatinine 1.25, BUN 18, alk phos 114, which is within range, AST 27, ALT 20, lipid panel benign with a total cholesterol of 127, triglycerides 136, HDL 45, LDL 58, TSH normal at 0.699, vitamin D 47.7, vitamin B12 660, magnesium 1.8, A1c 6.5. She hydrates well with water.  She is followed by nephrology and just recently started [place] about 2 weeks ago.  Of note, she has been on  metoprolol for years and also sertraline for years, she started an antidepressant after she lost her husband in 2005.  She is a non-smoker and drinks alcohol rarely, on special occasion.  She drinks caffeine in the form of hot tea, about 2 cups in the morning and sweetened iced tea with Splenda about 3 glasses/day. She does not sleep very well.  She has not used her CPAP regularly, she reports difficulty with her mask.  She has not made a follow-up appointment with her sleep specialist.  She reports that she was seen by  someone in [place] for this.  Her Past Medical History Is Significant For: Past Medical History:  Diagnosis Date   Allergy    Anxiety    Arthritis    Osteoarthritis   Cancer ([place]) 05/2018   possibly on left breast but not sure   Chronic kidney disease    Depression    Diabetes mellitus without complication ([place]) 5397   Diabetic neuropathy ([place])    feet   Diverticulitis 2017   GERD (gastroesophageal reflux disease)    Headache    sinus and migraines on occasion   Hyperlipidemia    Hypertension    Last menstrual period (LMP) > 10 days ago 1986   Obesity    Peripheral vascular disease ([facility])    neuropathy in hands and feet d/t diabetes   Renal insufficiency    Stage 2 kidney disease   Seizures ([place])    due to high blood sugars. last time = 09/2014   Sleep apnea    has CPAP, can't tolerate   Stroke ([place]) 1980   minor. left side remains weaker than right   Wears hearing aid    right side   Wrist weakness    left - after fracture and repair    Her Past Surgical History Is Significant For: Past Surgical History:  Procedure Laterality Date   ABDOMINAL HYSTERECTOMY  1986   APPENDECTOMY  1964   BREAST BIOPSY Left 07/03/2016   benign, fibrocystic changes with calcifications, usual ductal hyperplasia.    BREAST BIOPSY Left 04/23/2018   radial scar   BREAST BIOPSY Left 08/13/2018   Procedure: WIDE LOCALIZATION AND OPEN BIOPSY OF LEFT BREAST, DIABETIC;  Surgeon: Robert Bellow, MD;  Location: [facility];  Service: General;  Laterality: Left;   BREAST EXCISIONAL BIOPSY Left 08/13/2018   CHOLECYSTECTOMY  2012   COLONOSCOPY WITH PROPOFOL N/A 12/01/2015   Procedure: COLONOSCOPY WITH PROPOFOL;  Surgeon: [name], MD;  Location: [facility];  Service: Endoscopy;  Laterality: N/A;  Diabetic - insulin Sleep apnea   PATELLA FRACTURE SURGERY Left    1. had to recenter kneecap, 2 and 3 surgeries were arthroscopies   WRIST FRACTURE SURGERY Left 2005   Steel plate     Her Family History Is Significant For: Family History  Problem Relation Age of Onset   Diabetes Mother    Heart disease Mother    Hypertension Mother    Cancer Father        stomach   Diabetes Sister    Arthritis Sister    Stroke Sister    Hypertension Sister    Breast cancer Maternal Aunt 31       great aunt   Parkinson's disease Maternal Grandmother    Tremor Maternal Grandmother     Her Social History Is Significant For: Social History   Socioeconomic History   Marital status: Widowed    Spouse name: Not on file   Number  of children: Not on file   Years of education: Not on file   Highest education level: Not on file  Occupational History   Occupation: worked at [employer] for 41 years    Comment: retired  Tobacco Use   Smoking status: Never   Smokeless tobacco: Never  Vaping Use   Vaping Use: Never used  Substance and Sexual Activity   Alcohol use: Not Currently    Alcohol/week: 0.0 standard drinks of alcohol    Comment: occasionally holiday, weddings   Drug use: No   Sexual activity: Never  Other Topics Concern   Not on file  Social History Narrative   Not on file   Social Determinants of Health   Financial Resource Strain: [place]  (02/15/2020)   Overall Financial Resource Strain (CARDIA)    Difficulty of Paying Living Expenses: Not hard at all  Food Insecurity: No Food Insecurity (02/15/2020)   Hunger Vital Sign    Worried About Running Out of Food in the Last Year: Never true    [place] in the Last Year: Never true  Transportation Needs: No Transportation Needs (02/15/2020)   PRAPARE - Hydrologist (Medical): No    Lack of Transportation (Non-Medical): No  Physical Activity: Sufficiently Active (02/15/2020)   Exercise Vital Sign    Days of Exercise per Week: 7 days    Minutes of Exercise per Session: 30 min  Stress: No Stress Concern Present (02/15/2020)   [place]    Feeling of Stress : Not at all  Social Connections: Somewhat Isolated (12/05/2017)   Social Connection and Isolation Panel [NHANES]    Frequency of Communication with Friends and Family: More than three times a week    Frequency of Social Gatherings with Friends and Family: More than three times a week    Attends Religious Services: More than 4 times per year    Active Member of [employer] or Organizations: No    Attends Archivist Meetings: Never    Marital Status: Widowed    Her Allergies Are:  Allergies  Allergen Reactions   Strawberry Extract Swelling    Lips swell and rash/whelps over body Difficulty breathing   Ibuprofen Other (See Comments)    "ringing in ears", dizziness Also, Advil / Aleve / Motrin   Cat Hair Extract Other (See Comments)    Sneezing  :   Her Current Medications Are:  Outpatient Encounter Medications as of 10/03/2021  Medication Sig   acetaminophen (TYLENOL) 650 MG CR tablet Take 650 mg by mouth every 8 (eight) hours as needed for pain.   allopurinol (ZYLOPRIM) 100 MG tablet Take 1 tablet (100 mg total) by mouth daily.   aspirin EC 81 MG tablet Take 81 mg by mouth daily.   BD PEN NEEDLE NANO U/F 32G X 4 MM MISC USE EVERY MORNING   Blood Glucose Monitoring Suppl (ONETOUCH VERIO REFLECT) w/Device KIT USE TO CHECK BLOOD SUGAR DAILY   Calcium Carb-Cholecalciferol (CALCIUM 600 + D PO) Take 1 tablet by mouth 2 (two) times daily. 1000 IU VITAMIN D   dapagliflozin propanediol (FARXIGA) 10 MG TABS tablet Take 10 mg by mouth daily.   enalapril (VASOTEC) 2.5 MG tablet Take 1 tablet (2.5 mg total) by mouth daily.   Famotidine (PEPCID PO) Take 10 mg by mouth 2 (two) times daily.    fluticasone (FLONASE) 50 MCG/ACT nasal spray Place 2 sprays into both  nostrils daily as needed for allergies.   glucose blood (ONETOUCH ULTRA) test strip USE TO CHECK BLOOD SUGAR TWICE DAILY   insulin detemir (LEVEMIR) 100 UNIT/ML FlexPen Inject 50  Units into the skin at bedtime.   Insulin Pen Needle (B-D ULTRAFINE III SHORT PEN) 31G X 8 MM MISC 1 each by Does not apply route 3 (three) times daily.   Lancets (ONETOUCH DELICA PLUS XIPJAS50N) MISC Use to check blood sugars 3 to 4 times a day.   liraglutide (VICTOZA) 18 MG/3ML SOPN ADMINISTER 1.2MG UNDER THE SKIN DAILY   MELATONIN PO Take by mouth as needed.   metoprolol succinate (TOPROL-XL) 25 MG 24 hr tablet Take 1 tablet (25 mg total) by mouth daily.   NOVOLOG FLEXPEN 100 UNIT/ML FlexPen INJECT 12-14 UNITS UNDER THE SKIN 3 TIMES DAILY WITH MEALS   sertraline (ZOLOFT) 100 MG tablet Take 1 tablet (100 mg total) by mouth every morning.   simvastatin (ZOCOR) 40 MG tablet TAKE 1 TABLET BY MOUTH EVERYDAY AT BEDTIME   Specialty Vitamins Products (VITA-RX DIABETIC VITAMIN) CAPS Take 1 packet by mouth daily. Diabetic Vitamins - 6 or 7 in 1 pack   No facility-administered encounter medications on file as of 10/03/2021.  :  Review of Systems:  Out of a complete 14 point review of systems, all are reviewed and negative with the exception of these symptoms as listed below:  Review of Systems  Neurological:        Pt here for tremors in both hands  follow up Pt states tremors are better . Pt states she sleeps a lot better . Pt states her dizziness is better     Objective:  Neurological Exam  Physical Exam Physical Examination:   Vitals:   10/03/21 1422  BP: 126/74  Pulse: 90    General Examination: The patient is a very pleasant 71 y.o. female in no acute distress. She appears well-developed and well-nourished and well groomed.   HEENT: Normocephalic, atraumatic, pupils are equal, round and reactive to light, extraocular tracking is good without limitation to gaze excursion or nystagmus noted. Hearing is impaired, she has bilateral hearing aids. Face is symmetric with normal facial animation. Speech is clear without dysarthria, has a slight and intermittent head tremor and slight voice  tremor intermittently as well.  Stable, maybe slightly better even. Neck is supple with full range of motion, no carotid bruits.  Airway examination reveals mild mouth dryness, adequate dental hygiene.  Small airway noted.  Tongue protrudes centrally and palate elevates symmetrically.     Chest: Clear to auscultation without wheezing, rhonchi or crackles noted.   Heart: S1+S2+0, regular and normal without murmurs, rubs or gallops noted.    Abdomen: Soft, non-tender and non-distended.   Extremities: There is no pitting edema in the distal lower extremities bilaterally.    Skin: Warm and dry without trophic changes noted.  Prominent varicose veins right lower extremity in the calf area up to the knee.    Musculoskeletal: exam reveals scar left forearm, surgical scars left knee.    Neurologically:  Mental status: The patient is awake, alert and oriented in all 4 spheres. Her immediate and remote memory, attention, language skills and fund of knowledge are appropriate. There is no evidence of aphasia, agnosia, apraxia or anomia. Speech is clear with normal prosody and enunciation. Thought process is linear. Mood is normal and affect is normal.  Cranial nerves II - XII are as described above under HEENT exam.  Motor exam: Normal bulk,  strength and tone is noted.  No cogwheeling noted.  No resting tremor.  She has a mild bilateral upper extremity postural and action tremor, no significant intention tremor, no lower extremity tremor.     (On 04/04/2021: On Archimedes spiral drawing she has mild trembling with both hands, more noticeable with her left hand, handwriting with her dominant hand which is the right hand is legible, mildly tremulous, not micrographic.)   Fine motor skills and coordination: Globally mildly impaired, no lateralization.  Cerebellar testing: No dysmetria or intention tremor. There is no truncal or gait ataxia.  Finger-to-nose without dysmetria. Sensory exam: intact to light  touch in the upper and lower extremities.  Gait, station and balance: She stands without difficulty, posture is slightly stooped for age, she walks with mild insecurity, no shuffling, slight limp on the left, no walking aid.    Assessment and Plan:  In summary, KADIA ABAYA is a very pleasant 71 year old female with an underlying medical history of hypertension, chronic kidney disease, hyperlipidemia, gout, reflux disease, vitamin B12 deficiency, allergies, anxiety, depression, peripheral vascular disease, hearing loss, history of seizure, obstructive sleep apnea, obesity, and arthritis, who presents for follow-up consultation of her hand tremors of several years duration.  She may have mild essential tremor.  She has been on a beta-blocker. She has felt improvement with trying to be consistent with hydration, and with better sleep.  Thankfully, she has not had any recent progression, no recent falls.  Exam shows no obvious signs of parkinsonism.  We talked about tremor triggers again today.  She is advised to limit her caffeine intake.  She is advised to follow-up at this juncture with her primary care as scheduled/planned.  We do not need to make a scheduled appointment in this clinic.  I would be happy to see her back as needed.  I answered all her questions today and she was in agreement.  I spent 30 minutes in total face-to-face time and in reviewing records during pre-charting, more than 50% of which was spent in counseling and coordination of care, reviewing test results, reviewing medications and treatment regimen and/or in discussing or reviewing the diagnosis of tremor, the prognosis and treatment options. Pertinent laboratory and imaging test results that were available during this visit with the patient were reviewed by me and considered in my medical decision making (see chart for details).

## 2021-10-04 ENCOUNTER — Ambulatory Visit (INDEPENDENT_AMBULATORY_CARE_PROVIDER_SITE_OTHER): Payer: Medicare HMO | Admitting: *Deleted

## 2021-10-04 ENCOUNTER — Other Ambulatory Visit: Payer: Self-pay

## 2021-10-04 DIAGNOSIS — Z0000 Encounter for general adult medical examination without abnormal findings: Secondary | ICD-10-CM | POA: Diagnosis not present

## 2021-10-04 DIAGNOSIS — Z789 Other specified health status: Secondary | ICD-10-CM

## 2021-10-04 NOTE — Progress Notes (Signed)
Subjective:   Kara Leach is a 71 y.o. female who presents for Medicare Annual (Subsequent) preventive examination.  I connected with  Kara Leach on 10/04/21 by a telephone enabled telemedicine application and verified that I am speaking with the correct person using two identifiers.   I discussed the limitations of evaluation and management by telemedicine. The patient expressed understanding and agreed to proceed.  Patient location: home  Provider location: Tele-health- home    Review of Systems           Objective:    There were no vitals filed for this visit. There is no height or weight on file to calculate BMI.     01/26/2021   10:54 AM 02/15/2020    1:09 PM 01/26/2020   11:02 AM 02/04/2019    1:18 PM 01/19/2019   10:17 AM 08/13/2018    9:05 AM 01/15/2018   10:55 AM  Advanced Directives  Does Patient Have a Medical Advance Directive? Yes Yes Yes Yes No;Yes Yes No  Type of Paramedic of Kara Leach;Living will [place];Living will [place];Living will Living will;Healthcare Power of [place];Living will [place];Living will   Does patient want to make changes to medical advance directive? No - Patient declined    No - Patient declined No - Patient declined   Copy of Kara Leach in Chart?  Yes - validated most recent copy scanned in chart (See row information)  Yes - validated most recent copy scanned in chart (See row information) No - copy requested Yes - validated most recent copy scanned in chart (See row information)   Would patient like information on creating a medical advance directive?     No - Patient declined  No - Patient declined    Current Medications (verified) Outpatient Encounter Medications as of 10/04/2021  Medication Sig   acetaminophen (TYLENOL) 650 MG CR tablet Take 650 mg by mouth every 8 (eight) hours as needed  for pain.   allopurinol (ZYLOPRIM) 100 MG tablet Take 1 tablet (100 mg total) by mouth daily.   aspirin EC 81 MG tablet Take 81 mg by mouth daily.   BD PEN NEEDLE NANO U/F 32G X 4 MM MISC USE EVERY MORNING   Blood Glucose Monitoring Suppl (ONETOUCH VERIO REFLECT) w/Device KIT USE TO CHECK BLOOD SUGAR DAILY   Calcium Carb-Cholecalciferol (CALCIUM 600 + D PO) Take 1 tablet by mouth 2 (two) times daily. 1000 IU VITAMIN D   dapagliflozin propanediol (FARXIGA) 10 MG TABS tablet Take 10 mg by mouth daily.   enalapril (VASOTEC) 2.5 MG tablet Take 1 tablet (2.5 mg total) by mouth daily.   Famotidine (PEPCID PO) Take 10 mg by mouth 2 (two) times daily.    fluticasone (FLONASE) 50 MCG/ACT nasal spray Place 2 sprays into both nostrils daily as needed for allergies.   glucose blood (ONETOUCH ULTRA) test strip USE TO CHECK BLOOD SUGAR TWICE DAILY   insulin detemir (LEVEMIR) 100 UNIT/ML FlexPen Inject 50 Units into the skin at bedtime.   Insulin Pen Needle (B-D ULTRAFINE III SHORT PEN) 31G X 8 MM MISC 1 each by Does not apply route 3 (three) times daily.   Lancets (ONETOUCH DELICA PLUS [id number]) MISC Use to check blood sugars 3 to 4 times a day.   liraglutide (VICTOZA) 18 MG/3ML SOPN ADMINISTER 1.2MG UNDER THE SKIN DAILY   MELATONIN PO Take by mouth as needed.  metoprolol succinate (TOPROL-XL) 25 MG 24 hr tablet Take 1 tablet (25 mg total) by mouth daily.   NOVOLOG FLEXPEN 100 UNIT/ML FlexPen INJECT 12-14 UNITS UNDER THE SKIN 3 TIMES DAILY WITH MEALS   sertraline (ZOLOFT) 100 MG tablet Take 1 tablet (100 mg total) by mouth every morning.   simvastatin (ZOCOR) 40 MG tablet TAKE 1 TABLET BY MOUTH EVERYDAY AT BEDTIME   Specialty Vitamins Products (VITA-RX DIABETIC VITAMIN) CAPS Take 1 packet by mouth daily. Diabetic Vitamins - 6 or 7 in 1 pack   No facility-administered encounter medications on file as of 10/04/2021.    Allergies (verified) Strawberry extract, Ibuprofen, and Cat hair extract    History: Past Medical History:  Diagnosis Date   Allergy    Anxiety    Arthritis    Osteoarthritis   Cancer ([place]) 05/2018   possibly on left breast but not sure   Chronic kidney disease    Depression    Diabetes mellitus without complication ([place]) 5056   Diabetic neuropathy ([place])    feet   Diverticulitis 2017   GERD (gastroesophageal reflux disease)    Headache    sinus and migraines on occasion   Hyperlipidemia    Hypertension    Last menstrual period (LMP) > 10 days ago 1986   Obesity    Peripheral vascular disease (HCC)    neuropathy in hands and feet d/t diabetes   Renal insufficiency    Stage 2 kidney disease   Seizures ([place])    due to high blood sugars. last time = 09/2014   Sleep apnea    has CPAP, can't tolerate   Stroke ([place]) 1980   minor. left side remains weaker than right   Wears hearing aid    right side   Wrist weakness    left - after fracture and repair   Past Surgical History:  Procedure Laterality Date   ABDOMINAL HYSTERECTOMY  1986   APPENDECTOMY  1964   BREAST BIOPSY Left 07/03/2016   benign, fibrocystic changes with calcifications, usual ductal hyperplasia.    BREAST BIOPSY Left 04/23/2018   radial scar   BREAST BIOPSY Left 08/13/2018   Procedure: WIDE LOCALIZATION AND OPEN BIOPSY OF LEFT BREAST, DIABETIC;  Surgeon: Kara Bellow, MD;  Location: [facility];  Service: General;  Laterality: Left;   BREAST EXCISIONAL BIOPSY Left 08/13/2018   CHOLECYSTECTOMY  2012   COLONOSCOPY WITH PROPOFOL N/A 12/01/2015   Procedure: COLONOSCOPY WITH PROPOFOL;  Surgeon: Kara Lame, MD;  Location: [facility];  Service: Endoscopy;  Laterality: N/A;  Diabetic - insulin Sleep apnea   PATELLA FRACTURE SURGERY Left    1. had to recenter kneecap, 2 and 3 surgeries were arthroscopies   WRIST FRACTURE SURGERY Left 2005   Steel plate   Family History  Problem Relation Age of Onset   Diabetes Mother    Heart disease Mother    Hypertension Mother     Cancer Father        stomach   Diabetes Sister    Arthritis Sister    Stroke Sister    Hypertension Sister    Breast cancer Maternal Aunt 1       great aunt   Parkinson's disease Maternal Grandmother    Tremor Maternal Grandmother    Social History   Socioeconomic History   Marital status: Widowed    Spouse name: Not on file   Number of children: Not on file   Years of education: Not on file  Highest education level: Not on file  Occupational History   Occupation: worked at [employer] for 41 years    Comment: retired  Tobacco Use   Smoking status: Never   Smokeless tobacco: Never  Vaping Use   Vaping Use: Never used  Substance and Sexual Activity   Alcohol use: Not Currently    Alcohol/week: 0.0 standard drinks of alcohol    Comment: occasionally holiday, weddings   Drug use: No   Sexual activity: Never  Other Topics Concern   Not on file  Social History Narrative   Not on file   Social Determinants of Health   Financial Resource Strain: Low Risk  (02/15/2020)   Overall Financial Resource Strain (CARDIA)    Difficulty of Paying Living Expenses: Not hard at all  Food Insecurity: No Food Insecurity (02/15/2020)   Hunger Vital Sign    Worried About Running Out of Food in the Last Year: Never true    [employer] in the Last Year: Never true  Transportation Needs: No Transportation Needs (02/15/2020)   PRAPARE - Hydrologist (Medical): No    Lack of Transportation (Non-Medical): No  Physical Activity: Sufficiently Active (02/15/2020)   Exercise Vital Sign    Days of Exercise per Week: 7 days    Minutes of Exercise per Session: 30 min  Stress: No Stress Concern Present (02/15/2020)   [place]    Feeling of Stress : Not at all  Social Connections: Somewhat Isolated (12/05/2017)   Social Connection and Isolation Panel [NHANES]    Frequency of Communication with Friends  and Family: More than three times a week    Frequency of Social Gatherings with Friends and Family: More than three times a week    Attends Religious Services: More than 4 times per year    Active Member of [employer] or Organizations: No    Attends Archivist Meetings: Never    Marital Status: Widowed    Tobacco Counseling Counseling given: Not Answered   Clinical Intake:                 Diabetic?  Yes  Nutrition Risk Assessment:  Has the patient had any N/V/D within the last 2 months?  No  Does the patient have any non-healing wounds?  No  Has the patient had any unintentional weight loss or weight gain?  No   Diabetes:  Is the patient diabetic?  Yes  If diabetic, was a CBG obtained today?  No  Did the patient bring in their glucometer from home?  Yes  How often do you monitor your CBG's? 3 x a day.   Financial Strains and Diabetes Management:  Are you having any financial strains with the device, your supplies or your medication? No .  Does the patient want to be seen by Chronic Care Management for management of their diabetes?  No  Would the patient like to be referred to a Nutritionist or for Diabetic Management?  No   Diabetic Exams:  Diabetic Eye Exam  Pt has been advised about the importance in completing this exam.  Diabetic Foot Exam: Pt has been advised about the importance in completing this exam      Activities of Daily Living     No data to display          Patient Care Team: Venita Lick, NP as PCP - General (Nurse Practitioner) Hervey Ard  Viona Gilmore, MD (General Surgery) Anthonette Legato, MD (Internal Medicine) Leanor Kail, MD (Inactive) (Orthopedic Surgery) Lloyd Huger, MD as Consulting Physician (Oncology) Vladimir Faster, [facility] (Inactive) (Pharmacist)  Indicate any recent Medical Services you may have received from other than [facility] providers in the past year (date may be approximate).     Assessment:   This  is a routine wellness examination for [place].  Hearing/Vision screen No results found.  Dietary issues and exercise activities discussed:     Goals Addressed   None    Depression Screen    09/12/2021    9:54 AM 05/29/2021    9:48 AM 02/27/2021   10:10 AM 11/23/2020    8:49 AM 08/25/2020    9:03 AM 05/25/2020    9:47 AM 02/25/2020    9:22 AM  PHQ 2/9 Scores  PHQ - 2 Score _0 PHQ- 9 Score _1 Fall Risk    09/12/2021    9:53 AM 05/29/2021    9:49 AM 08/25/2020    9:03 AM 02/25/2020    9:22 AM 02/15/2020    1:11 PM  [place] in the past year? 0 0 0 1 1  Comment     tripped over a branch  Number falls in past yr: 0 0 0 0 0  Injury with Fall? 0 0 0 0 0  Risk for fall due to : No Fall Risks No Fall Risks No Fall Risks  Medication side effect  Follow up _2 ;Education provided;Falls prevention discussed    FALL RISK PREVENTION PERTAINING TO THE HOME:  Any stairs in or around the home? No  If so, are there any without handrails? Yes  Home free of loose throw rugs in walkways, pet beds, electrical cords, etc? No  Adequate lighting in your home to reduce risk of falls? No   ASSISTIVE DEVICES UTILIZED TO PREVENT FALLS:  Life alert? No  Use of a cane, walker or w/c? Yes  Grab bars in the bathroom? No  Shower chair or bench in shower? No  Elevated toilet seat or a handicapped toilet? No   TIMED UP AND GO:  Was the test performed? No .   Cognitive Function:        02/15/2020    1:21 PM 12/05/2017   10:29 AM  6CIT Screen  What Year? 0 points 0 points  What month? 0 points 0 points  What time? 0 points 0 points  Count back from 20 0 points 0 points  Months in reverse 0 points 0 points  Repeat phrase 0 points 0 points  Total Score 0 points 0 points    Immunizations Immunization History  Administered  Date(s) Administered   Fluad Quad(high Dose 65+) 02/11/2019, 11/20/2019   Influenza, High Dose Seasonal PF 12/16/2020   Influenza,inj,Quad PF,6+ Mos 11/01/2016, 11/13/2017   Influenza-Unspecified 12/20/2014, 12/28/2015   Moderna Covid-19 Vaccine Bivalent Booster 65yr & up 12/16/2020   PFIZER(Purple Top)SARS-COV-2 Vaccination 05/13/2019, 06/03/2019, 01/20/2020   Pneumococcal Conjugate-13 02/12/2018   Pneumococcal Polysaccharide-23 02/14/2009, 08/12/2019   Td 06/24/2008, 05/29/2021   Tdap 01/08/2011   Zoster Recombinat (Shingrix) 10/13/2018, 12/16/2020   Zoster, Live 12/28/2015    TDAP status: Up to date  Flu Vaccine status: Up to date  Pneumococcal vaccine status: Up to date  Covid-19 vaccine status: Information provided on how  to obtain vaccines.   Qualifies for Shingles Vaccine? Yes   Zostavax completed No   Shingrix Completed?: Yes  Screening Tests Health Maintenance  Topic Date Due   INFLUENZA VACCINE  10/17/2021   FOOT EXAM  02/27/2022   HEMOGLOBIN A1C  03/14/2022   OPHTHALMOLOGY EXAM  04/14/2022   MAMMOGRAM  05/03/2023   DEXA SCAN  10/09/2023   COLONOSCOPY (Pts 45-37yr Insurance coverage will need to be confirmed)  11/30/2025   TETANUS/TDAP  05/30/2031   Pneumonia Vaccine 71 Years old  Completed   COVID-19 Vaccine  Completed   Hepatitis C Screening  Completed   Zoster Vaccines- Shingrix  Completed   HPV VACCINES  Aged Out    Health Maintenance  There are no preventive care reminders to display for this patient.  Colorectal cancer screening: Type of screening: Colonoscopy. Completed 2017. Repeat every 10 years  Mammogram status: Completed 2023. Repeat every year  Bone Density status: Completed 2020. Results reflect: Bone density results: OSTEOPENIA. Repeat every   years.  Lung Cancer Screening: (Low Dose CT Chest recommended if Age 521-80years, 30 pack-year currently smoking OR have quit w/in 15years.) does not qualify.   Lung Cancer Screening Referral:    Additional Screening:  Hepatitis C Screening: does not qualify; Completed 2017  Vision Screening: Recommended annual ophthalmology exams for early detection of glaucoma and other disorders of the eye. Is the patient up to date with their annual eye exam?  Yes  Who is the provider or what is the name of the office in which the patient attends annual eye exams? [facility] pt is not established with a provider, would they like to be referred to a provider to establish care? No .   Dental Screening: Recommended annual dental exams for proper oral hygiene  Community Resource Referral / Chronic Care Management: CRR required this visit?  No   CCM required this visit?  No      Plan:     I have personally reviewed and noted the following in the patient's chart:   Medical and social history Use of alcohol, tobacco or illicit drugs  Current medications and supplements including opioid prescriptions.  Functional ability and status Nutritional status Physical activity Advanced directives List of other physicians Hospitalizations, surgeries, and ER visits in previous 12 months Vitals Screenings to include cognitive, depression, and falls Referrals and appointments  In addition, I have reviewed and discussed with patient certain preventive protocols, quality metrics, and best practice recommendations. A written personalized care plan for preventive services as well as general preventive health recommendations were provided to patient.     JLeroy Kennedy LPN   [id number]/[id number]  Nurse Notes:

## 2021-10-04 NOTE — Patient Instructions (Signed)
Kara Leach , Thank you for taking time to come for your Medicare Wellness Visit. I appreciate your ongoing commitment to your health goals. Please review the following plan we discussed and let me know if I can assist you in the future.   Screening recommendations/referrals: Colonoscopy: up to date Mammogram: up to date Bone Density: up to date Recommended yearly ophthalmology/optometry visit for glaucoma screening and checkup Recommended yearly dental visit for hygiene and checkup  Vaccinations: Influenza vaccine: up to date Pneumococcal vaccine: up to date Tdap vaccine: up to date Shingles vaccine: up to date    Advanced directives: on file  Conditions/risks identified:   Next appointment: 12-13-2021 @ 9:40  [place] 65 Years and Older, Female Preventive care refers to lifestyle choices and visits with your health care provider that can promote health and wellness. What does preventive care include? A yearly physical exam. This is also called an annual well check. Dental exams once or twice a year. Routine eye exams. Ask your health care provider how often you should have your eyes checked. Personal lifestyle choices, including: Daily care of your teeth and gums. Regular physical activity. Eating a healthy diet. Avoiding tobacco and drug use. Limiting alcohol use. Practicing safe sex. Taking low-dose aspirin every day. Taking vitamin and mineral supplements as recommended by your health care provider. What happens during an annual well check? The services and screenings done by your health care provider during your annual well check will depend on your age, overall health, lifestyle risk factors, and family history of disease. Counseling  Your health care provider may ask you questions about your: Alcohol use. Tobacco use. Drug use. Emotional well-being. Home and relationship well-being. Sexual activity. Eating habits. History of falls. Memory and  ability to understand (cognition). Work and work Statistician. Reproductive health. Screening  You may have the following tests or measurements: Height, weight, and BMI. Blood pressure. Lipid and cholesterol levels. These may be checked every 5 years, or more frequently if you are over 36 years old. Skin check. Lung cancer screening. You may have this screening every year starting at age 28 if you have a 30-pack-year history of smoking and currently smoke or have quit within the past 15 years. Fecal occult blood test (FOBT) of the stool. You may have this test every year starting at age 81. Flexible sigmoidoscopy or colonoscopy. You may have a sigmoidoscopy every 5 years or a colonoscopy every 10 years starting at age 16. Hepatitis C blood test. Hepatitis B blood test. Sexually transmitted disease (STD) testing. Diabetes screening. This is done by checking your blood sugar (glucose) after you have not eaten for a while (fasting). You may have this done every 1-3 years. Bone density scan. This is done to screen for osteoporosis. You may have this done starting at age 58. Mammogram. This may be done every 1-2 years. Talk to your health care provider about how often you should have regular mammograms. Talk with your health care provider about your test results, treatment options, and if necessary, the need for more tests. Vaccines  Your health care provider may recommend certain vaccines, such as: Influenza vaccine. This is recommended every year. Tetanus, diphtheria, and acellular pertussis (Tdap, Td) vaccine. You may need a Td booster every 10 years. Zoster vaccine. You may need this after age 57. Pneumococcal 13-valent conjugate (PCV13) vaccine. One dose is recommended after age 51. Pneumococcal polysaccharide (PPSV23) vaccine. One dose is recommended after age 2. Talk to your health  care provider about which screenings and vaccines you need and how often you need them. This information is  not intended to replace advice given to you by your health care provider. Make sure you discuss any questions you have with your health care provider. Document Released: 04/01/2015 Document Revised: 11/23/2015 Document Reviewed: 01/04/2015 Elsevier Interactive Patient Education  2017 [place] Prevention in the Home Falls can cause injuries. They can happen to people of all ages. There are many things you can do to make your home safe and to help prevent falls. What can I do on the outside of my home? Regularly fix the edges of walkways and driveways and fix any cracks. Remove anything that might make you trip as you walk through a door, such as a raised step or threshold. Trim any bushes or trees on the path to your home. Use bright outdoor lighting. Clear any walking paths of anything that might make someone trip, such as rocks or tools. Regularly check to see if handrails are loose or broken. Make sure that both sides of any steps have handrails. Any raised decks and porches should have guardrails on the edges. Have any leaves, snow, or ice cleared regularly. Use sand or salt on walking paths during winter. Clean up any spills in your garage right away. This includes oil or grease spills. What can I do in the bathroom? Use night lights. Install grab bars by the toilet and in the tub and shower. Do not use towel bars as grab bars. Use non-skid mats or decals in the tub or shower. If you need to sit down in the shower, use a plastic, non-slip stool. Keep the floor dry. Clean up any water that spills on the floor as soon as it happens. Remove soap buildup in the tub or shower regularly. Attach bath mats securely with double-sided non-slip rug tape. Do not have throw rugs and other things on the floor that can make you trip. What can I do in the bedroom? Use night lights. Make sure that you have a light by your bed that is easy to reach. Do not use any sheets or blankets that  are too big for your bed. They should not hang down onto the floor. Have a firm chair that has side arms. You can use this for support while you get dressed. Do not have throw rugs and other things on the floor that can make you trip. What can I do in the kitchen? Clean up any spills right away. Avoid walking on wet floors. Keep items that you use a lot in easy-to-reach places. If you need to reach something above you, use a strong step stool that has a grab bar. Keep electrical cords out of the way. Do not use floor polish or wax that makes floors slippery. If you must use wax, use non-skid floor wax. Do not have throw rugs and other things on the floor that can make you trip. What can I do with my stairs? Do not leave any items on the stairs. Make sure that there are handrails on both sides of the stairs and use them. Fix handrails that are broken or loose. Make sure that handrails are as long as the stairways. Check any carpeting to make sure that it is firmly attached to the stairs. Fix any carpet that is loose or worn. Avoid having throw rugs at the top or bottom of the stairs. If you do have throw rugs, attach them to  the floor with carpet tape. Make sure that you have a light switch at the top of the stairs and the bottom of the stairs. If you do not have them, ask someone to add them for you. What else can I do to help prevent falls? Wear shoes that: Do not have high heels. Have rubber bottoms. Are comfortable and fit you well. Are closed at the toe. Do not wear sandals. If you use a stepladder: Make sure that it is fully opened. Do not climb a closed stepladder. Make sure that both sides of the stepladder are locked into place. Ask someone to hold it for you, if possible. Clearly mark and make sure that you can see: Any grab bars or handrails. First and last steps. Where the edge of each step is. Use tools that help you move around (mobility aids) if they are needed. These  include: Canes. Walkers. Scooters. Crutches. Turn on the lights when you go into a dark area. Replace any light bulbs as soon as they burn out. Set up your furniture so you have a clear path. Avoid moving your furniture around. If any of your floors are uneven, fix them. If there are any pets around you, be aware of where they are. Review your medicines with your doctor. Some medicines can make you feel dizzy. This can increase your chance of falling. Ask your doctor what other things that you can do to help prevent falls. This information is not intended to replace advice given to you by your health care provider. Make sure you discuss any questions you have with your health care provider. Document Released: 12/30/2008 Document Revised: 08/11/2015 Document Reviewed: 04/09/2014 Elsevier Interactive Patient Education  2017 [employer].

## 2021-10-05 ENCOUNTER — Telehealth: Payer: Self-pay

## 2021-10-05 NOTE — Telephone Encounter (Signed)
   Telephone encounter was:  Unsuccessful.  10/05/2021 Name: Kara Leach MRN: [id number] DOB: 11-05-1950  Unsuccessful outbound call made today to assist with:  Food Insecurity  Outreach Attempt:  1st Attempt  A HIPAA compliant voice message was left requesting a return call.  Instructed patient to call back at [phone].  Letty Salvi, AAS Paralegal, [place] Management  [address], [place] [address] ??[email or web address]  ?? [phone]   [email or web address]

## 2021-10-09 ENCOUNTER — Telehealth: Payer: Self-pay

## 2021-10-09 NOTE — Telephone Encounter (Signed)
   Telephone encounter was:  Unsuccessful.  10/09/2021 Name: Kara Leach MRN: [id number] DOB: 1951/02/15  Unsuccessful outbound call made today to assist with:  Food Insecurity  Outreach Attempt:  2nd Attempt  A HIPAA compliant voice message was left requesting a return call.  Instructed patient to call back at [phone].  Bethannie Iglehart, AAS Paralegal, [facility]  [address], [place] [address] ??[email or web address]  ?? [phone]   [email or web address]

## 2021-10-12 ENCOUNTER — Telehealth: Payer: Self-pay

## 2021-10-12 NOTE — Telephone Encounter (Signed)
   Telephone encounter was:  Unsuccessful.  10/12/2021 Name: Kara Leach MRN: [id number] DOB: 1950-05-13  Unsuccessful outbound call made today to assist with:  Food Insecurity  Outreach Attempt:  3rd Attempt.  Referral closed unable to contact patient.  A HIPAA compliant voice message was left requesting a return call.  Instructed patient to call back at [phone].  Maris Abascal, AAS Paralegal, [facility]  [address], [place] [address] ??[email or web address]  ?? [phone]   [email or web address]

## 2021-11-10 ENCOUNTER — Ambulatory Visit: Payer: Self-pay

## 2021-11-10 NOTE — Patient Outreach (Signed)
  Care Coordination   11/10/2021 Name: Kara Leach MRN: [id number] DOB: 1950-05-02   Care Coordination Outreach Attempts:  An unsuccessful telephone outreach was attempted today to offer the patient information about available care coordination services as a benefit of their health plan.   Follow Up Plan:  Additional outreach attempts will be made to offer the patient care coordination information and services.   Encounter Outcome:  No Answer  Care Coordination Interventions Activated:  No   Care Coordination Interventions:  No, not indicated    Noreene Larsson RN, MSN, CCM Community Care Coordinator [facility] Mobile: [phone]

## 2021-11-23 DIAGNOSIS — I10 Essential (primary) hypertension: Secondary | ICD-10-CM | POA: Diagnosis not present

## 2021-11-23 DIAGNOSIS — N1831 Chronic kidney disease, stage 3a: Secondary | ICD-10-CM | POA: Diagnosis not present

## 2021-11-23 DIAGNOSIS — E1122 Type 2 diabetes mellitus with diabetic chronic kidney disease: Secondary | ICD-10-CM | POA: Diagnosis not present

## 2021-11-29 ENCOUNTER — Other Ambulatory Visit (INDEPENDENT_AMBULATORY_CARE_PROVIDER_SITE_OTHER): Payer: Self-pay | Admitting: Nurse Practitioner

## 2021-11-29 DIAGNOSIS — I83819 Varicose veins of unspecified lower extremities with pain: Secondary | ICD-10-CM

## 2021-12-04 ENCOUNTER — Ambulatory Visit (INDEPENDENT_AMBULATORY_CARE_PROVIDER_SITE_OTHER): Payer: Medicare HMO

## 2021-12-04 ENCOUNTER — Encounter (INDEPENDENT_AMBULATORY_CARE_PROVIDER_SITE_OTHER): Payer: Self-pay | Admitting: Vascular Surgery

## 2021-12-04 ENCOUNTER — Ambulatory Visit (INDEPENDENT_AMBULATORY_CARE_PROVIDER_SITE_OTHER): Payer: Medicare HMO | Admitting: Vascular Surgery

## 2021-12-04 VITALS — BP 153/91 | HR 90 | Resp 16 | Wt 154.6 lb

## 2021-12-04 DIAGNOSIS — I152 Hypertension secondary to endocrine disorders: Secondary | ICD-10-CM

## 2021-12-04 DIAGNOSIS — K219 Gastro-esophageal reflux disease without esophagitis: Secondary | ICD-10-CM | POA: Diagnosis not present

## 2021-12-04 DIAGNOSIS — E785 Hyperlipidemia, unspecified: Secondary | ICD-10-CM

## 2021-12-04 DIAGNOSIS — E1159 Type 2 diabetes mellitus with other circulatory complications: Secondary | ICD-10-CM | POA: Diagnosis not present

## 2021-12-04 DIAGNOSIS — I83819 Varicose veins of unspecified lower extremities with pain: Secondary | ICD-10-CM

## 2021-12-04 DIAGNOSIS — E1122 Type 2 diabetes mellitus with diabetic chronic kidney disease: Secondary | ICD-10-CM | POA: Diagnosis not present

## 2021-12-04 DIAGNOSIS — E1169 Type 2 diabetes mellitus with other specified complication: Secondary | ICD-10-CM

## 2021-12-04 DIAGNOSIS — Z794 Long term (current) use of insulin: Secondary | ICD-10-CM

## 2021-12-04 DIAGNOSIS — N1832 Chronic kidney disease, stage 3b: Secondary | ICD-10-CM | POA: Diagnosis not present

## 2021-12-09 NOTE — Patient Instructions (Incomplete)
Increase Levemir to 47 units and check sugar 2 hours after a meal -- if they run greater then 180, increase Novolog to 14 units.  Diabetes Mellitus and Standards of Medical Care Living with and managing diabetes (diabetes mellitus) can be complicated. Your diabetes treatment may be managed by a team of health care providers, including: A physician who specializes in diabetes (endocrinologist). You might also have visits with a nurse practitioner or physician assistant. Nurses. A registered dietitian. A certified diabetes care and education specialist. An exercise specialist. A pharmacist. An eye doctor. A foot specialist (podiatrist). A dental care provider. A primary care provider. A mental health care provider. How to manage your diabetes You can do many things to successfully manage your diabetes. Your health care providers will follow guidelines to help you get the best quality of care. Here are general guidelines for your diabetes management plan. Your health care providers may give you more specific instructions. Physical exams When you are diagnosed with diabetes, and each year after that, your health care provider will ask about your medical and family history. You will have a physical exam, which may include: Measuring your height, weight, and body mass index (BMI). Checking your blood pressure. This will be done at every routine medical visit. Your target blood pressure may vary depending on your medical conditions, your age, and other factors. A thyroid exam. A skin exam. Screening for nerve damage (peripheral neuropathy). This may include checking the pulse in your legs and feet and the level of sensation in your hands and feet. A foot exam to inspect the structure and skin of your feet, including checking for cuts, bruises, redness, blisters, sores, or other problems. Screening for blood vessel (vascular) problems. This may include checking the pulse in your legs and feet and  checking your temperature. Blood tests Depending on your treatment plan and your personal needs, you may have the following tests: Hemoglobin A1C (HbA1C). This test provides information about blood sugar (glucose) control over the previous 2-3 months. It is used to adjust your treatment plan, if needed. This test will be done: At least 2 times a year, if you are meeting your treatment goals. 4 times a year, if you are not meeting your treatment goals or if your goals have changed. Lipid testing, including total cholesterol, LDL and HDL cholesterol, and triglyceride levels. The goal for LDL is less than 100 mg/dL (5.5 mmol/L). If you are at high risk for complications, the goal is less than 70 mg/dL (3.9 mmol/L). The goal for HDL is 40 mg/dL (2.2 mmol/L) or higher for men, and 50 mg/dL (2.8 mmol/L) or higher for women. An HDL cholesterol of 60 mg/dL (3.3 mmol/L) or higher gives some protection against heart disease. The goal for triglycerides is less than 150 mg/dL (8.3 mmol/L). Liver function tests. Kidney function tests. Thyroid function tests.  Dental and eye exams  Visit your dentist two times a year. If you have type 1 diabetes, your health care provider may recommend an eye exam within 5 years after you are diagnosed, and then once a year after your first exam. For children with type 1 diabetes, the health care provider may recommend an eye exam when your child is age 28 or older and has had diabetes for 3-5 years. After the first exam, your child should get an eye exam once a year. If you have type 2 diabetes, your health care provider may recommend an eye exam as soon as you are diagnosed, and  then every 1-2 years after your first exam. Immunizations A yearly flu (influenza) vaccine is recommended annually for everyone 6 months or older. This is especially important if you have diabetes. The pneumonia (pneumococcal) vaccine is recommended for everyone 2 years or older who has diabetes.  If you are age 68 or older, you may get the pneumonia vaccine as a series of two separate shots. The hepatitis B vaccine is recommended for adults shortly after being diagnosed with diabetes. Adults and children with diabetes should receive all other vaccines according to age-specific recommendations from the Centers for Disease Control and Prevention (CDC). Mental and emotional health Screening for symptoms of eating disorders, anxiety, and depression is recommended at the time of diagnosis and after as needed. If your screening shows that you have symptoms, you may need more evaluation. You may work with a mental health care provider. Follow these instructions at home: Treatment plan You will monitor your blood glucose levels and may give yourself insulin. Your treatment plan will be reviewed at every medical visit. You and your health care provider will discuss: How you are taking your medicines, including insulin. Any side effects you have. Your blood glucose level target goals. How often you monitor your blood glucose level. Lifestyle habits, such as activity level and tobacco, alcohol, and substance use. Education Your health care provider will assess how well you are monitoring your blood glucose levels and whether you are taking your insulin and medicines correctly. He or she may refer you to: A certified diabetes care and education specialist to manage your diabetes throughout your life, starting at diagnosis. A registered dietitian who can create and review your personal nutrition plan. An exercise specialist who can discuss your activity level and exercise plan. General instructions Take over-the-counter and prescription medicines only as told by your health care provider. Keep all follow-up visits. This is important. Where to find support There are many diabetes support networks, including: American Diabetes Association (ADA): diabetes.org Defeat Diabetes Foundation:  defeatdiabetes.org Where to find more information American Diabetes Association (ADA): www.diabetes.org Association of Diabetes Care & Education Specialists (ADCES): diabeteseducator.org International Diabetes Federation (IDF): http://hill.biz/ Summary Managing diabetes (diabetes mellitus) can be complicated. Your diabetes treatment may be managed by a team of health care providers. Your health care providers follow guidelines to help you get the best quality care. You should have physical exams, blood tests, blood pressure monitoring, immunizations, and screening tests regularly. Stay updated on how to manage your diabetes. Your health care providers may also give you more specific instructions based on your individual health. This information is not intended to replace advice given to you by your health care provider. Make sure you discuss any questions you have with your health care provider. Document Revised: 09/10/2019 Document Reviewed: 09/10/2019 Elsevier Patient Education  2023 [employer].

## 2021-12-10 ENCOUNTER — Encounter (INDEPENDENT_AMBULATORY_CARE_PROVIDER_SITE_OTHER): Payer: Self-pay | Admitting: Vascular Surgery

## 2021-12-10 NOTE — Progress Notes (Signed)
MRN : [id number]  Kara Leach is a 71 y.o. ([birth year]) female who presents with chief complaint of varicose veins hurt.  History of Present Illness:   The patient is seen for evaluation of symptomatic right leg varicose veins. The patient relates burning and stinging which worsened steadily throughout the course of the day, particularly with standing. The patient also notes an aching and throbbing pain over the varicosities, particularly with prolonged dependent positions. The symptoms are significantly improved with elevation.  The patient also notes that during hot weather the symptoms are greatly intensified. The patient states the pain from the varicose veins interferes with work, daily exercise, shopping and household maintenance. At this point, the symptoms are persistent and severe enough that they're having a negative impact on lifestyle and are interfering with daily activities.  There is no history of DVT, PE or superficial thrombophlebitis. There is no history of ulceration or hemorrhage. The patient denies a significant family history of varicose veins.  The patient has worn graduated compression for several years on a daily basis but this is not helping.  At the present time the patient has been using over-the-counter analgesics for months as the symptoms have worsened. She has been exercising for many years as well.  There is no history of prior surgical intervention or sclerotherapy.  Venous duplex shows widely patent deep system and reflux in the right GSV throughout    Current Meds  Medication Sig   acetaminophen (TYLENOL) 650 MG CR tablet Take 650 mg by mouth every 8 (eight) hours as needed for pain.   allopurinol (ZYLOPRIM) 100 MG tablet Take 1 tablet (100 mg total) by mouth daily.   aspirin EC 81 MG tablet Take 81 mg by mouth daily.   BD PEN NEEDLE NANO U/F 32G X 4 MM MISC USE EVERY MORNING   Blood Glucose Monitoring Suppl (ONETOUCH VERIO REFLECT) w/Device  KIT USE TO CHECK BLOOD SUGAR DAILY   Calcium Carb-Cholecalciferol (CALCIUM 600 + D PO) Take 1 tablet by mouth 2 (two) times daily. 1000 IU VITAMIN D   dapagliflozin propanediol (FARXIGA) 10 MG TABS tablet Take 10 mg by mouth daily.   enalapril (VASOTEC) 2.5 MG tablet Take 1 tablet (2.5 mg total) by mouth daily.   fluticasone (FLONASE) 50 MCG/ACT nasal spray Place 2 sprays into both nostrils daily as needed for allergies.   glucose blood (ONETOUCH ULTRA) test strip USE TO CHECK BLOOD SUGAR TWICE DAILY   insulin detemir (LEVEMIR) 100 UNIT/ML FlexPen Inject 50 Units into the skin at bedtime. (Patient taking differently: Inject 44 Units into the skin at bedtime.)   Insulin Pen Needle (B-D ULTRAFINE III SHORT PEN) 31G X 8 MM MISC 1 each by Does not apply route 3 (three) times daily.   Lancets (ONETOUCH DELICA PLUS [id number]) MISC Use to check blood sugars 3 to 4 times a day.   liraglutide (VICTOZA) 18 MG/3ML SOPN ADMINISTER 1.2MG UNDER THE SKIN DAILY   Loratadine (CLARITIN PO) Take 10 mg by mouth as needed.   MELATONIN PO Take by mouth as needed.   metoprolol succinate (TOPROL-XL) 25 MG 24 hr tablet Take 1 tablet (25 mg total) by mouth daily.   NOVOLOG FLEXPEN 100 UNIT/ML FlexPen INJECT 12-14 UNITS UNDER THE SKIN 3 TIMES DAILY WITH MEALS   sertraline (ZOLOFT) 100 MG tablet Take 1 tablet (100 mg total) by mouth every morning.   simvastatin (ZOCOR) 40 MG tablet TAKE 1 TABLET BY MOUTH EVERYDAY AT BEDTIME  Specialty Vitamins Products (VITA-RX DIABETIC VITAMIN) CAPS Take 1 packet by mouth daily. Diabetic Vitamins - 6 or 7 in 1 pack    Past Medical History:  Diagnosis Date   Allergy    Anxiety    Arthritis    Osteoarthritis   Cancer ([facility]) 05/2018   possibly on left breast but not sure   Chronic kidney disease    Depression    Diabetes mellitus without complication ([facility] [place]) 3762   Diabetic neuropathy ([place])    feet   Diverticulitis 2017   GERD (gastroesophageal reflux disease)    Headache     sinus and migraines on occasion   Hyperlipidemia    Hypertension    Last menstrual period (LMP) > 10 days ago 1986   Obesity    Peripheral vascular disease (HCC)    neuropathy in hands and feet d/t diabetes   Renal insufficiency    Stage 2 kidney disease   Seizures ([facility])    due to high blood sugars. last time = 09/2014   Sleep apnea    has CPAP, can't tolerate   Stroke ([place]) 1980   minor. left side remains weaker than right   Wears hearing aid    right side   Wrist weakness    left - after fracture and repair    Past Surgical History:  Procedure Laterality Date   ABDOMINAL HYSTERECTOMY  1986   APPENDECTOMY  1964   BREAST BIOPSY Left 07/03/2016   benign, fibrocystic changes with calcifications, usual ductal hyperplasia.    BREAST BIOPSY Left 04/23/2018   radial scar   BREAST BIOPSY Left 08/13/2018   Procedure: WIDE LOCALIZATION AND OPEN BIOPSY OF LEFT BREAST, DIABETIC;  Surgeon: Robert Bellow, MD;  Location: [facility];  Service: General;  Laterality: Left;   BREAST EXCISIONAL BIOPSY Left 08/13/2018   CHOLECYSTECTOMY  2012   COLONOSCOPY WITH PROPOFOL N/A 12/01/2015   Procedure: COLONOSCOPY WITH PROPOFOL;  Surgeon: [name], MD;  Location: [facility];  Service: Endoscopy;  Laterality: N/A;  Diabetic - insulin Sleep apnea   PATELLA FRACTURE SURGERY Left    1. had to recenter kneecap, 2 and 3 surgeries were arthroscopies   WRIST FRACTURE SURGERY Left 2005   Steel plate    Social History Social History   Tobacco Use   Smoking status: Never   Smokeless tobacco: Never  Vaping Use   Vaping Use: Never used  Substance Use Topics   Alcohol use: Not Currently    Alcohol/week: 0.0 standard drinks of alcohol    Comment: occasionally holiday, weddings   Drug use: No    Family History Family History  Problem Relation Age of Onset   Diabetes Mother    Heart disease Mother    Hypertension Mother    Cancer Father        stomach   Diabetes Sister    Arthritis  Sister    Stroke Sister    Hypertension Sister    Breast cancer Maternal Aunt 96       great aunt   Parkinson's disease Maternal Grandmother    Tremor Maternal Grandmother     Allergies  Allergen Reactions   Strawberry Extract Swelling    Lips swell and rash/whelps over body Difficulty breathing   Ibuprofen Other (See Comments)    "ringing in ears", dizziness Also, Advil / Aleve / Motrin   Cat Hair Extract Other (See Comments)    Sneezing     REVIEW OF SYSTEMS (Negative unless checked)  Constitutional: [] Weight loss  [] Fever  [] Chills Cardiac: [] Chest pain   [] Chest pressure   [] Palpitations   [] Shortness of breath when laying flat   [] Shortness of breath with exertion. Vascular:  [] Pain in legs with walking   [x] Pain in legs with standing  [] History of DVT   [] Phlebitis   [] Swelling in legs   [x] Varicose veins   [] Non-healing ulcers Pulmonary:   [] Uses home oxygen   [] Productive cough   [] Hemoptysis   [] Wheeze  [] COPD   [] Asthma Neurologic:  [] Dizziness   [] Seizures   [] History of stroke   [] History of TIA  [] Aphasia   [] Vissual changes   [] Weakness or numbness in arm   [] Weakness or numbness in leg Musculoskeletal:   [] Joint swelling   [] Joint pain   [] Low back pain Hematologic:  [] Easy bruising  [] Easy bleeding   [] Hypercoagulable state   [] Anemic Gastrointestinal:  [] Diarrhea   [] Vomiting  [x] Gastroesophageal reflux/heartburn   [] Difficulty swallowing. Genitourinary:  [] Chronic kidney disease   [] Difficult urination  [] Frequent urination   [] Blood in urine Skin:  [] Rashes   [] Ulcers  Psychological:  [] History of anxiety   []  History of major depression.  Physical Examination  Vitals:   12/04/21 1506  BP: (!) 153/91  Pulse: 90  Resp: 16  Weight: 154 lb 9.6 oz (70.1 kg)   Body mass index is 32.31 kg/m. Gen: WD/WN, NAD Head: [place]/AT, No temporalis wasting.  Ear/Nose/Throat: Hearing grossly intact, nares w/o erythema or drainage, pinna without lesions Eyes: PER, EOMI,  sclera nonicteric.  Neck: Supple, no gross masses.  No JVD.  Pulmonary:  Good air movement, no audible wheezing, no use of accessory muscles.  Cardiac: RRR, precordium not hyperdynamic. Vascular:  Large varicosities present, greater than 10 mm right leg.  Veins are tender to palpation  Moderate venous stasis changes to the legs bilaterally.  Trace soft pitting edema  Vessel Right Left  Radial Palpable Palpable  Gastrointestinal: soft, non-distended. No guarding/no peritoneal signs.  Musculoskeletal: M/S 5/5 throughout.  No deformity.  Neurologic: CN 2-12 intact. Pain and light touch intact in extremities.  Symmetrical.  Speech is fluent. Motor exam as listed above. Psychiatric: Judgment intact, Mood & affect appropriate for pt's clinical situation. Dermatologic: Venous rashes no ulcers noted.  No changes consistent with cellulitis. Lymph : No lichenification or skin changes of chronic lymphedema.  CBC Lab Results  Component Value Date   WBC 9.6 02/27/2021   HGB 13.2 02/27/2021   HCT 40.3 02/27/2021   MCV 94 02/27/2021   PLT 246 02/27/2021    BMET    Component Value Date/Time   NA 138 02/27/2021 1003   NA 129 (L) 06/15/2014 1054   K 4.7 02/27/2021 1003   K 4.6 06/15/2014 1054   CL 100 02/27/2021 1003   CL 100 (L) 06/15/2014 1054   CO2 24 02/27/2021 1003   CO2 24 06/15/2014 1054   GLUCOSE 126 (H) 02/27/2021 1003   GLUCOSE 68 (L) 01/19/2021 1338   GLUCOSE 405 (H) 06/15/2014 1054   BUN 18 02/27/2021 1003   BUN 23 (H) 06/15/2014 1054   CREATININE 1.25 (H) 02/27/2021 1003   CREATININE 1.07 (H) 06/15/2014 1054   CALCIUM 9.4 02/27/2021 1003   CALCIUM 9.1 06/15/2014 1054   GFRNONAA 56 (L) 01/19/2021 1338   GFRNONAA 56 (L) 06/15/2014 1054   GFRAA 49 (L) 02/25/2020 0938   GFRAA >60 06/15/2014 1054   CrCl cannot be calculated (Patient's most recent lab result is older than the maximum 21 days  allowed.).  COAG No results found for: "INR", "PROTIME"  Radiology No results  found.   Assessment/Plan 1. Pain due to varicose veins of lower extremity Recommend  I have reviewed my previous  discussion with the patient regarding  varicose veins and why they cause symptoms. Patient will continue  wearing graduated compression stockings class 1 on a daily basis, beginning first thing in the morning and removing them in the evening.    In addition, behavioral modification including elevation during the day was again discussed and this will continue.  The patient has utilized over the counter pain medications and has been exercising.  However, at this time conservative therapy has not alleviated the patient's symptoms of leg pain and swelling  Recommend: laser ablation of the right great saphenous vein to eliminate the symptoms of pain and swelling of the lower extremities caused by the severe superficial venous reflux disease.   - VAS [place] LOWER EXTREMITY VENOUS REFLUX  2. Hypertension associated with diabetes ([place]) Continue antihypertensive medications as already ordered, these medications have been reviewed and there are no changes at this time.   3. Gastroesophageal reflux disease without esophagitis Continue PPI as already ordered, this medication has been reviewed and there are no changes at this time.  Avoidence of caffeine and alcohol  Moderate elevation of the head of the bed    4. Hyperlipidemia associated with type 2 diabetes mellitus ([employer]) Continue statin as ordered and reviewed, no changes at this time   5. Type 2 diabetes mellitus with stage 3b chronic kidney disease, with long-term current use of insulin (HCC) Continue hypoglycemic medications as already ordered, these medications have been reviewed and there are no changes at this time.  Hgb A1C to be monitored as already arranged by primary service       Hortencia Pilar, MD  12/10/2021 5:02 PM

## 2021-12-13 ENCOUNTER — Encounter: Payer: Self-pay | Admitting: Nurse Practitioner

## 2021-12-13 ENCOUNTER — Ambulatory Visit (INDEPENDENT_AMBULATORY_CARE_PROVIDER_SITE_OTHER): Payer: Medicare HMO | Admitting: Nurse Practitioner

## 2021-12-13 VITALS — BP 109/83 | HR 73 | Temp 98.0°F | Ht <= 58 in | Wt 154.5 lb

## 2021-12-13 DIAGNOSIS — E1159 Type 2 diabetes mellitus with other circulatory complications: Secondary | ICD-10-CM | POA: Diagnosis not present

## 2021-12-13 DIAGNOSIS — E785 Hyperlipidemia, unspecified: Secondary | ICD-10-CM | POA: Diagnosis not present

## 2021-12-13 DIAGNOSIS — E1169 Type 2 diabetes mellitus with other specified complication: Secondary | ICD-10-CM | POA: Diagnosis not present

## 2021-12-13 DIAGNOSIS — N1832 Chronic kidney disease, stage 3b: Secondary | ICD-10-CM

## 2021-12-13 DIAGNOSIS — E1122 Type 2 diabetes mellitus with diabetic chronic kidney disease: Secondary | ICD-10-CM | POA: Diagnosis not present

## 2021-12-13 DIAGNOSIS — J0110 Acute frontal sinusitis, unspecified: Secondary | ICD-10-CM | POA: Insufficient documentation

## 2021-12-13 DIAGNOSIS — Z794 Long term (current) use of insulin: Secondary | ICD-10-CM

## 2021-12-13 DIAGNOSIS — F324 Major depressive disorder, single episode, in partial remission: Secondary | ICD-10-CM

## 2021-12-13 DIAGNOSIS — R69 Illness, unspecified: Secondary | ICD-10-CM | POA: Diagnosis not present

## 2021-12-13 DIAGNOSIS — N183 Chronic kidney disease, stage 3 unspecified: Secondary | ICD-10-CM

## 2021-12-13 DIAGNOSIS — D472 Monoclonal gammopathy: Secondary | ICD-10-CM | POA: Diagnosis not present

## 2021-12-13 DIAGNOSIS — Z6831 Body mass index (BMI) 31.0-31.9, adult: Secondary | ICD-10-CM | POA: Diagnosis not present

## 2021-12-13 DIAGNOSIS — E6609 Other obesity due to excess calories: Secondary | ICD-10-CM | POA: Diagnosis not present

## 2021-12-13 DIAGNOSIS — I152 Hypertension secondary to endocrine disorders: Secondary | ICD-10-CM | POA: Diagnosis not present

## 2021-12-13 LAB — BAYER DCA HB A1C WAIVED: HB A1C (BAYER DCA - WAIVED): 7.2 % — ABNORMAL HIGH (ref 4.8–5.6)

## 2021-12-13 MED ORDER — AMOXICILLIN-POT CLAVULANATE 875-125 MG PO TABS: 1.0000 | ORAL_TABLET | Freq: Two times a day (BID) | ORAL | 0 refills | Status: AC

## 2021-12-13 NOTE — Assessment & Plan Note (Signed)
Acute and ongoing for over one week.  Will Covid test today, although suspect this will be negative.  Start Augmentin for symptoms over one week, suspect more bacterial.  Recommend: - Increased rest - Increasing Fluids - Acetaminophen / ibuprofen as needed for fever/pain.  - Salt water gargling, chloraseptic spray and throat lozenges  - Mucinex.  - Humidifying the air. Return to office as needed for worsening or ongoing.

## 2021-12-13 NOTE — Assessment & Plan Note (Signed)
Chronic, stable.  BP at goal home and office.  At this time will continue current medication regimen and adjust as needed.  Alert provider if BP consistently >130/80 at home and then may restart Amlodipine at 2.5 MG.  Continue Metoprolol and Enalapril and adjust as needed.  Focus on DASH diet.  Enalapril for kidney protection.  LABS: up to date with nephrology. Return in 3 months.

## 2021-12-13 NOTE — Assessment & Plan Note (Signed)
Chronic, ongoing.  Continue current medication regimen and adjust as needed. Lipid panel today. 

## 2021-12-13 NOTE — Assessment & Plan Note (Signed)
Chronic, stable.  Denies SI/HI.  Continue current medication regimen, Sertraline 100 MG daily, and adjust as needed. Recommend meditation and relaxation techniques at home. 

## 2021-12-13 NOTE — Assessment & Plan Note (Signed)
Chronic, ongoing.  A1c 7.2% today, remaining similar to repvious.  Continue Enalapril for kidney protection and continue to renal dose medications as needed.  Continue to collaborate with nephrology -- recent labs and note reviewed.

## 2021-12-13 NOTE — Assessment & Plan Note (Signed)
Chronic, ongoing with A1c 7.2% today, similar to previous, and urine ALB 10 (March 2023).  At this time recommend she check sugars 2 hours after a meal as well to see if running higher at that time.  Increase Levemir to 47 units and if sugars after meals are >180 increase Novolog to 14 units.  Goal in long run would be to reduce insulin, but at this time she is not a goal.  Eye and foot exam up to date.  Return in 3 months.

## 2021-12-13 NOTE — Progress Notes (Signed)
BP 109/83   Pulse 73   Temp 98 F (36.7 C) (Oral)   Ht _0  (1.473 m)   Wt 154 lb 8 oz (70.1 kg)   LMP  (LMP Unknown)   SpO2 96%   BMI 32.29 kg/m    Subjective:    Patient ID: Kara Leach, female    DOB: 06-Apr-1950, 71 y.o.   MRN: [id number]  HPI: Kara Leach is a 71 y.o. female  Chief Complaint  Patient presents with   Diabetes   Hyperlipidemia   Hypertension   Depression   Allergic Rhinitis     Patient says she would like for the provider to check her out as she having issues with her allergies. Patient says she is experiencing a runny nose, drainage down her throat and then sneezing. Patient says she has been taking over the counter Claritin.    Immunizations    Patient says she is scheduled for later in October for an appointment for her Flu and Covid Booster vaccine.    DIABETES A1c 7.2% June.  Continues on Novolog pre meal and Levemir + Victoza 1.2 MG daily & Farxiga 10 MG daily.  Reports diet has been pretty good at home. Hypoglycemic episodes:no Polydipsia/polyuria: no Visual disturbance: no Chest pain: no Paresthesias: no Glucose Monitoring: yes  Accucheck frequency: BID  Fasting glucose: 114 this morning -- averages 110-123  Post prandial:  Evening: average 150-160  Before meals: Taking Insulin?: yes  Long acting insulin: Levemir 44 units  Short acting insulin: Novolog 12 units in evening and 12 in morning and lunch Blood Pressure Monitoring: daily Retinal Examination: Up To Date Foot Exam: Up to Date Pneumovax: Up to Date Influenza: Up to Date Aspirin: yes   HYPERTENSION / HYPERLIPIDEMIA/TREMORS Continues on Enalapril, Metoprolol, and ASA + Simvastatin.   Satisfied with current treatment? yes Duration of hypertension: chronic BP monitoring frequency: twice a day BP range: <130/80 range at home BP medication side effects: no Duration of hyperlipidemia: chronic Cholesterol medication side effects: no Cholesterol supplements:  none Medication compliance: good compliance Aspirin: yes Recent stressors: no Recurrent headaches: no Visual changes: no Palpitations: no Dyspnea: no Chest pain: no Lower extremity edema: occasional -- having laser surgery in December on varicose veins + injections = Dr. Earley Brooke Dizzy/lightheaded: rarely, with standing up too quickly  CHRONIC KIDNEY DISEASE With eGFR ranging from 44-56 over past three years.  Saw nephrology on 11/23/21 and labs with CRT 1.19 and eGFR 50, PTH 57.   CKD status: stable Medications renally dose: yes Previous renal evaluation: yes Pneumovax:  Up to Date Influenza Vaccine:  Up to Date   UPPER RESPIRATORY TRACT INFECTION Started with symptoms about one week ago -- with increased pollen and rain.  Has been using her Flonase and Claritin at times.   Fever: no Cough:  occasional Shortness of breath: no Wheezing: no Chest pain: no Chest tightness: no Chest congestion: no Nasal congestion: yes Runny nose: yes Post nasal drip: yes Sneezing: yes Sore throat: yes Swollen glands: no Sinus pressure: yes Headache: yes Face pain: no Toothache: no Ear pain: yes left Ear pressure: yes left Eyes red/itching:no Eye drainage/crusting: no  Vomiting: no Rash: no Fatigue: yes Sick contacts: no Strep contacts: no  Context: stable Recurrent sinusitis: no Relief with OTC cold/cough medications: no  Treatments attempted: Flonase and Claritin    MGUS: Last saw oncology on 01/26/21 - returns to see them in October.  Saw Dr. Bary Castilla on 05/05/20 for follow-up on breasts --  last mammogram 06/09/21, had repeat breast biopsy on 06/09/21 which was stable.  DEPRESSION Continues on Sertraline 100 MG daily.  Mood status: stable Satisfied with current treatment?: yes Symptom severity: mild  Duration of current treatment : chronic Side effects: no Medication compliance: good compliance Psychotherapy/counseling: no Depressed mood: occasional Anxious mood:  occasional Anhedonia: no Significant weight loss or gain: no Insomnia: occasional, hard to stay asleep Fatigue: no Feelings of worthlessness or guilt: no Impaired concentration/indecisiveness: no Suicidal ideations: no Hopelessness: no Crying spells: no    12/13/2021    9:30 AM 10/04/2021   11:15 AM 09/12/2021    9:54 AM 05/29/2021    9:48 AM 02/27/2021   10:10 AM  Depression screen PHQ 2/9  Decreased Interest 1 0 _0 Down, Depressed, Hopeless _1 PHQ - 2 Score _2 Altered sleeping _3 Tired, decreased energy _4 Change in appetite 0 1 0 0 0  Feeling bad or failure about yourself  2 0 _5 Trouble concentrating 0 0 0 0 0  Moving slowly or fidgety/restless 0 0 0 1 0  Suicidal thoughts 0 0 0 0 0  PHQ-9 Score _6 Difficult doing work/chores Somewhat difficult Not difficult at all Somewhat difficult  Somewhat difficult      12/13/2021    9:31 AM 09/12/2021    9:55 AM 05/29/2021    9:49 AM 02/27/2021   10:13 AM  GAD 7 : Generalized Anxiety Score  Nervous, Anxious, on Edge 0 0 2 0  Control/stop worrying _7 0  Worry too much - different things _8 0  Trouble relaxing 0 1 2 0  Restless 0 0 0 0  Easily annoyed or irritable 2 0 0 0  Afraid - awful might happen _9 0  Total GAD 7 Score _10 0  Anxiety Difficulty Somewhat difficult Somewhat difficult Somewhat difficult Not difficult at all    Relevant past medical, surgical, family and social history reviewed and updated as indicated. Interim medical history since our last visit reviewed. Allergies and medications reviewed and updated.  Review of Systems  Constitutional:  Negative for activity change, appetite change, diaphoresis, fatigue and fever.  HENT:  Positive for congestion, postnasal drip, rhinorrhea, sinus pressure and sore throat. Negative for ear discharge, ear pain, sinus pain and voice change.   Respiratory:  Positive for cough. Negative for chest tightness and shortness  of breath.   Cardiovascular:  Negative for chest pain, palpitations and leg swelling.  Gastrointestinal: Negative.   Endocrine: Negative for cold intolerance, heat intolerance, polydipsia, polyphagia and polyuria.  Neurological:  Positive for headaches.  Psychiatric/Behavioral: Negative.      Per HPI unless specifically indicated above     Objective:    BP 109/83   Pulse 73   Temp 98 F (36.7 C) (Oral)   Ht _11  (1.473 m)   Wt 154 lb 8 oz (70.1 kg)   LMP  (LMP Unknown)   SpO2 96%   BMI 32.29 kg/m   Wt Readings from Last 3 Encounters:  12/13/21 154 lb 8 oz (70.1 kg)  12/04/21 154 lb 9.6 oz (70.1 kg)  10/03/21 155 lb 12.8 oz (70.7 kg)    Physical Exam Vitals and nursing note reviewed.  Constitutional:      General: She is awake. She is not  in acute distress.    Appearance: She is well-developed and well-groomed. She is obese. She is not ill-appearing.  HENT:     Head: Normocephalic.     Right Ear: Hearing, ear canal and external ear normal. A middle ear effusion is present.     Left Ear: Hearing, ear canal and external ear normal. A middle ear effusion is present.     Nose: Rhinorrhea present. Rhinorrhea is clear.     Right Sinus: Frontal sinus tenderness present. No maxillary sinus tenderness.     Left Sinus: Frontal sinus tenderness present. No maxillary sinus tenderness.     Mouth/Throat:     Mouth: Mucous membranes are moist.     Pharynx: Posterior oropharyngeal erythema (mild with cobblestone appearance) present. No pharyngeal swelling or oropharyngeal exudate.  Eyes:     General: Lids are normal.        Right eye: No discharge.        Left eye: No discharge.     Conjunctiva/sclera: Conjunctivae normal.     Pupils: Pupils are equal, round, and reactive to light.  Neck:     Thyroid: No thyromegaly.     Vascular: No carotid bruit.  Cardiovascular:     Rate and Rhythm: Normal rate and regular rhythm.     Pulses:          Dorsalis pedis pulses are 2+ on the  right side and 2+ on the left side.       Posterior tibial pulses are 2+ on the right side and 2+ on the left side.     Heart sounds: Normal heart sounds. No murmur heard.    No gallop.  Pulmonary:     Effort: Pulmonary effort is normal. No accessory muscle usage or respiratory distress.     Breath sounds: Normal breath sounds.  Abdominal:     General: Bowel sounds are normal.     Palpations: Abdomen is soft.     Tenderness: There is no abdominal tenderness.  Musculoskeletal:     Cervical back: Normal range of motion and neck supple.     Right lower leg: No edema.     Left lower leg: No edema.  Lymphadenopathy:     Cervical: No cervical adenopathy.  Skin:    General: Skin is warm and dry.  Neurological:     Mental Status: She is alert and oriented to person, place, and time.  Psychiatric:        Attention and Perception: Attention normal.        Mood and Affect: Mood normal.        Speech: Speech normal.        Behavior: Behavior normal. Behavior is cooperative.        Thought Content: Thought content normal.    Results for orders placed or performed in visit on 12/13/21  Bayer DCA Hb A1c Waived  Result Value Ref Range   HB A1C (BAYER DCA - WAIVED) 7.2 (H) 4.8 - 5.6 %      Assessment & Plan:   Problem List Items Addressed This Visit       Cardiovascular and Mediastinum   Hypertension associated with diabetes ([place]) (Chronic)    Chronic, stable.  BP at goal home and office.  At this time will continue current medication regimen and adjust as needed.  Alert provider if BP consistently >130/80 at home and then may restart Amlodipine at 2.5 MG.  Continue Metoprolol and Enalapril and adjust as needed.  Focus  on DASH diet.  Enalapril for kidney protection.  LABS: up to date with nephrology. Return in 3 months.      Relevant Orders   Bayer DCA Hb A1c Waived (Completed)     Respiratory   Acute non-recurrent frontal sinusitis    Acute and ongoing for over one week.  Will Covid  test today, although suspect this will be negative.  Start Augmentin for symptoms over one week, suspect more bacterial.  Recommend: - Increased rest - Increasing Fluids - Acetaminophen / ibuprofen as needed for fever/pain.  - Salt water gargling, chloraseptic spray and throat lozenges  - Mucinex.  - Humidifying the air. Return to office as needed for worsening or ongoing.      Relevant Medications   amoxicillin-clavulanate (AUGMENTIN) 875-125 MG tablet   Other Relevant Orders   Novel Coronavirus, NAA ([facility])     Endocrine   CKD stage 3 due to type 2 diabetes mellitus (HCC) (Chronic)    Chronic, ongoing.  A1c 7.2% today, remaining similar to repvious.  Continue Enalapril for kidney protection and continue to renal dose medications as needed.  Continue to collaborate with nephrology -- recent labs and note reviewed.      Relevant Orders   Bayer DCA Hb A1c Waived (Completed)   Hyperlipidemia associated with type 2 diabetes mellitus (HCC) (Chronic)    Chronic, ongoing.  Continue current medication regimen and adjust as needed.  Lipid panel today.      Relevant Orders   Bayer DCA Hb A1c Waived (Completed)   Lipid Panel w/o Chol/HDL Ratio   Type 2 diabetes mellitus with stage 3b chronic kidney disease, with long-term current use of insulin (HCC) - Primary (Chronic)    Chronic, ongoing with A1c 7.2% today, similar to previous, and urine ALB 10 (March 2023).  At this time recommend she check sugars 2 hours after a meal as well to see if running higher at that time.  Increase Levemir to 47 units and if sugars after meals are >180 increase Novolog to 14 units.  Goal in long run would be to reduce insulin, but at this time she is not a goal.  Eye and foot exam up to date.  Return in 3 months.      Relevant Orders   Bayer DCA Hb A1c Waived (Completed)     Other   MGUS (monoclonal gammopathy of unknown significance) (Chronic)    Chronic, stable.  Continue collaboration with Dr. Grayland Ormond  at [facility] - recent notes and labs reviewed.      Obesity (Chronic)    BMI 32.29.  Recommended eating smaller high protein, low fat meals more frequently and exercising 30 mins a day 5 times a week with a goal of 10-15lb weight loss in the next 3 months. Patient voiced their understanding and motivation to adhere to these recommendations.       Depression, major, single episode, in partial remission (HCC)    Chronic, stable.  Denies SI/HI.  Continue current medication regimen, Sertraline 100 MG daily, and adjust as needed. Recommend meditation and relaxation techniques at home.        Follow up plan: Return in about 3 months (around 03/14/2022) for Annual physical and diabetes check.

## 2021-12-13 NOTE — Assessment & Plan Note (Signed)
Chronic, stable.  Continue collaboration with Dr. Finnegan at [facility] - recent notes and labs reviewed. 

## 2021-12-13 NOTE — Assessment & Plan Note (Signed)
BMI 32.29.  Recommended eating smaller high protein, low fat meals more frequently and exercising 30 mins a day 5 times a week with a goal of 10-15lb weight loss in the next 3 months. Patient voiced their understanding and motivation to adhere to these recommendations.

## 2021-12-14 LAB — LIPID PANEL W/O CHOL/HDL RATIO
Cholesterol, Total: 150 mg/dL (ref 100–199)
HDL: 39 mg/dL — ABNORMAL LOW (ref >39)
LDL Chol Calc (NIH): 61 mg/dL (ref 0–99)
Triglycerides: 322 mg/dL — ABNORMAL HIGH (ref 0–149)
VLDL Cholesterol Cal: 50 mg/dL — ABNORMAL HIGH (ref 5–40)

## 2021-12-14 LAB — NOVEL CORONAVIRUS, NAA: SARS-CoV-2, NAA: NOT DETECTED

## 2021-12-14 NOTE — Progress Notes (Signed)
Please let Kara Leach know her Covid testing is negative -- great news!!

## 2021-12-14 NOTE — Progress Notes (Signed)
Good morning, please let Kara Leach know her labs have returned and LDL (bad cholesterol) is in good range -- triglycerides a little elevated -- I recommend she continue Simvastatin for now and ensure she is taking some fish oil daily (or Omega 3 gummies).  Any questions? Keep being amazing!!  Thank you for allowing me to participate in your care.  I appreciate you. Kindest regards, Henrine Screws'

## 2021-12-15 ENCOUNTER — Ambulatory Visit: Payer: Self-pay

## 2021-12-15 NOTE — Patient Instructions (Signed)
Visit Information  Thank you for taking time to visit with me today. Please don't hesitate to contact me if I can be of assistance to you.   Following are the goals we discussed today:   Goals Addressed             This Visit's Progress    COMPLETED: RNCM: Effective Management of Sinus Infection       Care Coordination Interventions: Evaluation of current treatment plan related to sinus infection and patient's adherence to plan as established by provider Advised patient to call the office for changes in conditions, questions, or concerns Provided education to patient re: monitoring for sx and sx of infection, changes in symptoms, new concerns or questions Reviewed medications with patient and discussed compliance and the patient taking all of her abx as prescribed  Reviewed scheduled/upcoming provider appointments including 03-14-2022  Discussed plans with patient for ongoing care management follow up and provided patient with direct contact information for care management team Advised patient to discuss health and well being and changes in her chronic conditions with provider Screening for signs and symptoms of depression related to chronic disease state  Assessed social determinant of health barriers Review of the care coordination program and the role of the RNCM. Education provided. The patient states she is doing well and feeling better since seeing Jolene. Knows to call the office for new concerns or questions.              Please call the care guide team at [phone] if you need to schedule an appointment.   If you are experiencing a Mental Health or [place] or need someone to talk to, please call the Suicide and Crisis Lifeline: 988 call the [place] National Suicide Prevention Lifeline: 8192285853 or TTY: 838-205-5423 TTY 920-554-8245) to talk to a trained counselor call 1-800-273-TALK (toll free, 24 hour hotline)  The patient verbalized  understanding of instructions, educational materials, and care plan provided today and DECLINED offer to receive copy of patient instructions, educational materials, and care plan.   No further follow up required: The patient knows to call for changes or new concerns.  Noreene Larsson RN, MSN, [facility]  Mobile: [phone]

## 2021-12-15 NOTE — Patient Outreach (Signed)
  Care Coordination   Initial Visit Note   12/15/2021 Name: Kara Leach MRN: [id number] DOB: 11-29-1950  Kara Leach is a 71 y.o. year old female who sees [place], Henrine Screws T, NP for primary care. I spoke with  [name] by phone today.  What matters to the patients health and wellness today?  Getting over the sinus infection she currently has    Goals Addressed             This Visit's Progress    COMPLETED: RNCM: Effective Management of Sinus Infection       Care Coordination Interventions: Evaluation of current treatment plan related to sinus infection and patient's adherence to plan as established by provider Advised patient to call the office for changes in conditions, questions, or concerns Provided education to patient re: monitoring for sx and sx of infection, changes in symptoms, new concerns or questions Reviewed medications with patient and discussed compliance and the patient taking all of her abx as prescribed  Reviewed scheduled/upcoming provider appointments including 03-14-2022  Discussed plans with patient for ongoing care management follow up and provided patient with direct contact information for care management team Advised patient to discuss health and well being and changes in her chronic conditions with provider Screening for signs and symptoms of depression related to chronic disease state  Assessed social determinant of health barriers Review of the care coordination program and the role of the RNCM. Education provided. The patient states she is doing well and feeling better since seeing Jolene. Knows to call the office for new concerns or questions.            SDOH assessments and interventions completed:  Yes  SDOH Interventions Today    Flowsheet Row Most Recent Value  SDOH Interventions   Housing Interventions Intervention Not Indicated  Utilities Interventions Intervention Not Indicated        Care Coordination Interventions  Activated:  Yes  Care Coordination Interventions:  Yes, provided   Follow up plan: No further intervention required.   Encounter Outcome:  Pt. Visit Completed   Noreene Larsson RN, MSN, [place]  Mobile: [phone]

## 2022-01-18 ENCOUNTER — Inpatient Hospital Stay: Payer: Medicare HMO | Attending: Oncology

## 2022-01-18 ENCOUNTER — Other Ambulatory Visit: Payer: Self-pay

## 2022-01-18 DIAGNOSIS — Z794 Long term (current) use of insulin: Secondary | ICD-10-CM | POA: Insufficient documentation

## 2022-01-18 DIAGNOSIS — N189 Chronic kidney disease, unspecified: Secondary | ICD-10-CM | POA: Insufficient documentation

## 2022-01-18 DIAGNOSIS — E1142 Type 2 diabetes mellitus with diabetic polyneuropathy: Secondary | ICD-10-CM | POA: Insufficient documentation

## 2022-01-18 DIAGNOSIS — D472 Monoclonal gammopathy: Secondary | ICD-10-CM | POA: Diagnosis not present

## 2022-01-18 DIAGNOSIS — Z7982 Long term (current) use of aspirin: Secondary | ICD-10-CM | POA: Insufficient documentation

## 2022-01-18 DIAGNOSIS — E1165 Type 2 diabetes mellitus with hyperglycemia: Secondary | ICD-10-CM | POA: Insufficient documentation

## 2022-01-18 DIAGNOSIS — Z79899 Other long term (current) drug therapy: Secondary | ICD-10-CM | POA: Insufficient documentation

## 2022-01-18 LAB — CBC WITH DIFFERENTIAL/PLATELET
Abs Immature Granulocytes: 0.03 10*3/uL (ref 0.00–0.07)
Basophils Absolute: 0.1 10*3/uL (ref 0.0–0.1)
Basophils Relative: 1 %
Eosinophils Absolute: 0.2 10*3/uL (ref 0.0–0.5)
Eosinophils Relative: 3 %
HCT: 41.1 % (ref 36.0–46.0)
Hemoglobin: 13.5 g/dL (ref 12.0–15.0)
Immature Granulocytes: 0 %
Lymphocytes Relative: 48 %
Lymphs Abs: 3.4 10*3/uL (ref 0.7–4.0)
MCH: 30.4 pg (ref 26.0–34.0)
MCHC: 32.8 g/dL (ref 30.0–36.0)
MCV: 92.6 fL (ref 80.0–100.0)
Monocytes Absolute: 0.5 10*3/uL (ref 0.1–1.0)
Monocytes Relative: 7 %
Neutro Abs: 2.9 10*3/uL (ref 1.7–7.7)
Neutrophils Relative %: 41 %
Platelets: 261 10*3/uL (ref 150–400)
RBC: 4.44 MIL/uL (ref 3.87–5.11)
RDW: 14.2 % (ref 11.5–15.5)
WBC: 7.2 10*3/uL (ref 4.0–10.5)
nRBC: 0 % (ref 0.0–0.2)

## 2022-01-18 LAB — BASIC METABOLIC PANEL
Anion gap: 9 (ref 5–15)
BUN: 18 mg/dL (ref 8–23)
CO2: 24 mmol/L (ref 22–32)
Calcium: 9.8 mg/dL (ref 8.9–10.3)
Chloride: 105 mmol/L (ref 98–111)
Creatinine, Ser: 1.1 mg/dL — ABNORMAL HIGH (ref 0.44–1.00)
GFR, Estimated: 54 mL/min — ABNORMAL LOW (ref >60)
Glucose, Bld: 135 mg/dL — ABNORMAL HIGH (ref 70–99)
Potassium: 4.3 mmol/L (ref 3.5–5.1)
Sodium: 138 mmol/L (ref 135–145)

## 2022-01-19 LAB — KAPPA/LAMBDA LIGHT CHAINS
Kappa free light chain: 58.7 mg/L — ABNORMAL HIGH (ref 3.3–19.4)
Kappa, lambda light chain ratio: 2.78 — ABNORMAL HIGH (ref 0.26–1.65)
Lambda free light chains: 21.1 mg/L (ref 5.7–26.3)

## 2022-01-20 LAB — IGG, IGA, IGM
IgA: 70 mg/dL — ABNORMAL LOW (ref 87–352)
IgG (Immunoglobin G), Serum: 1286 mg/dL (ref 586–1602)
IgM (Immunoglobulin M), Srm: 124 mg/dL (ref 26–217)

## 2022-01-23 LAB — PROTEIN ELECTROPHORESIS, SERUM
A/G Ratio: 1.3 (ref 0.7–1.7)
Albumin ELP: 4 g/dL (ref 2.9–4.4)
Alpha-1-Globulin: 0.2 g/dL (ref 0.0–0.4)
Alpha-2-Globulin: 0.8 g/dL (ref 0.4–1.0)
Beta Globulin: 0.8 g/dL (ref 0.7–1.3)
Gamma Globulin: 1.3 g/dL (ref 0.4–1.8)
Globulin, Total: 3.2 g/dL (ref 2.2–3.9)
M-Spike, %: 0.5 g/dL — ABNORMAL HIGH
Total Protein ELP: 7.2 g/dL (ref 6.0–8.5)

## 2022-01-25 ENCOUNTER — Encounter: Payer: Self-pay | Admitting: Medical Oncology

## 2022-01-25 ENCOUNTER — Inpatient Hospital Stay (HOSPITAL_BASED_OUTPATIENT_CLINIC_OR_DEPARTMENT_OTHER): Payer: Medicare HMO | Admitting: Medical Oncology

## 2022-01-25 VITALS — BP 127/83 | HR 91 | Temp 97.3°F | Wt 152.0 lb

## 2022-01-25 DIAGNOSIS — Z79899 Other long term (current) drug therapy: Secondary | ICD-10-CM | POA: Diagnosis not present

## 2022-01-25 DIAGNOSIS — E1122 Type 2 diabetes mellitus with diabetic chronic kidney disease: Secondary | ICD-10-CM | POA: Diagnosis not present

## 2022-01-25 DIAGNOSIS — N183 Chronic kidney disease, stage 3 unspecified: Secondary | ICD-10-CM | POA: Diagnosis not present

## 2022-01-25 DIAGNOSIS — E1142 Type 2 diabetes mellitus with diabetic polyneuropathy: Secondary | ICD-10-CM | POA: Diagnosis not present

## 2022-01-25 DIAGNOSIS — D472 Monoclonal gammopathy: Secondary | ICD-10-CM | POA: Diagnosis not present

## 2022-01-25 DIAGNOSIS — E1165 Type 2 diabetes mellitus with hyperglycemia: Secondary | ICD-10-CM | POA: Diagnosis not present

## 2022-01-25 DIAGNOSIS — Z794 Long term (current) use of insulin: Secondary | ICD-10-CM | POA: Diagnosis not present

## 2022-01-25 DIAGNOSIS — Z7982 Long term (current) use of aspirin: Secondary | ICD-10-CM | POA: Diagnosis not present

## 2022-01-25 DIAGNOSIS — N189 Chronic kidney disease, unspecified: Secondary | ICD-10-CM | POA: Diagnosis not present

## 2022-01-25 NOTE — Progress Notes (Signed)
[place]  [phone] [phone]  ID: Kara Leach OB: 1950-12-29  MR#: [id number]  HWE#:[id number]  Patient Care Team: Venita Lick, NP as PCP - General (Nurse Practitioner) Bary Castilla Forest Gleason, MD (General Surgery) Anthonette Legato, MD (Internal Medicine) Leanor Kail, MD (Inactive) (Orthopedic Surgery) Lloyd Huger, MD as Consulting Physician (Oncology) Vladimir Faster, [facility] (Inactive) (Pharmacist)  CHIEF COMPLAINT: MGUS  INTERVAL HISTORY: Patient returns to clinic today for routine yearly evaluation and discussion of her laboratory results.  She continues to feel well and remains asymptomatic.  She has no neurologic complaints.  She denies any recent fevers or illnesses.  She has a good appetite and denies weight loss.  She denies any pain.  She has no chest pain, shortness of breath, cough, or hemoptysis.  She denies any nausea, vomiting, constipation, or diarrhea.  She has no urinary complaints.  Patient offers no specific complaints today.  REVIEW OF SYSTEMS:   Review of Systems  Constitutional: Negative.  Negative for fever, malaise/fatigue and weight loss.  Respiratory: Negative.  Negative for shortness of breath.   Cardiovascular: Negative.  Negative for chest pain and leg swelling.  Gastrointestinal: Negative.  Negative for abdominal pain.  Genitourinary: Negative.  Negative for dysuria.  Musculoskeletal: Negative.  Negative for back pain.  Skin: Negative.  Negative for rash.  Neurological: Negative.  Negative for sensory change, focal weakness, weakness and headaches.  Psychiatric/Behavioral: Negative.  The patient is not nervous/anxious.     As per HPI. Otherwise, a complete review of systems is negative.  PAST MEDICAL HISTORY: Past Medical History:  Diagnosis Date   Allergy    Anxiety    Arthritis    Osteoarthritis   Cancer ([place]) 05/2018   possibly on left breast but not sure   Chronic kidney disease     Depression    Diabetes mellitus without complication ([place]) 8938   Diabetic neuropathy ([place])    feet   Diverticulitis 2017   GERD (gastroesophageal reflux disease)    Headache    sinus and migraines on occasion   Hyperlipidemia    Hypertension    Last menstrual period (LMP) > 10 days ago 1986   Obesity    Peripheral vascular disease (HCC)    neuropathy in hands and feet d/t diabetes   Renal insufficiency    Stage 2 kidney disease   Seizures ([place])    due to high blood sugars. last time = 09/2014   Sleep apnea    has CPAP, can't tolerate   Stroke ([place]) 1980   minor. left side remains weaker than right   Wears hearing aid    right side   Wrist weakness    left - after fracture and repair    PAST SURGICAL HISTORY: Past Surgical History:  Procedure Laterality Date   ABDOMINAL HYSTERECTOMY  1986   APPENDECTOMY  1964   BREAST BIOPSY Left 07/03/2016   benign, fibrocystic changes with calcifications, usual ductal hyperplasia.    BREAST BIOPSY Left 04/23/2018   radial scar   BREAST BIOPSY Left 08/13/2018   Procedure: WIDE LOCALIZATION AND OPEN BIOPSY OF LEFT BREAST, DIABETIC;  Surgeon: Robert Bellow, MD;  Location: [facility];  Service: General;  Laterality: Left;   BREAST EXCISIONAL BIOPSY Left 08/13/2018   CHOLECYSTECTOMY  2012   COLONOSCOPY WITH PROPOFOL N/A 12/01/2015   Procedure: COLONOSCOPY WITH PROPOFOL;  Surgeon: [name], MD;  Location: [place];  Service: Endoscopy;  Laterality: N/A;  Diabetic -  insulin Sleep apnea   PATELLA FRACTURE SURGERY Left    1. had to recenter kneecap, 2 and 3 surgeries were arthroscopies   WRIST FRACTURE SURGERY Left 2005   Steel plate    FAMILY HISTORY Family History  Problem Relation Age of Onset   Diabetes Mother    Heart disease Mother    Hypertension Mother    Cancer Father        stomach   Diabetes Sister    Arthritis Sister    Stroke Sister    Hypertension Sister    Breast cancer Maternal Aunt 20        great aunt   Parkinson's disease Maternal Grandmother    Tremor Maternal Grandmother        ADVANCED DIRECTIVES:    HEALTH MAINTENANCE: Social History   Tobacco Use   Smoking status: Never   Smokeless tobacco: Never  Vaping Use   Vaping Use: Never used  Substance Use Topics   Alcohol use: Not Currently    Alcohol/week: 0.0 standard drinks of alcohol    Comment: occasionally holiday, weddings   Drug use: No     Colonoscopy:  PAP:  Bone density:  Lipid panel:  Allergies  Allergen Reactions   Strawberry Extract Swelling    Lips swell and rash/whelps over body Difficulty breathing   Ibuprofen Other (See Comments)    "ringing in ears", dizziness Also, Advil / Aleve / Motrin   Cat Hair Extract Other (See Comments)    Sneezing    Current Outpatient Medications  Medication Sig Dispense Refill   acetaminophen (TYLENOL) 650 MG CR tablet Take 650 mg by mouth every 8 (eight) hours as needed for pain.     allopurinol (ZYLOPRIM) 100 MG tablet Take 1 tablet (100 mg total) by mouth daily. 90 tablet 4   aspirin EC 81 MG tablet Take 81 mg by mouth daily.     BD PEN NEEDLE NANO U/F 32G X 4 MM MISC USE EVERY MORNING 100 each 12   Blood Glucose Monitoring Suppl (ONETOUCH VERIO REFLECT) w/Device KIT USE TO CHECK BLOOD SUGAR DAILY     Calcium Carb-Cholecalciferol (CALCIUM 600 + D PO) Take 1 tablet by mouth 2 (two) times daily. 1000 IU VITAMIN D     dapagliflozin propanediol (FARXIGA) 10 MG TABS tablet Take 10 mg by mouth daily.     enalapril (VASOTEC) 2.5 MG tablet Take 1 tablet (2.5 mg total) by mouth daily. 90 tablet 4   fluticasone (FLONASE) 50 MCG/ACT nasal spray Place 2 sprays into both nostrils daily as needed for allergies. 48 mL 3   glucose blood (ONETOUCH ULTRA) test strip USE TO CHECK BLOOD SUGAR TWICE DAILY 100 strip 12   insulin detemir (LEVEMIR) 100 UNIT/ML FlexPen Inject 50 Units into the skin at bedtime. (Patient taking differently: Inject 44 Units into the skin at  bedtime.) 15 mL 11   Insulin Pen Needle (B-D ULTRAFINE III SHORT PEN) 31G X 8 MM MISC 1 each by Does not apply route 3 (three) times daily. 100 each 12   Lancets (ONETOUCH DELICA PLUS [id number]) MISC Use to check blood sugars 3 to 4 times a day. 200 each 4   liraglutide (VICTOZA) 18 MG/3ML SOPN ADMINISTER 1.2MG UNDER THE SKIN DAILY 18 mL 4   Loratadine (CLARITIN PO) Take 10 mg by mouth as needed.     MELATONIN PO Take by mouth as needed.     metoprolol succinate (TOPROL-XL) 25 MG 24 hr tablet Take 1 tablet (25  mg total) by mouth daily. 90 tablet 4   NOVOLOG FLEXPEN 100 UNIT/ML FlexPen INJECT 12-14 UNITS UNDER THE SKIN 3 TIMES DAILY WITH MEALS 15 mL 4   sertraline (ZOLOFT) 100 MG tablet Take 1 tablet (100 mg total) by mouth every morning. 90 tablet 4   simvastatin (ZOCOR) 40 MG tablet TAKE 1 TABLET BY MOUTH EVERYDAY AT BEDTIME 90 tablet 4   Specialty Vitamins Products (VITA-RX DIABETIC VITAMIN) CAPS Take 1 packet by mouth daily. Diabetic Vitamins - 6 or 7 in 1 pack     No current facility-administered medications for this visit.    OBJECTIVE: Vitals:   01/25/22 1109  BP: 127/83  Pulse: 91  Temp: (!) 97.3 F (36.3 C)     Body mass index is 31.77 kg/m.    ECOG FS:0 - Asymptomatic  General: Well-developed, well-nourished, no acute distress. Eyes: Pink conjunctiva, anicteric sclera. HEENT: Normocephalic, moist mucous membranes. Lungs: No audible wheezing or coughing. Heart: Regular rate and rhythm. Abdomen: Soft, nontender, no obvious distention. Musculoskeletal: No edema, cyanosis, or clubbing. Neuro: Alert, answering all questions appropriately. Cranial nerves grossly intact. Skin: No rashes or petechiae noted. Psych: Normal affect.   LAB RESULTS:  Lab Results  Component Value Date   NA 138 01/18/2022   K 4.3 01/18/2022   CL 105 01/18/2022   CO2 24 01/18/2022   GLUCOSE 135 (H) 01/18/2022   BUN 18 01/18/2022   CREATININE 1.10 (H) 01/18/2022   CALCIUM 9.8 01/18/2022   PROT  7.3 02/27/2021   ALBUMIN 4.4 02/27/2021   AST 27 02/27/2021   ALT 20 02/27/2021   ALKPHOS 114 02/27/2021   BILITOT 0.4 02/27/2021   GFRNONAA 54 (L) 01/18/2022   GFRAA 49 (L) 02/25/2020    Lab Results  Component Value Date   WBC 7.2 01/18/2022   NEUTROABS 2.9 01/18/2022   HGB 13.5 01/18/2022   HCT 41.1 01/18/2022   MCV 92.6 01/18/2022   PLT 261 01/18/2022   Lab Results  Component Value Date   TOTALPROTELP 7.2 01/18/2022   ALBUMINELP 4.0 01/18/2022   A1GS 0.2 01/18/2022   A2GS 0.8 01/18/2022   BETS 0.8 01/18/2022   GAMS 1.3 01/18/2022   MSPIKE 0.5 (H) 01/18/2022   SPEI Comment 01/18/2022     STUDIES: No results found.  ASSESSMENT: MGUS Encounter Diagnoses  Name Primary?   MGUS (monoclonal gammopathy of unknown significance) Yes   CKD stage 3 due to type 2 diabetes mellitus (HCC)    Diabetic polyneuropathy associated with type 2 diabetes mellitus ([place])     PLAN:    1.  MGUS: Upon review of patient's chart, her M spike was 0.3 in August 2013 and then 0.5 in March 2015.  Her M-spike on 01/18/2022 was 0.5 which is improved from 0.7 previous. Hgb, WBC, platelets are normal. Kidney function is stable. Light chains are essentially stable. Previously, metastatic bone survey was reported as negative.  Patient does not require bone marrow biopsy.  No intervention is needed.  Return to clinic in 1 year with repeat laboratory work and further evaluation. 2.  Chronic renal insufficiency: Stable and followed by nephrology.  3.  Hyperglycemia: Chronic. Managed by PCP.  4.  Peripheral neuropathy: Secondary to diabetes.  Patient does not complain of this today.    I spent a total of 20 minutes reviewing chart data, face-to-face evaluation with the patient, counseling and coordination of care as detailed above.    Patient expressed understanding and was in agreement with this plan. She also  understands that She can call clinic at any time with any questions, concerns, or complaints.    Hughie Closs, PA-C   01/25/2022 11:28 AM

## 2022-02-26 ENCOUNTER — Telehealth (INDEPENDENT_AMBULATORY_CARE_PROVIDER_SITE_OTHER): Payer: Self-pay | Admitting: Vascular Surgery

## 2022-02-26 NOTE — Telephone Encounter (Signed)
PT LVM stating that her cat destroyed her informed consent form for her laser on Thursday.   I ATC pt back to advise of cancelling but no VM and no answer. We will need to cancel the laser ablation procedure due to backordered supplies.   I will try back tomorrow.

## 2022-03-01 ENCOUNTER — Other Ambulatory Visit (INDEPENDENT_AMBULATORY_CARE_PROVIDER_SITE_OTHER): Payer: Medicare HMO | Admitting: Vascular Surgery

## 2022-03-08 ENCOUNTER — Encounter (INDEPENDENT_AMBULATORY_CARE_PROVIDER_SITE_OTHER): Payer: Medicare HMO

## 2022-03-12 NOTE — Patient Instructions (Signed)
Diabetes Mellitus Basics  Diabetes mellitus, or diabetes, is a long-term (chronic) disease. It occurs when the body does not properly use sugar (glucose) that is released from food after you eat. Diabetes mellitus may be caused by one or both of these problems: Your pancreas does not make enough of a hormone called insulin. Your body does not react in a normal way to the insulin that it makes. Insulin lets glucose enter cells in your body. This gives you energy. If you have diabetes, glucose cannot get into cells. This causes high blood glucose (hyperglycemia). How to treat and manage diabetes You may need to take insulin or other diabetes medicines daily to keep your glucose in balance. If you are prescribed insulin, you will learn how to give yourself insulin by injection. You may need to adjust the amount of insulin you take based on the foods that you eat. You will need to check your blood glucose levels using a glucose monitor as told by your health care provider. The readings can help determine if you have low or high blood glucose. Generally, you should have these blood glucose levels: Before meals (preprandial): 80-130 mg/dL (4.4-7.2 mmol/L). After meals (postprandial): below 180 mg/dL (10 mmol/L). Hemoglobin A1c (HbA1c) level: less than 7%. Your health care provider will set treatment goals for you. Keep all follow-up visits. This is important. Follow these instructions at home: Diabetes medicines Take your diabetes medicines every day as told by your health care provider. List your diabetes medicines here: Name of medicine: ______________________________ Amount (dose): _______________ Time (a.m./p.m.): _______________ Notes: ___________________________________ Name of medicine: ______________________________ Amount (dose): _______________ Time (a.m./p.m.): _______________ Notes: ___________________________________ Name of medicine: ______________________________ Amount (dose):  _______________ Time (a.m./p.m.): _______________ Notes: ___________________________________ Insulin If you use insulin, list the types of insulin you use here: Insulin type: ______________________________ Amount (dose): _______________ Time (a.m./p.m.): _______________Notes: ___________________________________ Insulin type: ______________________________ Amount (dose): _______________ Time (a.m./p.m.): _______________ Notes: ___________________________________ Insulin type: ______________________________ Amount (dose): _______________ Time (a.m./p.m.): _______________ Notes: ___________________________________ Insulin type: ______________________________ Amount (dose): _______________ Time (a.m./p.m.): _______________ Notes: ___________________________________ Insulin type: ______________________________ Amount (dose): _______________ Time (a.m./p.m.): _______________ Notes: ___________________________________ Managing blood glucose  Check your blood glucose levels using a glucose monitor as told by your health care provider. Write down the times that you check your glucose levels here: Time: _______________ Notes: ___________________________________ Time: _______________ Notes: ___________________________________ Time: _______________ Notes: ___________________________________ Time: _______________ Notes: ___________________________________ Time: _______________ Notes: ___________________________________ Time: _______________ Notes: ___________________________________  Low blood glucose Low blood glucose (hypoglycemia) is when glucose is at or below 70 mg/dL (3.9 mmol/L). Symptoms may include: Feeling: Hungry. Sweaty and clammy. Irritable or easily upset. Dizzy. Sleepy. Having: A fast heartbeat. A headache. A change in your vision. Numbness around the mouth, lips, or tongue. Having trouble with: Moving (coordination). Sleeping. Treating low blood glucose To treat low blood  glucose, eat or drink something containing sugar right away. If you can think clearly and swallow safely, follow the 15:15 rule: Take 15 grams of a fast-acting carb (carbohydrate), as told by your health care provider. Some fast-acting carbs are: Glucose tablets: take 3-4 tablets. Hard candy: eat 3-5 pieces. Fruit juice: drink 4 oz (120 mL). Regular (not diet) soda: drink 4-6 oz (120-180 mL). Honey or sugar: eat 1 Tbsp (15 mL). Check your blood glucose levels 15 minutes after you take the carb. If your glucose is still at or below 70 mg/dL (3.9 mmol/L), take 15 grams of a carb again. If your glucose does not go above 70 mg/dL (3.9 mmol/L) after   3 tries, get help right away. After your glucose goes back to normal, eat a meal or a snack within 1 hour. Treating very low blood glucose If your glucose is at or below 54 mg/dL (3 mmol/L), you have very low blood glucose (severe hypoglycemia). This is an emergency. Do not wait to see if the symptoms will go away. Get medical help right away. Call your local emergency services (911 in the [place] Do not drive yourself to the hospital. Questions to ask your health care provider Should I talk with a diabetes educator? What equipment will I need to care for myself at home? What diabetes medicines do I need? When should I take them? How often do I need to check my blood glucose levels? What number can I call if I have questions? When is my follow-up visit? Where can I find a support group for people with diabetes? Where to find more information American Diabetes Association: www.diabetes.org Association of Diabetes Care and Education Specialists: www.diabeteseducator.org Contact a health care provider if: Your blood glucose is at or above 240 mg/dL (13.3 mmol/L) for 2 days in a row. You have been sick or have had a fever for 2 days or more, and you are not getting better. You have any of these problems for more than 6 hours: You cannot eat or  drink. You feel nauseous. You vomit. You have diarrhea. Get help right away if: Your blood glucose is lower than 54 mg/dL (3 mmol/L). You get confused. You have trouble thinking clearly. You have trouble breathing. These symptoms may represent a serious problem that is an emergency. Do not wait to see if the symptoms will go away. Get medical help right away. Call your local emergency services (911 in the [place] Do not drive yourself to the hospital. Summary Diabetes mellitus is a chronic disease that occurs when the body does not properly use sugar (glucose) that is released from food after you eat. Take insulin and diabetes medicines as told. Check your blood glucose every day, as often as told. Keep all follow-up visits. This is important. This information is not intended to replace advice given to you by your health care provider. Make sure you discuss any questions you have with your health care provider. Document Revised: 07/07/2019 Document Reviewed: 07/07/2019 Elsevier Patient Education  2023 [employer]vier Inc.  

## 2022-03-14 ENCOUNTER — Ambulatory Visit (INDEPENDENT_AMBULATORY_CARE_PROVIDER_SITE_OTHER): Payer: Medicare HMO | Admitting: Nurse Practitioner

## 2022-03-14 ENCOUNTER — Encounter: Payer: Self-pay | Admitting: Nurse Practitioner

## 2022-03-14 VITALS — BP 114/73 | HR 82 | Temp 97.7°F | Ht <= 58 in | Wt 152.8 lb

## 2022-03-14 DIAGNOSIS — E559 Vitamin D deficiency, unspecified: Secondary | ICD-10-CM | POA: Diagnosis not present

## 2022-03-14 DIAGNOSIS — E1159 Type 2 diabetes mellitus with other circulatory complications: Secondary | ICD-10-CM

## 2022-03-14 DIAGNOSIS — K219 Gastro-esophageal reflux disease without esophagitis: Secondary | ICD-10-CM | POA: Diagnosis not present

## 2022-03-14 DIAGNOSIS — R251 Tremor, unspecified: Secondary | ICD-10-CM | POA: Diagnosis not present

## 2022-03-14 DIAGNOSIS — D472 Monoclonal gammopathy: Secondary | ICD-10-CM

## 2022-03-14 DIAGNOSIS — E538 Deficiency of other specified B group vitamins: Secondary | ICD-10-CM

## 2022-03-14 DIAGNOSIS — M8588 Other specified disorders of bone density and structure, other site: Secondary | ICD-10-CM | POA: Diagnosis not present

## 2022-03-14 DIAGNOSIS — M1A39X0 Chronic gout due to renal impairment, multiple sites, without tophus (tophi): Secondary | ICD-10-CM

## 2022-03-14 DIAGNOSIS — Z794 Long term (current) use of insulin: Secondary | ICD-10-CM | POA: Diagnosis not present

## 2022-03-14 DIAGNOSIS — E785 Hyperlipidemia, unspecified: Secondary | ICD-10-CM | POA: Diagnosis not present

## 2022-03-14 DIAGNOSIS — E1169 Type 2 diabetes mellitus with other specified complication: Secondary | ICD-10-CM | POA: Diagnosis not present

## 2022-03-14 DIAGNOSIS — R69 Illness, unspecified: Secondary | ICD-10-CM | POA: Diagnosis not present

## 2022-03-14 DIAGNOSIS — I152 Hypertension secondary to endocrine disorders: Secondary | ICD-10-CM | POA: Diagnosis not present

## 2022-03-14 DIAGNOSIS — E1122 Type 2 diabetes mellitus with diabetic chronic kidney disease: Secondary | ICD-10-CM

## 2022-03-14 DIAGNOSIS — Z0000 Encounter for general adult medical examination without abnormal findings: Secondary | ICD-10-CM | POA: Diagnosis not present

## 2022-03-14 DIAGNOSIS — R8281 Pyuria: Secondary | ICD-10-CM

## 2022-03-14 DIAGNOSIS — I83819 Varicose veins of unspecified lower extremities with pain: Secondary | ICD-10-CM

## 2022-03-14 DIAGNOSIS — F324 Major depressive disorder, single episode, in partial remission: Secondary | ICD-10-CM | POA: Diagnosis not present

## 2022-03-14 DIAGNOSIS — N1832 Chronic kidney disease, stage 3b: Secondary | ICD-10-CM

## 2022-03-14 DIAGNOSIS — N183 Chronic kidney disease, stage 3 unspecified: Secondary | ICD-10-CM | POA: Diagnosis not present

## 2022-03-14 DIAGNOSIS — E6609 Other obesity due to excess calories: Secondary | ICD-10-CM

## 2022-03-14 DIAGNOSIS — Z6831 Body mass index (BMI) 31.0-31.9, adult: Secondary | ICD-10-CM

## 2022-03-14 LAB — BAYER DCA HB A1C WAIVED: HB A1C (BAYER DCA - WAIVED): 7.7 % — ABNORMAL HIGH (ref 4.8–5.6)

## 2022-03-14 MED ORDER — METOPROLOL SUCCINATE ER 25 MG PO TB24: 25.0000 mg | ORAL_TABLET | Freq: Every day | ORAL | 4 refills | Status: DC

## 2022-03-14 MED ORDER — SEMAGLUTIDE (1 MG/DOSE) 4 MG/3ML ~~LOC~~ SOPN: 1.0000 mg | PEN_INJECTOR | SUBCUTANEOUS | 12 refills | Status: DC

## 2022-03-14 MED ORDER — ALLOPURINOL 100 MG PO TABS: 100.0000 mg | ORAL_TABLET | Freq: Every day | ORAL | 4 refills | Status: DC

## 2022-03-14 MED ORDER — SIMVASTATIN 40 MG PO TABS: ORAL_TABLET | ORAL | 4 refills | Status: DC

## 2022-03-14 MED ORDER — TRESIBA FLEXTOUCH 100 UNIT/ML ~~LOC~~ SOPN: 47.0000 [IU] | PEN_INJECTOR | Freq: Every day | SUBCUTANEOUS | 12 refills | Status: DC

## 2022-03-14 MED ORDER — SERTRALINE HCL 100 MG PO TABS: 100.0000 mg | ORAL_TABLET | Freq: Every morning | ORAL | 4 refills | Status: DC

## 2022-03-14 MED ORDER — ENALAPRIL MALEATE 2.5 MG PO TABS: 2.5000 mg | ORAL_TABLET | Freq: Every day | ORAL | 4 refills | Status: DC

## 2022-03-14 NOTE — Assessment & Plan Note (Signed)
Chronic, ongoing.  Continue current medication regimen and adjust as needed. Lipid panel today. 

## 2022-03-14 NOTE — Assessment & Plan Note (Signed)
Chronic, ongoing.  Continue Allopurinol 100 MG daily.  Discussed at length with patient.  Check uric acid level today.

## 2022-03-14 NOTE — Assessment & Plan Note (Signed)
Chronic, stable.  Continue collaboration with Dr. Grayland Ormond at [facility] - recent notes and labs reviewed.

## 2022-03-14 NOTE — Assessment & Plan Note (Signed)
Ongoing, stable.  Continue Vitamin D and calcium daily.  Check Vit D level today.  Repeat DEXA in July 2025.

## 2022-03-14 NOTE — Addendum Note (Signed)
Addended by: [name] T on: 03/14/2022 10:46 AM   Modules accepted: Orders

## 2022-03-14 NOTE — Progress Notes (Signed)
BP 114/73   Pulse 82   Temp 97.7 F (36.5 C) (Oral)   Ht 4' 9.99" (1.473 m)   Wt 152 lb 12.8 oz (69.3 kg)   LMP  (LMP Unknown)   SpO2 99%   BMI 31.94 kg/m    Subjective:    Patient ID: Kara Leach, female    DOB: 12/12/1950, 71 y.o.   MRN: [id number]  HPI: Kara Leach is a 71 y.o. female presenting on 03/14/2022 for comprehensive medical examination. Current medical complaints include:none  She currently lives with: self Menopausal Symptoms: no  DIABETES Last A1c 7.2% in September.  Continues on Novolog pre meal and Levemir + Victoza 1.8 MG daily, Farxiga 10 MG (working with CCM on this).  Has been cheating a little with foods due to season.    [place] is no longer going to cover Levemir or Victoza in new year -- can cover Ozempic and [place]. Hypoglycemic episodes:no Polydipsia/polyuria: no Visual disturbance: no Chest pain: no Paresthesias: no Glucose Monitoring: yes             Accucheck frequency: BID             Fasting glucose: 118 this morning -- running in 110 range             Post prandial:             Evening: 155 last night             Before meals: Taking Insulin?: yes             Long acting insulin: Levemir 47 units             Short acting insulin: Novolog 12 units morning/afternoon & 14 at dinner Blood Pressure Monitoring: daily Retinal Examination: Up To Date Foot Exam: Up to Date Pneumovax: Up to Date Influenza: Up to Date Aspirin: yes    HYPERTENSION / HYPERLIPIDEMIA Continues on Enalapril, Metoprolol, and ASA + Simvastatin.  Following with vascular, they have postponed varicose vein surgery due to equipment needs.  Last visit 12/04/21.    Amlodipine taken in past, but discontinued due to stable BP levels. Satisfied with current treatment? yes Duration of hypertension: chronic BP monitoring frequency: daily BP range: <130/80 on average at home BP medication side effects: no Duration of hyperlipidemia: chronic Cholesterol medication  side effects: no Cholesterol supplements: none Medication compliance: good compliance Aspirin: yes Recent stressors: no Recurrent headaches: no Visual changes: no Palpitations: no Dyspnea: no Chest pain: no Lower extremity edema: occasionally to feet, if up on feet a lot Dizzy/lightheaded: no The 10-year ASCVD risk score (Arnett DK, et al., 2019) is: 16.6%   Values used to calculate the score:     Age: 68 years     Sex: Female     Is Non-Hispanic African American: No     Diabetic: Yes     Tobacco smoker: No     Systolic Blood Pressure: 734 mmHg     Is BP treated: Yes     HDL Cholesterol: 39 mg/dL     Total Cholesterol: 150 mg/dL  CHRONIC KIDNEY DISEASE With GFR ranging from 39-60 over past two years.  Recent GFR 54.  Does see Dr. Holley Raring, last visit 11/23/21.  She is concerned for getting a UTI, is having more frequency and feels like not emptying completely.   CKD status: stable Medications renally dose: yes Previous renal evaluation: no Pneumovax:  Up to Date Influenza Vaccine:  Up to Date  OSTEOPENIA: Continues on daily calcium and Vitamin D supplements.  Noted on DEXA in July 2020.   Satisfied with current treatment?: yes Adequate calcium & vitamin D: yes Weight bearing exercises: yes    MGUS: Followed by providers at cancer center and last seen 01/25/22.  To continue yearly visits and labs with them.  She also follows with Dr. Bary Castilla after her mammograms for history of biopsies, last seen 05/05/20 -- he is retiring.   GOUT Continues on Allopurinol 100 MG daily.  Has not had gout flare in several years. Duration:chronic Swelling: no Redness: no Trauma: no Recent dietary change or indiscretion: no Fevers: no Nausea/vomiting: no Status:  stable Treatments attempted: Allopurinol  TREMORS: Saw neurology on 10/03/21 with no changes made -- to use cane more.  Her grandmother had a familial essential tremor.  Reports she has noticed more tremor in hands bilaterally at  rest and with activity, makes it hard to eat sometimes.  She feels off balance at times on feet, although reports never had had good balance. Tremors have been present for a long time (2012), but she feels they are getting worse.    DEPRESSION Continues on Sertraline 100 MG.  Winter months and holidays are a little tougher.   Mood status: stable Satisfied with current treatment?: yes Symptom severity: moderate  Duration of current treatment : chronic Side effects: no Medication compliance: good compliance Psychotherapy/counseling: none Previous psychiatric medications: Zoloft Depressed mood: yes Anxious mood: yes Anhedonia: no Significant weight loss or gain: no Insomnia: no Fatigue: no Feelings of worthlessness or guilt: no Impaired concentration/indecisiveness: yes Suicidal ideations: no Hopelessness: no Crying spells: no    03/14/2022   10:06 AM 12/13/2021    9:30 AM 10/04/2021   11:15 AM 09/12/2021    9:54 AM 05/29/2021    9:48 AM  Depression screen PHQ 2/9  Decreased Interest 1 1 0 1 1  Down, Depressed, Hopeless _0 PHQ - 2 Score _1 Altered sleeping _2 Tired, decreased energy _3 Change in appetite 1 0 1 0 0  Feeling bad or failure about yourself  1 2 0 1 2  Trouble concentrating 0 0 0 0 0  Moving slowly or fidgety/restless 0 0 0 0 1  Suicidal thoughts 0 0 0 0 0  PHQ-9 Score _4 Difficult doing work/chores Somewhat difficult Somewhat difficult Not difficult at all Somewhat difficult       03/14/2022   10:06 AM 12/13/2021    9:31 AM 09/12/2021    9:55 AM 05/29/2021    9:49 AM  GAD 7 : Generalized Anxiety Score  Nervous, Anxious, on Edge 1 0 0 2  Control/stop worrying _5 Worry too much - different things _6 Trouble relaxing 1 0 1 2  Restless 1 0 0 0  Easily annoyed or irritable 0 2 0 0  Afraid - awful might happen _7 Total GAD 7 Score _8 Anxiety Difficulty Somewhat difficult Somewhat difficult Somewhat  difficult Somewhat difficult       10/04/2021   11:10 AM 12/13/2021    9:30 AM 01/25/2022   11:09 AM 03/14/2022   10:05 AM 03/14/2022   10:31 AM  Fall Risk  Falls in the past year? 0 1  0 0  Was there an injury with Fall?  0 0  0 0  Fall Risk Category Calculator 0 2  0 0  Fall Risk Category Low Moderate  Low Low  Patient Fall Risk Level Moderate fall risk Moderate fall risk Low fall risk  Low fall risk  Patient at Risk for Falls Due to  History of fall(s)  No Fall Risks No Fall Risks  Fall risk Follow up Falls evaluation completed;Education provided;Falls prevention discussed Falls evaluation completed  Falls evaluation completed Falls prevention discussed    Functional Status Survey: Is the patient deaf or have difficulty hearing?: No Does the patient have difficulty seeing, even when wearing glasses/contacts?: No Does the patient have difficulty concentrating, remembering, or making decisions?: No Does the patient have difficulty walking or climbing stairs?: No Does the patient have difficulty dressing or bathing?: No Does the patient have difficulty doing errands alone such as visiting a doctor's office or shopping?: No      10/04/2021   11:10 AM 12/13/2021    9:30 AM 01/25/2022   11:09 AM 03/14/2022   10:05 AM 03/14/2022   10:31 AM  Fall Risk  Falls in the past year? 0 1  0 0  Was there an injury with Fall? 0 0  0 0  Fall Risk Category Calculator 0 2  0 0  Fall Risk Category Low Moderate  Low Low  Patient Fall Risk Level Moderate fall risk Moderate fall risk Low fall risk  Low fall risk  Patient at Risk for Falls Due to  History of fall(s)  No Fall Risks No Fall Risks  Fall risk Follow up Falls evaluation completed;Education provided;Falls prevention discussed Falls evaluation completed  Falls evaluation completed Falls prevention discussed    Past Medical History:  Past Medical History:  Diagnosis Date   Allergy    Anxiety    Arthritis    Osteoarthritis   Cancer ([place])  05/2018   possibly on left breast but not sure   Chronic kidney disease    Depression    Diabetes mellitus without complication ([place]) 4403   Diabetic neuropathy ([place])    feet   Diverticulitis 2017   GERD (gastroesophageal reflux disease)    Headache    sinus and migraines on occasion   Hyperlipidemia    Hypertension    Last menstrual period (LMP) > 10 days ago 1986   Obesity    Peripheral vascular disease (HCC)    neuropathy in hands and feet d/t diabetes   Renal insufficiency    Stage 2 kidney disease   Seizures ([place])    due to high blood sugars. last time = 09/2014   Sleep apnea    has CPAP, can't tolerate   Stroke ([place]) 1980   minor. left side remains weaker than right   Wears hearing aid    right side   Wrist weakness    left - after fracture and repair    Surgical History:  Past Surgical History:  Procedure Laterality Date   [place] Left 07/03/2016   benign, fibrocystic changes with calcifications, usual ductal hyperplasia.    BREAST BIOPSY Left 04/23/2018   radial scar   BREAST BIOPSY Left 08/13/2018   Procedure: WIDE LOCALIZATION AND OPEN BIOPSY OF LEFT BREAST, DIABETIC;  Surgeon: Robert Bellow, MD;  Location: [facility];  Service: General;  Laterality: Left;   BREAST EXCISIONAL BIOPSY Left 08/13/2018   CHOLECYSTECTOMY  2012   COLONOSCOPY WITH PROPOFOL N/A 12/01/2015  Procedure: COLONOSCOPY WITH PROPOFOL;  Surgeon: [name], MD;  Location: [facility];  Service: Endoscopy;  Laterality: N/A;  Diabetic - insulin Sleep apnea   [place] FRACTURE SURGERY Left    1. had to recenter kneecap, 2 and 3 surgeries were arthroscopies   WRIST FRACTURE SURGERY Left 2005   Steel plate    Medications:  Current Outpatient Medications on File Prior to Visit  Medication Sig   acetaminophen (TYLENOL) 650 MG CR tablet Take 650 mg by mouth every 8 (eight) hours as needed for pain.   aspirin EC 81 MG tablet  Take 81 mg by mouth daily.   BD PEN NEEDLE NANO U/F 32G X 4 MM MISC USE EVERY MORNING   Blood Glucose Monitoring Suppl (ONETOUCH VERIO REFLECT) w/Device KIT USE TO CHECK BLOOD SUGAR DAILY   Calcium Carb-Cholecalciferol (CALCIUM 600 + D PO) Take 1 tablet by mouth 2 (two) times daily. 1000 IU VITAMIN D   dapagliflozin propanediol (FARXIGA) 10 MG TABS tablet Take 10 mg by mouth daily.   fluticasone (FLONASE) 50 MCG/ACT nasal spray Place 2 sprays into both nostrils daily as needed for allergies.   glucose blood (ONETOUCH ULTRA) test strip USE TO CHECK BLOOD SUGAR TWICE DAILY   Insulin Pen Needle (B-D ULTRAFINE III SHORT PEN) 31G X 8 MM MISC 1 each by Does not apply route 3 (three) times daily.   Lancets (ONETOUCH DELICA PLUS [id number]) MISC Use to check blood sugars 3 to 4 times a day.   Loratadine (CLARITIN PO) Take 10 mg by mouth as needed.   MELATONIN PO Take by mouth as needed.   NOVOLOG FLEXPEN 100 UNIT/ML FlexPen INJECT 12-14 UNITS UNDER THE SKIN 3 TIMES DAILY WITH MEALS   Specialty Vitamins Products (VITA-RX DIABETIC VITAMIN) CAPS Take 1 packet by mouth daily. Diabetic Vitamins - 6 or 7 in 1 pack   No current facility-administered medications on file prior to visit.    Allergies:  Allergies  Allergen Reactions   Strawberry Extract Swelling    Lips swell and rash/whelps over body Difficulty breathing   Ibuprofen Other (See Comments)    "ringing in ears", dizziness Also, Advil / Aleve / Motrin   Cat Hair Extract Other (See Comments)    Sneezing    Social History:  Social History   Socioeconomic History   Marital status: Widowed    Spouse name: Not on file   Number of children: Not on file   Years of education: Not on file   Highest education level: Not on file  Occupational History   Occupation: worked at [employer] for 41 years    Comment: retired  Tobacco Use   Smoking status: Never   Smokeless tobacco: Never  Scientific laboratory technician Use: Never used  Substance and Sexual  Activity   Alcohol use: Not Currently    Alcohol/week: 0.0 standard drinks of alcohol    Comment: occasionally holiday, weddings   Drug use: No   Sexual activity: Never  Other Topics Concern   Not on file  Social History Narrative   Not on file   Social Determinants of Health   Financial Resource Strain: Medium Risk (10/04/2021)   Overall Financial Resource Strain (CARDIA)    Difficulty of Paying Living Expenses: Somewhat hard  Food Insecurity: No Food Insecurity (10/04/2021)   Hunger Vital Sign    Worried About Running Out of Food in the Last Year: Never true    Ran Out of Food in the Last Year: Never  true  Transportation Needs: No Transportation Needs (10/04/2021)   PRAPARE - Hydrologist (Medical): No    Lack of Transportation (Non-Medical): No  Physical Activity: Insufficiently Active (10/04/2021)   Exercise Vital Sign    Days of Exercise per Week: 3 days    Minutes of Exercise per Session: 20 min  Stress: No Stress Concern Present (10/04/2021)   [place]    Feeling of Stress : Not at all  Social Connections: Moderately Isolated (10/04/2021)   Social Connection and Isolation Panel [NHANES]    Frequency of Communication with Friends and Family: Three times a week    Frequency of Social Gatherings with Friends and Family: Once a week    Attends Religious Services: More than 4 times per year    Active Member of [employer] or Organizations: No    Attends Archivist Meetings: Never    Marital Status: Widowed  Intimate Partner Violence: Not At Risk (10/04/2021)   Humiliation, Afraid, Rape, and Kick questionnaire    Fear of Current or Ex-Partner: No    Emotionally Abused: No    Physically Abused: No    Sexually Abused: No   Social History   Tobacco Use  Smoking Status Never  Smokeless Tobacco Never   Social History   Substance and Sexual Activity  Alcohol Use Not Currently    Alcohol/week: 0.0 standard drinks of alcohol   Comment: occasionally holiday, weddings    Family History:  Family History  Problem Relation Age of Onset   Diabetes Mother    Heart disease Mother    Hypertension Mother    Cancer Father        stomach   Diabetes Sister    Arthritis Sister    Stroke Sister    Hypertension Sister    Breast cancer Maternal Aunt 20       great aunt   Parkinson's disease Maternal Grandmother    Tremor Maternal Grandmother    Past medical history, surgical history, medications, allergies, family history and social history reviewed with patient today and changes made to appropriate areas of the chart.   ROS All other ROS negative except what is listed above and in the HPI.      Objective:    BP 114/73   Pulse 82   Temp 97.7 F (36.5 C) (Oral)   Ht 4' 9.99" (1.473 m)   Wt 152 lb 12.8 oz (69.3 kg)   LMP  (LMP Unknown)   SpO2 99%   BMI 31.94 kg/m   Wt Readings from Last 3 Encounters:  03/14/22 152 lb 12.8 oz (69.3 kg)  01/25/22 152 lb (68.9 kg)  12/13/21 154 lb 8 oz (70.1 kg)    Physical Exam Vitals and nursing note reviewed. Exam conducted with a chaperone present.  Constitutional:      General: She is awake. She is not in acute distress.    Appearance: She is well-developed. She is not ill-appearing.  HENT:     Head: Normocephalic and atraumatic.     Right Ear: Hearing, tympanic membrane, ear canal and external ear normal. No drainage.     Left Ear: Hearing, tympanic membrane, ear canal and external ear normal. No drainage.     Nose: Nose normal.     Right Sinus: No maxillary sinus tenderness or frontal sinus tenderness.     Left Sinus: No maxillary sinus tenderness or frontal sinus tenderness.  Mouth/Throat:     Mouth: Mucous membranes are moist.     Pharynx: Oropharynx is clear. Uvula midline. No pharyngeal swelling, oropharyngeal exudate or posterior oropharyngeal erythema.  Eyes:     General: Lids are normal.        Right  eye: No discharge.        Left eye: No discharge.     Extraocular Movements: Extraocular movements intact.     Conjunctiva/sclera: Conjunctivae normal.     Pupils: Pupils are equal, round, and reactive to light.     Visual Fields: Right eye visual fields normal and left eye visual fields normal.  Neck:     Thyroid: No thyromegaly.     Vascular: No carotid bruit.     Trachea: Trachea normal.  Cardiovascular:     Rate and Rhythm: Normal rate and regular rhythm.     Heart sounds: Normal heart sounds. No murmur heard.    No gallop.  Pulmonary:     Effort: Pulmonary effort is normal. No accessory muscle usage or respiratory distress.     Breath sounds: Normal breath sounds.  Chest:     Comments: Deferred per request, has performed with [place]. Abdominal:     General: Bowel sounds are normal.     Palpations: Abdomen is soft. There is no hepatomegaly or splenomegaly.     Tenderness: There is no abdominal tenderness.  Musculoskeletal:        General: Normal range of motion.     Cervical back: Normal range of motion and neck supple.     Right lower leg: No edema.     Left lower leg: No edema.  Lymphadenopathy:     Head:     Right side of head: No submental, submandibular, tonsillar, preauricular or posterior auricular adenopathy.     Left side of head: No submental, submandibular, tonsillar, preauricular or posterior auricular adenopathy.     Cervical: No cervical adenopathy.  Skin:    General: Skin is warm and dry.     Capillary Refill: Capillary refill takes less than 2 seconds.     Findings: No rash.  Neurological:     Mental Status: She is alert and oriented to person, place, and time.     Cranial Nerves: Cranial nerves 2-12 are intact.     Motor: Tremor (bilateral upper extremity with rest.) present. No weakness.     Coordination: Coordination is intact.     Gait: Gait is intact.     Deep Tendon Reflexes: Reflexes are normal and symmetric.     Reflex Scores:       Brachioradialis reflexes are 2+ on the right side and 2+ on the left side.      Patellar reflexes are 2+ on the right side and 2+ on the left side.    Comments: No cogwheel noted and no gait changes.  Psychiatric:        Attention and Perception: Attention normal.        Mood and Affect: Mood normal.        Speech: Speech normal.        Behavior: Behavior normal. Behavior is cooperative.        Thought Content: Thought content normal.        Judgment: Judgment normal.    Diabetic Foot Exam - Simple   Simple Foot Form Visual Inspection No deformities, no ulcerations, no other skin breakdown bilaterally: Yes Sensation Testing Intact to touch and monofilament testing bilaterally: Yes Pulse Check Posterior  Tibialis and Dorsalis pulse intact bilaterally: Yes Comments     Results for orders placed or performed in visit on 03/14/22  Bayer DCA Hb A1c Waived  Result Value Ref Range   HB A1C (BAYER DCA - WAIVED) 7.7 (H) 4.8 - 5.6 %      Assessment & Plan:   Problem List Items Addressed This Visit       Cardiovascular and Mediastinum   Hypertension associated with diabetes ([place]) (Chronic)    Chronic, stable.  BP remains well below goal in office and at home.  Continue current medication regimen and adjust as needed.  Alert provider if BP consistently >130/80 at home and then may restart Amlodipine at 2.5 MG.  Continue Metoprolol and Enalapril and adjust as needed.  Focus on DASH diet.  Enalapril for kidney protection.  LABS: CMP, TSH.       Relevant Medications   Semaglutide, 1 MG/DOSE, 4 MG/3ML SOPN (Start on 03/19/2022)   insulin degludec (TRESIBA FLEXTOUCH) 100 UNIT/ML FlexTouch Pen (Start on 03/19/2022)   enalapril (VASOTEC) 2.5 MG tablet   metoprolol succinate (TOPROL-XL) 25 MG 24 hr tablet   simvastatin (ZOCOR) 40 MG tablet   Other Relevant Orders   Bayer DCA Hb A1c Waived (Completed)   Comprehensive metabolic panel   CBC with Differential/Platelet   TSH     Digestive    Gastroesophageal reflux disease without esophagitis (Chronic)    Chronic, stable.  Continue current medication regimen, Famotidine, and check Mag level today.          Endocrine   Hyperlipidemia associated with type 2 diabetes mellitus (HCC) (Chronic)    Chronic, ongoing.  Continue current medication regimen and adjust as needed.  Lipid panel today.      Relevant Medications   Semaglutide, 1 MG/DOSE, 4 MG/3ML SOPN (Start on 03/19/2022)   insulin degludec (TRESIBA FLEXTOUCH) 100 UNIT/ML FlexTouch Pen (Start on 03/19/2022)   enalapril (VASOTEC) 2.5 MG tablet   metoprolol succinate (TOPROL-XL) 25 MG 24 hr tablet   simvastatin (ZOCOR) 40 MG tablet   Other Relevant Orders   Bayer DCA Hb A1c Waived (Completed)   Lipid Panel w/o Chol/HDL Ratio   Comprehensive metabolic panel   Type 2 diabetes mellitus with stage 3b chronic kidney disease, with long-term current use of insulin (HCC) - Primary (Chronic)    Chronic, ongoing with A1c 7.7% today, trend up due to diet, and urine ALB 10 (March 2023).  At this time recommend she check sugars 2 hours after a meal as well to see if running higher at that time.  Will send in changes due to insurance -- [place] and [place], stop Levemir and Victoza in January -- continue [place] and [place].  Goal in long run would be to reduce insulin, but at this time she is not a goal.  Eye and foot exam up to date.  UA today and will treat as needed if UTI present.  Return in February to check changes.      Relevant Medications   Semaglutide, 1 MG/DOSE, 4 MG/3ML SOPN (Start on 03/19/2022)   insulin degludec (TRESIBA FLEXTOUCH) 100 UNIT/ML FlexTouch Pen (Start on 03/19/2022)   enalapril (VASOTEC) 2.5 MG tablet   simvastatin (ZOCOR) 40 MG tablet   Other Relevant Orders   Bayer DCA Hb A1c Waived (Completed)   Comprehensive metabolic panel   Urinalysis, Routine w reflex microscopic     Musculoskeletal and Integument   Osteopenia of spine (Chronic)    Ongoing, stable.   Continue  Vitamin D and calcium daily.  Check Vit D level today.  Repeat DEXA in July 2025.        Relevant Orders   VITAMIN D 25 Hydroxy (Vit-D Deficiency, Fractures)     Other   MGUS (monoclonal gammopathy of unknown significance) (Chronic)    Chronic, stable.  Continue collaboration with Dr. Grayland Ormond at [facility] - recent notes and labs reviewed.      Obesity (Chronic)    BMI 31.94.  Recommended eating smaller high protein, low fat meals more frequently and exercising 30 mins a day 5 times a week with a goal of 10-15lb weight loss in the next 3 months. Patient voiced their understanding and motivation to adhere to these recommendations.       Relevant Medications   Semaglutide, 1 MG/DOSE, 4 MG/3ML SOPN (Start on 03/19/2022)   insulin degludec (TRESIBA FLEXTOUCH) 100 UNIT/ML FlexTouch Pen (Start on 03/19/2022)   B12 deficiency    Chronic, stable.  Continue supplement at home and recheck level today.      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  Continue Allopurinol 100 MG daily.  Discussed at length with patient.  Check uric acid level today.      Relevant Orders   Uric acid   Depression, major, single episode, in partial remission (HCC)    Chronic, stable.  Denies SI/HI.  Continue current medication regimen, Sertraline 100 MG daily, and adjust as needed. Recommend meditation and relaxation techniques at home.      Relevant Medications   sertraline (ZOLOFT) 100 MG tablet   Tremor of both hands    Chronic.  At this time will continue collaboration with neurology, recent notes reviewed.      Vitamin D deficiency    Chronic with osteopenia.  Check Vitamin D level and adjust as needed.      Relevant Orders   VITAMIN D 25 Hydroxy (Vit-D Deficiency, Fractures)   Other Visit Diagnoses     Encounter for annual physical exam       Annual physical today with labs and health maintenance reviewed,  discussed with patient.        Follow up plan: Return in about 6 weeks (around 04/25/2022) for DIABETES -- med changes to [place] and Ozempic.   LABORATORY TESTING:  - Pap smear: not applicable  IMMUNIZATIONS:   - Tdap: Tetanus vaccination status reviewed: provide next visit. - Influenza: Up to date - Pneumovax: Up to date - Prevnar: Up to date - COVID: Up to date - HPV: Not applicable - Shingrix vaccine: Up to date  SCREENING: -Mammogram: Up to date  - Colonoscopy: Up to date  - Bone Density: Up to date  -Hearing Test: Not applicable  -Spirometry: Not applicable   PATIENT COUNSELING:   Advised to take 1 mg of folate supplement per day if capable of pregnancy.   Sexuality: Discussed sexually transmitted diseases, partner selection, use of condoms, avoidance of unintended pregnancy  and contraceptive alternatives.   Advised to avoid cigarette smoking.  I discussed with the patient that most people either abstain from alcohol or drink within safe limits (<=14/week and <=4 drinks/occasion for males, <=7/weeks and <= 3 drinks/occasion for females) and that the risk for alcohol disorders and other health effects rises proportionally with the number of drinks per week and how often a drinker exceeds daily limits.  Discussed cessation/primary prevention of drug use and  availability of treatment for abuse.   Diet: Encouraged to adjust caloric intake to maintain  or achieve ideal body weight, to reduce intake of dietary saturated fat and total fat, to limit sodium intake by avoiding high sodium foods and not adding table salt, and to maintain adequate dietary potassium and calcium preferably from fresh fruits, vegetables, and low-fat dairy products.    Stressed the importance of regular exercise  Injury prevention: Discussed safety belts, safety helmets, smoke detector, smoking near bedding or upholstery.   Dental health: Discussed importance of regular tooth brushing, flossing, and  dental visits.    NEXT PREVENTATIVE PHYSICAL DUE IN 1 YEAR. Return in about 6 weeks (around 04/25/2022) for DIABETES -- med changes to [place] and Ozempic.

## 2022-03-14 NOTE — Assessment & Plan Note (Signed)
Chronic, stable.  Continue current medication regimen, Famotidine, and check Mag level today.

## 2022-03-14 NOTE — Assessment & Plan Note (Signed)
Chronic, stable.  Continue supplement at home and recheck level today.

## 2022-03-14 NOTE — Assessment & Plan Note (Signed)
BMI 31.94.  Recommended eating smaller high protein, low fat meals more frequently and exercising 30 mins a day 5 times a week with a goal of 10-15lb weight loss in the next 3 months. Patient voiced their understanding and motivation to adhere to these recommendations.

## 2022-03-14 NOTE — Assessment & Plan Note (Signed)
Chronic, stable.  BP remains well below goal in office and at home.  Continue current medication regimen and adjust as needed.  Alert provider if BP consistently >130/80 at home and then may restart Amlodipine at 2.5 MG.  Continue Metoprolol and Enalapril and adjust as needed.  Focus on DASH diet.  Enalapril for kidney protection.  LABS: CMP, TSH.

## 2022-03-14 NOTE — Assessment & Plan Note (Addendum)
Chronic, ongoing with A1c 7.7% today, trend up due to diet, and urine ALB 10 (March 2023).  At this time recommend she check sugars 2 hours after a meal as well to see if running higher at that time.  Will send in changes due to insurance -- [place] and [place], stop Levemir and Victoza in January -- continue [place] and [place].  Goal in long run would be to reduce insulin, but at this time she is not a goal.  Eye and foot exam up to date.  UA today and will treat as needed if UTI present.  Return in February to check changes.

## 2022-03-14 NOTE — Assessment & Plan Note (Signed)
Chronic.  At this time will continue collaboration with neurology, recent notes reviewed.

## 2022-03-14 NOTE — Assessment & Plan Note (Signed)
Chronic, stable.  Denies SI/HI.  Continue current medication regimen, Sertraline 100 MG daily, and adjust as needed. Recommend meditation and relaxation techniques at home.

## 2022-03-14 NOTE — Assessment & Plan Note (Signed)
Chronic with osteopenia.  Check Vitamin D level and adjust as needed.

## 2022-03-15 DIAGNOSIS — R8281 Pyuria: Secondary | ICD-10-CM | POA: Diagnosis not present

## 2022-03-15 LAB — COMPREHENSIVE METABOLIC PANEL
ALT: 22 IU/L (ref 0–32)
AST: 36 IU/L (ref 0–40)
Albumin/Globulin Ratio: 1.6 (ref 1.2–2.2)
Albumin: 4.5 g/dL (ref 3.9–4.9)
Alkaline Phosphatase: 103 IU/L (ref 44–121)
BUN/Creatinine Ratio: 16 (ref 12–28)
BUN: 18 mg/dL (ref 8–27)
Bilirubin Total: 0.3 mg/dL (ref 0.0–1.2)
CO2: 22 mmol/L (ref 20–29)
Calcium: 10.3 mg/dL (ref 8.7–10.3)
Chloride: 102 mmol/L (ref 96–106)
Creatinine, Ser: 1.15 mg/dL — ABNORMAL HIGH (ref 0.57–1.00)
Globulin, Total: 2.8 g/dL (ref 1.5–4.5)
Glucose: 164 mg/dL — ABNORMAL HIGH (ref 70–99)
Potassium: 4.5 mmol/L (ref 3.5–5.2)
Sodium: 140 mmol/L (ref 134–144)
Total Protein: 7.3 g/dL (ref 6.0–8.5)
eGFR: 52 mL/min/{1.73_m2} — ABNORMAL LOW (ref >59)

## 2022-03-15 LAB — CBC WITH DIFFERENTIAL/PLATELET
Basophils Absolute: 0.1 10*3/uL (ref 0.0–0.2)
Basos: 1 %
EOS (ABSOLUTE): 0.3 10*3/uL (ref 0.0–0.4)
Eos: 3 %
Hematocrit: 42.8 % (ref 34.0–46.6)
Hemoglobin: 14.1 g/dL (ref 11.1–15.9)
Immature Grans (Abs): 0 10*3/uL (ref 0.0–0.1)
Immature Granulocytes: 0 %
Lymphocytes Absolute: 4 10*3/uL — ABNORMAL HIGH (ref 0.7–3.1)
Lymphs: 46 %
MCH: 31 pg (ref 26.6–33.0)
MCHC: 32.9 g/dL (ref 31.5–35.7)
MCV: 94 fL (ref 79–97)
Monocytes Absolute: 0.6 10*3/uL (ref 0.1–0.9)
Monocytes: 8 %
Neutrophils Absolute: 3.6 10*3/uL (ref 1.4–7.0)
Neutrophils: 42 %
Platelets: 286 10*3/uL (ref 150–450)
RBC: 4.55 x10E6/uL (ref 3.77–5.28)
RDW: 13.5 % (ref 11.7–15.4)
WBC: 8.6 10*3/uL (ref 3.4–10.8)

## 2022-03-15 LAB — TSH: TSH: 0.995 u[IU]/mL (ref 0.450–4.500)

## 2022-03-15 LAB — VITAMIN B12: Vitamin B-12: 780 pg/mL (ref 232–1245)

## 2022-03-15 LAB — URINALYSIS, ROUTINE W REFLEX MICROSCOPIC
Bilirubin, UA: NEGATIVE
Ketones, UA: NEGATIVE
Nitrite, UA: NEGATIVE
Protein,UA: NEGATIVE
RBC, UA: NEGATIVE
Specific Gravity, UA: <1.005 — ABNORMAL LOW (ref 1.005–1.030)
Urobilinogen, Ur: 0.2 mg/dL (ref 0.2–1.0)
pH, UA: 5 (ref 5.0–7.5)

## 2022-03-15 LAB — LIPID PANEL W/O CHOL/HDL RATIO
Cholesterol, Total: 128 mg/dL (ref 100–199)
HDL: 38 mg/dL — ABNORMAL LOW (ref >39)
LDL Chol Calc (NIH): 51 mg/dL (ref 0–99)
Triglycerides: 246 mg/dL — ABNORMAL HIGH (ref 0–149)
VLDL Cholesterol Cal: 39 mg/dL (ref 5–40)

## 2022-03-15 LAB — MICROSCOPIC EXAMINATION

## 2022-03-15 LAB — URIC ACID: Uric Acid: 4.9 mg/dL (ref 3.0–7.2)

## 2022-03-15 LAB — VITAMIN D 25 HYDROXY (VIT D DEFICIENCY, FRACTURES): Vit D, 25-Hydroxy: 40.8 ng/mL (ref 30.0–100.0)

## 2022-03-15 MED ORDER — AMOXICILLIN-POT CLAVULANATE 875-125 MG PO TABS: 1.0000 | ORAL_TABLET | Freq: Two times a day (BID) | ORAL | 0 refills | Status: AC

## 2022-03-15 NOTE — Addendum Note (Signed)
Addended by: [name] T on: 03/15/2022 02:51 PM   Modules accepted: Orders

## 2022-03-15 NOTE — Addendum Note (Signed)
Addended by: [name] T on: 03/15/2022 03:18 PM   Modules accepted: Orders

## 2022-03-15 NOTE — Progress Notes (Signed)
Good afternoon, please let Tishana know labs have returned and overall remain stable with exception of urine, looks like an infection may be present.  I will send in Augmentin for treatment and if culture returns noting need for a different antibiotic will let you know.  Any questions?  Have a great day!!

## 2022-03-18 LAB — URINE CULTURE

## 2022-03-18 NOTE — Progress Notes (Signed)
Good morning, please let Annaly know the Augmentin I sent in for her urine infection is susceptible to what is growing in urine, continue this until complete:)

## 2022-03-27 ENCOUNTER — Other Ambulatory Visit: Payer: Self-pay | Admitting: Nurse Practitioner

## 2022-03-27 DIAGNOSIS — N1831 Chronic kidney disease, stage 3a: Secondary | ICD-10-CM | POA: Diagnosis not present

## 2022-03-27 DIAGNOSIS — I10 Essential (primary) hypertension: Secondary | ICD-10-CM | POA: Diagnosis not present

## 2022-03-27 DIAGNOSIS — E1122 Type 2 diabetes mellitus with diabetic chronic kidney disease: Secondary | ICD-10-CM | POA: Diagnosis not present

## 2022-03-27 NOTE — Telephone Encounter (Signed)
Requested Prescriptions  Pending Prescriptions Disp Refills   NOVOLOG FLEXPEN 100 UNIT/ML FlexPen [Pharmacy Med Name: NOVOLOG 100 UNIT/ML FLEXPEN] 15 mL 0    Sig: INJECT 12-14 UNITS UNDER THE SKIN 3 TIMES DAILY WITH MEALS     Endocrinology:  Diabetes - Insulins Passed - 03/27/2022  1:30 AM      Passed - HBA1C is between 0 and 7.9 and within 180 days    Hemoglobin A1C  Date Value Ref Range Status  11/12/2013 13.4 (H) 4.2 - 6.3 % Final    Comment:    The American Diabetes Association recommends that a primary goal of therapy should be <7% and that physicians should reevaluate the treatment regimen in patients with HbA1c values consistently >8%.    HB A1C (BAYER DCA - WAIVED)  Date Value Ref Range Status  03/14/2022 7.7 (H) 4.8 - 5.6 % Final    Comment:             Prediabetes: 5.7 - 6.4          Diabetes: >6.4          Glycemic control for adults with diabetes: <7.0          Passed - Valid encounter within last 6 months    Recent Outpatient Visits           1 week ago Type 2 diabetes mellitus with stage 3b chronic kidney disease, with long-term current use of insulin ([place])   [place], Jolene T, NP   3 months ago Type 2 diabetes mellitus with stage 3b chronic kidney disease, with long-term current use of insulin ([place])   [place], Jolene T, NP   6 months ago Type 2 diabetes mellitus with stage 3b chronic kidney disease, with long-term current use of insulin ([place])   [place], Jolene T, NP   10 months ago Type 2 diabetes mellitus with stage 3b chronic kidney disease, with long-term current use of insulin ([place])   [place] Sheldahl, [place] T, NP   1 year ago Type 2 diabetes mellitus with stage 3b chronic kidney disease, with long-term current use of insulin ([place])   [place], Barbaraann Faster, NP       Future Appointments             In 4 weeks Cannady, Barbaraann Faster, NP [employer], [facility]

## 2022-03-29 ENCOUNTER — Ambulatory Visit (INDEPENDENT_AMBULATORY_CARE_PROVIDER_SITE_OTHER): Payer: Medicare HMO | Admitting: Vascular Surgery

## 2022-04-18 DIAGNOSIS — M3501 Sicca syndrome with keratoconjunctivitis: Secondary | ICD-10-CM | POA: Diagnosis not present

## 2022-04-18 DIAGNOSIS — E119 Type 2 diabetes mellitus without complications: Secondary | ICD-10-CM | POA: Diagnosis not present

## 2022-04-18 DIAGNOSIS — H2513 Age-related nuclear cataract, bilateral: Secondary | ICD-10-CM | POA: Diagnosis not present

## 2022-04-18 LAB — HM DIABETES EYE EXAM

## 2022-04-21 ENCOUNTER — Other Ambulatory Visit: Payer: Self-pay | Admitting: Nurse Practitioner

## 2022-04-22 NOTE — Patient Instructions (Signed)
Diabetes Mellitus Basics  Diabetes mellitus, or diabetes, is a long-term (chronic) disease. It occurs when the body does not properly use sugar (glucose) that is released from food after you eat. Diabetes mellitus may be caused by one or both of these problems: Your pancreas does not make enough of a hormone called insulin. Your body does not react in a normal way to the insulin that it makes. Insulin lets glucose enter cells in your body. This gives you energy. If you have diabetes, glucose cannot get into cells. This causes high blood glucose (hyperglycemia). How to treat and manage diabetes You may need to take insulin or other diabetes medicines daily to keep your glucose in balance. If you are prescribed insulin, you will learn how to give yourself insulin by injection. You may need to adjust the amount of insulin you take based on the foods that you eat. You will need to check your blood glucose levels using a glucose monitor as told by your health care provider. The readings can help determine if you have low or high blood glucose. Generally, you should have these blood glucose levels: Before meals (preprandial): 80-130 mg/dL (4.4-7.2 mmol/L). After meals (postprandial): below 180 mg/dL (10 mmol/L). Hemoglobin A1c (HbA1c) level: less than 7%. Your health care provider will set treatment goals for you. Keep all follow-up visits. This is important. Follow these instructions at home: Diabetes medicines Take your diabetes medicines every day as told by your health care provider. List your diabetes medicines here: Name of medicine: ______________________________ Amount (dose): _______________ Time (a.m./p.m.): _______________ Notes: ___________________________________ Name of medicine: ______________________________ Amount (dose): _______________ Time (a.m./p.m.): _______________ Notes: ___________________________________ Name of medicine: ______________________________ Amount (dose):  _______________ Time (a.m./p.m.): _______________ Notes: ___________________________________ Insulin If you use insulin, list the types of insulin you use here: Insulin type: ______________________________ Amount (dose): _______________ Time (a.m./p.m.): _______________Notes: ___________________________________ Insulin type: ______________________________ Amount (dose): _______________ Time (a.m./p.m.): _______________ Notes: ___________________________________ Insulin type: ______________________________ Amount (dose): _______________ Time (a.m./p.m.): _______________ Notes: ___________________________________ Insulin type: ______________________________ Amount (dose): _______________ Time (a.m./p.m.): _______________ Notes: ___________________________________ Insulin type: ______________________________ Amount (dose): _______________ Time (a.m./p.m.): _______________ Notes: ___________________________________ Managing blood glucose  Check your blood glucose levels using a glucose monitor as told by your health care provider. Write down the times that you check your glucose levels here: Time: _______________ Notes: ___________________________________ Time: _______________ Notes: ___________________________________ Time: _______________ Notes: ___________________________________ Time: _______________ Notes: ___________________________________ Time: _______________ Notes: ___________________________________ Time: _______________ Notes: ___________________________________  Low blood glucose Low blood glucose (hypoglycemia) is when glucose is at or below 70 mg/dL (3.9 mmol/L). Symptoms may include: Feeling: Hungry. Sweaty and clammy. Irritable or easily upset. Dizzy. Sleepy. Having: A fast heartbeat. A headache. A change in your vision. Numbness around the mouth, lips, or tongue. Having trouble with: Moving (coordination). Sleeping. Treating low blood glucose To treat low blood  glucose, eat or drink something containing sugar right away. If you can think clearly and swallow safely, follow the 15:15 rule: Take 15 grams of a fast-acting carb (carbohydrate), as told by your health care provider. Some fast-acting carbs are: Glucose tablets: take 3-4 tablets. Hard candy: eat 3-5 pieces. Fruit juice: drink 4 oz (120 mL). Regular (not diet) soda: drink 4-6 oz (120-180 mL). Honey or sugar: eat 1 Tbsp (15 mL). Check your blood glucose levels 15 minutes after you take the carb. If your glucose is still at or below 70 mg/dL (3.9 mmol/L), take 15 grams of a carb again. If your glucose does not go above 70 mg/dL (3.9 mmol/L) after   3 tries, get help right away. After your glucose goes back to normal, eat a meal or a snack within 1 hour. Treating very low blood glucose If your glucose is at or below 54 mg/dL (3 mmol/L), you have very low blood glucose (severe hypoglycemia). This is an emergency. Do not wait to see if the symptoms will go away. Get medical help right away. Call your local emergency services (911 in the [place] Do not drive yourself to the hospital. Questions to ask your health care provider Should I talk with a diabetes educator? What equipment will I need to care for myself at home? What diabetes medicines do I need? When should I take them? How often do I need to check my blood glucose levels? What number can I call if I have questions? When is my follow-up visit? Where can I find a support group for people with diabetes? Where to find more information American Diabetes Association: www.diabetes.org Association of Diabetes Care and Education Specialists: www.diabeteseducator.org Contact a health care provider if: Your blood glucose is at or above 240 mg/dL (13.3 mmol/L) for 2 days in a row. You have been sick or have had a fever for 2 days or more, and you are not getting better. You have any of these problems for more than 6 hours: You cannot eat or  drink. You feel nauseous. You vomit. You have diarrhea. Get help right away if: Your blood glucose is lower than 54 mg/dL (3 mmol/L). You get confused. You have trouble thinking clearly. You have trouble breathing. These symptoms may represent a serious problem that is an emergency. Do not wait to see if the symptoms will go away. Get medical help right away. Call your local emergency services (911 in the [place] Do not drive yourself to the hospital. Summary Diabetes mellitus is a chronic disease that occurs when the body does not properly use sugar (glucose) that is released from food after you eat. Take insulin and diabetes medicines as told. Check your blood glucose every day, as often as told. Keep all follow-up visits. This is important. This information is not intended to replace advice given to you by your health care provider. Make sure you discuss any questions you have with your health care provider. Document Revised: 07/07/2019 Document Reviewed: 07/07/2019 Elsevier Patient Education  2023 [employer]vier Inc.  

## 2022-04-23 NOTE — Telephone Encounter (Signed)
Unable to refill per protocol, Rx expired. Discontinued 03/14/22, not covered by insurance.  Requested Prescriptions  Pending Prescriptions Disp Refills   liraglutide (VICTOZA) 18 MG/3ML SOPN [Pharmacy Med Name: VICTOZA 3-PAK 18 MG/3 ML PEN]  4    Sig: ADMINISTER 1.2 MG UNDER THE SKIN DAILY     Endocrinology:  Diabetes - GLP-1 Receptor Agonists Passed - 04/21/2022  8:25 AM      Passed - HBA1C is between 0 and 7.9 and within 180 days    Hemoglobin A1C  Date Value Ref Range Status  11/12/2013 13.4 (H) 4.2 - 6.3 % Final    Comment:    The American Diabetes Association recommends that a primary goal of therapy should be <7% and that physicians should reevaluate the treatment regimen in patients with HbA1c values consistently >8%.    HB A1C (BAYER DCA - WAIVED)  Date Value Ref Range Status  03/14/2022 7.7 (H) 4.8 - 5.6 % Final    Comment:             Prediabetes: 5.7 - 6.4          Diabetes: >6.4          Glycemic control for adults with diabetes: <7.0          Passed - Valid encounter within last 6 months    Recent Outpatient Visits           1 month ago Type 2 diabetes mellitus with stage 3b chronic kidney disease, with long-term current use of insulin ([place])   [place], [place] T, NP   4 months ago Type 2 diabetes mellitus with stage 3b chronic kidney disease, with long-term current use of insulin ([place])   [place], [place] T, NP   7 months ago Type 2 diabetes mellitus with stage 3b chronic kidney disease, with long-term current use of insulin ([place])   [place] Chauncey, [place] T, NP   10 months ago Type 2 diabetes mellitus with stage 3b chronic kidney disease, with long-term current use of insulin ([place])   [place], [place] T, NP   1 year ago Type 2 diabetes mellitus with stage 3b chronic kidney disease, with long-term current use of insulin ([place])    [place], Barbaraann Faster, NP       Future Appointments             In 2 days Venita Lick, NP [place], [facility]

## 2022-04-24 ENCOUNTER — Other Ambulatory Visit: Payer: Self-pay | Admitting: Nurse Practitioner

## 2022-04-24 MED ORDER — ALLOPURINOL 100 MG PO TABS: 100.0000 mg | ORAL_TABLET | Freq: Every day | ORAL | 3 refills | Status: DC

## 2022-04-24 NOTE — Telephone Encounter (Signed)
Unable to reorder Insulin Degludec (TRESIBA FLEXTOUCH [place]) due to alternative needs selecting, sending to provider for refill.

## 2022-04-24 NOTE — Telephone Encounter (Signed)
Requested Prescriptions  Pending Prescriptions Disp Refills   allopurinol (ZYLOPRIM) 100 MG tablet 90 tablet 3    Sig: Take 1 tablet (100 mg total) by mouth daily.     Endocrinology:  Gout Agents - allopurinol Failed - 04/24/2022  3:54 PM      Failed - Cr in normal range and within 360 days    Creatinine  Date Value Ref Range Status  06/15/2014 1.07 (H) mg/dL Final    Comment:    0.44-1.00 NOTE: New Reference Range  05/25/14    Creatinine, Ser  Date Value Ref Range Status  03/14/2022 1.15 (H) 0.57 - 1.00 mg/dL Final         Passed - Uric Acid in normal range and within 360 days    Uric Acid  Date Value Ref Range Status  03/14/2022 4.9 3.0 - 7.2 mg/dL Final    Comment:               Therapeutic target for gout patients: <6.0         Passed - Valid encounter within last 12 months    Recent Outpatient Visits           1 month ago Type 2 diabetes mellitus with stage 3b chronic kidney disease, with long-term current use of insulin ([place])   [place], [place] T, NP   4 months ago Type 2 diabetes mellitus with stage 3b chronic kidney disease, with long-term current use of insulin ([place])   [place], [place] T, NP   7 months ago Type 2 diabetes mellitus with stage 3b chronic kidney disease, with long-term current use of insulin ([place])   [place], [place] T, NP   11 months ago Type 2 diabetes mellitus with stage 3b chronic kidney disease, with long-term current use of insulin ([place])   [place], [place] T, NP   1 year ago Type 2 diabetes mellitus with stage 3b chronic kidney disease, with long-term current use of insulin ([place])   [place], Barbaraann Faster, NP       Future Appointments             [place], Henrine Screws T, NP [place], [facility]            Passed - CBC within normal limits  and completed in the last 12 months    WBC  Date Value Ref Range Status  03/14/2022 8.6 3.4 - 10.8 x10E3/uL Final  01/18/2022 7.2 4.0 - 10.5 K/uL Final   RBC  Date Value Ref Range Status  03/14/2022 4.55 3.77 - 5.28 x10E6/uL Final  01/18/2022 4.44 3.87 - 5.11 MIL/uL Final   Hemoglobin  Date Value Ref Range Status  03/14/2022 14.1 11.1 - 15.9 g/dL Final   Hematocrit  Date Value Ref Range Status  03/14/2022 42.8 34.0 - 46.6 % Final   MCHC  Date Value Ref Range Status  03/14/2022 32.9 31.5 - 35.7 g/dL Final  01/18/2022 32.8 30.0 - 36.0 g/dL Final   [facility]  Date Value Ref Range Status  03/14/2022 31.0 26.6 - 33.0 pg Final  01/18/2022 30.4 26.0 - 34.0 pg Final   MCV  Date Value Ref Range Status  03/14/2022 94 79 - 97 fL Final  06/15/2014 91 80 - 100 fL Final   No results found for: "PLTCOUNTKUC", "LABPLAT", "POCPLA" RDW  Date Value Ref Range Status  03/14/2022 13.5 11.7 - 15.4 % Final  06/15/2014 14.0 11.5 - 14.5 % Final

## 2022-04-24 NOTE — Telephone Encounter (Signed)
Medication Refill - Medication: Insulin Degludec (TRESIBA FLEXTOUCH [place]), allopurinol (ZYLOPRIM) 100 MG tablet   Has the patient contacted their pharmacy? Yes.    (Agent: If yes, when and what did the pharmacy advise?)  Preferred Pharmacy (with phone number or street name):  [id number] [place] [place] [id number] Phone: [phone]: [phone] Hours: Not open 24 hours   Has the patient been seen for an appointment in the last year OR does the patient have an upcoming appointment? Yes.    Agent: Please be advised that RX refills may take up to 3 business days. We ask that you follow-up with your pharmacy.

## 2022-04-25 ENCOUNTER — Encounter: Payer: Self-pay | Admitting: Nurse Practitioner

## 2022-04-25 ENCOUNTER — Ambulatory Visit (INDEPENDENT_AMBULATORY_CARE_PROVIDER_SITE_OTHER): Payer: Medicare HMO | Admitting: Nurse Practitioner

## 2022-04-25 VITALS — BP 112/68 | HR 98 | Temp 97.6°F | Ht <= 58 in | Wt 157.8 lb

## 2022-04-25 DIAGNOSIS — E1122 Type 2 diabetes mellitus with diabetic chronic kidney disease: Secondary | ICD-10-CM

## 2022-04-25 DIAGNOSIS — Z794 Long term (current) use of insulin: Secondary | ICD-10-CM

## 2022-04-25 DIAGNOSIS — N1832 Chronic kidney disease, stage 3b: Secondary | ICD-10-CM | POA: Diagnosis not present

## 2022-04-25 NOTE — Telephone Encounter (Signed)
Future visit in 1 month. Requested Prescriptions  Pending Prescriptions Disp Refills   NOVOLOG FLEXPEN 100 UNIT/ML FlexPen [Pharmacy Med Name: NOVOLOG 100 UNIT/ML FLEXPEN] 15 mL 0    Sig: INJECT 12-14 UNITS UNDER THE SKIN 3 TIMES DAILY WITH MEALS     Endocrinology:  Diabetes - Insulins Passed - 04/24/2022  2:10 PM      Passed - HBA1C is between 0 and 7.9 and within 180 days    Hemoglobin A1C  Date Value Ref Range Status  11/12/2013 13.4 (H) 4.2 - 6.3 % Final    Comment:    The American Diabetes Association recommends that a primary goal of therapy should be <7% and that physicians should reevaluate the treatment regimen in patients with HbA1c values consistently >8%.    HB A1C (BAYER DCA - WAIVED)  Date Value Ref Range Status  03/14/2022 7.7 (H) 4.8 - 5.6 % Final    Comment:             Prediabetes: 5.7 - 6.4          Diabetes: >6.4          Glycemic control for adults with diabetes: <7.0          Passed - Valid encounter within last 6 months    Recent Outpatient Visits           Today Type 2 diabetes mellitus with stage 3b chronic kidney disease, with long-term current use of insulin ([place])   [place], [place] T, NP   1 month ago Type 2 diabetes mellitus with stage 3b chronic kidney disease, with long-term current use of insulin ([place])   [place] Latt[place], [place] T, NP   4 months ago Type 2 diabetes mellitus with stage 3b chronic kidney disease, with long-term current use of insulin ([place])   [place], [place] T, NP   7 months ago Type 2 diabetes mellitus with stage 3b chronic kidney disease, with long-term current use of insulin ([place])   [place], [place] T, NP   11 months ago Type 2 diabetes mellitus with stage 3b chronic kidney disease, with long-term current use of insulin ([place])   [place], Barbaraann Faster, NP        Future Appointments             In 1 month Cannady, Barbaraann Faster, NP [place], [facility]

## 2022-04-25 NOTE — Assessment & Plan Note (Signed)
Chronic, ongoing with A1c 7.7% December 2023, trend up due to diet, and urine ALB 10 (March 2023) -- recheck next visit.  At this time recommend she check sugars 2 hours after a meal as well to see if running higher at that time.  Has started Ozempic without issue, but not [place] yet due to not being in stock -- continue Levemir.  Continue current medication regimen and adjust as needed.  Goal in long run would be to reduce insulin, but at this time she is not a goal.  Eye and foot exam up to date.  Vaccines up to date.  Return in March.

## 2022-04-25 NOTE — Progress Notes (Signed)
BP 112/68   Pulse 98   Temp 97.6 F (36.4 C) (Oral)   Ht 4' 9.99" (1.473 m)   Wt 157 lb 12.8 oz (71.6 kg)   LMP  (LMP Unknown)   SpO2 95%   BMI 32.99 kg/m    Subjective:    Patient ID: Kara Leach, female    DOB: July 12, 1950, 72 y.o.   MRN: [id number]  HPI: Kara Leach is a 72 y.o. female  Chief Complaint  Patient presents with   Diabetes   DIABETES A1c 7.7% on 03/14/22 (had ate more pie during Christmas), had to change to [place] and Ozempic + stop Levemir and Victoza due to insurance -- has not started on Tresiba, as is on back order.  Not having any ADR with Ozempic.  Continues on [place] and [place] Glade (working with CCM on this). Hypoglycemic episodes:no Polydipsia/polyuria: no Visual disturbance: no Chest pain: no Paresthesias: no Glucose Monitoring: yes  Accucheck frequency: TID  Fasting glucose: 112 this morning  Post prandial:  Evening: 152 last night  Before meals: Taking Insulin?: yes  Long acting insulin: Levemir 47 units  Short acting insulin: Novolog 12 to 14 units Blood Pressure Monitoring: a few times a month Retinal Examination: Up to Date -- recent visit [facility] Foot Exam: Up to Date Pneumovax: Up to Date Influenza: Up to Date Aspirin: no  Relevant past medical, surgical, family and social history reviewed and updated as indicated. Interim medical history since our last visit reviewed. Allergies and medications reviewed and updated.  Review of Systems  Constitutional:  Negative for activity change, appetite change, diaphoresis, fatigue and fever.  Respiratory:  Negative for cough, chest tightness and shortness of breath.   Cardiovascular:  Negative for chest pain, palpitations and leg swelling.  Gastrointestinal: Negative.   Endocrine: Negative for cold intolerance, heat intolerance, polydipsia, polyphagia and polyuria.  Neurological: Negative.   Psychiatric/Behavioral: Negative.     Per HPI unless specifically indicated above      Objective:    BP 112/68   Pulse 98   Temp 97.6 F (36.4 C) (Oral)   Ht 4' 9.99" (1.473 m)   Wt 157 lb 12.8 oz (71.6 kg)   LMP  (LMP Unknown)   SpO2 95%   BMI 32.99 kg/m   Wt Readings from Last 3 Encounters:  04/25/22 157 lb 12.8 oz (71.6 kg)  03/14/22 152 lb 12.8 oz (69.3 kg)  01/25/22 152 lb (68.9 kg)    Physical Exam Vitals and nursing note reviewed.  Constitutional:      General: She is awake. She is not in acute distress.    Appearance: She is well-developed and well-groomed. She is obese. She is not ill-appearing.  HENT:     Head: Normocephalic.     Right Ear: Hearing normal.     Left Ear: Hearing normal.  Eyes:     General: Lids are normal.        Right eye: No discharge.        Left eye: No discharge.     Conjunctiva/sclera: Conjunctivae normal.     Pupils: Pupils are equal, round, and reactive to light.  Neck:     Thyroid: No thyromegaly.     Vascular: No carotid bruit.  Cardiovascular:     Rate and Rhythm: Normal rate and regular rhythm.     Heart sounds: Normal heart sounds. No murmur heard.    No gallop.     Comments:   Pulmonary:  Effort: Pulmonary effort is normal. No accessory muscle usage or respiratory distress.     Breath sounds: Normal breath sounds.  Abdominal:     General: Bowel sounds are normal.     Palpations: Abdomen is soft.     Tenderness: There is no abdominal tenderness.  Musculoskeletal:     Cervical back: Normal range of motion and neck supple.     Right lower leg: No edema.     Left lower leg: No edema.  Lymphadenopathy:     Cervical: No cervical adenopathy.  Skin:    General: Skin is warm and dry.  Neurological:     Mental Status: She is alert and oriented to person, place, and time.  Psychiatric:        Attention and Perception: Attention normal.        Mood and Affect: Mood normal.        Speech: Speech normal.        Behavior: Behavior normal. Behavior is cooperative.        Thought Content: Thought content normal.     Results for orders placed or performed in visit on 03/14/22  Microscopic Examination   Urine  Result Value Ref Range   WBC, UA 11-30 (A) 0 - 5 /hpf   RBC, Urine 0-2 0 - 2 /hpf   Epithelial Cells (non renal) 0-10 0 - 10 /hpf   Bacteria, UA Many (A) None seen/Few  Urine Culture   Specimen: Urine   UR  Result Value Ref Range   Urine Culture, Routine Final report (A)    Organism ID, Bacteria Klebsiella pneumoniae (A)    Antimicrobial Susceptibility Comment   Bayer DCA Hb A1c Waived  Result Value Ref Range   HB A1C (BAYER DCA - WAIVED) 7.7 (H) 4.8 - 5.6 %  Lipid Panel w/o Chol/HDL Ratio  Result Value Ref Range   Cholesterol, Total 128 100 - 199 mg/dL   Triglycerides 246 (H) 0 - 149 mg/dL   HDL 38 (L) >39 mg/dL   VLDL Cholesterol Cal 39 5 - 40 mg/dL   LDL Chol Calc (NIH) 51 0 - 99 mg/dL  Comprehensive metabolic panel  Result Value Ref Range   Glucose 164 (H) 70 - 99 mg/dL   BUN 18 8 - 27 mg/dL   Creatinine, Ser 1.15 (H) 0.57 - 1.00 mg/dL   eGFR 52 (L) >59 mL/min/1.73   BUN/Creatinine Ratio 16 12 - 28   Sodium 140 134 - 144 mmol/L   Potassium 4.5 3.5 - 5.2 mmol/L   Chloride 102 96 - 106 mmol/L   CO2 22 20 - 29 mmol/L   Calcium 10.3 8.7 - 10.3 mg/dL   Total Protein 7.3 6.0 - 8.5 g/dL   Albumin 4.5 3.9 - 4.9 g/dL   Globulin, Total 2.8 1.5 - 4.5 g/dL   Albumin/Globulin Ratio 1.6 1.2 - 2.2   Bilirubin Total 0.3 0.0 - 1.2 mg/dL   Alkaline Phosphatase 103 44 - 121 IU/L   AST 36 0 - 40 IU/L   ALT 22 0 - 32 IU/L  CBC with Differential/Platelet  Result Value Ref Range   WBC 8.6 3.4 - 10.8 x10E3/uL   RBC 4.55 3.77 - 5.28 x10E6/uL   Hemoglobin 14.1 11.1 - 15.9 g/dL   Hematocrit 42.8 34.0 - 46.6 %   MCV 94 79 - 97 fL   MCH 31.0 26.6 - 33.0 pg   MCHC 32.9 31.5 - 35.7 g/dL   RDW 13.5 11.7 - 15.4 %  Platelets 286 150 - 450 x10E3/uL   Neutrophils 42 Not Estab. %   Lymphs 46 Not Estab. %   Monocytes 8 Not Estab. %   Eos 3 Not Estab. %   Basos 1 Not Estab. %   Neutrophils  Absolute 3.6 1.4 - 7.0 x10E3/uL   Lymphocytes Absolute 4.0 (H) 0.7 - 3.1 x10E3/uL   Monocytes Absolute 0.6 0.1 - 0.9 x10E3/uL   EOS (ABSOLUTE) 0.3 0.0 - 0.4 x10E3/uL   Basophils Absolute 0.1 0.0 - 0.2 x10E3/uL   Immature Granulocytes 0 Not Estab. %   Immature Grans (Abs) 0.0 0.0 - 0.1 x10E3/uL  TSH  Result Value Ref Range   TSH 0.995 0.450 - 4.500 uIU/mL  VITAMIN D 25 Hydroxy (Vit-D Deficiency, Fractures)  Result Value Ref Range   Vit D, 25-Hydroxy 40.8 30.0 - 100.0 ng/mL  Uric acid  Result Value Ref Range   Uric Acid 4.9 3.0 - 7.2 mg/dL  Vitamin B12  Result Value Ref Range   Vitamin B-12 780 232 - 1,245 pg/mL  Urinalysis, Routine w reflex microscopic  Result Value Ref Range   Specific Gravity, UA <1.005 (L) 1.005 - 1.030   pH, UA 5.0 5.0 - 7.5   Color, UA Yellow Yellow   Appearance Ur Cloudy (A) Clear   Leukocytes,UA 2+ (A) Negative   Protein,UA Negative Negative/Trace   Glucose, UA 3+ (A) Negative   Ketones, UA Negative Negative   RBC, UA Negative Negative   Bilirubin, UA Negative Negative   Urobilinogen, Ur 0.2 0.2 - 1.0 mg/dL   Nitrite, UA Negative Negative   Microscopic Examination See below:       Assessment & Plan:   Problem List Items Addressed This Visit       Endocrine   Type 2 diabetes mellitus with stage 3b chronic kidney disease, with long-term current use of insulin ([place]) - Primary (Chronic)    Chronic, ongoing with A1c 7.7% December 2023, trend up due to diet, and urine ALB 10 (March 2023) -- recheck next visit.  At this time recommend she check sugars 2 hours after a meal as well to see if running higher at that time.  Has started Ozempic without issue, but not [place] yet due to not being in stock -- continue Levemir.  Continue current medication regimen and adjust as needed.  Goal in long run would be to reduce insulin, but at this time she is not a goal.  Eye and foot exam up to date.  Vaccines up to date.  Return in March.      Relevant Medications    Insulin Degludec (TRESIBA FLEXTOUCH [place])     Follow up plan: Return in about 7 weeks (around 06/14/2022) for T2DM, HTN/HLD, CKD, MGUS, MOOD.

## 2022-04-30 ENCOUNTER — Telehealth: Payer: Self-pay

## 2022-04-30 NOTE — Progress Notes (Cosign Needed)
Care Management & Coordination Services Pharmacy Team  Reason for Encounter: General adherence update   Contacted patient for general health update and medication adherence call.  Unsuccessful outreach. Left voicemail for patient to return call.  Recent office visits:  04/25/22-Jolene T. Cannady, NP (PCP) Seen for a diabetic follow up visit. Follow up in 7 weeks. 03/14/22-Jolene T. Ned Card, NP (PCP) Seen for a comprehensive medical examination. Labs ordered. Stop Levemir. Follow up in 6 weeks.  12/13/21-Jolene T. Ned Card, NP (PCP) Seen for a general follow up visit. Labs ordered. Start Augmentin 875-125 mg. Increase Levemir to 47 units and if sugars after meals are >180 increase Novolog to 14 units. Follow up in 3 months.   Recent consult visits:  03/27/22-Munsoor Holley Raring, MD (Nephrology) Seen for follow up visit. Follow up in 4 months.  01/25/22-Sarah M. Covington, PA-C (Oncology) Seen for Monoclonal gammopathy.  12/04/21-Gregory Schnier, MD (Vascular surgery) Seen for varicose veins hurt. Return for Patient's Convenience.  11/23/21-Munsoor Holley Raring, MD (Nephrology) Seen for follow up visit. Follow up in 4 months.   Hospital visits:  None in previous 6 months  Medications: Outpatient Encounter Medications as of 04/30/2022  Medication Sig   acetaminophen (TYLENOL) 650 MG CR tablet Take 650 mg by mouth every 8 (eight) hours as needed for pain.   allopurinol (ZYLOPRIM) 100 MG tablet Take 1 tablet (100 mg total) by mouth daily.   aspirin EC 81 MG tablet Take 81 mg by mouth daily.   BD PEN NEEDLE NANO U/F 32G X 4 MM MISC USE EVERY MORNING   Blood Glucose Monitoring Suppl (ONETOUCH VERIO REFLECT) w/Device KIT USE TO CHECK BLOOD SUGAR DAILY   Calcium Carb-Cholecalciferol (CALCIUM 600 + D PO) Take 1 tablet by mouth 2 (two) times daily. 1000 IU VITAMIN D   dapagliflozin propanediol (FARXIGA) 10 MG TABS tablet Take 10 mg by mouth daily.   enalapril (VASOTEC) 2.5 MG tablet Take 1 tablet (2.5 mg  total) by mouth daily.   fluticasone (FLONASE) 50 MCG/ACT nasal spray Place 2 sprays into both nostrils daily as needed for allergies.   glucose blood (ONETOUCH ULTRA) test strip USE TO CHECK BLOOD SUGAR TWICE DAILY   insulin aspart (NOVOLOG FLEXPEN) 100 UNIT/ML FlexPen INJECT 12-14 UNITS UNDER THE SKIN 3 TIMES DAILY WITH MEALS   Insulin Degludec (TRESIBA FLEXTOUCH [place]) Inject 47 Units into the skin at bedtime. (Patient not taking: Reported on 04/25/2022)   Insulin Pen Needle (B-D ULTRAFINE III SHORT PEN) 31G X 8 MM MISC 1 each by Does not apply route 3 (three) times daily.   Lancets (ONETOUCH DELICA PLUS Q000111Q) MISC Use to check blood sugars 3 to 4 times a day.   Loratadine (CLARITIN PO) Take 10 mg by mouth as needed.   MELATONIN PO Take by mouth as needed.   metoprolol succinate (TOPROL-XL) 25 MG 24 hr tablet Take 1 tablet (25 mg total) by mouth daily.   Semaglutide, 1 MG/DOSE, 4 MG/3ML SOPN Inject 1 mg as directed once a week.   sertraline (ZOLOFT) 100 MG tablet Take 1 tablet (100 mg total) by mouth every morning.   simvastatin (ZOCOR) 40 MG tablet TAKE 1 TABLET BY MOUTH EVERYDAY AT BEDTIME   Specialty Vitamins Products (VITA-RX DIABETIC VITAMIN) CAPS Take 1 packet by mouth daily. Diabetic Vitamins - 6 or 7 in 1 pack   No facility-administered encounter medications on file as of 04/30/2022.    Recent vitals BP Readings from Last 3 Encounters:  04/25/22 112/68  03/14/22 114/73  01/25/22 127/83  Pulse Readings from Last 3 Encounters:  04/25/22 98  03/14/22 82  01/25/22 91   Wt Readings from Last 3 Encounters:  04/25/22 157 lb 12.8 oz (71.6 kg)  03/14/22 152 lb 12.8 oz (69.3 kg)  01/25/22 152 lb (68.9 kg)   BMI Readings from Last 3 Encounters:  04/25/22 32.99 kg/m  03/14/22 31.94 kg/m  01/25/22 31.77 kg/m    Recent lab results    Component Value Date/Time   NA 140 03/14/2022 1008   NA 129 (L) 06/15/2014 1054   K 4.5 03/14/2022 1008   K 4.6 06/15/2014 1054   CL 102  03/14/2022 1008   CL 100 (L) 06/15/2014 1054   CO2 22 03/14/2022 1008   CO2 24 06/15/2014 1054   GLUCOSE 164 (H) 03/14/2022 1008   GLUCOSE 135 (H) 01/18/2022 1158   GLUCOSE 405 (H) 06/15/2014 1054   BUN 18 03/14/2022 1008   BUN 23 (H) 06/15/2014 1054   CREATININE 1.15 (H) 03/14/2022 1008   CREATININE 1.07 (H) 06/15/2014 1054   CALCIUM 10.3 03/14/2022 1008   CALCIUM 9.1 06/15/2014 1054    Lab Results  Component Value Date   CREATININE 1.15 (H) 03/14/2022   EGFR 52 (L) 03/14/2022   GFRNONAA 54 (L) 01/18/2022   GFRAA 49 (L) 02/25/2020   Lab Results  Component Value Date/Time   HGBA1C 7.7 (H) 03/14/2022 10:06 AM   HGBA1C 7.2 (H) 12/13/2021 09:30 AM   HGBA1C 13.4 (H) 11/12/2013 12:21 PM   MICROALBUR 10 05/29/2021 09:50 AM   MICROALBUR 30 (H) 02/27/2021 04:36 PM    Lab Results  Component Value Date   CHOL 128 03/14/2022   HDL 38 (L) 03/14/2022   LDLCALC 51 03/14/2022   TRIG 246 (H) 03/14/2022   CHOLHDL 3.9 11/13/2017   Care Gaps: Annual wellness visit in last year? Yes  If Diabetic: Last eye exam / retinopathy screening: Last diabetic foot exam: Last UACR:   Star Rating Drugs:  Farxiga 10 mg Last filled:N/A Novolog 100 units Last filled:04/25/22 35 DS Tresiba 47 units Last filled:N/A Simvastatin 40 mg Last filled:02/24/22 90 DS Ozempic 1 mg Last filled:04/25/22 30 DS    Myriam Elta [place], RMA

## 2022-05-07 ENCOUNTER — Encounter: Payer: Self-pay | Admitting: Nurse Practitioner

## 2022-05-15 ENCOUNTER — Other Ambulatory Visit: Payer: Self-pay | Admitting: Nurse Practitioner

## 2022-05-15 NOTE — Telephone Encounter (Signed)
Medication no longer on current medication list Requested Prescriptions  Pending Prescriptions Disp Refills   LEVEMIR FLEXPEN 100 UNIT/ML FlexPen [Pharmacy Med Name: LEVEMIR FLEXPEN 100 UNIT/ML]  11    Sig: INJECT 50 UNITS INTO THE SKIN AT BEDTIME.     Endocrinology:  Diabetes - Insulins Passed - 05/15/2022  1:49 AM      Passed - HBA1C is between 0 and 7.9 and within 180 days    Hemoglobin A1C  Date Value Ref Range Status  11/12/2013 13.4 (H) 4.2 - 6.3 % Final    Comment:    The American Diabetes Association recommends that a primary goal of therapy should be <7% and that physicians should reevaluate the treatment regimen in patients with HbA1c values consistently >8%.    HB A1C (BAYER DCA - WAIVED)  Date Value Ref Range Status  03/14/2022 7.7 (H) 4.8 - 5.6 % Final    Comment:             Prediabetes: 5.7 - 6.4          Diabetes: >6.4          Glycemic control for adults with diabetes: <7.0          Passed - Valid encounter within last 6 months    Recent Outpatient Visits           2 weeks ago Type 2 diabetes mellitus with stage 3b chronic kidney disease, with long-term current use of insulin ([place])   [place], [place] T, NP   2 months ago Type 2 diabetes mellitus with stage 3b chronic kidney disease, with long-term current use of insulin ([place])   [place], [place] T, NP   5 months ago Type 2 diabetes mellitus with stage 3b chronic kidney disease, with long-term current use of insulin ([place])   [place], [place] T, NP   8 months ago Type 2 diabetes mellitus with stage 3b chronic kidney disease, with long-term current use of insulin ([place])   [place], Jolene T, NP   11 months ago Type 2 diabetes mellitus with stage 3b chronic kidney disease, with long-term current use of insulin ([place])   [place], Barbaraann Faster, NP        Future Appointments             In 4 weeks Cannady, Barbaraann Faster, NP [place]

## 2022-05-23 ENCOUNTER — Telehealth: Payer: Self-pay

## 2022-05-23 NOTE — Progress Notes (Cosign Needed)
Care Management & Coordination Services Pharmacy Team  Reason for Encounter: General adherence update   Contacted patient for general health update and medication adherence call.  Unsuccessful outreach. Left voicemail for patient to return call.   Recent office visits:  04/25/22-Jolene T. Cannady, NP (PCP) Seen for a diabetic follow up visit. Follow up in 7 weeks. 03/14/22-Jolene T. Ned Card, NP (PCP) Seen for a comprehensive medical examination. Labs ordered. Stop Levemir. Follow up in 6 weeks.  12/13/21-Jolene T. Ned Card, NP (PCP) Seen for a general follow up visit. Labs ordered. Start Augmentin 875-125 mg. Increase Levemir to 47 units and if sugars after meals are >180 increase Novolog to 14 units. Follow up in 3 months.  Recent consult visits:  03/27/22-Munsoor Holley Raring, MD (Nephrology) Seen for follow up visit. Follow up in 4 months.  01/25/22-Sarah M. Covington, PA-C (Oncology) Seen for Monoclonal gammopathy.  12/04/21-Gregory Schnier, MD (Vascular surgery) Seen for varicose veins hurt. Return for Patient's Convenience.  11/23/21-Munsoor Holley Raring, MD (Nephrology) Seen for follow up visit. Follow up in 4 months.  Hospital visits:  None in previous 6 months  Medications: Outpatient Encounter Medications as of 05/23/2022  Medication Sig   acetaminophen (TYLENOL) 650 MG CR tablet Take 650 mg by mouth every 8 (eight) hours as needed for pain.   allopurinol (ZYLOPRIM) 100 MG tablet Take 1 tablet (100 mg total) by mouth daily.   aspirin EC 81 MG tablet Take 81 mg by mouth daily.   BD PEN NEEDLE NANO U/F 32G X 4 MM MISC USE EVERY MORNING   Blood Glucose Monitoring Suppl (ONETOUCH VERIO REFLECT) w/Device KIT USE TO CHECK BLOOD SUGAR DAILY   Calcium Carb-Cholecalciferol (CALCIUM 600 + D PO) Take 1 tablet by mouth 2 (two) times daily. 1000 IU VITAMIN D   dapagliflozin propanediol (FARXIGA) 10 MG TABS tablet Take 10 mg by mouth daily.   enalapril (VASOTEC) 2.5 MG tablet Take 1 tablet (2.5 mg  total) by mouth daily.   fluticasone (FLONASE) 50 MCG/ACT nasal spray Place 2 sprays into both nostrils daily as needed for allergies.   glucose blood (ONETOUCH ULTRA) test strip USE TO CHECK BLOOD SUGAR TWICE DAILY   insulin aspart (NOVOLOG FLEXPEN) 100 UNIT/ML FlexPen INJECT 12-14 UNITS UNDER THE SKIN 3 TIMES DAILY WITH MEALS   Insulin Degludec (TRESIBA FLEXTOUCH [place]) Inject 47 Units into the skin at bedtime. (Patient not taking: Reported on 04/25/2022)   Insulin Pen Needle (B-D ULTRAFINE III SHORT PEN) 31G X 8 MM MISC 1 each by Does not apply route 3 (three) times daily.   Lancets (ONETOUCH DELICA PLUS [id number]) MISC Use to check blood sugars 3 to 4 times a day.   Loratadine (CLARITIN PO) Take 10 mg by mouth as needed.   MELATONIN PO Take by mouth as needed.   metoprolol succinate (TOPROL-XL) 25 MG 24 hr tablet Take 1 tablet (25 mg total) by mouth daily.   Semaglutide, 1 MG/DOSE, 4 MG/3ML SOPN Inject 1 mg as directed once a week.   sertraline (ZOLOFT) 100 MG tablet Take 1 tablet (100 mg total) by mouth every morning.   simvastatin (ZOCOR) 40 MG tablet TAKE 1 TABLET BY MOUTH EVERYDAY AT BEDTIME   Specialty Vitamins Products (VITA-RX DIABETIC VITAMIN) CAPS Take 1 packet by mouth daily. Diabetic Vitamins - 6 or 7 in 1 pack   No facility-administered encounter medications on file as of 05/23/2022.    Recent vitals BP Readings from Last 3 Encounters:  04/25/22 112/68  03/14/22 114/73  01/25/22 127/83  Pulse Readings from Last 3 Encounters:  04/25/22 98  03/14/22 82  01/25/22 91   Wt Readings from Last 3 Encounters:  04/25/22 157 lb 12.8 oz (71.6 kg)  03/14/22 152 lb 12.8 oz (69.3 kg)  01/25/22 152 lb (68.9 kg)   BMI Readings from Last 3 Encounters:  04/25/22 32.99 kg/m  03/14/22 31.94 kg/m  01/25/22 31.77 kg/m    Recent lab results    Component Value Date/Time   NA 140 03/14/2022 1008   NA 129 (L) 06/15/2014 1054   K 4.5 03/14/2022 1008   K 4.6 06/15/2014 1054   CL 102  03/14/2022 1008   CL 100 (L) 06/15/2014 1054   CO2 22 03/14/2022 1008   CO2 24 06/15/2014 1054   GLUCOSE 164 (H) 03/14/2022 1008   GLUCOSE 135 (H) 01/18/2022 1158   GLUCOSE 405 (H) 06/15/2014 1054   BUN 18 03/14/2022 1008   BUN 23 (H) 06/15/2014 1054   CREATININE 1.15 (H) 03/14/2022 1008   CREATININE 1.07 (H) 06/15/2014 1054   CALCIUM 10.3 03/14/2022 1008   CALCIUM 9.1 06/15/2014 1054    Lab Results  Component Value Date   CREATININE 1.15 (H) 03/14/2022   EGFR 52 (L) 03/14/2022   GFRNONAA 54 (L) 01/18/2022   GFRAA 49 (L) 02/25/2020   Lab Results  Component Value Date/Time   HGBA1C 7.7 (H) 03/14/2022 10:06 AM   HGBA1C 7.2 (H) 12/13/2021 09:30 AM   HGBA1C 13.4 (H) 11/12/2013 12:21 PM   MICROALBUR 10 05/29/2021 09:50 AM   MICROALBUR 30 (H) 02/27/2021 04:36 PM    Lab Results  Component Value Date   CHOL 128 03/14/2022   HDL 38 (L) 03/14/2022   LDLCALC 51 03/14/2022   TRIG 246 (H) 03/14/2022   CHOLHDL 3.9 11/13/2017   Care Gaps: Annual wellness visit in last year? Yes  Star Rating Drugs:  Farxiga 10 mg Last filled:05/23/22 90 DS, 02/20/22 90 DS Novolog 100 units Last filled:04/25/22 35 DS, 03/28/22 36 DS Tresiba 47 units Last filled:N/a Ozempic 1 mg Last filled:04/25/22  30 DS, 03/14/22 30 DS Simvastatin 40 mg Last filled:02/24/22 90 DS, 11/25/21 90 DS  Myriam Elta [place], RMA

## 2022-06-02 ENCOUNTER — Other Ambulatory Visit: Payer: Self-pay | Admitting: Nurse Practitioner

## 2022-06-04 NOTE — Telephone Encounter (Signed)
Requested Prescriptions  Pending Prescriptions Disp Refills   NOVOLOG FLEXPEN 100 UNIT/ML FlexPen [Pharmacy Med Name: NOVOLOG 100 UNIT/ML FLEXPEN] 15 mL 0    Sig: INJECT 12-14 UNITS UNDER THE SKIN 3 TIMES DAILY WITH MEALS     Endocrinology:  Diabetes - Insulins Passed - 06/02/2022  8:37 AM      Passed - HBA1C is between 0 and 7.9 and within 180 days    Hemoglobin A1C  Date Value Ref Range Status  11/12/2013 13.4 (H) 4.2 - 6.3 % Final    Comment:    The American Diabetes Association recommends that a primary goal of therapy should be <7% and that physicians should reevaluate the treatment regimen in patients with HbA1c values consistently >8%.    HB A1C (BAYER DCA - WAIVED)  Date Value Ref Range Status  03/14/2022 7.7 (H) 4.8 - 5.6 % Final    Comment:             Prediabetes: 5.7 - 6.4          Diabetes: >6.4          Glycemic control for adults with diabetes: <7.0          Passed - Valid encounter within last 6 months    Recent Outpatient Visits           1 month ago Type 2 diabetes mellitus with stage 3b chronic kidney disease, with long-term current use of insulin ([place])   [place], [place] T, NP   2 months ago Type 2 diabetes mellitus with stage 3b chronic kidney disease, with long-term current use of insulin ([place])   [place], [place] T, NP   5 months ago Type 2 diabetes mellitus with stage 3b chronic kidney disease, with long-term current use of insulin ([place])   [place], [place] T, NP   8 months ago Type 2 diabetes mellitus with stage 3b chronic kidney disease, with long-term current use of insulin ([place])   [place], [place] T, NP   1 year ago Type 2 diabetes mellitus with stage 3b chronic kidney disease, with long-term current use of insulin ([place])   [place], Barbaraann Faster, NP       Future Appointments              In 1 week Cannady, Barbaraann Faster, NP [place], [facility]

## 2022-06-09 NOTE — Patient Instructions (Signed)
Be Involved in Your Health Care:  Taking Medications When medications are taken as directed, they can greatly improve your health. But if they are not taken as instructed, they may not work. In some cases, not taking them correctly can be harmful. To help ensure your treatment remains effective and safe, understand your medications and how to take them.  Your lab results, notes and after visit summary will be available on My Chart. We strongly encourage you to use this feature. If lab results are abnormal the clinic will contact you with the appropriate steps. If the clinic does not contact you assume the results are satisfactory. You can always see your results on My Chart. If you have questions regarding your condition, please contact the clinic during office hours. You can also ask questions on My Chart.  We at [facility] are grateful that you chose us to provide care. We strive to provide excellent and compassionate care and are always looking for feedback. If you get a survey from the clinic please complete this.   Diabetes Mellitus Basics  Diabetes mellitus, or diabetes, is a long-term (chronic) disease. It occurs when the body does not properly use sugar (glucose) that is released from food after you eat. Diabetes mellitus may be caused by one or both of these problems: Your pancreas does not make enough of a hormone called insulin. Your body does not react in a normal way to the insulin that it makes. Insulin lets glucose enter cells in your body. This gives you energy. If you have diabetes, glucose cannot get into cells. This causes high blood glucose (hyperglycemia). How to treat and manage diabetes You may need to take insulin or other diabetes medicines daily to keep your glucose in balance. If you are prescribed insulin, you will learn how to give yourself insulin by injection. You may need to adjust the amount of insulin you take based on the foods that you eat. You will  need to check your blood glucose levels using a glucose monitor as told by your health care provider. The readings can help determine if you have low or high blood glucose. Generally, you should have these blood glucose levels: Before meals (preprandial): 80-130 mg/dL (4.4-7.2 mmol/L). After meals (postprandial): below 180 mg/dL (10 mmol/L). Hemoglobin A1c (HbA1c) level: less than 7%. Your health care provider will set treatment goals for you. Keep all follow-up visits. This is important. Follow these instructions at home: Diabetes medicines Take your diabetes medicines every day as told by your health care provider. List your diabetes medicines here: Name of medicine: ______________________________ Amount (dose): _______________ Time (a.m./p.m.): _______________ Notes: ___________________________________ Name of medicine: ______________________________ Amount (dose): _______________ Time (a.m./p.m.): _______________ Notes: ___________________________________ Name of medicine: ______________________________ Amount (dose): _______________ Time (a.m./p.m.): _______________ Notes: ___________________________________ Insulin If you use insulin, list the types of insulin you use here: Insulin type: ______________________________ Amount (dose): _______________ Time (a.m./p.m.): _______________Notes: ___________________________________ Insulin type: ______________________________ Amount (dose): _______________ Time (a.m./p.m.): _______________ Notes: ___________________________________ Insulin type: ______________________________ Amount (dose): _______________ Time (a.m./p.m.): _______________ Notes: ___________________________________ Insulin type: ______________________________ Amount (dose): _______________ Time (a.m./p.m.): _______________ Notes: ___________________________________ Insulin type: ______________________________ Amount (dose): _______________ Time (a.m./p.m.): _______________  Notes: ___________________________________ Managing blood glucose  Check your blood glucose levels using a glucose monitor as told by your health care provider. Write down the times that you check your glucose levels here: Time: _______________ Notes: ___________________________________ Time: _______________ Notes: ___________________________________ Time: _______________ Notes: ___________________________________ Time: _______________ Notes: ___________________________________ Time: _______________ Notes: ___________________________________ Time: _______________ Notes: ___________________________________    Low blood glucose Low blood glucose (hypoglycemia) is when glucose is at or below 70 mg/dL (3.9 mmol/L). Symptoms may include: Feeling: Hungry. Sweaty and clammy. Irritable or easily upset. Dizzy. Sleepy. Having: A fast heartbeat. A headache. A change in your vision. Numbness around the mouth, lips, or tongue. Having trouble with: Moving (coordination). Sleeping. Treating low blood glucose To treat low blood glucose, eat or drink something containing sugar right away. If you can think clearly and swallow safely, follow the 15:15 rule: Take 15 grams of a fast-acting carb (carbohydrate), as told by your health care provider. Some fast-acting carbs are: Glucose tablets: take 3-4 tablets. Hard candy: eat 3-5 pieces. Fruit juice: drink 4 oz (120 mL). Regular (not diet) soda: drink 4-6 oz (120-180 mL). Honey or sugar: eat 1 Tbsp (15 mL). Check your blood glucose levels 15 minutes after you take the carb. If your glucose is still at or below 70 mg/dL (3.9 mmol/L), take 15 grams of a carb again. If your glucose does not go above 70 mg/dL (3.9 mmol/L) after 3 tries, get help right away. After your glucose goes back to normal, eat a meal or a snack within 1 hour. Treating very low blood glucose If your glucose is at or below 54 mg/dL (3 mmol/L), you have very low blood glucose  (severe hypoglycemia). This is an emergency. Do not wait to see if the symptoms will go away. Get medical help right away. Call your local emergency services (911 in the [place] Do not drive yourself to the hospital. Questions to ask your health care provider Should I talk with a diabetes educator? What equipment will I need to care for myself at home? What diabetes medicines do I need? When should I take them? How often do I need to check my blood glucose levels? What number can I call if I have questions? When is my follow-up visit? Where can I find a support group for people with diabetes? Where to find more information American Diabetes Association: www.diabetes.org Association of Diabetes Care and Education Specialists: www.diabeteseducator.org Contact a health care provider if: Your blood glucose is at or above 240 mg/dL (13.3 mmol/L) for 2 days in a row. You have been sick or have had a fever for 2 days or more, and you are not getting better. You have any of these problems for more than 6 hours: You cannot eat or drink. You feel nauseous. You vomit. You have diarrhea. Get help right away if: Your blood glucose is lower than 54 mg/dL (3 mmol/L). You get confused. You have trouble thinking clearly. You have trouble breathing. These symptoms may represent a serious problem that is an emergency. Do not wait to see if the symptoms will go away. Get medical help right away. Call your local emergency services (911 in the [place] Do not drive yourself to the hospital. Summary Diabetes mellitus is a chronic disease that occurs when the body does not properly use sugar (glucose) that is released from food after you eat. Take insulin and diabetes medicines as told. Check your blood glucose every day, as often as told. Keep all follow-up visits. This is important. This information is not intended to replace advice given to you by your health care provider. Make sure you discuss any  questions you have with your health care provider. Document Revised: 07/07/2019 Document Reviewed: 07/07/2019 Elsevier Patient Education  2023 Elsevier Inc.  

## 2022-06-13 ENCOUNTER — Ambulatory Visit (INDEPENDENT_AMBULATORY_CARE_PROVIDER_SITE_OTHER): Payer: Medicare HMO | Admitting: Nurse Practitioner

## 2022-06-13 ENCOUNTER — Encounter: Payer: Self-pay | Admitting: Nurse Practitioner

## 2022-06-13 VITALS — BP 125/80 | HR 83 | Temp 97.6°F | Ht <= 58 in | Wt 153.6 lb

## 2022-06-13 DIAGNOSIS — E1169 Type 2 diabetes mellitus with other specified complication: Secondary | ICD-10-CM | POA: Diagnosis not present

## 2022-06-13 DIAGNOSIS — E1159 Type 2 diabetes mellitus with other circulatory complications: Secondary | ICD-10-CM | POA: Diagnosis not present

## 2022-06-13 DIAGNOSIS — E1122 Type 2 diabetes mellitus with diabetic chronic kidney disease: Secondary | ICD-10-CM

## 2022-06-13 DIAGNOSIS — Z1231 Encounter for screening mammogram for malignant neoplasm of breast: Secondary | ICD-10-CM | POA: Diagnosis not present

## 2022-06-13 DIAGNOSIS — M8588 Other specified disorders of bone density and structure, other site: Secondary | ICD-10-CM | POA: Diagnosis not present

## 2022-06-13 DIAGNOSIS — E559 Vitamin D deficiency, unspecified: Secondary | ICD-10-CM

## 2022-06-13 DIAGNOSIS — Z794 Long term (current) use of insulin: Secondary | ICD-10-CM

## 2022-06-13 DIAGNOSIS — E785 Hyperlipidemia, unspecified: Secondary | ICD-10-CM | POA: Diagnosis not present

## 2022-06-13 DIAGNOSIS — N1832 Chronic kidney disease, stage 3b: Secondary | ICD-10-CM | POA: Diagnosis not present

## 2022-06-13 DIAGNOSIS — F324 Major depressive disorder, single episode, in partial remission: Secondary | ICD-10-CM

## 2022-06-13 DIAGNOSIS — I152 Hypertension secondary to endocrine disorders: Secondary | ICD-10-CM | POA: Diagnosis not present

## 2022-06-13 DIAGNOSIS — D472 Monoclonal gammopathy: Secondary | ICD-10-CM

## 2022-06-13 DIAGNOSIS — I83819 Varicose veins of unspecified lower extremities with pain: Secondary | ICD-10-CM | POA: Diagnosis not present

## 2022-06-13 DIAGNOSIS — R69 Illness, unspecified: Secondary | ICD-10-CM | POA: Diagnosis not present

## 2022-06-13 LAB — BAYER DCA HB A1C WAIVED: HB A1C (BAYER DCA - WAIVED): 7.3 % — ABNORMAL HIGH (ref 4.8–5.6)

## 2022-06-13 NOTE — Assessment & Plan Note (Signed)
Ongoing, stable.  Continue Vitamin D and calcium daily.  Check Vit D level today.  Repeat DEXA in July 2025.   

## 2022-06-13 NOTE — Assessment & Plan Note (Signed)
Chronic, ongoing.  Continue current medication regimen and adjust as needed. Lipid panel today. 

## 2022-06-13 NOTE — Assessment & Plan Note (Signed)
Ongoing with pain, continue collaboration with vascular.  Recommend she call and see if equipment available for surgery as was recommended.

## 2022-06-13 NOTE — Assessment & Plan Note (Signed)
Chronic, stable.  Continue collaboration with Dr. Finnegan at [facility] - recent notes and labs reviewed. 

## 2022-06-13 NOTE — Assessment & Plan Note (Signed)
Chronic, ongoing with A1c 7.3% today, trending down at this time, and urine ALB 10 (March 2023) -- recheck today.  Recommend she check sugars 2 hours after a meal as well to see if running higher at that time.  Continue current medication regimen and adjust as needed.  Educated her on insulin and to reduce [place] by 3 units if sugars consistently in 70-80 range.  Goal in long run would be to reduce insulin, but at this time she is not a goal -- could increase Ozempic to 2 MG next visit if remains above 7%.  Eye and foot exam up to date.  Vaccines up to date.  ACE and Statin on board.  Return in 3 months.

## 2022-06-13 NOTE — Assessment & Plan Note (Signed)
Chronic, stable.  BP remains at goal in office and at home for age.  Continue current medication regimen and adjust as needed.  Alert provider if BP consistently >130/80 at home and then may restart Amlodipine at 2.5 MG.  Continue Metoprolol and Enalapril and adjust as needed.  Focus on DASH diet.  Enalapril for kidney protection.  LABS: CMP, urine ALB.

## 2022-06-13 NOTE — Assessment & Plan Note (Signed)
Chronic, stable.  Denies SI/HI.  Continue current medication regimen, Sertraline 100 MG daily, and adjust as needed. Recommend meditation and relaxation techniques at home. 

## 2022-06-13 NOTE — Progress Notes (Signed)
BP 125/80   Pulse 83   Temp 97.6 F (36.4 C) (Oral)   Ht 4' 9.99" (1.473 m)   Wt 153 lb 9.6 oz (69.7 kg)   LMP  (LMP Unknown)   SpO2 98%   BMI 32.11 kg/m    Subjective:    Patient ID: Kara Leach, female    DOB: 04-24-1950, 72 y.o.   MRN: [id number]  HPI: Kara Leach is a 72 y.o. female  Chief Complaint  Patient presents with   Diabetes   Hypertension   Hyperlipidemia   Chronic Kidney Disease   Mood   Mgus   DIABETES Last A1c 7.7% December.  Due to insurance had to change medications and start [place] and Ozempic.  Continues on Farxiga 10 MG daily, Novolog, Tresiba, and Ozempic 1 MG weekly.  Was a little nauseous initially with change to [place].    [place] [employer] is no longer covers Levemir or Victoza = covers Ozempic and [place]. Hypoglycemic episodes:no Polydipsia/polyuria: no Visual disturbance: no Chest pain: no Paresthesias: no Glucose Monitoring: yes             Accucheck frequency: BID             Fasting glucose: 84 this morning -- <130 on average             Post prandial:             Evening: 155 last night, averaging 140 range             Before meals: Taking Insulin?: yes             Long acting insulin: Tresiba 47 units             Short acting insulin: Novolog 12 units morning/afternoon & 14 at dinner Blood Pressure Monitoring: daily Retinal Examination: Up To Date -- had it in February at [facility] Exam: Up to Date Pneumovax: Up to Date Influenza: Up to Date Aspirin: yes    HYPERTENSION / HYPERLIPIDEMIA Continues on Enalapril, Metoprolol, and ASA + Simvastatin.  Following with vascular, they postponed varicose vein surgery due to equipment needs.  Last visit 12/04/21.    Amlodipine taken in past, but discontinued due to stable BP levels. Satisfied with current treatment? yes Duration of hypertension: chronic BP monitoring frequency: daily BP range: <130-135/80 on average at home BP medication side effects: no Duration of  hyperlipidemia: chronic Cholesterol medication side effects: no Cholesterol supplements: none Medication compliance: good compliance Aspirin: yes Recent stressors: no Recurrent headaches: no Visual changes: no Palpitations: no Dyspnea: no Chest pain: no Lower extremity edema: occasional Dizzy/lightheaded: no The ASCVD Risk score (Arnett DK, et al., 2019) failed to calculate for the following reasons:   The patient has a prior MI or stroke diagnosis  CHRONIC KIDNEY DISEASE With GFR ranging from 39-60 over past two years.  Recent GFR 54.  Sees Dr. Holley Raring, last visit 03/27/22.  January labs noted CRT 1.29 and eGFR 45. CKD status: stable Medications renally dose: yes Previous renal evaluation: no Pneumovax:  Up to Date Influenza Vaccine:  Up to Date    Latest Ref Rng & Units 03/14/2022   10:08 AM 01/18/2022   11:58 AM 02/27/2021   10:03 AM  BMP  Glucose 70 - 99 mg/dL 164  135  126   BUN 8 - 27 mg/dL 18  18  18    Creatinine 0.57 - 1.00 mg/dL 1.15  1.10  1.25   BUN/Creat Ratio 12 -  28 16   14    Sodium 134 - 144 mmol/L 140  138  138   Potassium 3.5 - 5.2 mmol/L 4.5  4.3  4.7   Chloride 96 - 106 mmol/L 102  105  100   CO2 20 - 29 mmol/L 22  24  24    Calcium 8.7 - 10.3 mg/dL 10.3  9.8  9.4     OSTEOPENIA: Taking daily calcium and Vitamin D supplements.  Noted on DEXA in July 2020.   Satisfied with current treatment?: yes Adequate calcium & vitamin D: yes Weight bearing exercises: yes    MGUS: Followed by providers at cancer center and last seen 01/25/22.  To continue yearly visits and labs with them. Progressing slowly and they are monitoring.     DEPRESSION Continues on Sertraline 100 MG.  Winter months and holidays are a little tougher.    Mood status: stable Satisfied with current treatment?: yes Symptom severity: moderate  Duration of current treatment : chronic Side effects: no Medication compliance: good compliance Depressed mood: occasional Anxious mood:  occasional Anhedonia: no Significant weight loss or gain: no Insomnia: occasional Fatigue: no Feelings of worthlessness or guilt: no Impaired concentration/indecisiveness: no Suicidal ideations: no Hopelessness: no Crying spells: no    06/13/2022    9:57 AM 04/25/2022   10:59 AM 03/14/2022   10:06 AM 12/13/2021    9:30 AM 10/04/2021   11:15 AM  Depression screen PHQ 2/9  Decreased Interest 1 1 1 1  0  Down, Depressed, Hopeless 1 1 1 2 1   PHQ - 2 Score 2 2 2 3 1   Altered sleeping 2 2 1 2 1   Tired, decreased energy 1 2 1 2    Change in appetite 1 1 1  0 1  Feeling bad or failure about yourself  1 1 1 2  0  Trouble concentrating 0 0 0 0 0  Moving slowly or fidgety/restless 0 0 0 0 0  Suicidal thoughts 0 0 0 0 0  PHQ-9 Score 7 8 6 9 3   Difficult doing work/chores Somewhat difficult Somewhat difficult Somewhat difficult Somewhat difficult Not difficult at all       06/13/2022    9:58 AM 04/25/2022   11:00 AM 03/14/2022   10:06 AM 12/13/2021    9:31 AM  GAD 7 : Generalized Anxiety Score  Nervous, Anxious, on Edge 1 1 1  0  Control/stop worrying 1 1 1 1   Worry too much - different things 1 1 1 2   Trouble relaxing 1 1 1  0  Restless 0 0 1 0  Easily annoyed or irritable 1 0 0 2  Afraid - awful might happen 1 1 1 2   Total GAD 7 Score 6 5 6 7   Anxiety Difficulty Somewhat difficult Somewhat difficult Somewhat difficult Somewhat difficult      Relevant past medical, surgical, family and social history reviewed and updated as indicated. Interim medical history since our last visit reviewed. Allergies and medications reviewed and updated.  Review of Systems  Constitutional:  Negative for activity change, appetite change, diaphoresis, fatigue and fever.  Respiratory:  Negative for cough, chest tightness and shortness of breath.   Cardiovascular:  Negative for chest pain, palpitations and leg swelling.  Gastrointestinal: Negative.   Endocrine: Negative for cold intolerance, heat  intolerance, polydipsia, polyphagia and polyuria.  Neurological: Negative.   Psychiatric/Behavioral: Negative.      Per HPI unless specifically indicated above     Objective:    BP 125/80   Pulse 83  Temp 97.6 F (36.4 C) (Oral)   Ht 4' 9.99" (1.473 m)   Wt 153 lb 9.6 oz (69.7 kg)   LMP  (LMP Unknown)   SpO2 98%   BMI 32.11 kg/m   Wt Readings from Last 3 Encounters:  06/13/22 153 lb 9.6 oz (69.7 kg)  04/25/22 157 lb 12.8 oz (71.6 kg)  03/14/22 152 lb 12.8 oz (69.3 kg)    Physical Exam Vitals and nursing note reviewed.  Constitutional:      General: She is awake. She is not in acute distress.    Appearance: She is well-developed and well-groomed. She is obese. She is not ill-appearing.  HENT:     Head: Normocephalic.     Right Ear: Hearing normal.     Left Ear: Hearing normal.  Eyes:     General: Lids are normal.        Right eye: No discharge.        Left eye: No discharge.     Conjunctiva/sclera: Conjunctivae normal.     Pupils: Pupils are equal, round, and reactive to light.  Neck:     Thyroid: No thyromegaly.     Vascular: No carotid bruit.  Cardiovascular:     Rate and Rhythm: Normal rate and regular rhythm.     Heart sounds: Normal heart sounds. No murmur heard.    No gallop.     Comments:   Pulmonary:     Effort: Pulmonary effort is normal. No accessory muscle usage or respiratory distress.     Breath sounds: Normal breath sounds.  Abdominal:     General: Bowel sounds are normal.     Palpations: Abdomen is soft.     Tenderness: There is no abdominal tenderness.  Musculoskeletal:     Cervical back: Normal range of motion and neck supple.     Right lower leg: No edema.     Left lower leg: No edema.  Lymphadenopathy:     Cervical: No cervical adenopathy.  Skin:    General: Skin is warm and dry.  Neurological:     Mental Status: She is alert and oriented to person, place, and time.  Psychiatric:        Attention and Perception: Attention normal.         Mood and Affect: Mood normal.        Speech: Speech normal.        Behavior: Behavior normal. Behavior is cooperative.        Thought Content: Thought content normal.     Results for orders placed or performed in visit on 03/14/22  Microscopic Examination   Urine  Result Value Ref Range   WBC, UA 11-30 (A) 0 - 5 /hpf   RBC, Urine 0-2 0 - 2 /hpf   Epithelial Cells (non renal) 0-10 0 - 10 /hpf   Bacteria, UA Many (A) None seen/Few  Urine Culture   Specimen: Urine   UR  Result Value Ref Range   Urine Culture, Routine Final report (A)    Organism ID, Bacteria Klebsiella pneumoniae (A)    Antimicrobial Susceptibility Comment   Bayer DCA Hb A1c Waived  Result Value Ref Range   HB A1C (BAYER DCA - WAIVED) 7.7 (H) 4.8 - 5.6 %  Lipid Panel w/o Chol/HDL Ratio  Result Value Ref Range   Cholesterol, Total 128 100 - 199 mg/dL   Triglycerides 246 (H) 0 - 149 mg/dL   HDL 38 (L) >39 mg/dL   VLDL Cholesterol Cal  39 5 - 40 mg/dL   LDL Chol Calc (NIH) 51 0 - 99 mg/dL  Comprehensive metabolic panel  Result Value Ref Range   Glucose 164 (H) 70 - 99 mg/dL   BUN 18 8 - 27 mg/dL   Creatinine, Ser 1.15 (H) 0.57 - 1.00 mg/dL   eGFR 52 (L) >59 mL/min/1.73   BUN/Creatinine Ratio 16 12 - 28   Sodium 140 134 - 144 mmol/L   Potassium 4.5 3.5 - 5.2 mmol/L   Chloride 102 96 - 106 mmol/L   CO2 22 20 - 29 mmol/L   Calcium 10.3 8.7 - 10.3 mg/dL   Total Protein 7.3 6.0 - 8.5 g/dL   Albumin 4.5 3.9 - 4.9 g/dL   Globulin, Total 2.8 1.5 - 4.5 g/dL   Albumin/Globulin Ratio 1.6 1.2 - 2.2   Bilirubin Total 0.3 0.0 - 1.2 mg/dL   Alkaline Phosphatase 103 44 - 121 IU/L   AST 36 0 - 40 IU/L   ALT 22 0 - 32 IU/L  CBC with Differential/Platelet  Result Value Ref Range   WBC 8.6 3.4 - 10.8 x10E3/uL   RBC 4.55 3.77 - 5.28 x10E6/uL   Hemoglobin 14.1 11.1 - 15.9 g/dL   Hematocrit 42.8 34.0 - 46.6 %   MCV 94 79 - 97 fL   MCH 31.0 26.6 - 33.0 pg   MCHC 32.9 31.5 - 35.7 g/dL   RDW 13.5 11.7 - 15.4 %    Platelets 286 150 - 450 x10E3/uL   Neutrophils 42 Not Estab. %   Lymphs 46 Not Estab. %   Monocytes 8 Not Estab. %   Eos 3 Not Estab. %   Basos 1 Not Estab. %   Neutrophils Absolute 3.6 1.4 - 7.0 x10E3/uL   Lymphocytes Absolute 4.0 (H) 0.7 - 3.1 x10E3/uL   Monocytes Absolute 0.6 0.1 - 0.9 x10E3/uL   EOS (ABSOLUTE) 0.3 0.0 - 0.4 x10E3/uL   Basophils Absolute 0.1 0.0 - 0.2 x10E3/uL   Immature Granulocytes 0 Not Estab. %   Immature Grans (Abs) 0.0 0.0 - 0.1 x10E3/uL  TSH  Result Value Ref Range   TSH 0.995 0.450 - 4.500 uIU/mL  VITAMIN D 25 Hydroxy (Vit-D Deficiency, Fractures)  Result Value Ref Range   Vit D, 25-Hydroxy 40.8 30.0 - 100.0 ng/mL  Uric acid  Result Value Ref Range   Uric Acid 4.9 3.0 - 7.2 mg/dL  Vitamin B12  Result Value Ref Range   Vitamin B-12 780 232 - 1,245 pg/mL  Urinalysis, Routine w reflex microscopic  Result Value Ref Range   Specific Gravity, UA <1.005 (L) 1.005 - 1.030   pH, UA 5.0 5.0 - 7.5   Color, UA Yellow Yellow   Appearance Ur Cloudy (A) Clear   Leukocytes,UA 2+ (A) Negative   Protein,UA Negative Negative/Trace   Glucose, UA 3+ (A) Negative   Ketones, UA Negative Negative   RBC, UA Negative Negative   Bilirubin, UA Negative Negative   Urobilinogen, Ur 0.2 0.2 - 1.0 mg/dL   Nitrite, UA Negative Negative   Microscopic Examination See below:       Assessment & Plan:   Problem List Items Addressed This Visit       Cardiovascular and Mediastinum   Hypertension associated with diabetes (HCC) (Chronic)    Chronic, stable.  BP remains at goal in office and at home for age.  Continue current medication regimen and adjust as needed.  Alert provider if BP consistently >130/80 at home and then may restart  Amlodipine at 2.5 MG.  Continue Metoprolol and Enalapril and adjust as needed.  Focus on DASH diet.  Enalapril for kidney protection.  LABS: CMP, urine ALB.       Relevant Orders   Bayer DCA Hb A1c Waived   Microalbumin, Urine Waived      Endocrine   Hyperlipidemia associated with type 2 diabetes mellitus (HCC) (Chronic)    Chronic, ongoing.  Continue current medication regimen and adjust as needed.  Lipid panel today.      Relevant Orders   Bayer DCA Hb A1c Waived   Comprehensive metabolic panel   Lipid Panel w/o Chol/HDL Ratio   Type 2 diabetes mellitus with stage 3b chronic kidney disease, with long-term current use of insulin (HCC) - Primary (Chronic)    Chronic, ongoing with A1c 7.3% today, trending down at this time, and urine ALB 10 (March 2023) -- recheck today.  Recommend she check sugars 2 hours after a meal as well to see if running higher at that time.  Continue current medication regimen and adjust as needed.  Educated her on insulin and to reduce [place] by 3 units if sugars consistently in 70-80 range.  Goal in long run would be to reduce insulin, but at this time she is not a goal -- could increase Ozempic to 2 MG next visit if remains above 7%.  Eye and foot exam up to date.  Vaccines up to date.  ACE and Statin on board.  Return in 3 months.      Relevant Orders   Bayer DCA Hb A1c Waived   Microalbumin, Urine Waived     Musculoskeletal and Integument   Osteopenia of spine (Chronic)    Ongoing, stable.  Continue Vitamin D and calcium daily.  Check Vit D level today.  Repeat DEXA in July 2025.          Other   MGUS (monoclonal gammopathy of unknown significance) (Chronic)    Chronic, stable.  Continue collaboration with Dr. Grayland Ormond at [facility] - recent notes and labs reviewed.      Depression, major, single episode, in partial remission (HCC)    Chronic, stable.  Denies SI/HI.  Continue current medication regimen, Sertraline 100 MG daily, and adjust as needed. Recommend meditation and relaxation techniques at home.      Pain due to varicose veins of lower extremity    Ongoing with pain, continue collaboration with vascular.  Recommend she call and see if equipment available for surgery as was  recommended.      Vitamin D deficiency   Other Visit Diagnoses     Encounter for screening mammogram for malignant neoplasm of breast       Mammogram ordered and instructed on how to schedule.   Relevant Orders   MM 3D SCREENING MAMMOGRAM BILATERAL BREAST        Follow up plan: Return in about 3 months (around 09/13/2022) for T2DM, HTN/HLD, MGUS, GERD, MOOD.

## 2022-06-14 ENCOUNTER — Encounter: Payer: Self-pay | Admitting: Nurse Practitioner

## 2022-06-14 LAB — COMPREHENSIVE METABOLIC PANEL
ALT: 25 IU/L (ref 0–32)
AST: 60 IU/L — ABNORMAL HIGH (ref 0–40)
Albumin/Globulin Ratio: 1.4 (ref 1.2–2.2)
Albumin: 4.2 g/dL (ref 3.9–4.9)
Alkaline Phosphatase: 109 IU/L (ref 44–121)
BUN/Creatinine Ratio: 11 — ABNORMAL LOW (ref 12–28)
BUN: 13 mg/dL (ref 8–27)
Bilirubin Total: <0.2 mg/dL (ref 0.0–1.2)
CO2: 24 mmol/L (ref 20–29)
Calcium: 9.6 mg/dL (ref 8.7–10.3)
Chloride: 104 mmol/L (ref 96–106)
Creatinine, Ser: 1.15 mg/dL — ABNORMAL HIGH (ref 0.57–1.00)
Globulin, Total: 2.9 g/dL (ref 1.5–4.5)
Glucose: 93 mg/dL (ref 70–99)
Potassium: 4.4 mmol/L (ref 3.5–5.2)
Sodium: 144 mmol/L (ref 134–144)
Total Protein: 7.1 g/dL (ref 6.0–8.5)
eGFR: 52 mL/min/{1.73_m2} — ABNORMAL LOW (ref >59)

## 2022-06-14 LAB — LIPID PANEL W/O CHOL/HDL RATIO
Cholesterol, Total: 126 mg/dL (ref 100–199)
HDL: 40 mg/dL (ref >39)
LDL Chol Calc (NIH): 55 mg/dL (ref 0–99)
Triglycerides: 191 mg/dL — ABNORMAL HIGH (ref 0–149)
VLDL Cholesterol Cal: 31 mg/dL (ref 5–40)

## 2022-06-14 LAB — MICROALBUMIN, URINE WAIVED
Creatinine, Urine Waived: 50 mg/dL (ref 10–300)
Microalb, Ur Waived: 10 mg/L (ref 0–19)

## 2022-06-15 NOTE — Progress Notes (Signed)
Good afternoon, please let Kara Leach know her labs have returned and remain at baseline for her.  No medication changes needed.  Liver function does show mild elevation in AST, if taking any Tylenol try to cut back on this.  We will recheck next visit.  Any questions? Keep being amazing!!  Thank you for allowing me to participate in your care.  I appreciate you. Kindest regards, Cordae Mccarey

## 2022-06-29 ENCOUNTER — Encounter: Payer: Self-pay | Admitting: Intensive Care

## 2022-06-29 ENCOUNTER — Emergency Department: Payer: Medicare HMO

## 2022-06-29 ENCOUNTER — Other Ambulatory Visit: Payer: Self-pay

## 2022-06-29 ENCOUNTER — Emergency Department
Admission: EM | Admit: 2022-06-29 | Discharge: 2022-06-29 | Disposition: A | Discharge status: Home / Self Care | Payer: Medicare HMO | Attending: Emergency Medicine | Admitting: Emergency Medicine

## 2022-06-29 DIAGNOSIS — Z23 Encounter for immunization: Secondary | ICD-10-CM | POA: Insufficient documentation

## 2022-06-29 DIAGNOSIS — S0101XA Laceration without foreign body of scalp, initial encounter: Secondary | ICD-10-CM | POA: Diagnosis not present

## 2022-06-29 DIAGNOSIS — N189 Chronic kidney disease, unspecified: Secondary | ICD-10-CM | POA: Insufficient documentation

## 2022-06-29 DIAGNOSIS — W06XXXA Fall from bed, initial encounter: Secondary | ICD-10-CM | POA: Insufficient documentation

## 2022-06-29 DIAGNOSIS — I129 Hypertensive chronic kidney disease with stage 1 through stage 4 chronic kidney disease, or unspecified chronic kidney disease: Secondary | ICD-10-CM | POA: Diagnosis not present

## 2022-06-29 DIAGNOSIS — E1122 Type 2 diabetes mellitus with diabetic chronic kidney disease: Secondary | ICD-10-CM | POA: Diagnosis not present

## 2022-06-29 DIAGNOSIS — Y92003 Bedroom of unspecified non-institutional (private) residence as the place of occurrence of the external cause: Secondary | ICD-10-CM | POA: Insufficient documentation

## 2022-06-29 DIAGNOSIS — S0990XA Unspecified injury of head, initial encounter: Secondary | ICD-10-CM | POA: Diagnosis not present

## 2022-06-29 DIAGNOSIS — W19XXXA Unspecified fall, initial encounter: Secondary | ICD-10-CM

## 2022-06-29 LAB — CBG MONITORING, ED: Glucose-Capillary: 99 mg/dL (ref 70–99)

## 2022-06-29 MED ORDER — LIDOCAINE-EPINEPHRINE-TETRACAINE (LET) TOPICAL GEL
3.0000 mL | Freq: Once | TOPICAL | Status: AC
  Administered 2022-06-29: 3 mL via TOPICAL
  Filled 2022-06-29: qty 3

## 2022-06-29 MED ORDER — TETANUS-DIPHTH-ACELL PERTUSSIS 5-2.5-18.5 LF-MCG/0.5 IM SUSY
0.5000 mL | PREFILLED_SYRINGE | Freq: Once | INTRAMUSCULAR | Status: AC
  Administered 2022-06-29: 0.5 mL via INTRAMUSCULAR
  Filled 2022-06-29: qty 0.5

## 2022-06-29 NOTE — ED Triage Notes (Signed)
Patient had mechanical fall this am and hit her head on nightstand. Denies LOC.

## 2022-06-29 NOTE — Discharge Instructions (Signed)
Follow-up with your regular doctor, go to the urgent care, or return the emergency department for staple removal in 10 days. Continue all of your regular medications Be careful when you brush or comb your hair as it may catch on the staples.

## 2022-06-29 NOTE — ED Notes (Signed)
ED Provider at bedside. 

## 2022-06-29 NOTE — ED Provider Notes (Signed)
[facility] Provider Note    Event Date/Time   First MD Initiated Contact with Patient 06/29/22 1131     (approximate)   History   Fall   HPI  Kara Leach is a 72 y.o. female with history of hypertension, seizures, diabetes, CKD presents emergency department after a fall.  Patient states she fell out of her bed this morning hit her head on the nightstand.  Has not eaten today and is starting to feel shaky.  Some dizziness but states it is from not eating this morning.      Physical Exam   Triage Vital Signs: ED Triage Vitals  Enc Vitals Group     BP 06/29/22 0958 (!) 154/80     Pulse Rate 06/29/22 0958 80     Resp 06/29/22 0958 18     Temp 06/29/22 0958 97.6 F (36.4 C)     Temp Source 06/29/22 0958 Oral     SpO2 06/29/22 0958 95 %     Weight 06/29/22 1003 153 lb (69.4 kg)     Height 06/29/22 1003 4' 10.5" (1.486 m)     Head Circumference --      Peak Flow --      Pain Score 06/29/22 1003 6     Pain Loc --      Pain Edu? --      Excl. in GC? --     Most recent vital signs: Vitals:   06/29/22 0958  BP: (!) 154/80  Pulse: 80  Resp: 18  Temp: 97.6 F (36.4 C)  SpO2: 95%     General: Awake, no distress.   CV:  Good peripheral perfusion. regular rate and  rhythm Resp:  Normal effort. Lungs cta Abd:  No distention.   Other:  Large lump noted on the right side of skull, 3 cm laceration noted, cranial nerves II through XII grossly intact, grips equal bilaterally, no nystagmus noted   ED Results / Procedures / Treatments   Labs (all labs ordered are listed, but only abnormal results are displayed) Labs Reviewed  CBG MONITORING, ED     EKG     RADIOLOGY CT of the head    PROCEDURES:   .Marland KitchenLaceration Repair  Date/Time: 06/29/2022 12:18 PM  Performed by: Faythe Ghee, PA-C Authorized by: Faythe Ghee, PA-C   Consent:    Consent obtained:  Verbal   Consent given by:  Patient   Risks, benefits, and  alternatives were discussed: yes     Risks discussed:  Infection, pain, retained foreign body, poor cosmetic result, poor wound healing, vascular damage, nerve damage and need for additional repair   Alternatives discussed:  Delayed treatment Universal protocol:    Procedure explained and questions answered to patient or proxy's satisfaction: yes     Immediately prior to procedure, a time out was called: yes     Patient identity confirmed:  Verbally with patient Anesthesia:    Anesthesia method:  Topical application Laceration details:    Location:  Scalp   Scalp location:  R parietal   Length (cm):  3 Pre-procedure details:    Preparation:  Patient was prepped and draped in usual sterile fashion and imaging obtained to evaluate for foreign bodies Exploration:    Hemostasis achieved with:  LET   Imaging outcome: foreign body not noted     Wound exploration: entire depth of wound visualized     Wound extent: areolar tissue not violated, fascia not violated,  no foreign body, no signs of injury, no nerve damage, no tendon damage, no underlying fracture and no vascular damage     Contaminated: no   Treatment:    Area cleansed with:  Saline   Amount of cleaning:  Standard   Irrigation solution:  Sterile saline   Irrigation method:  Tap   Visualized foreign bodies/material removed: no   Skin repair:    Repair method:  Staples   Number of staples:  3 Approximation:    Approximation:  Close Repair type:    Repair type:  Simple Post-procedure details:    Dressing:  Non-adherent dressing   Procedure completion:  Tolerated well, no immediate complications    MEDICATIONS ORDERED IN ED: Medications  lidocaine-EPINEPHrine-tetracaine (LET) topical gel (3 mLs Topical Given 06/29/22 1151)  Tdap (BOOSTRIX) injection 0.5 mL (0.5 mLs Intramuscular Given 06/29/22 1153)     IMPRESSION / MDM / ASSESSMENT AND PLAN / ED COURSE  I reviewed the triage vital signs and the nursing notes.                               Differential diagnosis includes, but is not limited to, subdural, SAH, concussion, fracture, laceration  Patient's presentation is most consistent with acute presentation with potential threat to life or bodily function.   CT of the head was independently viewed and interpreted by me as being negative for any acute abnormality.  Tdap was updated.  Fingerstick glucose is normal, patient states that the dizziness resolved after being gave her some food  See laceration repair  Did explain everything to the patient.  She is to return emergency department if worsening symptoms.  Since the dizziness and symptoms have resolved with food I do not feel that she has need for an MRI at this time.  However she was given strict instructions to return emergency department if worsening.  She is in agreement treatment plan.  Staples should be removed in 10 days.  Discharged in stable condition.      FINAL CLINICAL IMPRESSION(S) / ED DIAGNOSES   Final diagnoses:  Fall, initial encounter  Injury of head, initial encounter  Laceration of scalp, initial encounter     Rx / DC Orders   ED Discharge Orders     None        Note:  This document was prepared using Dragon voice recognition software and may include unintentional dictation errors.    Faythe Ghee, PA-C 06/29/22 1225    Pilar Jarvis, MD 06/29/22 [phone]

## 2022-07-03 ENCOUNTER — Telehealth: Payer: Self-pay

## 2022-07-03 NOTE — Telephone Encounter (Signed)
        Patient  visited [facility] on 06/29/2022  for fall.   Telephone encounter attempt :  1st  A HIPAA compliant voice message was left requesting a return call.  Instructed patient to call back at [phone].   [facility]  [facility] Population Health Community Resource Care Guide   [email or web address]  ?? [phone]   Website: [email or web address]  [email or web address]

## 2022-07-04 ENCOUNTER — Telehealth: Payer: Self-pay

## 2022-07-04 NOTE — Telephone Encounter (Signed)
        Patient  visited  [facility] on 06/29/2022  for fall.   Telephone encounter attempt :  1st  A HIPAA compliant voice message was left requesting a return call.  Instructed patient to call back at [phone].   [facility] B[facility] Valentine  [facility] Population Health Community Resource Care Guide   [email or web address]  ?? [phone]   Website: [email or web address]  [email or web address]    

## 2022-07-07 ENCOUNTER — Other Ambulatory Visit: Payer: Self-pay | Admitting: Nurse Practitioner

## 2022-07-09 ENCOUNTER — Ambulatory Visit: Payer: Medicare HMO | Admitting: Family Medicine

## 2022-07-09 NOTE — Telephone Encounter (Signed)
Requested Prescriptions  Pending Prescriptions Disp Refills   NOVOLOG FLEXPEN 100 UNIT/ML FlexPen [Pharmacy Med Name: NOVOLOG 100 UNIT/ML FLEXPEN] 15 mL     Sig: INJECT 12-14 UNITS UNDER THE SKIN 3 TIMES DAILY WITH MEALS     Endocrinology:  Diabetes - Insulins Passed - 07/07/2022  9:02 AM      Passed - HBA1C is between 0 and 7.9 and within 180 days    Hemoglobin A1C  Date Value Ref Range Status  11/12/2013 13.4 (H) 4.2 - 6.3 % Final    Comment:    The American Diabetes Association recommends that a primary goal of therapy should be <7% and that physicians should reevaluate the treatment regimen in patients with HbA1c values consistently >8%.    HB A1C (BAYER DCA - WAIVED)  Date Value Ref Range Status  06/13/2022 7.3 (H) 4.8 - 5.6 % Final    Comment:             Prediabetes: 5.7 - 6.4          Diabetes: >6.4          Glycemic control for adults with diabetes: <7.0          Passed - Valid encounter within last 6 months    Recent Outpatient Visits           3 weeks ago Type 2 diabetes mellitus with stage 3b chronic kidney disease, with long-term current use of insulin (HCC)   [place] [facility] [place], [place] T, NP   2 months ago Type 2 diabetes mellitus with stage 3b chronic kidney disease, with long-term current use of insulin (HCC)   [facility] [place], [place] T, NP   3 months ago Type 2 diabetes mellitus with stage 3b chronic kidney disease, with long-term current use of insulin (HCC)   [facility] [place], [place] T, NP   6 months ago Type 2 diabetes mellitus with stage 3b chronic kidney disease, with long-term current use of insulin (HCC)   [place] [facility] [place], [place] T, NP   10 months ago Type 2 diabetes mellitus with stage 3b chronic kidney disease, with long-term current use of insulin (HCC)   [facility] [place], Dorie Rank, NP       Future  Appointments             Tomorrow Larae Grooms, NP [place] [facility]   In 2 months [place], Dorie Rank, NP [place] [facility]

## 2022-07-10 ENCOUNTER — Encounter: Payer: Self-pay | Admitting: Nurse Practitioner

## 2022-07-10 ENCOUNTER — Ambulatory Visit (INDEPENDENT_AMBULATORY_CARE_PROVIDER_SITE_OTHER): Payer: Medicare HMO | Admitting: Nurse Practitioner

## 2022-07-10 VITALS — BP 138/84 | HR 89 | Temp 97.9°F | Wt 155.7 lb

## 2022-07-10 DIAGNOSIS — S0191XD Laceration without foreign body of unspecified part of head, subsequent encounter: Secondary | ICD-10-CM | POA: Diagnosis not present

## 2022-07-10 NOTE — Progress Notes (Signed)
BP 138/84   Pulse 89   Temp 97.9 F (36.6 C) (Oral)   Wt 155 lb 11.2 oz (70.6 kg)   LMP  (LMP Unknown)   SpO2 97%   BMI 31.99 kg/m    Subjective:    Patient ID: Kara Leach, female    DOB: [birth year], 72 y.o.   MRN: [id number]  HPI: Kara Leach is a 72 y.o. female  Chief Complaint  Patient presents with   ER Follow Up    Pt states she was seen at the ER 06/29/22 after a fall. States she has 3 staples in the back of her head due to hitting her head on the night stand.    ED Follow up Patient was seen in the ER on 06/29/22 after a fall.  She hit her head and had to get 3 staples in the ER.  She is no longer experiencing dizziness, headaches.  She is not having nausea or vomiting.  Her head is a little bit sore but it is improving.  Ready to have the staples removed today.  CT reviewed and no acute findings.     Relevant past medical, surgical, family and social history reviewed and updated as indicated. Interim medical history since our last visit reviewed. Allergies and medications reviewed and updated.  Review of Systems  Gastrointestinal:  Negative for nausea.  Skin:        Head injury  Neurological:  Negative for dizziness and headaches.    Per HPI unless specifically indicated above     Objective:    BP 138/84   Pulse 89   Temp 97.9 F (36.6 C) (Oral)   Wt 155 lb 11.2 oz (70.6 kg)   LMP  (LMP Unknown)   SpO2 97%   BMI 31.99 kg/m   Wt Readings from Last 3 Encounters:  07/10/22 155 lb 11.2 oz (70.6 kg)  06/29/22 153 lb (69.4 kg)  06/13/22 153 lb 9.6 oz (69.7 kg)    Physical Exam Vitals and nursing note reviewed.  Constitutional:      General: She is not in acute distress.    Appearance: Normal appearance. She is normal weight. She is not ill-appearing, toxic-appearing or diaphoretic.  HENT:     Head: Normocephalic.      Right Ear: External ear normal.     Left Ear: External ear normal.     Nose: Nose normal.     Mouth/Throat:     Mouth:  Mucous membranes are moist.     Pharynx: Oropharynx is clear.  Eyes:     General:        Right eye: No discharge.        Left eye: No discharge.     Extraocular Movements: Extraocular movements intact.     Conjunctiva/sclera: Conjunctivae normal.     Pupils: Pupils are equal, round, and reactive to light.  Cardiovascular:     Rate and Rhythm: Normal rate and regular rhythm.     Heart sounds: No murmur heard. Pulmonary:     Effort: Pulmonary effort is normal. No respiratory distress.     Breath sounds: Normal breath sounds. No wheezing or rales.  Musculoskeletal:     Cervical back: Normal range of motion and neck supple.  Skin:    General: Skin is warm and dry.     Capillary Refill: Capillary refill takes less than 2 seconds.  Neurological:     General: No focal deficit present.     Mental Status:  She is alert and oriented to person, place, and time. Mental status is at baseline.  Psychiatric:        Mood and Affect: Mood normal.        Behavior: Behavior normal.        Thought Content: Thought content normal.        Judgment: Judgment normal.     Results for orders placed or performed during the hospital encounter of 06/29/22  CBG monitoring, ED  Result Value Ref Range   Glucose-Capillary 99 70 - 99 mg/dL      Assessment & Plan:   Problem List Items Addressed This Visit   None Visit Diagnoses     Laceration of head without foreign body, unspecified part of head, subsequent encounter    -  Primary   Follow up from ER.  3 staples removed from right side of head.  Dizziness and nausea are improving. Discussed how to care for wound and symptoms to monitor for.  Reviewed imaging from ER visit.  No acute findings seen on CT.        Follow up plan: Return if symptoms worsen or fail to improve.

## 2022-07-12 ENCOUNTER — Other Ambulatory Visit: Payer: Self-pay | Admitting: Nurse Practitioner

## 2022-07-12 NOTE — Telephone Encounter (Signed)
Requested Prescriptions  Pending Prescriptions Disp Refills   ONETOUCH ULTRA test strip [Pharmacy Med Name: ONE TOUCH ULTRA BLUE TEST STRP] 100 strip 8    Sig: USE TO CHECK BLOOD SUGAR TWICE DAILY     Endocrinology: Diabetes - Testing Supplies Passed - 07/12/2022  1:50 AM      Passed - Valid encounter within last 12 months    Recent Outpatient Visits           2 days ago Laceration of head without foreign body, unspecified part of head, subsequent encounter   [place] [facility] Larae Grooms, NP   4 weeks ago Type 2 diabetes mellitus with stage 3b chronic kidney disease, with long-term current use of insulin (HCC)   [place] [facility] [place], [place] T, NP   2 months ago Type 2 diabetes mellitus with stage 3b chronic kidney disease, with long-term current use of insulin (HCC)   [place] [facility] [place], [place] T, NP   4 months ago Type 2 diabetes mellitus with stage 3b chronic kidney disease, with long-term current use of insulin (HCC)   [place] [facility] [place], [place] T, NP   7 months ago Type 2 diabetes mellitus with stage 3b chronic kidney disease, with long-term current use of insulin (HCC)    [facility] [place], Dorie Rank, NP       Future Appointments             In 2 months Cannady, Dorie Rank, NP  [facility], [facility]

## 2022-07-30 ENCOUNTER — Ambulatory Visit
Admission: RE | Admit: 2022-07-30 | Discharge: 2022-07-30 | Disposition: A | Discharge status: Home / Self Care | Payer: Medicare HMO | Source: Ambulatory Visit | Attending: Nurse Practitioner | Admitting: Nurse Practitioner

## 2022-07-30 DIAGNOSIS — Z1231 Encounter for screening mammogram for malignant neoplasm of breast: Secondary | ICD-10-CM | POA: Diagnosis not present

## 2022-08-01 DIAGNOSIS — N1831 Chronic kidney disease, stage 3a: Secondary | ICD-10-CM | POA: Diagnosis not present

## 2022-08-01 DIAGNOSIS — I10 Essential (primary) hypertension: Secondary | ICD-10-CM | POA: Diagnosis not present

## 2022-08-01 DIAGNOSIS — E1122 Type 2 diabetes mellitus with diabetic chronic kidney disease: Secondary | ICD-10-CM | POA: Diagnosis not present

## 2022-09-05 ENCOUNTER — Telehealth (INDEPENDENT_AMBULATORY_CARE_PROVIDER_SITE_OTHER): Payer: Self-pay | Admitting: Nurse Practitioner

## 2022-09-05 NOTE — Telephone Encounter (Signed)
Pt will need ONE 1/2 mg Xanax sent to [facility] for laser on Monday June 24 with Dr. Gilda Crease. Pt states she already has one from when it was cancelled. Thank you.

## 2022-09-05 NOTE — Telephone Encounter (Signed)
Prescription left on pharmacy voicemail.

## 2022-09-10 ENCOUNTER — Other Ambulatory Visit (INDEPENDENT_AMBULATORY_CARE_PROVIDER_SITE_OTHER): Payer: Self-pay | Admitting: Vascular Surgery

## 2022-09-10 ENCOUNTER — Ambulatory Visit (INDEPENDENT_AMBULATORY_CARE_PROVIDER_SITE_OTHER): Payer: Medicare HMO | Admitting: Vascular Surgery

## 2022-09-10 DIAGNOSIS — I8310 Varicose veins of unspecified lower extremity with inflammation: Secondary | ICD-10-CM | POA: Insufficient documentation

## 2022-09-10 DIAGNOSIS — I8311 Varicose veins of right lower extremity with inflammation: Secondary | ICD-10-CM | POA: Diagnosis not present

## 2022-09-10 DIAGNOSIS — I83819 Varicose veins of unspecified lower extremities with pain: Secondary | ICD-10-CM

## 2022-09-10 NOTE — Progress Notes (Signed)
    MRN : [id number]  Kara Leach is a 72 y.o. (Mar 07, 1951) female who presents with chief complaint of No chief complaint on file. .    The patient's right lower extremity was sterilely prepped and draped.  The ultrasound machine was used to visualize the right great saphenous vein throughout its course.  A segment below the knee was selected for access.  The saphenous vein was accessed without difficulty using ultrasound guidance with a micropuncture needle.   An 0.018  wire was placed beyond the saphenofemoral junction through the sheath and the microneedle was removed.  The 65 cm sheath was then placed over the wire and the wire and dilator were removed.  The laser fiber was placed through the sheath and its tip was placed approximately 2 cm below the saphenofemoral junction.  Tumescent anesthesia was then created with a dilute lidocaine solution.  Laser energy was then delivered with constant withdrawal of the sheath and laser fiber.  Approximately 1785 Joules of energy were delivered over a length of 31 cm.  Sterile dressings were placed.  The patient tolerated the procedure well without complications.

## 2022-09-13 ENCOUNTER — Ambulatory Visit: Payer: Medicare HMO | Admitting: Nurse Practitioner

## 2022-09-15 NOTE — Patient Instructions (Signed)
Diabetes Mellitus Basics  Diabetes mellitus, or diabetes, is a long-term (chronic) disease. It occurs when the body does not properly use sugar (glucose) that is released from food after you eat. Diabetes mellitus may be caused by one or both of these problems: Your pancreas does not make enough of a hormone called insulin. Your body does not react in a normal way to the insulin that it makes. Insulin lets glucose enter cells in your body. This gives you energy. If you have diabetes, glucose cannot get into cells. This causes high blood glucose (hyperglycemia). How to treat and manage diabetes You may need to take insulin or other diabetes medicines daily to keep your glucose in balance. If you are prescribed insulin, you will learn how to give yourself insulin by injection. You may need to adjust the amount of insulin you take based on the foods that you eat. You will need to check your blood glucose levels using a glucose monitor as told by your health care provider. The readings can help determine if you have low or high blood glucose. Generally, you should have these blood glucose levels: Before meals (preprandial): 80-130 mg/dL (4.4-7.2 mmol/L). After meals (postprandial): below 180 mg/dL (10 mmol/L). Hemoglobin A1c (HbA1c) level: less than 7%. Your health care provider will set treatment goals for you. Keep all follow-up visits. This is important. Follow these instructions at home: Diabetes medicines Take your diabetes medicines every day as told by your health care provider. List your diabetes medicines here: Name of medicine: ______________________________ Amount (dose): _______________ Time (a.m./p.m.): _______________ Notes: ___________________________________ Name of medicine: ______________________________ Amount (dose): _______________ Time (a.m./p.m.): _______________ Notes: ___________________________________ Name of medicine: ______________________________ Amount (dose):  _______________ Time (a.m./p.m.): _______________ Notes: ___________________________________ Insulin If you use insulin, list the types of insulin you use here: Insulin type: ______________________________ Amount (dose): _______________ Time (a.m./p.m.): _______________Notes: ___________________________________ Insulin type: ______________________________ Amount (dose): _______________ Time (a.m./p.m.): _______________ Notes: ___________________________________ Insulin type: ______________________________ Amount (dose): _______________ Time (a.m./p.m.): _______________ Notes: ___________________________________ Insulin type: ______________________________ Amount (dose): _______________ Time (a.m./p.m.): _______________ Notes: ___________________________________ Insulin type: ______________________________ Amount (dose): _______________ Time (a.m./p.m.): _______________ Notes: ___________________________________ Managing blood glucose  Check your blood glucose levels using a glucose monitor as told by your health care provider. Write down the times that you check your glucose levels here: Time: _______________ Notes: ___________________________________ Time: _______________ Notes: ___________________________________ Time: _______________ Notes: ___________________________________ Time: _______________ Notes: ___________________________________ Time: _______________ Notes: ___________________________________ Time: _______________ Notes: ___________________________________  Low blood glucose Low blood glucose (hypoglycemia) is when glucose is at or below 70 mg/dL (3.9 mmol/L). Symptoms may include: Feeling: Hungry. Sweaty and clammy. Irritable or easily upset. Dizzy. Sleepy. Having: A fast heartbeat. A headache. A change in your vision. Numbness around the mouth, lips, or tongue. Having trouble with: Moving (coordination). Sleeping. Treating low blood glucose To treat low blood  glucose, eat or drink something containing sugar right away. If you can think clearly and swallow safely, follow the 15:15 rule: Take 15 grams of a fast-acting carb (carbohydrate), as told by your health care provider. Some fast-acting carbs are: Glucose tablets: take 3-4 tablets. Hard candy: eat 3-5 pieces. Fruit juice: drink 4 oz (120 mL). Regular (not diet) soda: drink 4-6 oz (120-180 mL). Honey or sugar: eat 1 Tbsp (15 mL). Check your blood glucose levels 15 minutes after you take the carb. If your glucose is still at or below 70 mg/dL (3.9 mmol/L), take 15 grams of a carb again. If your glucose does not go above 70 mg/dL (3.9 mmol/L) after   3 tries, get help right away. After your glucose goes back to normal, eat a meal or a snack within 1 hour. Treating very low blood glucose If your glucose is at or below 54 mg/dL (3 mmol/L), you have very low blood glucose (severe hypoglycemia). This is an emergency. Do not wait to see if the symptoms will go away. Get medical help right away. Call your local emergency services (911 in the [place] Do not drive yourself to the hospital. Questions to ask your health care provider Should I talk with a diabetes educator? What equipment will I need to care for myself at home? What diabetes medicines do I need? When should I take them? How often do I need to check my blood glucose levels? What number can I call if I have questions? When is my follow-up visit? Where can I find a support group for people with diabetes? Where to find more information American Diabetes Association: www.diabetes.org Association of Diabetes Care and Education Specialists: www.diabeteseducator.org Contact a health care provider if: Your blood glucose is at or above 240 mg/dL (13.3 mmol/L) for 2 days in a row. You have been sick or have had a fever for 2 days or more, and you are not getting better. You have any of these problems for more than 6 hours: You cannot eat or  drink. You feel nauseous. You vomit. You have diarrhea. Get help right away if: Your blood glucose is lower than 54 mg/dL (3 mmol/L). You get confused. You have trouble thinking clearly. You have trouble breathing. These symptoms may represent a serious problem that is an emergency. Do not wait to see if the symptoms will go away. Get medical help right away. Call your local emergency services (911 in the [place] Do not drive yourself to the hospital. Summary Diabetes mellitus is a chronic disease that occurs when the body does not properly use sugar (glucose) that is released from food after you eat. Take insulin and diabetes medicines as told. Check your blood glucose every day, as often as told. Keep all follow-up visits. This is important. This information is not intended to replace advice given to you by your health care provider. Make sure you discuss any questions you have with your health care provider. Document Revised: 07/07/2019 Document Reviewed: 07/07/2019 Elsevier Patient Education  2024 Elsevier Inc.  

## 2022-09-16 ENCOUNTER — Encounter (INDEPENDENT_AMBULATORY_CARE_PROVIDER_SITE_OTHER): Payer: Self-pay | Admitting: Vascular Surgery

## 2022-09-17 ENCOUNTER — Ambulatory Visit (INDEPENDENT_AMBULATORY_CARE_PROVIDER_SITE_OTHER): Payer: Medicare HMO

## 2022-09-17 DIAGNOSIS — I83811 Varicose veins of right lower extremities with pain: Secondary | ICD-10-CM | POA: Diagnosis not present

## 2022-09-17 DIAGNOSIS — I83819 Varicose veins of unspecified lower extremities with pain: Secondary | ICD-10-CM

## 2022-09-19 ENCOUNTER — Ambulatory Visit (INDEPENDENT_AMBULATORY_CARE_PROVIDER_SITE_OTHER): Payer: Medicare HMO | Admitting: Nurse Practitioner

## 2022-09-19 ENCOUNTER — Encounter: Payer: Self-pay | Admitting: Nurse Practitioner

## 2022-09-19 ENCOUNTER — Other Ambulatory Visit: Payer: Self-pay | Admitting: Nurse Practitioner

## 2022-09-19 VITALS — BP 137/80 | HR 87 | Temp 97.8°F | Ht <= 58 in | Wt 155.0 lb

## 2022-09-19 DIAGNOSIS — D472 Monoclonal gammopathy: Secondary | ICD-10-CM

## 2022-09-19 DIAGNOSIS — N1832 Chronic kidney disease, stage 3b: Secondary | ICD-10-CM

## 2022-09-19 DIAGNOSIS — E1169 Type 2 diabetes mellitus with other specified complication: Secondary | ICD-10-CM | POA: Diagnosis not present

## 2022-09-19 DIAGNOSIS — E6609 Other obesity due to excess calories: Secondary | ICD-10-CM | POA: Diagnosis not present

## 2022-09-19 DIAGNOSIS — E785 Hyperlipidemia, unspecified: Secondary | ICD-10-CM | POA: Diagnosis not present

## 2022-09-19 DIAGNOSIS — F324 Major depressive disorder, single episode, in partial remission: Secondary | ICD-10-CM

## 2022-09-19 DIAGNOSIS — I8310 Varicose veins of unspecified lower extremity with inflammation: Secondary | ICD-10-CM | POA: Diagnosis not present

## 2022-09-19 DIAGNOSIS — I152 Hypertension secondary to endocrine disorders: Secondary | ICD-10-CM

## 2022-09-19 DIAGNOSIS — E1122 Type 2 diabetes mellitus with diabetic chronic kidney disease: Secondary | ICD-10-CM | POA: Diagnosis not present

## 2022-09-19 DIAGNOSIS — Z794 Long term (current) use of insulin: Secondary | ICD-10-CM

## 2022-09-19 DIAGNOSIS — Z6831 Body mass index (BMI) 31.0-31.9, adult: Secondary | ICD-10-CM

## 2022-09-19 DIAGNOSIS — E1159 Type 2 diabetes mellitus with other circulatory complications: Secondary | ICD-10-CM

## 2022-09-19 LAB — BAYER DCA HB A1C WAIVED: HB A1C (BAYER DCA - WAIVED): 6.7 % — ABNORMAL HIGH (ref 4.8–5.6)

## 2022-09-19 MED ORDER — NYSTATIN 100000 UNIT/GM EX POWD: 1.0000 | Freq: Three times a day (TID) | CUTANEOUS | 4 refills | Status: DC

## 2022-09-19 NOTE — Assessment & Plan Note (Signed)
Chronic, ongoing with A1c 6.7% today, trending down at this time, and urine ALB 27 May 2022.  Recommend she check sugars 2 hours after a meal + fasting in morning -- bring to visits.  Continue current medication regimen and adjust as needed.  Educated her on insulin and to reduce [place] by 3 units if sugars consistently in 70-80 range.  Goal in long run would be to reduce insulin, but at this time she wishes to maintain -- could increase Ozempic to 2 MG next visit if trends above 7%.   - Eye and foot exam up to date.   - Vaccines up to date.   - ACE and Statin on board.   - Return in 3 months.

## 2022-09-19 NOTE — Assessment & Plan Note (Signed)
Chronic, stable.  Continue collaboration with Dr. Finnegan at [facility] - recent notes and labs reviewed. 

## 2022-09-19 NOTE — Telephone Encounter (Signed)
Requested medication (s) are due for refill today: Yes  Requested medication (s) are on the active medication list: Yes  Last refill:  09/19/22  Future visit scheduled: Yes  Notes to clinic:  Pharmacy comment: Alternative Requested:NOT COVERED BY INSURANCE       Requested Prescriptions  Pending Prescriptions Disp Refills   nystatin (MYCOSTATIN/NYSTOP) powder [Pharmacy Med Name: NYSTATIN 100,000 UNIT/GM POWD] 15 g 4    Sig: APPLY TOPICALLY 3 TIMES A DAY     Off-Protocol Failed - 09/19/2022 12:21 PM      Failed - Medication not assigned to a protocol, review manually.      Passed - Valid encounter within last 12 months    Recent Outpatient Visits           Today Type 2 diabetes mellitus with stage 3b chronic kidney disease, with long-term current use of insulin (HCC)   [place] [facility] [place], [place] T, NP   2 months ago Laceration of head without foreign body, unspecified part of head, subsequent encounter   [place] [facility] Larae Grooms, NP   3 months ago Type 2 diabetes mellitus with stage 3b chronic kidney disease, with long-term current use of insulin (HCC)   [place] [facility] [place], [place] T, NP   4 months ago Type 2 diabetes mellitus with stage 3b chronic kidney disease, with long-term current use of insulin (HCC)   [place] [facility] [place], [place] T, NP   6 months ago Type 2 diabetes mellitus with stage 3b chronic kidney disease, with long-term current use of insulin (HCC)   [place] [facility] [place], Dorie Rank, NP       Future Appointments             In 3 months Cannady, Dorie Rank, NP [place] [facility], [facility]

## 2022-09-19 NOTE — Assessment & Plan Note (Signed)
BMI 32.40.  Recommended eating smaller high protein, low fat meals more frequently and exercising 30 mins a day 5 times a week with a goal of 10-15lb weight loss in the next 3 months. Patient voiced their understanding and motivation to adhere to these recommendations.

## 2022-09-19 NOTE — Assessment & Plan Note (Signed)
Chronic, stable.  Denies SI/HI.  Continue current medication regimen, Sertraline 100 MG daily, and adjust as needed. Recommend meditation and relaxation techniques at home. 

## 2022-09-19 NOTE — Assessment & Plan Note (Addendum)
Chronic, ongoing.  Continue current medication regimen and adjust as needed.  Lipid panel up to date. 

## 2022-09-19 NOTE — Assessment & Plan Note (Signed)
Chronic, stable.  Followed by vascular with recent treatment performed.  Continue this collaboration.

## 2022-09-19 NOTE — Assessment & Plan Note (Signed)
Chronic, stable.  BP remains at goal in office and at home for age.  Continue current medication regimen and adjust as needed.  Alert provider if BP consistently >130/80 at home and then may restart Amlodipine at 2.5 MG.  Continue Metoprolol and Enalapril and adjust as needed.  Focus on DASH diet.  Enalapril for kidney protection.  LABS: up to date with nephrology.

## 2022-09-19 NOTE — Progress Notes (Signed)
BP 137/80   Pulse 87   Temp 97.8 F (36.6 C) (Oral)   Ht 4' 9.99" (1.473 m)   Wt 155 lb (70.3 kg)   LMP  (LMP Unknown)   SpO2 98%   BMI 32.40 kg/m    Subjective:    Patient ID: Kara Leach, female    DOB: 17-Apr-1950, 71 y.o.   MRN: [id number]  HPI: Kara Leach is a 72 y.o. female  Chief Complaint  Patient presents with   Diabetes   Hypertension   MGUS   Mood   Chronic Kidney Disease   DIABETES A1c 7.3% in March. Continues on Farxiga 10 MG daily, Novolog, Tresiba, and Ozempic 1 MG weekly.  Tolerating medications well.  Occasional vaginal itching presenting.   Hypoglycemic episodes: none Polydipsia/polyuria: no Visual disturbance: no Chest pain: no Paresthesias: no Glucose Monitoring: yes             Accucheck frequency: BID             Fasting glucose: 93 this morning -- average 133             Post prandial:             Evening: 130-135             Before meals: Taking Insulin?: yes             Long acting insulin: Tresiba 47 units             Short acting insulin: Novolog 12 units morning/afternoon & 14 at dinner Blood Pressure Monitoring: daily Retinal Examination: Up To Date -- February at [facility] Foot Exam: Up to Date Pneumovax: Up to Date Influenza: Up to Date Aspirin: yes    HYPERTENSION / HYPERLIPIDEMIA Continues on Enalapril, Metoprolol, and ASA + Simvastatin.  Following with vascular, she had surgery on varicose veins on 09/10/22 -- ultrasound on 09/17/22 and this was stable.  Amlodipine taken in past, but discontinued due to stable BP levels. Satisfied with current treatment? yes Duration of hypertension: chronic BP monitoring frequency: daily BP range: 130/80 range BP medication side effects: no Duration of hyperlipidemia: chronic Cholesterol medication side effects: no Cholesterol supplements: none Medication compliance: good compliance Aspirin: yes Recent stressors: no Recurrent headaches: no Visual changes: no Palpitations:  no Dyspnea: no Chest pain: no Lower extremity edema: occasional Dizzy/lightheaded: no The ASCVD Risk score (Arnett DK, et al., 2019) failed to calculate for the following reasons:   The patient has a prior MI or stroke diagnosis  CHRONIC KIDNEY DISEASE & MGUS Sees Dr. Cherylann Ratel, last visit 08/01/22. Labs with them showed CRT 1.29 and eGFR 45.    MGUS followed by providers at cancer center and last seen 01/25/22.  To continue yearly visits and labs with them. Slowly progressing and they monitor only. CKD status: stable Medications renally dose: yes Previous renal evaluation: no Pneumovax:  Up to Date Influenza Vaccine:  Up to Date    Latest Ref Rng & Units 06/13/2022    9:53 AM 03/14/2022   10:08 AM 01/18/2022   11:58 AM  BMP  Glucose 70 - 99 mg/dL 93  409  811   BUN 8 - 27 mg/dL 13  18  18    Creatinine 0.57 - 1.00 mg/dL 9.14  7.82  9.56   BUN/Creat Ratio 12 - 28 11  16     Sodium 134 - 144 mmol/L 144  140  138   Potassium 3.5 - 5.2 mmol/L 4.4  4.5  4.3   Chloride 96 - 106 mmol/L 104  102  105   CO2 20 - 29 mmol/L 24  22  24    Calcium 8.7 - 10.3 mg/dL 9.6  16.1  9.8     DEPRESSION Continues on Sertraline 100 MG.   Mood status: stable Satisfied with current treatment?: yes Symptom severity: moderate  Duration of current treatment : chronic Side effects: no Medication compliance: good compliance Depressed mood: occasional Anxious mood: occasional Anhedonia: no Significant weight loss or gain: no Insomnia: occasional Fatigue: no Feelings of worthlessness or guilt: no Impaired concentration/indecisiveness: no Suicidal ideations: no Hopelessness: no Crying spells: no    09/19/2022   11:20 AM 06/13/2022    9:57 AM 04/25/2022   10:59 AM 03/14/2022   10:06 AM 12/13/2021    9:30 AM  Depression screen PHQ 2/9  Decreased Interest 1 1 1 1 1   Down, Depressed, Hopeless 1 1 1 1 2   PHQ - 2 Score 2 2 2 2 3   Altered sleeping 1 2 2 1 2   Tired, decreased energy 1 1 2 1 2   Change in  appetite 1 1 1 1  0  Feeling bad or failure about yourself  1 1 1 1 2   Trouble concentrating 1 0 0 0 0  Moving slowly or fidgety/restless 0 0 0 0 0  Suicidal thoughts 0 0 0 0 0  PHQ-9 Score 7 7 8 6 9   Difficult doing work/chores Somewhat difficult Somewhat difficult Somewhat difficult Somewhat difficult Somewhat difficult       09/19/2022   11:21 AM 06/13/2022    9:58 AM 04/25/2022   11:00 AM 03/14/2022   10:06 AM  GAD 7 : Generalized Anxiety Score  Nervous, Anxious, on Edge 1 1 1 1   Control/stop worrying 1 1 1 1   Worry too much - different things 1 1 1 1   Trouble relaxing 1 1 1 1   Restless 0 0 0 1  Easily annoyed or irritable 0 1 0 0  Afraid - awful might happen 0 1 1 1   Total GAD 7 Score 4 6 5 6   Anxiety Difficulty Somewhat difficult Somewhat difficult Somewhat difficult Somewhat difficult    Relevant past medical, surgical, family and social history reviewed and updated as indicated. Interim medical history since our last visit reviewed. Allergies and medications reviewed and updated.  Review of Systems  Constitutional:  Negative for activity change, appetite change, diaphoresis, fatigue and fever.  Respiratory:  Negative for cough, chest tightness and shortness of breath.   Cardiovascular:  Negative for chest pain, palpitations and leg swelling.  Gastrointestinal: Negative.   Endocrine: Negative for cold intolerance, heat intolerance, polydipsia, polyphagia and polyuria.  Neurological: Negative.   Psychiatric/Behavioral: Negative.      Per HPI unless specifically indicated above     Objective:    BP 137/80   Pulse 87   Temp 97.8 F (36.6 C) (Oral)   Ht 4' 9.99" (1.473 m)   Wt 155 lb (70.3 kg)   LMP  (LMP Unknown)   SpO2 98%   BMI 32.40 kg/m   Wt Readings from Last 3 Encounters:  09/19/22 155 lb (70.3 kg)  07/10/22 155 lb 11.2 oz (70.6 kg)  06/29/22 153 lb (69.4 kg)    Physical Exam Vitals and nursing note reviewed.  Constitutional:      General: She is  awake. She is not in acute distress.    Appearance: She is well-developed and well-groomed. She is obese. She is  not ill-appearing.  HENT:     Head: Normocephalic.     Right Ear: Hearing normal.     Left Ear: Hearing normal.  Eyes:     General: Lids are normal.        Right eye: No discharge.        Left eye: No discharge.     Conjunctiva/sclera: Conjunctivae normal.     Pupils: Pupils are equal, round, and reactive to light.  Neck:     Thyroid: No thyromegaly.     Vascular: No carotid bruit.  Cardiovascular:     Rate and Rhythm: Normal rate and regular rhythm.     Heart sounds: Normal heart sounds. No murmur heard.    No gallop.     Comments:   Pulmonary:     Effort: Pulmonary effort is normal. No accessory muscle usage or respiratory distress.     Breath sounds: Normal breath sounds.  Abdominal:     General: Bowel sounds are normal.     Palpations: Abdomen is soft.     Tenderness: There is no abdominal tenderness.  Musculoskeletal:     Cervical back: Normal range of motion and neck supple.     Right lower leg: No edema.     Left lower leg: No edema.  Lymphadenopathy:     Cervical: No cervical adenopathy.  Skin:    General: Skin is warm and dry.  Neurological:     Mental Status: She is alert and oriented to person, place, and time.  Psychiatric:        Attention and Perception: Attention normal.        Mood and Affect: Mood normal.        Speech: Speech normal.        Behavior: Behavior normal. Behavior is cooperative.        Thought Content: Thought content normal.     Results for orders placed or performed during the hospital encounter of 06/29/22  CBG monitoring, ED  Result Value Ref Range   Glucose-Capillary 99 70 - 99 mg/dL      Assessment & Plan:   Problem List Items Addressed This Visit       Cardiovascular and Mediastinum   Hypertension associated with diabetes (HCC) (Chronic)    Chronic, stable.  BP remains at goal in office and at home for age.   Continue current medication regimen and adjust as needed.  Alert provider if BP consistently >130/80 at home and then may restart Amlodipine at 2.5 MG.  Continue Metoprolol and Enalapril and adjust as needed.  Focus on DASH diet.  Enalapril for kidney protection.  LABS: up to date with nephrology.       Relevant Orders   Bayer DCA Hb A1c Waived   Varicose veins with inflammation    Chronic, stable.  Followed by vascular with recent treatment performed.  Continue this collaboration.        Endocrine   Hyperlipidemia associated with type 2 diabetes mellitus (HCC) (Chronic)    Chronic, ongoing.  Continue current medication regimen and adjust as needed.  Lipid panel up to date.      Relevant Orders   Bayer DCA Hb A1c Waived   Type 2 diabetes mellitus with stage 3b chronic kidney disease, with long-term current use of insulin (HCC) - Primary (Chronic)    Chronic, ongoing with A1c 6.7% today, trending down at this time, and urine ALB 27 May 2022.  Recommend she check sugars 2 hours after a meal +  fasting in morning -- bring to visits.  Continue current medication regimen and adjust as needed.  Educated her on insulin and to reduce [place] by 3 units if sugars consistently in 70-80 range.  Goal in long run would be to reduce insulin, but at this time she wishes to maintain -- could increase Ozempic to 2 MG next visit if trends above 7%.   - Eye and foot exam up to date.   - Vaccines up to date.   - ACE and Statin on board.   - Return in 3 months.      Relevant Orders   Bayer DCA Hb A1c Waived     Other   MGUS (monoclonal gammopathy of unknown significance) (Chronic)    Chronic, stable.  Continue collaboration with Dr. Orlie Dakin at [facility] - recent notes and labs reviewed.      Obesity (Chronic)    BMI 32.40.  Recommended eating smaller high protein, low fat meals more frequently and exercising 30 mins a day 5 times a week with a goal of 10-15lb weight loss in the next 3 months. Patient  voiced their understanding and motivation to adhere to these recommendations.       Depression, major, single episode, in partial remission (HCC)    Chronic, stable.  Denies SI/HI.  Continue current medication regimen, Sertraline 100 MG daily, and adjust as needed. Recommend meditation and relaxation techniques at home.        Follow up plan: Return in about 3 months (around 12/20/2022) for T2DM, HTN/HLD, MGUS, CKD, MOOD + needs Medicare Wellness with nurse after 10/05/22.

## 2022-09-20 ENCOUNTER — Other Ambulatory Visit: Payer: Self-pay | Admitting: Nurse Practitioner

## 2022-09-21 NOTE — Telephone Encounter (Signed)
Requested Prescriptions  Pending Prescriptions Disp Refills   NOVOLOG FLEXPEN 100 UNIT/ML FlexPen [Pharmacy Med Name: NOVOLOG 100 UNIT/ML FLEXPEN] 15 mL 0    Sig: INJECT 12-14 UNITS UNDER THE SKIN 3 TIMES DAILY WITH MEALS     Endocrinology:  Diabetes - Insulins Passed - 09/20/2022  2:37 AM      Passed - HBA1C is between 0 and 7.9 and within 180 days    Hemoglobin A1C  Date Value Ref Range Status  11/12/2013 13.4 (H) 4.2 - 6.3 % Final    Comment:    The American Diabetes Association recommends that a primary goal of therapy should be <7% and that physicians should reevaluate the treatment regimen in patients with HbA1c values consistently >8%.    HB A1C (BAYER DCA - WAIVED)  Date Value Ref Range Status  09/19/2022 6.7 (H) 4.8 - 5.6 % Final    Comment:             Prediabetes: 5.7 - 6.4          Diabetes: >6.4          Glycemic control for adults with diabetes: <7.0          Passed - Valid encounter within last 6 months    Recent Outpatient Visits           2 days ago Type 2 diabetes mellitus with stage 3b chronic kidney disease, with long-term current use of insulin (HCC)   [facility] [place], [place] T, NP   2 months ago Laceration of head without foreign body, unspecified part of head, subsequent encounter   [place] [facility] Larae Grooms, NP   3 months ago Type 2 diabetes mellitus with stage 3b chronic kidney disease, with long-term current use of insulin (HCC)   [facility] [place], [place] T, NP   4 months ago Type 2 diabetes mellitus with stage 3b chronic kidney disease, with long-term current use of insulin (HCC)   [facility] [place], [place] T, NP   6 months ago Type 2 diabetes mellitus with stage 3b chronic kidney disease, with long-term current use of insulin (HCC)   [place] [facility] [place], Dorie Rank, NP       Future Appointments             In 3  months Cannady, Dorie Rank, NP [place] [facility]

## 2022-09-24 ENCOUNTER — Other Ambulatory Visit: Payer: Self-pay | Admitting: Nurse Practitioner

## 2022-09-24 DIAGNOSIS — J309 Allergic rhinitis, unspecified: Secondary | ICD-10-CM

## 2022-09-25 NOTE — Telephone Encounter (Signed)
Requested Prescriptions  Pending Prescriptions Disp Refills   fluticasone (FLONASE) 50 MCG/ACT nasal spray [Pharmacy Med Name: FLUTICASONE PROP 50 MCG SPRAY] 48 mL 3    Sig: PLACE 2 SPRAYS INTO BOTH NOSTRILS DAILY AS NEEDED FOR ALLERGIES.     Ear, Nose, and Throat: Nasal Preparations - Corticosteroids Passed - 09/24/2022  8:53 PM      Passed - Valid encounter within last 12 months    Recent Outpatient Visits           6 days ago Type 2 diabetes mellitus with stage 3b chronic kidney disease, with long-term current use of insulin (HCC)   [place] [facility] [place], [place] T, NP   2 months ago Laceration of head without foreign body, unspecified part of head, subsequent encounter   [place] [facility] Larae Grooms, NP   3 months ago Type 2 diabetes mellitus with stage 3b chronic kidney disease, with long-term current use of insulin (HCC)   [facility] [place], [place] T, NP   5 months ago Type 2 diabetes mellitus with stage 3b chronic kidney disease, with long-term current use of insulin (HCC)   [facility] [place], [place] T, NP   6 months ago Type 2 diabetes mellitus with stage 3b chronic kidney disease, with long-term current use of insulin (HCC)   [place] [facility] [place], Dorie Rank, NP       Future Appointments             In 2 months Cannady, Dorie Rank, NP [facility], [facility]

## 2022-10-08 ENCOUNTER — Ambulatory Visit (INDEPENDENT_AMBULATORY_CARE_PROVIDER_SITE_OTHER): Payer: Medicare HMO | Admitting: Nurse Practitioner

## 2022-10-09 ENCOUNTER — Ambulatory Visit (INDEPENDENT_AMBULATORY_CARE_PROVIDER_SITE_OTHER): Payer: Medicare HMO

## 2022-10-09 VITALS — Ht 60.0 in | Wt 155.0 lb

## 2022-10-09 DIAGNOSIS — Z0000 Encounter for general adult medical examination without abnormal findings: Secondary | ICD-10-CM

## 2022-10-09 NOTE — Progress Notes (Signed)
Subjective:   Kara Leach is a 72 y.o. female who presents for Medicare Annual (Subsequent) preventive examination.  Visit Complete: Virtual  I connected with  Kara Leach on 10/09/22 by a audio enabled telemedicine application and verified that I am speaking with the correct person using two identifiers.  Patient Location: Home  Provider Location: Office/Clinic  I discussed the limitations of evaluation and management by telemedicine. The patient expressed understanding and agreed to proceed.  Review of Systems     Cardiac Risk Factors include: advanced age (>56men, >55 women);diabetes mellitus;dyslipidemia;hypertension     Objective:    Today's Vitals   10/09/22 1007 10/09/22 1024  Weight:  155 lb (70.3 kg)  Height:  5' (1.524 m)  PainSc: 5     Body mass index is 30.27 kg/m.     10/09/2022   10:13 AM 06/29/2022   10:05 AM 01/25/2022   11:06 AM 10/04/2021   11:09 AM 01/26/2021   10:54 AM 02/15/2020    1:09 PM 01/26/2020   11:02 AM  Advanced Directives  Does Patient Have a Medical Advance Directive? Yes No Yes Yes Yes Yes Yes  Type of Estate agent of [place];Living will   Healthcare Power of [employer] of [place];Living will Healthcare Power of [place];Living will Healthcare Power of [place];Living will  Does patient want to make changes to medical advance directive? No - Patient declined    No - Patient declined    Copy of Healthcare Power of Attorney in Chart? Yes - validated most recent copy scanned in chart (See row information)   Yes - validated most recent copy scanned in chart (See row information)  Yes - validated most recent copy scanned in chart (See row information)   Would patient like information on creating a medical advance directive?  No - Patient declined         Current Medications (verified) Outpatient Encounter Medications as of 10/09/2022  Medication Sig   acetaminophen (TYLENOL) 650 MG CR tablet  Take 650 mg by mouth every 8 (eight) hours as needed for pain.   allopurinol (ZYLOPRIM) 100 MG tablet Take 1 tablet (100 mg total) by mouth daily.   aspirin EC 81 MG tablet Take 81 mg by mouth daily.   BD PEN NEEDLE NANO U/F 32G X 4 MM MISC USE EVERY MORNING   Blood Glucose Monitoring Suppl (ONETOUCH VERIO REFLECT) w/Device KIT USE TO CHECK BLOOD SUGAR DAILY   Calcium Carb-Cholecalciferol (CALCIUM 600 + D PO) Take 1 tablet by mouth 2 (two) times daily. 1000 IU VITAMIN D   dapagliflozin propanediol (FARXIGA) 10 MG TABS tablet Take 10 mg by mouth daily.   enalapril (VASOTEC) 2.5 MG tablet Take 1 tablet (2.5 mg total) by mouth daily.   fluticasone (FLONASE) 50 MCG/ACT nasal spray PLACE 2 SPRAYS INTO BOTH NOSTRILS DAILY AS NEEDED FOR ALLERGIES.   glucose blood (ONETOUCH ULTRA) test strip USE TO CHECK BLOOD SUGAR TWICE DAILY   insulin aspart (NOVOLOG FLEXPEN) 100 UNIT/ML FlexPen INJECT 12-14 UNITS UNDER THE SKIN 3 TIMES DAILY WITH MEALS   Insulin Degludec (TRESIBA FLEXTOUCH [place]) Inject 47 Units into the skin at bedtime.   Insulin Pen Needle (B-D ULTRAFINE III SHORT PEN) 31G X 8 MM MISC 1 each by Does not apply route 3 (three) times daily.   Lancets (ONETOUCH DELICA PLUS LANCET30G) MISC Use to check blood sugars 3 to 4 times a day.   Loratadine (CLARITIN PO) Take 10 mg by mouth as needed.  MELATONIN PO Take by mouth as needed.   metoprolol succinate (TOPROL-XL) 25 MG 24 hr tablet Take 1 tablet (25 mg total) by mouth daily.   nystatin (MYCOSTATIN/NYSTOP) powder Apply 1 Application topically 3 (three) times daily.   Semaglutide, 1 MG/DOSE, 4 MG/3ML SOPN Inject 1 mg as directed once a week.   sertraline (ZOLOFT) 100 MG tablet Take 1 tablet (100 mg total) by mouth every morning.   simvastatin (ZOCOR) 40 MG tablet TAKE 1 TABLET BY MOUTH EVERYDAY AT BEDTIME   Specialty Vitamins Products (VITA-RX DIABETIC VITAMIN) CAPS Take 1 packet by mouth daily. Diabetic Vitamins - 6 or 7 in 1 pack   No  facility-administered encounter medications on file as of 10/09/2022.    Allergies (verified) Strawberry extract, Ibuprofen, and Cat hair extract   History: Past Medical History:  Diagnosis Date   Allergy    Anxiety    Arthritis    Osteoarthritis   Cancer (HCC) 05/2018   possibly on left breast but not sure   Chronic kidney disease    Depression    Diabetes mellitus without complication (HCC) 2012   Diabetic neuropathy (HCC)    feet   Diverticulitis 2017   GERD (gastroesophageal reflux disease)    Headache    sinus and migraines on occasion   Hyperlipidemia    Hypertension    Last menstrual period (LMP) > 10 days ago 1986   Obesity    Peripheral vascular disease (HCC)    neuropathy in hands and feet d/t diabetes   Renal insufficiency    Stage 2 kidney disease   Seizures (HCC)    due to high blood sugars. last time = 09/2014   Sleep apnea    has CPAP, can't tolerate   Stroke (HCC) 1980   minor. left side remains weaker than right   Wears hearing aid    right side   Wrist weakness    left - after fracture and repair   Past Surgical History:  Procedure Laterality Date   ABDOMINAL HYSTERECTOMY  1986   APPENDECTOMY  1964   BREAST BIOPSY Left 07/03/2016   benign, fibrocystic changes with calcifications, usual ductal hyperplasia.    BREAST BIOPSY Left 04/23/2018   radial scar   BREAST BIOPSY Left 08/13/2018   Procedure: WIDE LOCALIZATION AND OPEN BIOPSY OF LEFT BREAST, DIABETIC;  Surgeon: Earline Mayotte, MD;  Location: [facility];  Service: General;  Laterality: Left;   BREAST EXCISIONAL BIOPSY Left 08/13/2018   CHOLECYSTECTOMY  2012   COLONOSCOPY WITH PROPOFOL N/A 12/01/2015   Procedure: COLONOSCOPY WITH PROPOFOL;  Surgeon: Midge Minium, MD;  Location: [facility];  Service: Endoscopy;  Laterality: N/A;  Diabetic - insulin Sleep apnea   PATELLA FRACTURE SURGERY Left    1. had to recenter kneecap, 2 and 3 surgeries were arthroscopies   WRIST FRACTURE SURGERY  Left 2005   Steel plate   Family History  Problem Relation Age of Onset   Diabetes Mother    Heart disease Mother    Hypertension Mother    Cancer Father        stomach   Diabetes Sister    Arthritis Sister    Stroke Sister    Hypertension Sister    Breast cancer Maternal Aunt 29       great aunt   Parkinson's disease Maternal Grandmother    Tremor Maternal Grandmother    Social History   Socioeconomic History   Marital status: Widowed    Spouse name:  Not on file   Number of children: Not on file   Years of education: Not on file   Highest education level: Not on file  Occupational History   Occupation: worked at [employer] for 41 years    Comment: retired  Tobacco Use   Smoking status: Never   Smokeless tobacco: Never  Vaping Use   Vaping status: Never Used  Substance and Sexual Activity   Alcohol use: Yes    Comment: occasionally holiday, weddings   Drug use: No   Sexual activity: Never  Other Topics Concern   Not on file  Social History Narrative   Not on file   Social Determinants of Health   Financial Resource Strain: Low Risk  (10/09/2022)   Overall Financial Resource Strain (CARDIA)    Difficulty of Paying Living Expenses: Not hard at all  Food Insecurity: No Food Insecurity (10/09/2022)   Hunger Vital Sign    Worried About Running Out of Food in the Last Year: Never true    Ran Out of Food in the Last Year: Never true  Transportation Needs: No Transportation Needs (10/09/2022)   PRAPARE - Administrator, Civil Service (Medical): No    Lack of Transportation (Non-Medical): No  Physical Activity: Insufficiently Active (10/09/2022)   Exercise Vital Sign    Days of Exercise per Week: 3 days    Minutes of Exercise per Session: 30 min  Stress: No Stress Concern Present (10/09/2022)   [employer] of Occupational Health - Occupational Stress Questionnaire    Feeling of Stress : Not at all  Social Connections: Moderately Isolated (10/09/2022)    Social Connection and Isolation Panel [NHANES]    Frequency of Communication with Friends and Family: More than three times a week    Frequency of Social Gatherings with Friends and Family: More than three times a week    Attends Religious Services: More than 4 times per year    Active Member of [employer] or Organizations: No    Attends Banker Meetings: Never    Marital Status: Widowed    Tobacco Counseling Counseling given: Not Answered   Clinical Intake:  Pre-visit preparation completed: Yes  Pain : 0-10 Pain Score: 5  Pain Type: Chronic pain Pain Location: Knee Pain Orientation: Left     Nutritional Risks: None Diabetes: Yes CBG done?: No Did pt. bring in CBG monitor from home?: No  How often do you need to have someone help you when you read instructions, pamphlets, or other written materials from your doctor or pharmacy?: 1 - Never  Interpreter Needed?: No  Information entered by :: Kennedy Bucker, LPN   Activities of Daily Living    10/09/2022   10:14 AM 03/14/2022   10:31 AM  In your present state of health, do you have any difficulty performing the following activities:  Hearing? 0 0  Vision? 0 0  Difficulty concentrating or making decisions? 0 0  Walking or climbing stairs? 1 0  Comment knees   Dressing or bathing? 0 0  Doing errands, shopping? 0 0  Preparing Food and eating ? N   Using the Toilet? N   In the past six months, have you accidently leaked urine? N   Do you have problems with loss of bowel control? N   Managing your Medications? N   Managing your Finances? N   Housekeeping or managing your Housekeeping? N     Patient Care Team: Marjie Skiff, NP as PCP -  General (Nurse Practitioner) Earline Mayotte, MD (General Surgery) Mady Haagensen, MD (Internal Medicine) Erin Sons, MD (Inactive) (Orthopedic Surgery) Jeralyn Ruths, MD as Consulting Physician (Oncology) Lajean Manes, [facility] (Inactive)  (Pharmacist)  Indicate any recent Medical Services you may have received from other than [facility] providers in the past year (date may be approximate).     Assessment:   This is a routine wellness examination for Cody.  Hearing/Vision screen Hearing Screening - Comments:: Wears aids Vision Screening - Comments:: Wears glasses- Dr.Porfilio  Dietary issues and exercise activities discussed:     Goals Addressed             This Visit's Progress    DIET - EAT MORE FRUITS AND VEGETABLES         Depression Screen    10/09/2022   10:12 AM 09/19/2022   11:20 AM 06/13/2022    9:57 AM 04/25/2022   10:59 AM 03/14/2022   10:06 AM 12/13/2021    9:30 AM 10/04/2021   11:15 AM  PHQ 2/9 Scores  PHQ - 2 Score 0 2 2 2 2 3 1   PHQ- 9 Score 0 7 7 8 6 9 3     Fall Risk    10/09/2022   10:14 AM 09/19/2022   11:20 AM 06/13/2022    9:57 AM 04/25/2022   10:59 AM 03/14/2022   10:31 AM  Fall Risk   Falls in the past year? 1 1 0 1 0  Number falls in past yr: 1 1 0 1 0  Injury with Fall? 1 1 0 1 0  Risk for fall due to : History of fall(s) History of fall(s) History of fall(s) History of fall(s) No Fall Risks  Follow up Falls prevention discussed;Falls evaluation completed Falls evaluation completed Falls evaluation completed Falls evaluation completed Falls prevention discussed    MEDICARE RISK AT HOME:  Medicare Risk at Home - 10/09/22 1015     Any stairs in or around the home? No    If so, are there any without handrails? No    Home free of loose throw rugs in walkways, pet beds, electrical cords, etc? Yes    Adequate lighting in your home to reduce risk of falls? Yes    Life alert? No    Use of a cane, walker or w/c? Yes   cane sometimes   Grab bars in the bathroom? No    Shower chair or bench in shower? No    Elevated toilet seat or a handicapped toilet? No             TIMED UP AND GO:  Was the test performed?  No    Cognitive Function:        10/09/2022   10:19 AM 10/04/2021    11:08 AM 02/15/2020    1:21 PM 12/05/2017   10:29 AM  6CIT Screen  What Year? 0 points 0 points 0 points 0 points  What month? 0 points 0 points 0 points 0 points  What time? 0 points 0 points 0 points 0 points  Count back from 20 0 points 0 points 0 points 0 points  Months in reverse 0 points 0 points 0 points 0 points  Repeat phrase 0 points 0 points 0 points 0 points  Total Score 0 points 0 points 0 points 0 points    Immunizations Immunization History  Administered Date(s) Administered   Covid-19, Mrna,Vaccine(Spikevax)67yrs and older 12/29/2021   Fluad Quad(high Dose 65+) 02/11/2019, 11/20/2019, 12/29/2021  Influenza, High Dose Seasonal PF 12/16/2020   Influenza,inj,Quad PF,6+ Mos 11/01/2016, 11/13/2017   Influenza-Unspecified 12/20/2014, 12/28/2015   Moderna Covid-19 Vaccine Bivalent Booster 29yrs & up 12/16/2020   PFIZER(Purple Top)SARS-COV-2 Vaccination 05/13/2019, 06/03/2019, 01/20/2020   Pneumococcal Conjugate-13 02/12/2018   Pneumococcal Polysaccharide-23 02/14/2009, 08/12/2019   Td 06/24/2008, 05/29/2021   Tdap 01/08/2011, 06/29/2022   Zoster Recombinant(Shingrix) 10/13/2018, 12/16/2020   Zoster, Live 12/28/2015    TDAP status: Up to date  Flu Vaccine status: Up to date  Pneumococcal vaccine status: Up to date  Covid-19 vaccine status: Completed vaccines  Qualifies for Shingles Vaccine? Yes   Zostavax completed Yes   Shingrix Completed?: Yes  Screening Tests Health Maintenance  Topic Date Due   COVID-19 Vaccine (6 - 2023-24 season) 02/23/2022   OPHTHALMOLOGY EXAM  04/14/2022   INFLUENZA VACCINE  10/18/2022   FOOT EXAM  03/15/2023   HEMOGLOBIN A1C  03/22/2023   Diabetic kidney evaluation - eGFR measurement  06/13/2023   Diabetic kidney evaluation - Urine ACR  06/13/2023   DEXA SCAN  10/09/2023   Medicare Annual Wellness (AWV)  10/09/2023   MAMMOGRAM  07/29/2024   Colonoscopy  11/30/2025   DTaP/Tdap/Td (5 - Td or Tdap) 06/28/2032   Pneumonia  Vaccine 69+ Years old  Completed   Hepatitis C Screening  Completed   Zoster Vaccines- Shingrix  Completed   HPV VACCINES  Aged Out    Health Maintenance  Health Maintenance Due  Topic Date Due   COVID-19 Vaccine (6 - 2023-24 season) 02/23/2022   OPHTHALMOLOGY EXAM  04/14/2022    Colorectal cancer screening: Type of screening: Colonoscopy. Completed 12/01/15. Repeat every 10 years  Mammogram status: Completed 07/30/22. Repeat every year  Bone Density status: Completed 10/09/18. Results reflect: Bone density results: OSTEOPENIA. Repeat every 5 years.  Lung Cancer Screening: (Low Dose CT Chest recommended if Age 16-80 years, 20 pack-year currently smoking OR have quit w/in 15years.) does not qualify.    Additional Screening:  Hepatitis C Screening: does qualify; Completed 08/12/15  Vision Screening: Recommended annual ophthalmology exams for early detection of glaucoma and other disorders of the eye. Is the patient up to date with their annual eye exam?  Yes  Who is the provider or what is the name of the office in which the patient attends annual eye exams? Dr.Porfilio If pt is not established with a provider, would they like to be referred to a provider to establish care? No .   Dental Screening: Recommended annual dental exams for proper oral hygiene   Community Resource Referral / Chronic Care Management: CRR required this visit?  No   CCM required this visit?  No     Plan:     I have personally reviewed and noted the following in the patient's chart:   Medical and social history Use of alcohol, tobacco or illicit drugs  Current medications and supplements including opioid prescriptions. Patient is not currently taking opioid prescriptions. Functional ability and status Nutritional status Physical activity Advanced directives List of other physicians Hospitalizations, surgeries, and ER visits in previous 12 months Vitals Screenings to include cognitive,  depression, and falls Referrals and appointments  In addition, I have reviewed and discussed with patient certain preventive protocols, quality metrics, and best practice recommendations. A written personalized care plan for preventive services as well as general preventive health recommendations were provided to patient.     Hal Hope, LPN   1/61/0960   After Visit Summary: (MyChart) Due to this being a telephonic  visit, the after visit summary with patients personalized plan was offered to patient via MyChart   Nurse Notes: none

## 2022-10-09 NOTE — Patient Instructions (Signed)
Ms. Henken , Thank you for taking time to come for your Medicare Wellness Visit. I appreciate your ongoing commitment to your health goals. Please review the following plan we discussed and let me know if I can assist you in the future.   These are the goals we discussed:  Goals      DIET - EAT MORE FRUITS AND VEGETABLES     DIET - INCREASE WATER INTAKE     Recommend continue drinking at least 6-8 glasses of water a day      Patient Stated     02/15/2020, wants to weigh 125 pounds     Patient Stated     Would like to loose a little weight     Pharmacy Chronic Care Management     CARE PLAN ENTRY (see longitudinal plan of care for additional care plan information)  Current Barriers:  Chronic Disease Management support, education, and care coordination needs related to Hypertension, Hyperlipidemia, and Diabetes   Hypertension BP Readings from Last 3 Encounters:  09/08/19 110/70  08/12/19 135/82  05/15/19 104/71   Pharmacist Clinical Goal(s): Over the next 90 days, patient will work with PharmD and providers to maintain BP goal <130/80 Current regimen:  Amlodipine 5 mg qd Enalapril 2.5 mg qd Metoprolol succinate 25 mg qd Interventions: In depth medication review Provided diet and exercise counseling Patient self care activities - Over the next 90 days, patient will: Check BP 2-3 times wekly, document, and provide at future appointments Ensure daily salt intake < 2300 mg/day  Hyperlipidemia Lab Results  Component Value Date/Time   LDLCALC 69 05/15/2019 09:00 AM   LDLCALC SEE COMMENT 11/12/2013 07:45 AM   Pharmacist Clinical Goal(s): Over the next 90 days, patient will work with PharmD and providers to maintain LDL goal < 70 Current regimen:  Simvastatin 40 mg every evening Interventions: Comprehensive medication review Updated medication list in the EMR Provided dietary and lifestyle counseling  Patient self care activities - Over the next 90 days, patient  will: Maintain regular exercise regimen with goal of 30 minutes/day 5 days weekly of moderate intensity exercise. Incorporate weight-bearing and muscle strengthening 3 times weekly. Great job with your hand and ankle weights! Keep it up! Continue heart healthy/diabetic diet with reduced saturated fat, fried foods and sugary snacks.   Diabetes Lab Results  Component Value Date/Time   HGBA1C 6.6 08/12/2019 08:35 AM   HGBA1C 6.9 05/15/2019 08:58 AM   HGBA1C 13.4 (H) 11/12/2013 12:21 PM   Pharmacist Clinical Goal(s): Over the next 90 days, patient will work with PharmD and providers to maintain A1c goal <7%, maximize medication regimen and prevent hypoglycemia. Current regimen:  Levemir 50 units Every evening at 10 pm liraglutide 1.2mg  daily novolog 15 units subq three times daily with meals Interventions: Comprehensive med review Updated medications in EMR Provided diet and exercise counseling Collaborate with provider to adjust Novolog regimen and prevent afternoon hypoglycemia symptoms. Patient self care activities - Over the next 90 days, patient will: Check blood sugar 3-4 times daily, document, and provide at future appointments Contact provider with any episodes of hypoglycemia Increase exercise to goal of 30 minutes, 5 days weekly Avoid long periods of sitting    Medication management Pharmacist Clinical Goal(s): Over the next 90 days, patient will work with PharmD and providers to achieve optimal medication adherence Current pharmacy: [facility] Interventions Comprehensive medication review performed. Continue current medication management strategy Patient self care activities - Over the next 90 days, patient will: Focus  on medication adherence by utilizing pill box Take medications as prescribed Report any questions or concerns to PharmD and/or provider(s)   Please see past updates related to this goal by clicking on the "Past Updates" button in the selected goal           This is a list of the screening recommended for you and due dates:  Health Maintenance  Topic Date Due   COVID-19 Vaccine (6 - 2023-24 season) 02/23/2022   Eye exam for diabetics  04/14/2022   Flu Shot  10/18/2022   Complete foot exam   03/15/2023   Hemoglobin A1C  03/22/2023   Yearly kidney function blood test for diabetes  06/13/2023   Yearly kidney health urinalysis for diabetes  06/13/2023   DEXA scan (bone density measurement)  10/09/2023   Medicare Annual Wellness Visit  10/09/2023   Mammogram  07/29/2024   Colon Cancer Screening  11/30/2025   DTaP/Tdap/Td vaccine (5 - Td or Tdap) 06/28/2032   Pneumonia Vaccine  Completed   Hepatitis C Screening  Completed   Zoster (Shingles) Vaccine  Completed   HPV Vaccine  Aged Out    Advanced directives: no  Conditions/risks identified: none  Next appointment: Follow up in one year for your annual wellness visit 10/15/23 @ 10:00 am by phone   Preventive Care 65 Years and Older, Female Preventive care refers to lifestyle choices and visits with your health care provider that can promote health and wellness. What does preventive care include? A yearly physical exam. This is also called an annual well check. Dental exams once or twice a year. Routine eye exams. Ask your health care provider how often you should have your eyes checked. Personal lifestyle choices, including: Daily care of your teeth and gums. Regular physical activity. Eating a healthy diet. Avoiding tobacco and drug use. Limiting alcohol use. Practicing safe sex. Taking low-dose aspirin every day. Taking vitamin and mineral supplements as recommended by your health care provider. What happens during an annual well check? The services and screenings done by your health care provider during your annual well check will depend on your age, overall health, lifestyle risk factors, and family history of disease. Counseling  Your health care provider may ask  you questions about your: Alcohol use. Tobacco use. Drug use. Emotional well-being. Home and relationship well-being. Sexual activity. Eating habits. History of falls. Memory and ability to understand (cognition). Work and work Astronomer. Reproductive health. Screening  You may have the following tests or measurements: Height, weight, and BMI. Blood pressure. Lipid and cholesterol levels. These may be checked every 5 years, or more frequently if you are over 55 years old. Skin check. Lung cancer screening. You may have this screening every year starting at age 75 if you have a 30-pack-year history of smoking and currently smoke or have quit within the past 15 years. Fecal occult blood test (FOBT) of the stool. You may have this test every year starting at age 38. Flexible sigmoidoscopy or colonoscopy. You may have a sigmoidoscopy every 5 years or a colonoscopy every 10 years starting at age 31. Hepatitis C blood test. Hepatitis B blood test. Sexually transmitted disease (STD) testing. Diabetes screening. This is done by checking your blood sugar (glucose) after you have not eaten for a while (fasting). You may have this done every 1-3 years. Bone density scan. This is done to screen for osteoporosis. You may have this done starting at age 62. Mammogram. This may be done every 1-2 years.  Talk to your health care provider about how often you should have regular mammograms. Talk with your health care provider about your test results, treatment options, and if necessary, the need for more tests. Vaccines  Your health care provider may recommend certain vaccines, such as: Influenza vaccine. This is recommended every year. Tetanus, diphtheria, and acellular pertussis (Tdap, Td) vaccine. You may need a Td booster every 10 years. Zoster vaccine. You may need this after age 43. Pneumococcal 13-valent conjugate (PCV13) vaccine. One dose is recommended after age 41. Pneumococcal  polysaccharide (PPSV23) vaccine. One dose is recommended after age 41. Talk to your health care provider about which screenings and vaccines you need and how often you need them. This information is not intended to replace advice given to you by your health care provider. Make sure you discuss any questions you have with your health care provider. Document Released: 04/01/2015 Document Revised: 11/23/2015 Document Reviewed: 01/04/2015 Elsevier Interactive Patient Education  2017 [employer].  Fall Prevention in the Home Falls can cause injuries. They can happen to people of all ages. There are many things you can do to make your home safe and to help prevent falls. What can I do on the outside of my home? Regularly fix the edges of walkways and driveways and fix any cracks. Remove anything that might make you trip as you walk through a door, such as a raised step or threshold. Trim any bushes or trees on the path to your home. Use bright outdoor lighting. Clear any walking paths of anything that might make someone trip, such as rocks or tools. Regularly check to see if handrails are loose or broken. Make sure that both sides of any steps have handrails. Any raised decks and porches should have guardrails on the edges. Have any leaves, snow, or ice cleared regularly. Use sand or salt on walking paths during winter. Clean up any spills in your garage right away. This includes oil or grease spills. What can I do in the bathroom? Use night lights. Install grab bars by the toilet and in the tub and shower. Do not use towel bars as grab bars. Use non-skid mats or decals in the tub or shower. If you need to sit down in the shower, use a plastic, non-slip stool. Keep the floor dry. Clean up any water that spills on the floor as soon as it happens. Remove soap buildup in the tub or shower regularly. Attach bath mats securely with double-sided non-slip rug tape. Do not have throw rugs and other  things on the floor that can make you trip. What can I do in the bedroom? Use night lights. Make sure that you have a light by your bed that is easy to reach. Do not use any sheets or blankets that are too big for your bed. They should not hang down onto the floor. Have a firm chair that has side arms. You can use this for support while you get dressed. Do not have throw rugs and other things on the floor that can make you trip. What can I do in the kitchen? Clean up any spills right away. Avoid walking on wet floors. Keep items that you use a lot in easy-to-reach places. If you need to reach something above you, use a strong step stool that has a grab bar. Keep electrical cords out of the way. Do not use floor polish or wax that makes floors slippery. If you must use wax, use non-skid floor wax.  Do not have throw rugs and other things on the floor that can make you trip. What can I do with my stairs? Do not leave any items on the stairs. Make sure that there are handrails on both sides of the stairs and use them. Fix handrails that are broken or loose. Make sure that handrails are as long as the stairways. Check any carpeting to make sure that it is firmly attached to the stairs. Fix any carpet that is loose or worn. Avoid having throw rugs at the top or bottom of the stairs. If you do have throw rugs, attach them to the floor with carpet tape. Make sure that you have a light switch at the top of the stairs and the bottom of the stairs. If you do not have them, ask someone to add them for you. What else can I do to help prevent falls? Wear shoes that: Do not have high heels. Have rubber bottoms. Are comfortable and fit you well. Are closed at the toe. Do not wear sandals. If you use a stepladder: Make sure that it is fully opened. Do not climb a closed stepladder. Make sure that both sides of the stepladder are locked into place. Ask someone to hold it for you, if possible. Clearly  mark and make sure that you can see: Any grab bars or handrails. First and last steps. Where the edge of each step is. Use tools that help you move around (mobility aids) if they are needed. These include: Canes. Walkers. Scooters. Crutches. Turn on the lights when you go into a dark area. Replace any light bulbs as soon as they burn out. Set up your furniture so you have a clear path. Avoid moving your furniture around. If any of your floors are uneven, fix them. If there are any pets around you, be aware of where they are. Review your medicines with your doctor. Some medicines can make you feel dizzy. This can increase your chance of falling. Ask your doctor what other things that you can do to help prevent falls. This information is not intended to replace advice given to you by your health care provider. Make sure you discuss any questions you have with your health care provider. Document Released: 12/30/2008 Document Revised: 08/11/2015 Document Reviewed: 04/09/2014 Elsevier Interactive Patient Education  2017 [employer].

## 2022-10-15 ENCOUNTER — Ambulatory Visit (INDEPENDENT_AMBULATORY_CARE_PROVIDER_SITE_OTHER): Payer: Medicare HMO | Admitting: Nurse Practitioner

## 2022-10-15 ENCOUNTER — Encounter (INDEPENDENT_AMBULATORY_CARE_PROVIDER_SITE_OTHER): Payer: Self-pay | Admitting: Nurse Practitioner

## 2022-10-15 VITALS — BP 109/60 | HR 80 | Resp 16 | Wt 153.0 lb

## 2022-10-15 DIAGNOSIS — I8310 Varicose veins of unspecified lower extremity with inflammation: Secondary | ICD-10-CM

## 2022-10-15 NOTE — Progress Notes (Signed)
Subjective:    Patient ID: Kara Leach, female    DOB: Jan 16, 1951, 72 y.o.   MRN: [id number] Chief Complaint  Patient presents with   Follow-up    4 week post laser    The patient returns to the office for followup status post laser ablation of the right saphenous vein on right.  The patient note significant improvement in the lower extremity pain but not resolution of the symptoms. The patient notes multiple residual varicosities bilaterally which continued to hurt with dependent positions and remained tender to palpation. The patient's swelling is minimally from preoperative status. The patient continues to wear graduated compression stockings on a daily basis but these are not eliminating the pain and discomfort. The patient continues to use over-the-counter anti-inflammatory medications to treat the pain and related symptoms but this has not given the patient relief. The patient notes the pain in the lower extremities is causing problems with daily exercise, problems at work and even with household activities such as preparing meals and doing dishes.  The patient is otherwise done well and there have been no complications related to the laser procedure or interval changes in the patient's overall   Post laser ultrasound shows successful ablation of the right gsv      Review of Systems  Cardiovascular:  Positive for leg swelling.  All other systems reviewed and are negative.      Objective:   Physical Exam Vitals reviewed.  HENT:     Head: Normocephalic.  Cardiovascular:     Rate and Rhythm: Normal rate.     Pulses: Normal pulses.  Pulmonary:     Effort: Pulmonary effort is normal.  Skin:    General: Skin is warm and dry.  Neurological:     Mental Status: She is alert and oriented to person, place, and time.  Psychiatric:        Mood and Affect: Mood normal.        Behavior: Behavior normal.        Thought Content: Thought content normal.        Judgment: Judgment  normal.     BP 109/60 (BP Location: Left Arm)   Pulse 80   Resp 16   Wt 153 lb (69.4 kg)   LMP  (LMP Unknown)   BMI 29.88 kg/m   Past Medical History:  Diagnosis Date   Allergy    Anxiety    Arthritis    Osteoarthritis   Cancer (HCC) 05/2018   possibly on left breast but not sure   Chronic kidney disease    Depression    Diabetes mellitus without complication (HCC) 2012   Diabetic neuropathy (HCC)    feet   Diverticulitis 2017   GERD (gastroesophageal reflux disease)    Headache    sinus and migraines on occasion   Hyperlipidemia    Hypertension    Last menstrual period (LMP) > 10 days ago 1986   Obesity    Peripheral vascular disease (HCC)    neuropathy in hands and feet d/t diabetes   Renal insufficiency    Stage 2 kidney disease   Seizures (HCC)    due to high blood sugars. last time = 09/2014   Sleep apnea    has CPAP, can't tolerate   Stroke (HCC) 1980   minor. left side remains weaker than right   Wears hearing aid    right side   Wrist weakness    left - after fracture and repair  Social History   Socioeconomic History   Marital status: Widowed    Spouse name: Not on file   Number of children: Not on file   Years of education: Not on file   Highest education level: Not on file  Occupational History   Occupation: worked at [employer] for 41 years    Comment: retired  Tobacco Use   Smoking status: Never   Smokeless tobacco: Never  Vaping Use   Vaping status: Never Used  Substance and Sexual Activity   Alcohol use: Yes    Comment: occasionally holiday, weddings   Drug use: No   Sexual activity: Never  Other Topics Concern   Not on file  Social History Narrative   Not on file   Social Determinants of Health   Financial Resource Strain: Low Risk  (10/09/2022)   Overall Financial Resource Strain (CARDIA)    Difficulty of Paying Living Expenses: Not hard at all  Food Insecurity: No Food Insecurity (10/09/2022)   Hunger Vital Sign    Worried  About Running Out of Food in the Last Year: Never true    Ran Out of Food in the Last Year: Never true  Transportation Needs: No Transportation Needs (10/09/2022)   PRAPARE - Administrator, Civil Service (Medical): No    Lack of Transportation (Non-Medical): No  Physical Activity: Insufficiently Active (10/09/2022)   Exercise Vital Sign    Days of Exercise per Week: 3 days    Minutes of Exercise per Session: 30 min  Stress: No Stress Concern Present (10/09/2022)   [employer] of Occupational Health - Occupational Stress Questionnaire    Feeling of Stress : Not at all  Social Connections: Moderately Isolated (10/09/2022)   Social Connection and Isolation Panel [NHANES]    Frequency of Communication with Friends and Family: More than three times a week    Frequency of Social Gatherings with Friends and Family: More than three times a week    Attends Religious Services: More than 4 times per year    Active Member of [employer] or Organizations: No    Attends Banker Meetings: Never    Marital Status: Widowed  Intimate Partner Violence: Not At Risk (10/09/2022)   Humiliation, Afraid, Rape, and Kick questionnaire    Fear of Current or Ex-Partner: No    Emotionally Abused: No    Physically Abused: No    Sexually Abused: No    Past Surgical History:  Procedure Laterality Date   ABDOMINAL HYSTERECTOMY  1986   APPENDECTOMY  1964   BREAST BIOPSY Left 07/03/2016   benign, fibrocystic changes with calcifications, usual ductal hyperplasia.    BREAST BIOPSY Left 04/23/2018   radial scar   BREAST BIOPSY Left 08/13/2018   Procedure: WIDE LOCALIZATION AND OPEN BIOPSY OF LEFT BREAST, DIABETIC;  Surgeon: Earline Mayotte, MD;  Location: [facility];  Service: General;  Laterality: Left;   BREAST EXCISIONAL BIOPSY Left 08/13/2018   CHOLECYSTECTOMY  2012   COLONOSCOPY WITH PROPOFOL N/A 12/01/2015   Procedure: COLONOSCOPY WITH PROPOFOL;  Surgeon: Midge Minium, MD;  Location:  [facility];  Service: Endoscopy;  Laterality: N/A;  Diabetic - insulin Sleep apnea   PATELLA FRACTURE SURGERY Left    1. had to recenter kneecap, 2 and 3 surgeries were arthroscopies   WRIST FRACTURE SURGERY Left 2005   Steel plate    Family History  Problem Relation Age of Onset   Diabetes Mother    Heart disease Mother  Hypertension Mother    Cancer Father        stomach   Diabetes Sister    Arthritis Sister    Stroke Sister    Hypertension Sister    Breast cancer Maternal Aunt 69       great aunt   Parkinson's disease Maternal Grandmother    Tremor Maternal Grandmother     Allergies  Allergen Reactions   Strawberry Extract Swelling    Lips swell and rash/whelps over body Difficulty breathing   Ibuprofen Other (See Comments)    "ringing in ears", dizziness Also, Advil / Aleve / Motrin   Cat Hair Extract Other (See Comments)    Sneezing       Latest Ref Rng & Units 03/14/2022   10:08 AM 01/18/2022   11:58 AM 02/27/2021   10:03 AM  CBC  WBC 3.4 - 10.8 x10E3/uL 8.6  7.2  9.6   Hemoglobin 11.1 - 15.9 g/dL 08.6  57.8  46.9   Hematocrit 34.0 - 46.6 % 42.8  41.1  40.3   Platelets 150 - 450 x10E3/uL 286  261  246       CMP     Component Value Date/Time   NA 144 06/13/2022 0953   NA 129 (L) 06/15/2014 1054   K 4.4 06/13/2022 0953   K 4.6 06/15/2014 1054   CL 104 06/13/2022 0953   CL 100 (L) 06/15/2014 1054   CO2 24 06/13/2022 0953   CO2 24 06/15/2014 1054   GLUCOSE 93 06/13/2022 0953   GLUCOSE 135 (H) 01/18/2022 1158   GLUCOSE 405 (H) 06/15/2014 1054   BUN 13 06/13/2022 0953   BUN 23 (H) 06/15/2014 1054   CREATININE 1.15 (H) 06/13/2022 0953   CREATININE 1.07 (H) 06/15/2014 1054   CALCIUM 9.6 06/13/2022 0953   CALCIUM 9.1 06/15/2014 1054   PROT 7.1 06/13/2022 0953   PROT 8.0 11/12/2013 0021   ALBUMIN 4.2 06/13/2022 0953   ALBUMIN 3.5 11/12/2013 0021   AST 60 (H) 06/13/2022 0953   AST 45 (H) 05/15/2018 0931   AST 30 11/12/2013 0021   ALT  25 06/13/2022 0953   ALT 25 05/15/2018 0931   ALT 33 11/12/2013 0021   ALKPHOS 109 06/13/2022 0953   ALKPHOS 167 (H) 11/12/2013 0021   BILITOT <0.2 06/13/2022 0953   BILITOT 0.7 11/12/2013 0021   EGFR 52 (L) 06/13/2022 0953   GFRNONAA 54 (L) 01/18/2022 1158   GFRNONAA 56 (L) 06/15/2014 1054     No results found.     Assessment & Plan:   1. Varicose veins with inflammation Recommend:  The patient has had successful ablation of the previously incompetent saphenous venous system but still has persistent symptoms of pain and swelling that are having a negative impact on daily life and daily activities.  Patient should undergo injection sclerotherapy to treat the residual varicosities.  The risks, benefits and alternative therapies were reviewed in detail with the patient.  All questions were answered.  The patient agrees to proceed with sclerotherapy at their convenience.  The patient will continue wearing the graduated compression stockings and using the over-the-counter pain medications to treat her symptoms.       Current Outpatient Medications on File Prior to Visit  Medication Sig Dispense Refill   acetaminophen (TYLENOL) 650 MG CR tablet Take 650 mg by mouth every 8 (eight) hours as needed for pain.     allopurinol (ZYLOPRIM) 100 MG tablet Take 1 tablet (100 mg total) by mouth daily.  90 tablet 3   aspirin EC 81 MG tablet Take 81 mg by mouth daily.     BD PEN NEEDLE NANO U/F 32G X 4 MM MISC USE EVERY MORNING 100 each 12   Blood Glucose Monitoring Suppl (ONETOUCH VERIO REFLECT) w/Device KIT USE TO CHECK BLOOD SUGAR DAILY     Calcium Carb-Cholecalciferol (CALCIUM 600 + D PO) Take 1 tablet by mouth 2 (two) times daily. 1000 IU VITAMIN D     dapagliflozin propanediol (FARXIGA) 10 MG TABS tablet Take 10 mg by mouth daily.     enalapril (VASOTEC) 2.5 MG tablet Take 1 tablet (2.5 mg total) by mouth daily. 90 tablet 4   fluticasone (FLONASE) 50 MCG/ACT nasal spray PLACE 2 SPRAYS  INTO BOTH NOSTRILS DAILY AS NEEDED FOR ALLERGIES. 48 mL 3   glucose blood (ONETOUCH ULTRA) test strip USE TO CHECK BLOOD SUGAR TWICE DAILY 100 strip 8   insulin aspart (NOVOLOG FLEXPEN) 100 UNIT/ML FlexPen INJECT 12-14 UNITS UNDER THE SKIN 3 TIMES DAILY WITH MEALS 15 mL 0   Insulin Degludec (TRESIBA FLEXTOUCH [place]) Inject 47 Units into the skin at bedtime.     Insulin Pen Needle (B-D ULTRAFINE III SHORT PEN) 31G X 8 MM MISC 1 each by Does not apply route 3 (three) times daily. 100 each 12   Lancets (ONETOUCH DELICA PLUS LANCET30G) MISC Use to check blood sugars 3 to 4 times a day. 200 each 4   Loratadine (CLARITIN PO) Take 10 mg by mouth as needed.     MELATONIN PO Take by mouth as needed.     metoprolol succinate (TOPROL-XL) 25 MG 24 hr tablet Take 1 tablet (25 mg total) by mouth daily. 90 tablet 4   nystatin (MYCOSTATIN/NYSTOP) powder Apply 1 Application topically 3 (three) times daily. 15 g 4   Semaglutide, 1 MG/DOSE, 4 MG/3ML SOPN Inject 1 mg as directed once a week. 3 mL 12   sertraline (ZOLOFT) 100 MG tablet Take 1 tablet (100 mg total) by mouth every morning. 90 tablet 4   simvastatin (ZOCOR) 40 MG tablet TAKE 1 TABLET BY MOUTH EVERYDAY AT BEDTIME 90 tablet 4   Specialty Vitamins Products (VITA-RX DIABETIC VITAMIN) CAPS Take 1 packet by mouth daily. Diabetic Vitamins - 6 or 7 in 1 pack     No current facility-administered medications on file prior to visit.    There are no Patient Instructions on file for this visit. No follow-ups on file.   Georgiana Spinner, NP

## 2022-10-22 ENCOUNTER — Other Ambulatory Visit: Payer: Self-pay | Admitting: Nurse Practitioner

## 2022-10-23 NOTE — Telephone Encounter (Signed)
Requested Prescriptions  Pending Prescriptions Disp Refills   insulin aspart (NOVOLOG FLEXPEN) 100 UNIT/ML FlexPen [Pharmacy Med Name: NOVOLOG 100 UNIT/ML FLEXPEN] 15 mL 0    Sig: INJECT 12-14 UNITS UNDER THE SKIN 3 TIMES DAILY WITH MEALS     Endocrinology:  Diabetes - Insulins Passed - 10/22/2022  2:45 AM      Passed - HBA1C is between 0 and 7.9 and within 180 days    Hemoglobin A1C  Date Value Ref Range Status  11/12/2013 13.4 (H) 4.2 - 6.3 % Final    Comment:    The American Diabetes Association recommends that a primary goal of therapy should be <7% and that physicians should reevaluate the treatment regimen in patients with HbA1c values consistently >8%.    HB A1C (BAYER DCA - WAIVED)  Date Value Ref Range Status  09/19/2022 6.7 (H) 4.8 - 5.6 % Final    Comment:             Prediabetes: 5.7 - 6.4          Diabetes: >6.4          Glycemic control for adults with diabetes: <7.0          Passed - Valid encounter within last 6 months    Recent Outpatient Visits           1 month ago Type 2 diabetes mellitus with stage 3b chronic kidney disease, with long-term current use of insulin (HCC)   [place] [facility] [place], [place] T, NP   3 months ago Laceration of head without foreign body, unspecified part of head, subsequent encounter   [place] [facility] Larae Grooms, NP   4 months ago Type 2 diabetes mellitus with stage 3b chronic kidney disease, with long-term current use of insulin (HCC)   [place] [facility] [place], [place] Di­az T, NP   6 months ago Type 2 diabetes mellitus with stage 3b chronic kidney disease, with long-term current use of insulin (HCC)   [place] [facility] [place], [place] T, NP   7 months ago Type 2 diabetes mellitus with stage 3b chronic kidney disease, with long-term current use of insulin (HCC)   [place] [facility] [place], Dorie Rank, NP       Future Appointments              In 1 month [place], Dorie Rank, NP [place] [facility]

## 2022-10-30 ENCOUNTER — Telehealth (INDEPENDENT_AMBULATORY_CARE_PROVIDER_SITE_OTHER): Payer: Self-pay

## 2022-10-30 NOTE — Telephone Encounter (Signed)
LVM for pt to return call to schedule sclero

## 2022-11-01 ENCOUNTER — Telehealth (INDEPENDENT_AMBULATORY_CARE_PROVIDER_SITE_OTHER): Payer: Self-pay

## 2022-11-01 NOTE — Telephone Encounter (Signed)
LVM for pt to call back to get sclero appts scheduled with Vivia [place] brown

## 2022-12-06 DIAGNOSIS — E1122 Type 2 diabetes mellitus with diabetic chronic kidney disease: Secondary | ICD-10-CM | POA: Diagnosis not present

## 2022-12-06 DIAGNOSIS — N1831 Chronic kidney disease, stage 3a: Secondary | ICD-10-CM | POA: Diagnosis not present

## 2022-12-06 DIAGNOSIS — I10 Essential (primary) hypertension: Secondary | ICD-10-CM | POA: Diagnosis not present

## 2022-12-13 ENCOUNTER — Telehealth (INDEPENDENT_AMBULATORY_CARE_PROVIDER_SITE_OTHER): Payer: Self-pay

## 2022-12-13 NOTE — Telephone Encounter (Signed)
Tried to call pt to schedule sclero appts no VM set up

## 2022-12-16 NOTE — Patient Instructions (Signed)
 Be Involved in Caring For Your Health:  Taking Medications When medications are taken as directed, they can greatly improve your health. But if they are not taken as prescribed, they may not work. In some cases, not taking them correctly can be harmful. To help ensure your treatment remains effective and safe, understand your medications and how to take them. Bring your medications to each visit for review by your provider.  Your lab results, notes, and after visit summary will be available on My Chart. We strongly encourage you to use this feature. If lab results are abnormal the clinic will contact you with the appropriate steps. If the clinic does not contact you assume the results are satisfactory. You can always view your results on My Chart. If you have questions regarding your health or results, please contact the clinic during office hours. You can also ask questions on My Chart.  We at [facility] are grateful that you chose [place] to provide your care. We strive to provide evidence-based and compassionate care and are always looking for feedback. If you get a survey from the clinic please complete this so we can hear your opinions.  Diabetes Mellitus and Nutrition, Adult When you have diabetes, or diabetes mellitus, it is very important to have healthy eating habits because your blood sugar (glucose) levels are greatly affected by what you eat and drink. Eating healthy foods in the right amounts, at about the same times every day, can help you: Manage your blood glucose. Lower your risk of heart disease. Improve your blood pressure. Reach or maintain a healthy weight. What can affect my meal plan? Every person with diabetes is different, and each person has different needs for a meal plan. Your health care provider may recommend that you work with a dietitian to make a meal plan that is best for you. Your meal plan may vary depending on factors such as: The calories you need. The  medicines you take. Your weight. Your blood glucose, blood pressure, and cholesterol levels. Your activity level. Other health conditions you have, such as heart or kidney disease. How do carbohydrates affect me? Carbohydrates, also called carbs, affect your blood glucose level more than any other type of food. Eating carbs raises the amount of glucose in your blood. It is important to know how many carbs you can safely have in each meal. This is different for every person. Your dietitian can help you calculate how many carbs you should have at each meal and for each snack. How does alcohol affect me? Alcohol can cause a decrease in blood glucose (hypoglycemia), especially if you use insulin or take certain diabetes medicines by mouth. Hypoglycemia can be a life-threatening condition. Symptoms of hypoglycemia, such as sleepiness, dizziness, and confusion, are similar to symptoms of having too much alcohol. Do not drink alcohol if: Your health care provider tells you not to drink. You are pregnant, may be pregnant, or are planning to become pregnant. If you drink alcohol: Limit how much you have to: 0-1 drink a day for women. 0-2 drinks a day for men. Know how much alcohol is in your drink. In the [place] one drink equals one 12 oz bottle of beer (355 mL), one 5 oz glass of wine (148 mL), or one 1 oz glass of hard liquor (44 mL). Keep yourself hydrated with water, diet soda, or unsweetened iced tea. Keep in mind that regular soda, juice, and other mixers may contain a lot of sugar and must  be counted as carbs. What are tips for following this plan?  Reading food labels Start by checking the serving size on the Nutrition Facts label of packaged foods and drinks. The number of calories and the amount of carbs, fats, and other nutrients listed on the label are based on one serving of the item. Many items contain more than one serving per package. Check the total grams (g) of carbs in one  serving. Check the number of grams of saturated fats and trans fats in one serving. Choose foods that have a low amount or none of these fats. Check the number of milligrams (mg) of salt (sodium) in one serving. Most people should limit total sodium intake to less than 2,300 mg per day. Always check the nutrition information of foods labeled as "low-fat" or "nonfat." These foods may be higher in added sugar or refined carbs and should be avoided. Talk to your dietitian to identify your daily goals for nutrients listed on the label. Shopping Avoid buying canned, pre-made, or processed foods. These foods tend to be high in fat, sodium, and added sugar. Shop around the outside edge of the grocery store. This is where you will most often find fresh fruits and vegetables, bulk grains, fresh meats, and fresh dairy products. Cooking Use low-heat cooking methods, such as baking, instead of high-heat cooking methods, such as deep frying. Cook using healthy oils, such as olive, canola, or sunflower oil. Avoid cooking with butter, cream, or high-fat meats. Meal planning Eat meals and snacks regularly, preferably at the same times every day. Avoid going long periods of time without eating. Eat foods that are high in fiber, such as fresh fruits, vegetables, beans, and whole grains. Eat 4-6 oz (112-168 g) of lean protein each day, such as lean meat, chicken, fish, eggs, or tofu. One ounce (oz) (28 g) of lean protein is equal to: 1 oz (28 g) of meat, chicken, or fish. 1 egg.  cup (62 g) of tofu. Eat some foods each day that contain healthy fats, such as avocado, nuts, seeds, and fish. What foods should I eat? Fruits Berries. Apples. Oranges. Peaches. Apricots. Plums. Grapes. Mangoes. Papayas. Pomegranates. Kiwi. Cherries. Vegetables Leafy greens, including lettuce, spinach, kale, chard, collard greens, mustard greens, and cabbage. Beets. Cauliflower. Broccoli. Carrots. Green beans. Tomatoes. Peppers.  Onions. Cucumbers. Brussels sprouts. Grains Whole grains, such as whole-wheat or whole-grain bread, crackers, tortillas, cereal, and pasta. Unsweetened oatmeal. Quinoa. Brown or wild rice. Meats and other proteins Seafood. Poultry without skin. Lean cuts of poultry and beef. Tofu. Nuts. Seeds. Dairy Low-fat or fat-free dairy products such as milk, yogurt, and cheese. The items listed above may not be a complete list of foods and beverages you can eat and drink. Contact a dietitian for more information. What foods should I avoid? Fruits Fruits canned with syrup. Vegetables Canned vegetables. Frozen vegetables with butter or cream sauce. Grains Refined white flour and flour products such as bread, pasta, snack foods, and cereals. Avoid all processed foods. Meats and other proteins Fatty cuts of meat. Poultry with skin. Breaded or fried meats. Processed meat. Avoid saturated fats. Dairy Full-fat yogurt, cheese, or milk. Beverages Sweetened drinks, such as soda or iced tea. The items listed above may not be a complete list of foods and beverages you should avoid. Contact a dietitian for more information. Questions to ask a health care provider Do I need to meet with a certified diabetes care and education specialist? Do I need to meet with a  dietitian? What number can I call if I have questions? When are the best times to check my blood glucose? Where to find more information: American Diabetes Association: diabetes.org Academy of Nutrition and Dietetics: eatright[employer] of Diabetes and Digestive and Kidney Diseases: StageSync.si Association of Diabetes Care & Education Specialists: diabeteseducator.org Summary It is important to have healthy eating habits because your blood sugar (glucose) levels are greatly affected by what you eat and drink. It is important to use alcohol carefully. A healthy meal plan will help you manage your blood glucose and lower your risk of  heart disease. Your health care provider may recommend that you work with a dietitian to make a meal plan that is best for you. This information is not intended to replace advice given to you by your health care provider. Make sure you discuss any questions you have with your health care provider. Document Revised: 10/07/2019 Document Reviewed: 10/07/2019 Elsevier Patient Education  2024 [employer].

## 2022-12-20 ENCOUNTER — Ambulatory Visit (INDEPENDENT_AMBULATORY_CARE_PROVIDER_SITE_OTHER): Payer: Medicare HMO | Admitting: Nurse Practitioner

## 2022-12-20 ENCOUNTER — Encounter: Payer: Self-pay | Admitting: Nurse Practitioner

## 2022-12-20 VITALS — BP 129/73 | HR 83 | Temp 98.1°F | Ht 60.0 in | Wt 152.2 lb

## 2022-12-20 DIAGNOSIS — Z23 Encounter for immunization: Secondary | ICD-10-CM | POA: Diagnosis not present

## 2022-12-20 DIAGNOSIS — E1159 Type 2 diabetes mellitus with other circulatory complications: Secondary | ICD-10-CM

## 2022-12-20 DIAGNOSIS — E785 Hyperlipidemia, unspecified: Secondary | ICD-10-CM

## 2022-12-20 DIAGNOSIS — N3281 Overactive bladder: Secondary | ICD-10-CM

## 2022-12-20 DIAGNOSIS — D472 Monoclonal gammopathy: Secondary | ICD-10-CM

## 2022-12-20 DIAGNOSIS — I152 Hypertension secondary to endocrine disorders: Secondary | ICD-10-CM | POA: Diagnosis not present

## 2022-12-20 DIAGNOSIS — E66811 Obesity, class 1: Secondary | ICD-10-CM

## 2022-12-20 DIAGNOSIS — E1122 Type 2 diabetes mellitus with diabetic chronic kidney disease: Secondary | ICD-10-CM

## 2022-12-20 DIAGNOSIS — F324 Major depressive disorder, single episode, in partial remission: Secondary | ICD-10-CM

## 2022-12-20 DIAGNOSIS — E1169 Type 2 diabetes mellitus with other specified complication: Secondary | ICD-10-CM

## 2022-12-20 DIAGNOSIS — Z794 Long term (current) use of insulin: Secondary | ICD-10-CM | POA: Diagnosis not present

## 2022-12-20 DIAGNOSIS — N1832 Chronic kidney disease, stage 3b: Secondary | ICD-10-CM | POA: Diagnosis not present

## 2022-12-20 DIAGNOSIS — E6609 Other obesity due to excess calories: Secondary | ICD-10-CM

## 2022-12-20 MED ORDER — SOLIFENACIN SUCCINATE 5 MG PO TABS
5.0000 mg | ORAL_TABLET | Freq: Every day | ORAL | 4 refills | Status: DC
Start: 2022-12-20 — End: 2023-01-17

## 2022-12-20 NOTE — Assessment & Plan Note (Signed)
BMI 29.72, is losing weight with Ozempic on board.  Recommended eating smaller high protein, low fat meals more frequently and exercising 30 mins a day 5 times a week with a goal of 10-15lb weight loss in the next 3 months. Patient voiced their understanding and motivation to adhere to these recommendations.

## 2022-12-20 NOTE — Assessment & Plan Note (Signed)
Chronic, ongoing.  Continue current medication regimen and adjust as needed. Lipid panel today. 

## 2022-12-20 NOTE — Assessment & Plan Note (Signed)
Chronic, stable.  BP remains at goal in office and at home for her age.  Continue current medication regimen and adjust as needed.  Alert provider if BP consistently >130/80 at home and then may restart Amlodipine at 2.5 MG.  Continue Metoprolol and Enalapril, adjust as needed.  Focus on DASH diet.  Enalapril for kidney protection.  LABS: up to date with nephrology.

## 2022-12-20 NOTE — Progress Notes (Signed)
BP 129/73   Pulse 83   Temp 98.1 F (36.7 C) (Oral)   Ht 5' (1.524 m)   Wt 152 lb 3.2 oz (69 kg)   LMP  (LMP Unknown)   SpO2 95%   BMI 29.72 kg/m    Subjective:    Patient ID: Kara Leach, female    DOB: March 14, 1951, 72 y.o.   MRN: [id number]  HPI: Kara Leach is a 72 y.o. female  Chief Complaint  Patient presents with   Diabetes    Patient most recent Diabetic Eye Exam was requested at today's visit.    Hyperlipidemia   Hypertension   Chronic Kidney Disease   Mood   Urinary Incontinence   DIABETES A1c in March was 6.7%.  Continues on Farxiga 10 MG daily, Novolog, Tresiba, and Ozempic 1 MG weekly.   Hypoglycemic episodes: has had some 80's Polydipsia/polyuria: no Visual disturbance: no Chest pain: no Paresthesias: no Glucose Monitoring: yes             Accucheck frequency: BID             Fasting glucose: 90-100 in morning             Post prandial:             Evening: 120-130 in evening             Before meals: Taking Insulin?: yes             Long acting insulin: Tresiba 44 units             Short acting insulin: Novolog 12 units morning/afternoon & 14 at dinner Blood Pressure Monitoring: daily Retinal Examination: Up To Date -- last in February at [facility] Foot Exam: Up to Date Pneumovax: Up to Date Influenza: Up to Date Aspirin: yes    HYPERTENSION / HYPERLIPIDEMIA Continues on Enalapril, Metoprolol, and ASA + Simvastatin.  Follows with vascular, had varicose vein surgery on 09/17/22.  Last saw them on 10/15/22.  Amlodipine taken in past, but discontinued due to stable BP levels. Satisfied with current treatment? yes Duration of hypertension: chronic BP monitoring frequency: daily BP range: this morning 132/83 BP medication side effects: no Duration of hyperlipidemia: chronic Cholesterol medication side effects: no Cholesterol supplements: none Medication compliance: good compliance Aspirin: yes Recent stressors: no Recurrent  headaches: no Visual changes: no Palpitations: no Dyspnea: no Chest pain: no Lower extremity edema: no, none since varicose vein since surgery Dizzy/lightheaded: no The ASCVD Risk score (Arnett DK, et al., 2019) failed to calculate for the following reasons:   The patient has a prior MI or stroke diagnosis  CHRONIC KIDNEY DISEASE & MGUS GFR ranging from 39-60 over past >2 years. Sees Dr. Cherylann Leach, last visit 12/06/22. Labs noted CRT 1.09 and eGFR 55.  Is having issues with urinary incontinence, having to wear Attends due to this.  Does not give her any warning, just comes.  This has been present for some months, but has worsened since last visit.  Is taking Pumpkin Seed supplement which has helped a little.  Does get incontinence with sneezing and coughing too, but no urge issues.  Followed by providers at cancer center and last seen 01/25/22.  To continue yearly visits and labs with them. Progressing slowly and they are monitoring.   CKD status: stable Medications renally dose: yes Previous renal evaluation: no Pneumovax:  Up to Date Influenza Vaccine:  Up to Date    Latest Ref Rng &  Units 06/13/2022    9:53 AM 03/14/2022   10:08 AM 01/18/2022   11:58 AM  BMP  Glucose 70 - 99 mg/dL 93  161  096   BUN 8 - 27 mg/dL 13  18  18    Creatinine 0.57 - 1.00 mg/dL 0.45  4.09  8.11   BUN/Creat Ratio 12 - 28 11  16     Sodium 134 - 144 mmol/L 144  140  138   Potassium 3.5 - 5.2 mmol/L 4.4  4.5  4.3   Chloride 96 - 106 mmol/L 104  102  105   CO2 20 - 29 mmol/L 24  22  24    Calcium 8.7 - 10.3 mg/dL 9.6  91.4  9.8     DEPRESSION Continues on Sertraline 100 MG.  Winter months and holidays are tough for her. Mood status: stable Satisfied with current treatment?: yes Symptom severity: moderate  Duration of current treatment : chronic Side effects: no Medication compliance: good compliance Depressed mood: occasional Anxious mood: occasional Anhedonia: no Significant weight loss or gain:  no Insomnia: occasional Fatigue: no Feelings of worthlessness or guilt: no Impaired concentration/indecisiveness: no Suicidal ideations: no Hopelessness: no Crying spells: no    12/20/2022   10:48 AM 10/09/2022   10:12 AM 09/19/2022   11:20 AM 06/13/2022    9:57 AM 04/25/2022   10:59 AM  Depression screen PHQ 2/9  Decreased Interest 1 0 1 1 1   Down, Depressed, Hopeless 1 0 1 1 1   PHQ - 2 Score 2 0 2 2 2   Altered sleeping 1 0 1 2 2   Tired, decreased energy 1 0 1 1 2   Change in appetite 1 0 1 1 1   Feeling bad or failure about yourself  0 0 1 1 1   Trouble concentrating 0 0 1 0 0  Moving slowly or fidgety/restless 0 0 0 0 0  Suicidal thoughts 0 0 0 0 0  PHQ-9 Score 5 0 7 7 8   Difficult doing work/chores Somewhat difficult Not difficult at all Somewhat difficult Somewhat difficult Somewhat difficult       12/20/2022   10:49 AM 09/19/2022   11:21 AM 06/13/2022    9:58 AM 04/25/2022   11:00 AM  GAD 7 : Generalized Anxiety Score  Nervous, Anxious, on Edge 1 1 1 1   Control/stop worrying 1 1 1 1   Worry too much - different things 1 1 1 1   Trouble relaxing 0 1 1 1   Restless 0 0 0 0  Easily annoyed or irritable 0 0 1 0  Afraid - awful might happen 1 0 1 1  Total GAD 7 Score 4 4 6 5   Anxiety Difficulty Somewhat difficult Somewhat difficult Somewhat difficult Somewhat difficult    Relevant past medical, surgical, family and social history reviewed and updated as indicated. Interim medical history since our last visit reviewed. Allergies and medications reviewed and updated.  Review of Systems  Constitutional:  Negative for activity change, appetite change, diaphoresis, fatigue and fever.  Respiratory:  Negative for cough, chest tightness and shortness of breath.   Cardiovascular:  Negative for chest pain, palpitations and leg swelling.  Gastrointestinal: Negative.   Endocrine: Negative for cold intolerance, heat intolerance, polydipsia, polyphagia and polyuria.  Neurological: Negative.    Psychiatric/Behavioral: Negative.     Per HPI unless specifically indicated above     Objective:    BP 129/73   Pulse 83   Temp 98.1 F (36.7 C) (Oral)   Ht 5' (1.524  m)   Wt 152 lb 3.2 oz (69 kg)   LMP  (LMP Unknown)   SpO2 95%   BMI 29.72 kg/m   Wt Readings from Last 3 Encounters:  12/20/22 152 lb 3.2 oz (69 kg)  10/15/22 153 lb (69.4 kg)  10/09/22 155 lb (70.3 kg)    Physical Exam Vitals and nursing note reviewed.  Constitutional:      General: She is awake. She is not in acute distress.    Appearance: She is well-developed and well-groomed. She is obese. She is not ill-appearing.  HENT:     Head: Normocephalic.     Right Ear: Hearing normal.     Left Ear: Hearing normal.  Eyes:     General: Lids are normal.        Right eye: No discharge.        Left eye: No discharge.     Conjunctiva/sclera: Conjunctivae normal.     Pupils: Pupils are equal, round, and reactive to light.  Neck:     Thyroid: No thyromegaly.     Vascular: No carotid bruit.  Cardiovascular:     Rate and Rhythm: Normal rate and regular rhythm.     Heart sounds: Normal heart sounds. No murmur heard.    No gallop.     Comments:   Pulmonary:     Effort: Pulmonary effort is normal. No accessory muscle usage or respiratory distress.     Breath sounds: Normal breath sounds.  Abdominal:     General: Bowel sounds are normal.     Palpations: Abdomen is soft.     Tenderness: There is no abdominal tenderness.  Musculoskeletal:     Cervical back: Normal range of motion and neck supple.     Right lower leg: No edema.     Left lower leg: No edema.  Lymphadenopathy:     Cervical: No cervical adenopathy.  Skin:    General: Skin is warm and dry.  Neurological:     Mental Status: She is alert and oriented to person, place, and time.  Psychiatric:        Attention and Perception: Attention normal.        Mood and Affect: Mood normal.        Speech: Speech normal.        Behavior: Behavior normal.  Behavior is cooperative.        Thought Content: Thought content normal.    Results for orders placed or performed in visit on 09/19/22  Bayer DCA Hb A1c Waived  Result Value Ref Range   HB A1C (BAYER DCA - WAIVED) 6.7 (H) 4.8 - 5.6 %      Assessment & Plan:   Problem List Items Addressed This Visit       Cardiovascular and Mediastinum   Hypertension associated with diabetes (HCC) (Chronic)    Chronic, stable.  BP remains at goal in office and at home for her age.  Continue current medication regimen and adjust as needed.  Alert provider if BP consistently >130/80 at home and then may restart Amlodipine at 2.5 MG.  Continue Metoprolol and Enalapril, adjust as needed.  Focus on DASH diet.  Enalapril for kidney protection.  LABS: up to date with nephrology.       Relevant Orders   HgB A1c     Endocrine   Hyperlipidemia associated with type 2 diabetes mellitus (HCC) (Chronic)    Chronic, ongoing.  Continue current medication regimen and adjust as needed.  Lipid  panel today.      Relevant Orders   HgB A1c   Lipid Panel w/o Chol/HDL Ratio   Type 2 diabetes mellitus with stage 3b chronic kidney disease, with long-term current use of insulin (HCC) - Primary (Chronic)    Chronic, ongoing with A1c 6.7% in March, trending down, and urine ALB 27 May 2022.  Recheck A1c today, if reduces more will have her reduce insulin further.  Recommend she check sugars 2 hours after a meal + fasting in morning -- bring to visits.  Continue current medication regimen and adjust as needed.  Educated her on insulin and to reduce [place] by 3 units if sugars consistently in 70-80 range.  Goal in long run would be to reduce insulin, but at this time she wishes to maintain -- could increase Ozempic to 2 MG next visit if trends above 7%.   - Eye and foot exam up to date.   - Vaccines up to date.   - ACE and Statin on board.   - Return in 3 months.      Relevant Orders   HgB A1c     Genitourinary   OAB  (overactive bladder)    Ongoing for several months, but is worsening and affecting her quality of life -- worries about leaving home.  Educated her on OAB and stress incontinence.  Will trial Vesicare 5 MG daily, educated her on this and recommend she immediately alert provider if any anticholinergic effects present.  Would like to use Myrbetriq, but appears to be higher cost for her.  Discussed benefit of pelvic floor therapy, placed referral for this.  If ongoing in future, consider urology referral.      Relevant Orders   Ambulatory referral to Physical Therapy     Other   MGUS (monoclonal gammopathy of unknown significance) (Chronic)    Chronic, stable.  Continue collaboration with Dr. Orlie Dakin at [facility] - recent notes and labs reviewed.      Obesity (Chronic)    BMI 29.72, is losing weight with Ozempic on board.  Recommended eating smaller high protein, low fat meals more frequently and exercising 30 mins a day 5 times a week with a goal of 10-15lb weight loss in the next 3 months. Patient voiced their understanding and motivation to adhere to these recommendations.       Depression, major, single episode, in partial remission (HCC)    Chronic, stable.  Denies SI/HI.  Continue current medication regimen, Sertraline 100 MG daily, and adjust as needed. Recommend meditation and relaxation techniques at home.      Other Visit Diagnoses     Flu vaccine need       Flu vaccine due and provided to patient today.   Relevant Orders   Flu Vaccine Trivalent High Dose (Fluad)        Follow up plan: Return in about 4 weeks (around 01/17/2023) for OAB -- started Vesicare and Pelvic therapy + scheduled annual physical after 03/15/23.

## 2022-12-20 NOTE — Assessment & Plan Note (Signed)
Ongoing for several months, but is worsening and affecting her quality of life -- worries about leaving home.  Educated her on OAB and stress incontinence.  Will trial Vesicare 5 MG daily, educated her on this and recommend she immediately alert provider if any anticholinergic effects present.  Would like to use Myrbetriq, but appears to be higher cost for her.  Discussed benefit of pelvic floor therapy, placed referral for this.  If ongoing in future, consider urology referral.

## 2022-12-20 NOTE — Assessment & Plan Note (Signed)
Chronic, stable.  Denies SI/HI.  Continue current medication regimen, Sertraline 100 MG daily, and adjust as needed. Recommend meditation and relaxation techniques at home. 

## 2022-12-20 NOTE — Assessment & Plan Note (Signed)
Chronic, ongoing with A1c 6.7% in March, trending down, and urine ALB 27 May 2022.  Recheck A1c today, if reduces more will have her reduce insulin further.  Recommend she check sugars 2 hours after a meal + fasting in morning -- bring to visits.  Continue current medication regimen and adjust as needed.  Educated her on insulin and to reduce [place] by 3 units if sugars consistently in 70-80 range.  Goal in long run would be to reduce insulin, but at this time she wishes to maintain -- could increase Ozempic to 2 MG next visit if trends above 7%.   - Eye and foot exam up to date.   - Vaccines up to date.   - ACE and Statin on board.   - Return in 3 months.

## 2022-12-20 NOTE — Assessment & Plan Note (Signed)
Chronic, stable.  Continue collaboration with Dr. Finnegan at [facility] - recent notes and labs reviewed. 

## 2022-12-21 LAB — LIPID PANEL W/O CHOL/HDL RATIO
Cholesterol, Total: 126 mg/dL (ref 100–199)
HDL: 47 mg/dL (ref >39)
LDL Chol Calc (NIH): 57 mg/dL (ref 0–99)
Triglycerides: 122 mg/dL (ref 0–149)
VLDL Cholesterol Cal: 22 mg/dL (ref 5–40)

## 2022-12-21 LAB — HEMOGLOBIN A1C
Est. average glucose Bld gHb Est-mCnc: 117 mg/dL
Hgb A1c MFr Bld: 5.7 % — ABNORMAL HIGH (ref 4.8–5.6)

## 2022-12-21 NOTE — Progress Notes (Signed)
Good morning and welcome back, please let Kara Leach know her labs have returned: - A1c has trended down more to 5.7%, coming down more. I want her to reduce her Tresiba by 3 units every 3 days if morning fasting sugar is <130.  Stop when reach level that you are between 115 to 130 in morning.  Let me know if any questions about this. - Cholesterol levels remain great.  No changes needed there.  Any questions? Keep being amazing!!  Thank you for allowing me to participate in your care.  I appreciate you. Kindest regards, Fawn Desrocher

## 2022-12-25 ENCOUNTER — Encounter: Payer: Self-pay | Admitting: Nurse Practitioner

## 2023-01-03 ENCOUNTER — Other Ambulatory Visit: Payer: Self-pay | Admitting: Nurse Practitioner

## 2023-01-03 NOTE — Telephone Encounter (Signed)
Requested Prescriptions  Refused Prescriptions Disp Refills   OZEMPIC, 1 MG/DOSE, 4 MG/3ML SOPN [Pharmacy Med Name: OZEMPIC 4 MG/3 ML (1 MG/DOSE)]  4    Sig: INJECT 1 MG ONCE A WEEK AS DIRECTED     Endocrinology:  Diabetes - GLP-1 Receptor Agonists - semaglutide Failed - 01/03/2023  3:05 PM      Failed - HBA1C in normal range and within 180 days    Hemoglobin A1C  Date Value Ref Range Status  11/12/2013 13.4 (H) 4.2 - 6.3 % Final    Comment:    The American Diabetes Association recommends that a primary goal of therapy should be <7% and that physicians should reevaluate the treatment regimen in patients with HbA1c values consistently >8%.    HB A1C (BAYER DCA - WAIVED)  Date Value Ref Range Status  09/19/2022 6.7 (H) 4.8 - 5.6 % Final    Comment:             Prediabetes: 5.7 - 6.4          Diabetes: >6.4          Glycemic control for adults with diabetes: <7.0    Hgb A1c MFr Bld  Date Value Ref Range Status  12/20/2022 5.7 (H) 4.8 - 5.6 % Final    Comment:             Prediabetes: 5.7 - 6.4          Diabetes: >6.4          Glycemic control for adults with diabetes: <7.0          Failed - Cr in normal range and within 360 days    Creatinine  Date Value Ref Range Status  06/15/2014 1.07 (H) mg/dL Final    Comment:    8.29-5.62 NOTE: New Reference Range  05/25/14    Creatinine, Ser  Date Value Ref Range Status  06/13/2022 1.15 (H) 0.57 - 1.00 mg/dL Final         Passed - Valid encounter within last 6 months    Recent Outpatient Visits           2 weeks ago Type 2 diabetes mellitus with stage 3b chronic kidney disease, with long-term current use of insulin (HCC)   [place] [facility] [place], Gu[place] T, NP   3 months ago Type 2 diabetes mellitus with stage 3b chronic kidney disease, with long-term current use of insulin (HCC)   [place] [facility] [place], [place] T, NP   5 months ago Laceration of head without foreign body,  unspecified part of head, subsequent encounter   [place] [facility] Larae Grooms, NP   6 months ago Type 2 diabetes mellitus with stage 3b chronic kidney disease, with long-term current use of insulin (HCC)   [place] [facility] [place], [place] T, NP   8 months ago Type 2 diabetes mellitus with stage 3b chronic kidney disease, with long-term current use of insulin (HCC)   [place] [facility] [place], Dorie Rank, NP       Future Appointments             In 2 weeks [place], Dorie Rank, NP [place] [facility]   In 2 months [place], Dorie Rank, NP [place] [facility], [facility]

## 2023-01-13 NOTE — Patient Instructions (Signed)

## 2023-01-17 ENCOUNTER — Encounter: Payer: Self-pay | Admitting: Nurse Practitioner

## 2023-01-17 ENCOUNTER — Ambulatory Visit (INDEPENDENT_AMBULATORY_CARE_PROVIDER_SITE_OTHER): Payer: Medicare HMO | Admitting: Nurse Practitioner

## 2023-01-17 VITALS — BP 130/79 | HR 94 | Temp 98.2°F | Ht 60.0 in | Wt 151.0 lb

## 2023-01-17 DIAGNOSIS — N3281 Overactive bladder: Secondary | ICD-10-CM

## 2023-01-17 LAB — URINALYSIS, ROUTINE W REFLEX MICROSCOPIC
Bilirubin, UA: NEGATIVE
Ketones, UA: NEGATIVE
Nitrite, UA: NEGATIVE
Protein,UA: NEGATIVE
Specific Gravity, UA: 1.015 (ref 1.005–1.030)
Urobilinogen, Ur: 0.2 mg/dL (ref 0.2–1.0)
pH, UA: 6 (ref 5.0–7.5)

## 2023-01-17 LAB — MICROSCOPIC EXAMINATION

## 2023-01-17 MED ORDER — SOLIFENACIN SUCCINATE 10 MG PO TABS
10.0000 mg | ORAL_TABLET | Freq: Every day | ORAL | 4 refills | Status: DC
Start: 2023-01-17 — End: 2024-02-07

## 2023-01-17 NOTE — Progress Notes (Signed)
BP 130/79 (BP Location: Left Arm, Patient Position: Sitting, Cuff Size: Normal)   Pulse 94   Temp 98.2 F (36.8 C) (Oral)   Ht 5' (1.524 m)   Wt 151 lb (68.5 kg)   LMP  (LMP Unknown)   SpO2 96%   BMI 29.49 kg/m    Subjective:    Patient ID: Kara Leach, female    DOB: [birth year], 72 y.o.   MRN: [id number]  HPI: Kara Leach is a 72 y.o. female  Chief Complaint  Patient presents with   Over Active Bladder    Vesicare started on 12/20/22   OAB Presents today for follow-up on Vesicare which was started on 12/20/22. She reports this is working well.  Is noticing reduction in urination, less frequent.  Every 2-3 hours vs every 5 minutes previously. Continues to wear Attends.  Sometimes now she is getting urge feeling to go, previously it just happened.  Continues to notice dribbling if coughs or sneeze.  Ordered pelvic PT and is scheduled for January. Dysuria: no Urinary frequency: no Urgency: yes Small volume voids: no Symptom severity: yes Urinary incontinence: yes Foul odor: yes Hematuria: no Abdominal pain: no Back pain: no Suprapubic pain/pressure: no Flank pain: no Fever:  no Vomiting: no Status: better Treatments attempted: Pumpkin seed supplement  and Vesicare  Relevant past medical, surgical, family and social history reviewed and updated as indicated. Interim medical history since our last visit reviewed. Allergies and medications reviewed and updated.  Review of Systems  Constitutional:  Negative for activity change, appetite change, diaphoresis, fatigue and fever.  Respiratory:  Negative for cough, chest tightness and shortness of breath.   Cardiovascular:  Negative for chest pain, palpitations and leg swelling.  Gastrointestinal: Negative.   Genitourinary:  Positive for urgency. Negative for decreased urine volume, difficulty urinating, dysuria, frequency, hematuria and pelvic pain.  Neurological: Negative.   Psychiatric/Behavioral: Negative.       Per HPI unless specifically indicated above     Objective:    BP 130/79 (BP Location: Left Arm, Patient Position: Sitting, Cuff Size: Normal)   Pulse 94   Temp 98.2 F (36.8 C) (Oral)   Ht 5' (1.524 m)   Wt 151 lb (68.5 kg)   LMP  (LMP Unknown)   SpO2 96%   BMI 29.49 kg/m   Wt Readings from Last 3 Encounters:  01/17/23 151 lb (68.5 kg)  12/20/22 152 lb 3.2 oz (69 kg)  10/15/22 153 lb (69.4 kg)    Physical Exam Vitals and nursing note reviewed.  Constitutional:      General: She is awake. She is not in acute distress.    Appearance: She is well-developed and well-groomed. She is not ill-appearing or toxic-appearing.  HENT:     Head: Normocephalic.     Right Ear: Hearing and external ear normal.     Left Ear: Hearing and external ear normal.  Eyes:     General: Lids are normal.        Right eye: No discharge.        Left eye: No discharge.     Conjunctiva/sclera: Conjunctivae normal.     Pupils: Pupils are equal, round, and reactive to light.  Neck:     Thyroid: No thyromegaly.     Vascular: No carotid bruit.  Cardiovascular:     Rate and Rhythm: Normal rate and regular rhythm.     Heart sounds: Normal heart sounds. No murmur heard.    No gallop.  Pulmonary:     Effort: Pulmonary effort is normal. No accessory muscle usage or respiratory distress.     Breath sounds: Normal breath sounds.  Abdominal:     General: Bowel sounds are normal. There is no distension.     Palpations: Abdomen is soft.     Tenderness: There is no abdominal tenderness.  Musculoskeletal:     Cervical back: Normal range of motion and neck supple.     Right lower leg: No edema.     Left lower leg: No edema.  Lymphadenopathy:     Cervical: No cervical adenopathy.  Skin:    General: Skin is warm and dry.  Neurological:     Mental Status: She is alert and oriented to person, place, and time.     Deep Tendon Reflexes: Reflexes are normal and symmetric.     Reflex Scores:       Brachioradialis reflexes are 2+ on the right side and 2+ on the left side.      Patellar reflexes are 2+ on the right side and 2+ on the left side. Psychiatric:        Attention and Perception: Attention normal.        Mood and Affect: Mood normal.        Speech: Speech normal.        Behavior: Behavior normal. Behavior is cooperative.        Thought Content: Thought content normal.     Results for orders placed or performed in visit on 12/25/22  HM DIABETES EYE EXAM  Result Value Ref Range   HM Diabetic Eye Exam No Retinopathy No Retinopathy      Assessment & Plan:   Problem List Items Addressed This Visit       Genitourinary   OAB (overactive bladder) - Primary    Ongoing for several months,currently improving with Vesicare.  Educated her on OAB and stress incontinence.  Will increase Vesicare to 10 MG daily (current CrCl 116), educated her on this and recommend she immediately alert provider if any anticholinergic effects present.  Would like to use Myrbetriq, but appears to be higher cost for her.  Discussed benefit of pelvic floor therapy, she is scheduled in January for this.  If ongoing in future, consider urology referral.  Lower Vesicare to 5 MG if CrCl <30.  Check UA today.      Relevant Orders   Urinalysis, Routine w reflex microscopic     Follow up plan: Return for as scheduled January 14th.

## 2023-01-17 NOTE — Addendum Note (Signed)
Addended by: Aura Dials T on: 01/17/2023 05:18 PM   Modules accepted: Orders

## 2023-01-17 NOTE — Assessment & Plan Note (Addendum)
Ongoing for several months,currently improving with Vesicare.  Educated her on OAB and stress incontinence.  Will increase Vesicare to 10 MG daily (current CrCl 116), educated her on this and recommend she immediately alert provider if any anticholinergic effects present.  Would like to use Myrbetriq, but appears to be higher cost for her.  Discussed benefit of pelvic floor therapy, she is scheduled in January for this.  If ongoing in future, consider urology referral.  Lower Vesicare to 5 MG if CrCl <30.  Check UA today.

## 2023-01-22 LAB — URINE CULTURE

## 2023-01-22 MED ORDER — AMOXICILLIN-POT CLAVULANATE 875-125 MG PO TABS: 1.0000 | ORAL_TABLET | Freq: Two times a day (BID) | ORAL | 0 refills | Status: AC

## 2023-01-22 NOTE — Addendum Note (Signed)
Addended by: Aura Dials T on: 01/22/2023 11:39 AM   Modules accepted: Orders

## 2023-01-22 NOTE — Progress Notes (Signed)
Please let Kara Leach know her urine returned and she does have an infection present.  I have sent in Augmentin to start taking for this and we will recheck next visit, sooner if ongoing symptoms present.  Any questions?

## 2023-01-25 ENCOUNTER — Other Ambulatory Visit: Payer: Self-pay | Admitting: *Deleted

## 2023-01-25 DIAGNOSIS — D472 Monoclonal gammopathy: Secondary | ICD-10-CM

## 2023-01-28 ENCOUNTER — Encounter: Payer: Self-pay | Admitting: Oncology

## 2023-01-28 ENCOUNTER — Inpatient Hospital Stay (HOSPITAL_BASED_OUTPATIENT_CLINIC_OR_DEPARTMENT_OTHER): Payer: Medicare HMO | Admitting: Oncology

## 2023-01-28 ENCOUNTER — Inpatient Hospital Stay: Payer: Medicare HMO | Attending: Oncology

## 2023-01-28 VITALS — BP 108/60 | HR 82 | Resp 18 | Ht 60.0 in | Wt 150.0 lb

## 2023-01-28 DIAGNOSIS — Z803 Family history of malignant neoplasm of breast: Secondary | ICD-10-CM | POA: Diagnosis not present

## 2023-01-28 DIAGNOSIS — E1122 Type 2 diabetes mellitus with diabetic chronic kidney disease: Secondary | ICD-10-CM | POA: Diagnosis not present

## 2023-01-28 DIAGNOSIS — Z800 Family history of malignant neoplasm of digestive organs: Secondary | ICD-10-CM | POA: Insufficient documentation

## 2023-01-28 DIAGNOSIS — D72829 Elevated white blood cell count, unspecified: Secondary | ICD-10-CM | POA: Insufficient documentation

## 2023-01-28 DIAGNOSIS — N183 Chronic kidney disease, stage 3 unspecified: Secondary | ICD-10-CM

## 2023-01-28 DIAGNOSIS — N189 Chronic kidney disease, unspecified: Secondary | ICD-10-CM | POA: Diagnosis not present

## 2023-01-28 DIAGNOSIS — I129 Hypertensive chronic kidney disease with stage 1 through stage 4 chronic kidney disease, or unspecified chronic kidney disease: Secondary | ICD-10-CM | POA: Diagnosis not present

## 2023-01-28 DIAGNOSIS — D472 Monoclonal gammopathy: Secondary | ICD-10-CM | POA: Diagnosis not present

## 2023-01-28 LAB — CBC WITH DIFFERENTIAL/PLATELET
Abs Immature Granulocytes: 0.05 10*3/uL (ref 0.00–0.07)
Basophils Absolute: 0.1 10*3/uL (ref 0.0–0.1)
Basophils Relative: 1 %
Eosinophils Absolute: 0.3 10*3/uL (ref 0.0–0.5)
Eosinophils Relative: 2 %
HCT: 42.2 % (ref 36.0–46.0)
Hemoglobin: 13.8 g/dL (ref 12.0–15.0)
Immature Granulocytes: 1 %
Lymphocytes Relative: 39 %
Lymphs Abs: 4.2 10*3/uL — ABNORMAL HIGH (ref 0.7–4.0)
MCH: 30.1 pg (ref 26.0–34.0)
MCHC: 32.7 g/dL (ref 30.0–36.0)
MCV: 92.1 fL (ref 80.0–100.0)
Monocytes Absolute: 0.7 10*3/uL (ref 0.1–1.0)
Monocytes Relative: 7 %
Neutro Abs: 5.4 10*3/uL (ref 1.7–7.7)
Neutrophils Relative %: 50 %
Platelets: 278 10*3/uL (ref 150–400)
RBC: 4.58 MIL/uL (ref 3.87–5.11)
RDW: 13.9 % (ref 11.5–15.5)
WBC: 10.7 10*3/uL — ABNORMAL HIGH (ref 4.0–10.5)
nRBC: 0 % (ref 0.0–0.2)

## 2023-01-28 LAB — BASIC METABOLIC PANEL
Anion gap: 8 (ref 5–15)
BUN: 18 mg/dL (ref 8–23)
CO2: 26 mmol/L (ref 22–32)
Calcium: 9.4 mg/dL (ref 8.9–10.3)
Chloride: 104 mmol/L (ref 98–111)
Creatinine, Ser: 1.19 mg/dL — ABNORMAL HIGH (ref 0.44–1.00)
GFR, Estimated: 49 mL/min — ABNORMAL LOW (ref >60)
Glucose, Bld: 90 mg/dL (ref 70–99)
Potassium: 4.2 mmol/L (ref 3.5–5.1)
Sodium: 138 mmol/L (ref 135–145)

## 2023-01-29 LAB — IGG, IGA, IGM
IgA: 70 mg/dL — ABNORMAL LOW (ref 87–352)
IgG (Immunoglobin G), Serum: 1487 mg/dL (ref 586–1602)
IgM (Immunoglobulin M), Srm: 118 mg/dL (ref 26–217)

## 2023-01-29 LAB — KAPPA/LAMBDA LIGHT CHAINS
Kappa free light chain: 47.6 mg/L — ABNORMAL HIGH (ref 3.3–19.4)
Kappa, lambda light chain ratio: 2.08 — ABNORMAL HIGH (ref 0.26–1.65)
Lambda free light chains: 22.9 mg/L (ref 5.7–26.3)

## 2023-01-29 NOTE — Progress Notes (Signed)
[facility]  [phone] [phone]  ID: CARMYN SPOONAMORE OB: [birth year]  MR#: [id number]  AOZ#:[id number]  Patient Care Team: Marjie Skiff, NP as PCP - General (Nurse Practitioner) Mady Haagensen, MD (Internal Medicine) Jeralyn Ruths, MD as Consulting Physician (Oncology)  CHIEF COMPLAINT: MGUS  INTERVAL HISTORY: Patient returns to clinic today for routine yearly evaluation and discussion of her laboratory results.  She has intentional weight loss over the past year.  She currently feels well and is asymptomatic.  She has no neurologic complaints.  She denies any recent fevers or illnesses.  She has a good appetite and denies weight loss.  She denies any pain.  She has no chest pain, shortness of breath, cough, or hemoptysis.  She denies any nausea, vomiting, constipation, or diarrhea.  She has no urinary complaints.  Patient offers no specific complaints today.  REVIEW OF SYSTEMS:   Review of Systems  Constitutional: Negative.  Negative for fever, malaise/fatigue and weight loss.  Respiratory: Negative.  Negative for shortness of breath.   Cardiovascular: Negative.  Negative for chest pain and leg swelling.  Gastrointestinal: Negative.  Negative for abdominal pain.  Genitourinary: Negative.  Negative for dysuria.  Musculoskeletal: Negative.  Negative for back pain.  Skin: Negative.  Negative for rash.  Neurological: Negative.  Negative for sensory change, focal weakness, weakness and headaches.  Psychiatric/Behavioral: Negative.  The patient is not nervous/anxious.     As per HPI. Otherwise, a complete review of systems is negative.  PAST MEDICAL HISTORY: Past Medical History:  Diagnosis Date   Allergy    Anxiety    Arthritis    Osteoarthritis   Cancer (HCC) 05/2018   possibly on left breast but not sure   Chronic kidney disease    Depression    Diabetes mellitus without complication (HCC) 2012   Diabetic neuropathy (HCC)     feet   Diverticulitis 2017   GERD (gastroesophageal reflux disease)    Headache    sinus and migraines on occasion   Hyperlipidemia    Hypertension    Last menstrual period (LMP) > 10 days ago 1986   Obesity    Peripheral vascular disease (HCC)    neuropathy in hands and feet d/t diabetes   Renal insufficiency    Stage 2 kidney disease   Seizures (HCC)    due to high blood sugars. last time = 09/2014   Sleep apnea    has CPAP, can't tolerate   Stroke (HCC) 1980   minor. left side remains weaker than right   Wears hearing aid    right side   Wrist weakness    left - after fracture and repair    PAST SURGICAL HISTORY: Past Surgical History:  Procedure Laterality Date   ABDOMINAL HYSTERECTOMY  1986   APPENDECTOMY  1964   BREAST BIOPSY Left 07/03/2016   benign, fibrocystic changes with calcifications, usual ductal hyperplasia.    BREAST BIOPSY Left 04/23/2018   radial scar   BREAST BIOPSY Left 08/13/2018   Procedure: WIDE LOCALIZATION AND OPEN BIOPSY OF LEFT BREAST, DIABETIC;  Surgeon: Earline Mayotte, MD;  Location: [facility] ORS;  Service: General;  Laterality: Left;   BREAST EXCISIONAL BIOPSY Left 08/13/2018   CHOLECYSTECTOMY  2012   COLONOSCOPY WITH PROPOFOL N/A 12/01/2015   Procedure: COLONOSCOPY WITH PROPOFOL;  Surgeon: Midge Minium, MD;  Location: [facility];  Service: Endoscopy;  Laterality: N/A;  Diabetic - insulin Sleep apnea   PATELLA FRACTURE SURGERY Left  1. had to recenter kneecap, 2 and 3 surgeries were arthroscopies   WRIST FRACTURE SURGERY Left 2005   Steel plate    FAMILY HISTORY Family History  Problem Relation Age of Onset   Diabetes Mother    Heart disease Mother    Hypertension Mother    Cancer Father        stomach   Diabetes Sister    Arthritis Sister    Stroke Sister    Hypertension Sister    Breast cancer Maternal Aunt 75       great aunt   Parkinson's disease Maternal Grandmother    Tremor Maternal Grandmother         ADVANCED DIRECTIVES:    HEALTH MAINTENANCE: Social History   Tobacco Use   Smoking status: Never   Smokeless tobacco: Never  Vaping Use   Vaping status: Never Used  Substance Use Topics   Alcohol use: Yes    Comment: occasionally holiday, weddings   Drug use: No     Colonoscopy:  PAP:  Bone density:  Lipid panel:  Allergies  Allergen Reactions   Strawberry Extract Swelling    Lips swell and rash/whelps over body Difficulty breathing   Ibuprofen Other (See Comments)    "ringing in ears", dizziness Also, Advil / Aleve / Motrin   Cat Hair Extract Other (See Comments)    Sneezing    Current Outpatient Medications  Medication Sig Dispense Refill   acetaminophen (TYLENOL) 650 MG CR tablet Take 650 mg by mouth every 8 (eight) hours as needed for pain.     allopurinol (ZYLOPRIM) 100 MG tablet Take 1 tablet (100 mg total) by mouth daily. 90 tablet 3   aspirin EC 81 MG tablet Take 81 mg by mouth daily.     Calcium Carb-Cholecalciferol (CALCIUM 600 + D PO) Take 1 tablet by mouth 2 (two) times daily. 1000 IU VITAMIN D     dapagliflozin propanediol (FARXIGA) 10 MG TABS tablet Take 10 mg by mouth daily.     enalapril (VASOTEC) 2.5 MG tablet Take 1 tablet (2.5 mg total) by mouth daily. 90 tablet 4   fluticasone (FLONASE) 50 MCG/ACT nasal spray PLACE 2 SPRAYS INTO BOTH NOSTRILS DAILY AS NEEDED FOR ALLERGIES. 48 mL 3   insulin aspart (NOVOLOG FLEXPEN) 100 UNIT/ML FlexPen INJECT 12-14 UNITS UNDER THE SKIN 3 TIMES DAILY WITH MEALS 15 mL 0   Insulin Degludec (TRESIBA FLEXTOUCH [place]) Inject 47 Units into the skin at bedtime.     Loratadine (CLARITIN PO) Take 10 mg by mouth as needed.     MELATONIN PO Take by mouth as needed.     metoprolol succinate (TOPROL-XL) 25 MG 24 hr tablet Take 1 tablet (25 mg total) by mouth daily. 90 tablet 4   Semaglutide, 1 MG/DOSE, 4 MG/3ML SOPN Inject 1 mg as directed once a week. 3 mL 12   simvastatin (ZOCOR) 40 MG tablet TAKE 1 TABLET BY MOUTH  EVERYDAY AT BEDTIME 90 tablet 4   solifenacin (VESICARE) 10 MG tablet Take 1 tablet (10 mg total) by mouth daily. 90 tablet 4   Specialty Vitamins Products (VITA-RX DIABETIC VITAMIN) CAPS Take 1 packet by mouth daily. Diabetic Vitamins - 6 or 7 in 1 pack     BD PEN NEEDLE NANO U/F 32G X 4 MM MISC USE EVERY MORNING (Patient not taking: Reported on 01/28/2023) 100 each 12   Blood Glucose Monitoring Suppl (ONETOUCH VERIO REFLECT) w/Device KIT USE TO CHECK BLOOD SUGAR DAILY (Patient not taking:  Reported on 01/28/2023)     glucose blood (ONETOUCH ULTRA) test strip USE TO CHECK BLOOD SUGAR TWICE DAILY (Patient not taking: Reported on 01/28/2023) 100 strip 8   Insulin Pen Needle (B-D ULTRAFINE III SHORT PEN) 31G X 8 MM MISC 1 each by Does not apply route 3 (three) times daily. (Patient not taking: Reported on 01/28/2023) 100 each 12   Lancets (ONETOUCH DELICA PLUS LANCET30G) MISC Use to check blood sugars 3 to 4 times a day. (Patient not taking: Reported on 01/28/2023) 200 each 4   nystatin (MYCOSTATIN/NYSTOP) powder Apply 1 Application topically 3 (three) times daily. (Patient not taking: Reported on 01/28/2023) 15 g 4   sertraline (ZOLOFT) 100 MG tablet Take 1 tablet (100 mg total) by mouth every morning. 90 tablet 4   No current facility-administered medications for this visit.    OBJECTIVE: Vitals:   01/28/23 1045  BP: 108/60  Pulse: 82  Resp: 18  SpO2: 97%     Body mass index is 29.29 kg/m.    ECOG FS:0 - Asymptomatic  General: Well-developed, well-nourished, no acute distress. Eyes: Pink conjunctiva, anicteric sclera. HEENT: Normocephalic, moist mucous membranes. Lungs: No audible wheezing or coughing. Heart: Regular rate and rhythm. Abdomen: Soft, nontender, no obvious distention. Musculoskeletal: No edema, cyanosis, or clubbing. Neuro: Alert, answering all questions appropriately. Cranial nerves grossly intact. Skin: No rashes or petechiae noted. Psych: Normal affect.   LAB  RESULTS:  Lab Results  Component Value Date   NA 138 01/28/2023   K 4.2 01/28/2023   CL 104 01/28/2023   CO2 26 01/28/2023   GLUCOSE 90 01/28/2023   BUN 18 01/28/2023   CREATININE 1.19 (H) 01/28/2023   CALCIUM 9.4 01/28/2023   PROT 7.1 06/13/2022   ALBUMIN 4.2 06/13/2022   AST 60 (H) 06/13/2022   ALT 25 06/13/2022   ALKPHOS 109 06/13/2022   BILITOT <0.2 06/13/2022   GFRNONAA 49 (L) 01/28/2023   GFRAA 49 (L) 02/25/2020    Lab Results  Component Value Date   WBC 10.7 (H) 01/28/2023   NEUTROABS 5.4 01/28/2023   HGB 13.8 01/28/2023   HCT 42.2 01/28/2023   MCV 92.1 01/28/2023   PLT 278 01/28/2023   Lab Results  Component Value Date   TOTALPROTELP 7.2 01/18/2022   ALBUMINELP 4.0 01/18/2022   A1GS 0.2 01/18/2022   A2GS 0.8 01/18/2022   BETS 0.8 01/18/2022   GAMS 1.3 01/18/2022   MSPIKE 0.5 (H) 01/18/2022   SPEI Comment 01/18/2022     STUDIES: No results found.  ASSESSMENT: MGUS  PLAN:    MGUS: Chronic and unchanged.  Patient's M spike has ranged between 0.5 and 0.8 since March 2015.  Kappa free light chains have ranged between 35.5 and 58.7 over the same timeframe.  Immunoglobulins continue to be within normal limits.  Today's results are pending at time of dictation.  No intervention is needed at this time.  Patient does not require bone marrow biopsy or imaging.  Return to clinic in 1 year with repeat laboratory work and further evaluation.  If her laboratory work remains stable, this will be 10 years with no progression of laboratory work and patient can be discharged from clinic.   Chronic renal insufficiency: Chronic and unchanged.  Patient's most recent creatinine is 1.19.  Continue follow-up with nephrology as scheduled.   Hyperglycemia: Resolved.  Patient has improved blood glucose control.  Leukocytosis: Likely reactive.   Patient expressed understanding and was in agreement with this plan. She also understands that  She can call clinic at any time with any  questions, concerns, or complaints.   Jeralyn Ruths, MD   01/29/2023 7:03 AM

## 2023-01-30 LAB — PROTEIN ELECTROPHORESIS, SERUM
A/G Ratio: 1.1 (ref 0.7–1.7)
Albumin ELP: 3.9 g/dL (ref 2.9–4.4)
Alpha-1-Globulin: 0.3 g/dL (ref 0.0–0.4)
Alpha-2-Globulin: 0.9 g/dL (ref 0.4–1.0)
Beta Globulin: 0.9 g/dL (ref 0.7–1.3)
Gamma Globulin: 1.4 g/dL (ref 0.4–1.8)
Globulin, Total: 3.5 g/dL (ref 2.2–3.9)
M-Spike, %: 0.8 g/dL — ABNORMAL HIGH
Total Protein ELP: 7.4 g/dL (ref 6.0–8.5)

## 2023-03-26 ENCOUNTER — Other Ambulatory Visit: Payer: Self-pay | Admitting: Nurse Practitioner

## 2023-03-28 NOTE — Telephone Encounter (Signed)
 Kara Leach refused because it was discontinued on 05/12/2022.

## 2023-03-30 NOTE — Patient Instructions (Addendum)
 Scheduled bone density with you mammogram.  Decrease Tresiba  to 30 units daily Stop morning and lunch meal time insulin , maintain dinner for now but reduce to 12.  Be Involved in Caring For Your Health:  Taking Medications When medications are taken as directed, they can greatly improve your health. But if they are not taken as prescribed, they may not work. In some cases, not taking them correctly can be harmful. To help ensure your treatment remains effective and safe, understand your medications and how to take them. Bring your medications to each visit for review by your provider.  Your lab results, notes, and after visit summary will be available on My Chart. We strongly encourage you to use this feature. If lab results are abnormal the clinic will contact you with the appropriate steps. If the clinic does not contact you assume the results are satisfactory. You can always view your results on My Chart. If you have questions regarding your health or results, please contact the clinic during office hours. You can also ask questions on My Chart.  We at [facility] are grateful that you chose us  to provide your care. We strive to provide evidence-based and compassionate care and are always looking for feedback. If you get a survey from the clinic please complete this so we can hear your opinions.  Diabetes Mellitus and Foot Care Diabetes, also called diabetes mellitus, may cause problems with your feet and legs because of poor blood flow (circulation). Poor circulation may make your skin: Become thinner and drier. Break more easily. Heal more slowly. Peel and crack. You may also have nerve damage (neuropathy). This can cause decreased feeling in your legs and feet. This means that you may not notice minor injuries to your feet that could lead to more serious problems. Finding and treating problems early is the best way to prevent future foot problems. How to care for your  feet Foot hygiene  Wash your feet daily with warm water  and mild soap. Do not use hot water . Then, pat your feet and the areas between your toes until they are fully dry. Do not soak your feet. This can dry your skin. Trim your toenails straight across. Do not dig under them or around the cuticle. File the edges of your nails with an emery board or nail file. Apply a moisturizing lotion or petroleum jelly to the skin on your feet and to dry, brittle toenails. Use lotion that does not contain alcohol and is unscented. Do not apply lotion between your toes. Shoes and socks Wear clean socks or stockings every day. Make sure they are not too tight. Do not wear knee-high stockings. These may decrease blood flow to your legs. Wear shoes that fit well and have enough cushioning. Always look in your shoes before you put them on to be sure there are no objects inside. To break in new shoes, wear them for just a few hours a day. This prevents injuries on your feet. Wounds, scrapes, corns, and calluses  Check your feet daily for blisters, cuts, bruises, sores, and redness. If you cannot see the bottom of your feet, use a mirror or ask someone for help. Do not cut off corns or calluses or try to remove them with medicine. If you find a minor scrape, cut, or break in the skin on your feet, keep it and the skin around it clean and dry. You may clean these areas with mild soap and water . Do not clean the  area with peroxide, alcohol, or iodine. If you have a wound, scrape, corn, or callus on your foot, look at it several times a day to make sure it is healing and not infected. Check for: Redness, swelling, or pain. Fluid or blood. Warmth. Pus or a bad smell. General tips Do not cross your legs. This may decrease blood flow to your feet. Do not use heating pads or hot water  bottles on your feet. They may burn your skin. If you have lost feeling in your feet or legs, you may not know this is happening until it  is too late. Protect your feet from hot and cold by wearing shoes, such as at the beach or on hot pavement. Schedule a complete foot exam at least once a year or more often if you have foot problems. Report any cuts, sores, or bruises to your health care provider right away. Where to find more information American Diabetes Association: diabetes.org Association of Diabetes [employer] Education Specialists: diabeteseducator.org Contact a health care provider if: You have a condition that increases your risk of infection, and you have any cuts, sores, or bruises on your feet. You have an injury that is not healing. You have redness on your legs or feet. You feel burning or tingling in your legs or feet. You have pain or cramps in your legs and feet. Your legs or feet are numb. Your feet always feel cold. You have pain around any toenails. Get help right away if: You have a wound, scrape, corn, or callus on your foot and: You have signs of infection. You have a fever. You have a red line going up your leg. This information is not intended to replace advice given to you by your health care provider. Make sure you discuss any questions you have with your health care provider. Document Revised: 09/06/2021 Document Reviewed: 09/06/2021 Elsevier Patient Education  2024 [employer].

## 2023-04-02 ENCOUNTER — Encounter: Payer: Self-pay | Admitting: Nurse Practitioner

## 2023-04-02 ENCOUNTER — Ambulatory Visit (INDEPENDENT_AMBULATORY_CARE_PROVIDER_SITE_OTHER): Payer: Medicare HMO | Admitting: Nurse Practitioner

## 2023-04-02 VITALS — BP 124/76 | HR 88 | Temp 97.4°F | Ht 60.0 in | Wt 147.6 lb

## 2023-04-02 DIAGNOSIS — Z794 Long term (current) use of insulin: Secondary | ICD-10-CM

## 2023-04-02 DIAGNOSIS — M8588 Other specified disorders of bone density and structure, other site: Secondary | ICD-10-CM | POA: Diagnosis not present

## 2023-04-02 DIAGNOSIS — E1169 Type 2 diabetes mellitus with other specified complication: Secondary | ICD-10-CM

## 2023-04-02 DIAGNOSIS — M1A39X0 Chronic gout due to renal impairment, multiple sites, without tophus (tophi): Secondary | ICD-10-CM | POA: Diagnosis not present

## 2023-04-02 DIAGNOSIS — E119 Type 2 diabetes mellitus without complications: Secondary | ICD-10-CM | POA: Diagnosis not present

## 2023-04-02 DIAGNOSIS — E1122 Type 2 diabetes mellitus with diabetic chronic kidney disease: Secondary | ICD-10-CM | POA: Diagnosis not present

## 2023-04-02 DIAGNOSIS — Z0000 Encounter for general adult medical examination without abnormal findings: Secondary | ICD-10-CM | POA: Diagnosis not present

## 2023-04-02 DIAGNOSIS — E559 Vitamin D deficiency, unspecified: Secondary | ICD-10-CM

## 2023-04-02 DIAGNOSIS — E538 Deficiency of other specified B group vitamins: Secondary | ICD-10-CM | POA: Diagnosis not present

## 2023-04-02 DIAGNOSIS — D472 Monoclonal gammopathy: Secondary | ICD-10-CM | POA: Diagnosis not present

## 2023-04-02 DIAGNOSIS — E1159 Type 2 diabetes mellitus with other circulatory complications: Secondary | ICD-10-CM

## 2023-04-02 DIAGNOSIS — E66811 Obesity, class 1: Secondary | ICD-10-CM

## 2023-04-02 DIAGNOSIS — K219 Gastro-esophageal reflux disease without esophagitis: Secondary | ICD-10-CM

## 2023-04-02 DIAGNOSIS — F324 Major depressive disorder, single episode, in partial remission: Secondary | ICD-10-CM

## 2023-04-02 DIAGNOSIS — N1832 Chronic kidney disease, stage 3b: Secondary | ICD-10-CM

## 2023-04-02 DIAGNOSIS — I152 Hypertension secondary to endocrine disorders: Secondary | ICD-10-CM | POA: Diagnosis not present

## 2023-04-02 DIAGNOSIS — E785 Hyperlipidemia, unspecified: Secondary | ICD-10-CM | POA: Diagnosis not present

## 2023-04-02 LAB — MICROALBUMIN, URINE WAIVED
Creatinine, Urine Waived: 100 mg/dL (ref 10–300)
Microalb, Ur Waived: 30 mg/L — ABNORMAL HIGH (ref 0–19)
Microalb/Creat Ratio: <30 mg/g (ref <30)

## 2023-04-02 LAB — BAYER DCA HB A1C WAIVED: HB A1C (BAYER DCA - WAIVED): 5.3 % (ref 4.8–5.6)

## 2023-04-02 MED ORDER — TRESIBA FLEXTOUCH 100 UNIT/ML ~~LOC~~ SOPN: 30.0000 [IU] | PEN_INJECTOR | Freq: Every day | SUBCUTANEOUS | 1 refills | Status: DC

## 2023-04-02 MED ORDER — ALLOPURINOL 100 MG PO TABS: 100.0000 mg | ORAL_TABLET | Freq: Every day | ORAL | 4 refills | Status: AC

## 2023-04-02 MED ORDER — ENALAPRIL MALEATE 2.5 MG PO TABS: 2.5000 mg | ORAL_TABLET | Freq: Every day | ORAL | 4 refills | Status: AC

## 2023-04-02 MED ORDER — METOPROLOL SUCCINATE ER 25 MG PO TB24: 25.0000 mg | ORAL_TABLET | Freq: Every day | ORAL | 4 refills | Status: AC

## 2023-04-02 MED ORDER — SIMVASTATIN 40 MG PO TABS: ORAL_TABLET | ORAL | 4 refills | Status: AC

## 2023-04-02 MED ORDER — SERTRALINE HCL 100 MG PO TABS: 100.0000 mg | ORAL_TABLET | Freq: Every morning | ORAL | 4 refills | Status: AC

## 2023-04-02 MED ORDER — SEMAGLUTIDE (1 MG/DOSE) 4 MG/3ML ~~LOC~~ SOPN: 1.0000 mg | PEN_INJECTOR | SUBCUTANEOUS | 12 refills | Status: DC

## 2023-04-02 NOTE — Assessment & Plan Note (Signed)
Chronic, stable.  Continue current medication regimen, Famotidine, and check Mag level today.   

## 2023-04-02 NOTE — Assessment & Plan Note (Signed)
Chronic with osteopenia.  Check Vitamin D level and adjust as needed.

## 2023-04-02 NOTE — Assessment & Plan Note (Signed)
 BMI 28.83, is losing weight with Ozempic  on board.  Recommended eating smaller high protein, low fat meals more frequently and exercising 30 mins a day 5 times a week with a goal of 10-15lb weight loss in the next 3 months. Patient voiced their understanding and motivation to adhere to these recommendations.

## 2023-04-02 NOTE — Assessment & Plan Note (Signed)
Ongoing, stable.  Continue Vitamin D and calcium daily.  Check Vit D level today.  Repeat DEXA in July 2025.   

## 2023-04-02 NOTE — Progress Notes (Addendum)
 BP 124/76   Pulse 88   Temp (!) 97.4 F (36.3 C) (Oral)   Ht 5' (1.524 m)   Wt 147 lb 9.6 oz (67 kg)   LMP  (LMP Unknown)   SpO2 95%   BMI 28.83 kg/m    Subjective:    Patient ID: Kara Leach, female    DOB: 05/11/1950, 73 y.o.   MRN: [id number]  HPI: Kara Leach is a 73 y.o. female presenting on 04/02/2023 for comprehensive medical examination. Current medical complaints include:none  She currently lives with: self Menopausal Symptoms: no  DIABETES A1c 5.7% in October.  Continues on Novolog  pre meal and Tresiba , Ozempic , Doreen (works with PharmD on assistance with this).   Hypoglycemic episodes: occasional Polydipsia/polyuria: no Visual disturbance: no Chest pain: no Paresthesias: no Glucose Monitoring: yes             Accucheck frequency: BID             Fasting glucose: 80 to 100             Post prandial:             Evening: 80 to 100 range             Before meals: Taking Insulin ?: yes             Long acting insulin : Tresiba  35 units             Short acting insulin : Novolog  12 units morning/afternoon & 14 at dinner Blood Pressure Monitoring: daily Retinal Examination: Up To Date Foot Exam: Up to Date Pneumovax: Up to Date Influenza: Up to Date Aspirin : yes    HYPERTENSION / HYPERLIPIDEMIA Taking Enalapril , Metoprolol , ASA + Simvastatin . Continues to follow with vascular for varicose veins, last visit 10/15/22. Satisfied with current treatment? yes Duration of hypertension: chronic BP monitoring frequency: daily BP range: <130/80 on average on home checks BP medication side effects: no Duration of hyperlipidemia: chronic Cholesterol medication side effects: no Cholesterol supplements: none Medication compliance: good compliance Aspirin : yes Recent stressors: no Recurrent headaches: no Visual changes: no Palpitations: no Dyspnea: no Chest pain: no Lower extremity edema: no Dizzy/lightheaded: no The ASCVD Risk score (Arnett DK, et al.,  2019) failed to calculate for the following reasons:   Risk score cannot be calculated because patient has a medical history suggesting prior/existing ASCVD  CHRONIC KIDNEY DISEASE Last visit with nephrology was 12/06/22 and returns on the 21st of January.  Labs CRT 1.09 and eGFR 55. CKD status: stable Medications renally dose: yes Previous renal evaluation: no Pneumovax:  Up to Date Influenza Vaccine:  Up to Date   OSTEOPENIA: Taking daily calcium  and Vitamin D  supplements.  Noted on DEXA in July 2020.   Satisfied with current treatment?: yes Adequate calcium  & vitamin D : yes Weight bearing exercises: yes    MGUS: Follows with oncology with last visit 01/28/23.  To continue yearly visits and labs with them.  She followed with Dr. Dessa after her mammograms for history of biopsies, last saw 05/05/20 -- he has retired.   GOUT Taking Allopurinol  100 MG daily.  No flare in several years Duration:chronic Swelling: no Redness: no Trauma: no Recent dietary change or indiscretion: no Fevers: no Nausea/vomiting: no Status:  stable Treatments attempted: Allopurinol   TREMORS Had visit with neurology last 10/03/21.  Grandmother had a familial essential tremor. Tremors have been present for a long time (2012), at present are at baseline.  These become worse if sugar  gets too low or increased anxiety. Tremor in hands bilaterally at rest and with activity, makes it hard to eat sometimes. She feels off balance at times on feet, although reports never had had good balance.   DEPRESSION Continues on Sertraline  100 MG.  Winter months  are at times tougher. Mood status: stable Satisfied with current treatment?: yes Symptom severity: moderate  Duration of current treatment : chronic Side effects: no Medication compliance: good compliance Psychotherapy/counseling: none Previous psychiatric medications: Zoloft  Depressed mood: occasional Anxious mood: occasional Anhedonia: no Significant  weight loss or gain: no Insomnia: no Fatigue: no Feelings of worthlessness or guilt: no Impaired concentration/indecisiveness: yes Suicidal ideations: no Hopelessness: no Crying spells: no    04/02/2023   10:29 AM 01/17/2023   11:06 AM 12/20/2022   10:48 AM 10/09/2022   10:12 AM 09/19/2022   11:20 AM  Depression screen PHQ 2/9  Decreased Interest 1 1 1  0 1  Down, Depressed, Hopeless 1 1 1  0 1  PHQ - 2 Score 2 2 2  0 2  Altered sleeping 1 1 1  0 1  Tired, decreased energy 1 1 1  0 1  Change in appetite 0 0 1 0 1  Feeling bad or failure about yourself  0 1 0 0 1  Trouble concentrating 0 0 0 0 1  Moving slowly or fidgety/restless 0 0 0 0 0  Suicidal thoughts 0 0 0 0 0  PHQ-9 Score 4 5 5  0 7  Difficult doing work/chores Somewhat difficult  Somewhat difficult Not difficult at all Somewhat difficult      04/02/2023   10:29 AM 01/17/2023   11:06 AM 12/20/2022   10:49 AM 09/19/2022   11:21 AM  GAD 7 : Generalized Anxiety Score  Nervous, Anxious, on Edge 1 1 1 1   Control/stop worrying 1 0 1 1  Worry too much - different things 1 0 1 1  Trouble relaxing 1 1 0 1  Restless 0 0 0 0  Easily annoyed or irritable 0 0 0 0  Afraid - awful might happen 1 0 1 0  Total GAD 7 Score 5 2 4 4   Anxiety Difficulty Somewhat difficult  Somewhat difficult Somewhat difficult       06/13/2022    9:57 AM 09/19/2022   11:20 AM 10/09/2022   10:14 AM 12/20/2022   10:48 AM 04/02/2023   10:28 AM  Fall Risk  Falls in the past year? 0 1 1 0 1  Was there an injury with Fall? 0 1 1 0 1  Fall Risk Category Calculator 0 3 3 0 3  Patient at Risk for Falls Due to History of fall(s) History of fall(s) History of fall(s) No Fall Risks History of fall(s)  Fall risk Follow up Falls evaluation completed Falls evaluation completed Falls prevention discussed;Falls evaluation completed Falls evaluation completed Falls evaluation completed    Functional Status Survey: Is the patient deaf or have difficulty hearing?: No Does  the patient have difficulty seeing, even when wearing glasses/contacts?: No Does the patient have difficulty concentrating, remembering, or making decisions?: No Does the patient have difficulty walking or climbing stairs?: No Does the patient have difficulty dressing or bathing?: No Does the patient have difficulty doing errands alone such as visiting a doctor's office or shopping?: No   Past Medical History:  Past Medical History:  Diagnosis Date  . Allergy   . Anxiety   . Arthritis    Osteoarthritis  . Cancer (HCC) 05/2018   possibly on  left breast but not sure  . Chronic kidney disease   . Depression   . Diabetes mellitus without complication (HCC) 2012  . Diabetic neuropathy (HCC)    feet  . Diverticulitis 2017  . GERD (gastroesophageal reflux disease)   . Headache    sinus and migraines on occasion  . Hyperlipidemia   . Hypertension   . Last menstrual period (LMP) > 10 days ago 1986  . Obesity   . Peripheral vascular disease (HCC)    neuropathy in hands and feet d/t diabetes  . Renal insufficiency    Stage 2 kidney disease  . Seizures (HCC)    due to high blood sugars. last time = 09/2014  . Sleep apnea    has CPAP, can't tolerate  . Stroke (HCC) 1980   minor. left side remains weaker than right  . Wears hearing aid    right side  . Wrist weakness    left - after fracture and repair    Surgical History:  Past Surgical History:  Procedure Laterality Date  . ABDOMINAL HYSTERECTOMY  1986  . APPENDECTOMY  1964  . BREAST BIOPSY Left 07/03/2016   benign, fibrocystic changes with calcifications, usual ductal hyperplasia.   SABRA BREAST BIOPSY Left 04/23/2018   radial scar  . BREAST BIOPSY Left 08/13/2018   Procedure: WIDE LOCALIZATION AND OPEN BIOPSY OF LEFT BREAST, DIABETIC;  Surgeon: Dessa Reyes ORN, MD;  Location: [facility];  Service: General;  Laterality: Left;  . BREAST EXCISIONAL BIOPSY Left 08/13/2018  . CHOLECYSTECTOMY  2012  . COLONOSCOPY WITH PROPOFOL  N/A  12/01/2015   Procedure: COLONOSCOPY WITH PROPOFOL ;  Surgeon: Rogelia Copping, MD;  Location: [facility];  Service: Endoscopy;  Laterality: N/A;  Diabetic - insulin  Sleep apnea  . PATELLA FRACTURE SURGERY Left    1. had to recenter kneecap, 2 and 3 surgeries were arthroscopies  . WRIST FRACTURE SURGERY Left 2005   Steel plate    Medications:  Current Outpatient Medications on File Prior to Visit  Medication Sig  . acetaminophen  (TYLENOL ) 650 MG CR tablet Take 650 mg by mouth every 8 (eight) hours as needed for pain.  . aspirin  EC 81 MG tablet Take 81 mg by mouth daily.  . BD PEN NEEDLE NANO U/F 32G X 4 MM MISC USE EVERY MORNING  . Blood Glucose Monitoring Suppl (ONETOUCH VERIO REFLECT) w/Device KIT   . Calcium  Carb-Cholecalciferol (CALCIUM  600 + D PO) Take 1 tablet by mouth 2 (two) times daily. 1000 IU VITAMIN D   . dapagliflozin propanediol (FARXIGA) 10 MG TABS tablet Take 10 mg by mouth daily.  . fluticasone  (FLONASE ) 50 MCG/ACT nasal spray PLACE 2 SPRAYS INTO BOTH NOSTRILS DAILY AS NEEDED FOR ALLERGIES.  SABRA glucose blood (ONETOUCH ULTRA) test strip USE TO CHECK BLOOD SUGAR TWICE DAILY  . insulin  aspart (NOVOLOG  FLEXPEN) 100 UNIT/ML FlexPen INJECT 12-14 UNITS UNDER THE SKIN 3 TIMES DAILY WITH MEALS  . Insulin  Pen Needle (B-D ULTRAFINE III SHORT PEN) 31G X 8 MM MISC 1 each by Does not apply route 3 (three) times daily.  . Lancets (ONETOUCH DELICA PLUS LANCET30G) MISC Use to check blood sugars 3 to 4 times a day.  . Loratadine (CLARITIN PO) Take 10 mg by mouth as needed.  SABRA MELATONIN PO Take by mouth as needed.  . solifenacin  (VESICARE ) 10 MG tablet Take 1 tablet (10 mg total) by mouth daily.  SABRA Specialty Vitamins Products (VITA-RX DIABETIC VITAMIN) CAPS Take 1 packet by mouth daily. Diabetic Vitamins -  6 or 7 in 1 pack   No current facility-administered medications on file prior to visit.    Allergies:  Allergies  Allergen Reactions  . Strawberry Extract Swelling    Lips swell  and rash/whelps over body Difficulty breathing  . Ibuprofen Other (See Comments)    ringing in ears, dizziness Also, Advil / Aleve / Motrin  . Cat Hair Extract Other (See Comments)    Sneezing    Social History:  Social History   Socioeconomic History  . Marital status: Widowed    Spouse name: Not on file  . Number of children: Not on file  . Years of education: Not on file  . Highest education level: Not on file  Occupational History  . Occupation: worked at [employer] for 41 years    Comment: retired  Tobacco Use  . Smoking status: Never  . Smokeless tobacco: Never  Vaping Use  . Vaping status: Never Used  Substance and Sexual Activity  . Alcohol use: Yes    Comment: occasionally holiday, weddings  . Drug use: No  . Sexual activity: Never  Other Topics Concern  . Not on file  Social History Narrative  . Not on file   Social Drivers of Health   Financial Resource Strain: Low Risk  (10/09/2022)   Overall Financial Resource Strain (CARDIA)   . Difficulty of Paying Living Expenses: Not hard at all  Food Insecurity: No Food Insecurity (10/09/2022)   Hunger Vital Sign   . Worried About Programme Researcher, Broadcasting/film/video in the Last Year: Never true   . Ran Out of Food in the Last Year: Never true  Transportation Needs: No Transportation Needs (10/09/2022)   PRAPARE - Transportation   . Lack of Transportation (Medical): No   . Lack of Transportation (Non-Medical): No  Physical Activity: Insufficiently Active (10/09/2022)   Exercise Vital Sign   . Days of Exercise per Week: 3 days   . Minutes of Exercise per Session: 30 min  Stress: No Stress Concern Present (10/09/2022)   [employer] of Occupational Health - Occupational Stress Questionnaire   . Feeling of Stress : Not at all  Social Connections: Moderately Isolated (10/09/2022)   Social Connection and Isolation Panel [NHANES]   . Frequency of Communication with Friends and Family: More than three times a week   . Frequency of  Social Gatherings with Friends and Family: More than three times a week   . Attends Religious Services: More than 4 times per year   . Active Member of Clubs or Organizations: No   . Attends Banker Meetings: Never   . Marital Status: Widowed  Intimate Partner Violence: Not At Risk (10/09/2022)   Humiliation, Afraid, Rape, and Kick questionnaire   . Fear of Current or Ex-Partner: No   . Emotionally Abused: No   . Physically Abused: No   . Sexually Abused: No   Social History   Tobacco Use  Smoking Status Never  Smokeless Tobacco Never   Social History   Substance and Sexual Activity  Alcohol Use Yes   Comment: occasionally holiday, weddings    Family History:  Family History  Problem Relation Age of Onset  . Diabetes Mother   . Heart disease Mother   . Hypertension Mother   . Cancer Father        stomach  . Diabetes Sister   . Arthritis Sister   . Stroke Sister   . Hypertension Sister   . Breast cancer Maternal Aunt  55       great aunt  . Parkinson's disease Maternal Grandmother   . Tremor Maternal Grandmother    Past medical history, surgical history, medications, allergies, family history and social history reviewed with patient today and changes made to appropriate areas of the chart.   ROS All other ROS negative except what is listed above and in the HPI.      Objective:    BP 124/76   Pulse 88   Temp (!) 97.4 F (36.3 C) (Oral)   Ht 5' (1.524 m)   Wt 147 lb 9.6 oz (67 kg)   LMP  (LMP Unknown)   SpO2 95%   BMI 28.83 kg/m   Wt Readings from Last 3 Encounters:  04/02/23 147 lb 9.6 oz (67 kg)  01/28/23 150 lb (68 kg)  01/17/23 151 lb (68.5 kg)    Physical Exam Vitals and nursing note reviewed. Exam conducted with a chaperone present.  Constitutional:      General: She is awake. She is not in acute distress.    Appearance: She is well-developed. She is not ill-appearing.  HENT:     Head: Normocephalic and atraumatic.     Right Ear:  Hearing, tympanic membrane, ear canal and external ear normal. No drainage.     Left Ear: Hearing, tympanic membrane, ear canal and external ear normal. No drainage.     Nose: Nose normal.     Right Sinus: No maxillary sinus tenderness or frontal sinus tenderness.     Left Sinus: No maxillary sinus tenderness or frontal sinus tenderness.     Mouth/Throat:     Mouth: Mucous membranes are moist.     Pharynx: Oropharynx is clear. Uvula midline. No pharyngeal swelling, oropharyngeal exudate or posterior oropharyngeal erythema.  Eyes:     General: Lids are normal.        Right eye: No discharge.        Left eye: No discharge.     Extraocular Movements: Extraocular movements intact.     Conjunctiva/sclera: Conjunctivae normal.     Pupils: Pupils are equal, round, and reactive to light.     Visual Fields: Right eye visual fields normal and left eye visual fields normal.  Neck:     Thyroid : No thyromegaly.     Vascular: No carotid bruit.     Trachea: Trachea normal.  Cardiovascular:     Rate and Rhythm: Normal rate and regular rhythm.     Heart sounds: Normal heart sounds. No murmur heard.    No gallop.  Pulmonary:     Effort: Pulmonary effort is normal. No accessory muscle usage or respiratory distress.     Breath sounds: Normal breath sounds.  Chest:     Comments: Deferred per request, has performed with [facility]. Abdominal:     General: Bowel sounds are normal.     Palpations: Abdomen is soft. There is no hepatomegaly or splenomegaly.     Tenderness: There is no abdominal tenderness.  Musculoskeletal:        General: Normal range of motion.     Cervical back: Normal range of motion and neck supple.     Right lower leg: No edema.     Left lower leg: No edema.  Lymphadenopathy:     Head:     Right side of head: No submental, submandibular, tonsillar, preauricular or posterior auricular adenopathy.     Left side of head: No submental, submandibular, tonsillar, preauricular or  posterior auricular adenopathy.  Cervical: No cervical adenopathy.  Skin:    General: Skin is warm and dry.     Capillary Refill: Capillary refill takes less than 2 seconds.     Findings: No rash.  Neurological:     Mental Status: She is alert and oriented to person, place, and time.     Cranial Nerves: Cranial nerves 2-12 are intact.     Motor: Tremor (bilateral upper extremity with rest.) present. No weakness.     Coordination: Coordination is intact.     Gait: Gait is intact.     Deep Tendon Reflexes: Reflexes are normal and symmetric.     Reflex Scores:      Brachioradialis reflexes are 2+ on the right side and 2+ on the left side.      Patellar reflexes are 2+ on the right side and 2+ on the left side.    Comments: No cogwheel noted and no gait changes.  Psychiatric:        Attention and Perception: Attention normal.        Mood and Affect: Mood normal.        Speech: Speech normal.        Behavior: Behavior normal. Behavior is cooperative.        Thought Content: Thought content normal.        Judgment: Judgment normal.   Diabetic Foot Exam - Simple   Simple Foot Form Visual Inspection No deformities, no ulcerations, no other skin breakdown bilaterally: Yes Sensation Testing Intact to touch and monofilament testing bilaterally: Yes Pulse Check Posterior Tibialis and Dorsalis pulse intact bilaterally: Yes Comments     Results for orders placed or performed in visit on 04/02/23  Bayer DCA Hb A1c Waived   Collection Time: 04/02/23 10:31 AM  Result Value Ref Range   HB A1C (BAYER DCA - WAIVED) 5.3 4.8 - 5.6 %  Microalbumin, Urine Waived   Collection Time: 04/02/23 10:31 AM  Result Value Ref Range   Microalb, Ur Waived 30 (H) 0 - 19 mg/L   Creatinine, Urine Waived 100 10 - 300 mg/dL   Microalb/Creat Ratio <30 <30 mg/g      Assessment & Plan:   Problem List Items Addressed This Visit       Cardiovascular and Mediastinum   Hypertension associated with diabetes  (HCC) (Chronic)   Chronic, stable.  BP remains at goal in office and at home for her age.  Continue current medication regimen and adjust as needed.  Alert provider if BP consistently >130/80 at home and then may restart Amlodipine  at 2.5 MG.  Continue Metoprolol  and Enalapril , adjust as needed.  Focus on DASH diet.  Enalapril  for kidney protection.  LABS: TSH and urine ALB -- suspect CBC and CMP will be performed with nephrology upcoming.      Relevant Medications   simvastatin  (ZOCOR ) 40 MG tablet   enalapril  (VASOTEC ) 2.5 MG tablet   insulin  degludec (TRESIBA  FLEXTOUCH) 100 UNIT/ML FlexTouch Pen   metoprolol  succinate (TOPROL -XL) 25 MG 24 hr tablet   Semaglutide , 1 MG/DOSE, 4 MG/3ML SOPN   Other Relevant Orders   Bayer DCA Hb A1c Waived (Completed)   Microalbumin, Urine Waived (Completed)   TSH     Digestive   Gastroesophageal reflux disease without esophagitis (Chronic)   Chronic, stable.  Continue current medication regimen, Famotidine, and check Mag level today.        Relevant Orders   Magnesium      Endocrine   Hyperlipidemia associated with type  2 diabetes mellitus (HCC) (Chronic)   Chronic, ongoing.  Continue current medication regimen and adjust as needed.  Lipid panel today.      Relevant Medications   simvastatin  (ZOCOR ) 40 MG tablet   enalapril  (VASOTEC ) 2.5 MG tablet   insulin  degludec (TRESIBA  FLEXTOUCH) 100 UNIT/ML FlexTouch Pen   metoprolol  succinate (TOPROL -XL) 25 MG 24 hr tablet   Semaglutide , 1 MG/DOSE, 4 MG/3ML SOPN   Other Relevant Orders   Bayer DCA Hb A1c Waived (Completed)   Lipid Panel w/o Chol/HDL Ratio   Type 2 diabetes mellitus with stage 3b chronic kidney disease, with long-term current use of insulin  (HCC) - Primary (Chronic)   Chronic, ongoing with A1c 5.3% today and urine ALB 27 May 2022 -- will recheck today. Recommend she check sugars 2 hours after a meal + fasting in morning -- bring to visits. Have recommended she stop morning and lunch  mealtime insulin  and reduce long acting to 30 units, want to avoid lows.  Educated her on insulin  and to reduce Tresiba  by 3 units if sugars consistently in 70-80 range.  Goal in long run would be to discontinue insulin  -- could increase Ozempic  to 2 MG next visit and further reduce insulin .  - Eye and foot exam up to date.   - Vaccines up to date.   - ACE and Statin on board.   - Return in 3 months.      Relevant Medications   simvastatin  (ZOCOR ) 40 MG tablet   enalapril  (VASOTEC ) 2.5 MG tablet   insulin  degludec (TRESIBA  FLEXTOUCH) 100 UNIT/ML FlexTouch Pen   Semaglutide , 1 MG/DOSE, 4 MG/3ML SOPN   Other Relevant Orders   Bayer DCA Hb A1c Waived (Completed)   Microalbumin, Urine Waived (Completed)   Diabetes mellitus treated with insulin  (HCC)   Refer to diabetes with CKD plan of care.      Relevant Medications   simvastatin  (ZOCOR ) 40 MG tablet   enalapril  (VASOTEC ) 2.5 MG tablet   insulin  degludec (TRESIBA  FLEXTOUCH) 100 UNIT/ML FlexTouch Pen   Semaglutide , 1 MG/DOSE, 4 MG/3ML SOPN     Musculoskeletal and Integument   Osteopenia of spine (Chronic)   Ongoing, stable.  Continue Vitamin D  and calcium  daily.  Check Vit D level today.  Repeat DEXA in July 2025.        Relevant Orders   DG Bone Density     Other   MGUS (monoclonal gammopathy of unknown significance) (Chronic)   Chronic, stable.  Continue collaboration with [facility] Jacobo at [facility] - recent notes and labs reviewed.      Obesity (Chronic)   BMI 28.83, is losing weight with Ozempic  on board.  Recommended eating smaller high protein, low fat meals more frequently and exercising 30 mins a day 5 times a week with a goal of 10-15lb weight loss in the next 3 months. Patient voiced their understanding and motivation to adhere to these recommendations.       Relevant Medications   insulin  degludec (TRESIBA  FLEXTOUCH) 100 UNIT/ML FlexTouch Pen   Semaglutide , 1 MG/DOSE, 4 MG/3ML SOPN   B12 deficiency   Chronic,  stable.  Continue supplement at home and recheck level today.      Relevant Orders   Vitamin B12   Chronic gout due to renal impairment of multiple sites without tophus   Chronic, ongoing.  Continue Allopurinol  100 MG daily.  Discussed at length with patient.  Check uric acid level today. Renal dose as needed.  Relevant Orders   Uric acid   Depression, major, single episode, in partial remission (HCC)   Chronic, stable.  Denies SI/HI.  Continue current medication regimen, Sertraline  100 MG daily, and adjust as needed. Recommend meditation and relaxation techniques at home.      Relevant Medications   sertraline  (ZOLOFT ) 100 MG tablet   Vitamin D  deficiency   Chronic with osteopenia.  Check Vitamin D  level and adjust as needed.      Relevant Orders   VITAMIN D  25 Hydroxy (Vit-D Deficiency, Fractures)   Other Visit Diagnoses       Encounter for annual physical exam       Annual physical today with labs and health maintenance reviewed, discussed with patient.        Follow up plan: Return in about 4 weeks (around 04/30/2023) for T2DM -- reduced insulin .   LABORATORY TESTING:  - Pap smear: not applicable  IMMUNIZATIONS:   - Tdap: Tetanus vaccination status reviewed: provide next visit. - Influenza: Up to date - Pneumovax: Up to date - Prevnar: Up to date - COVID: Up to date - HPV: Not applicable - Shingrix vaccine: Up to date  SCREENING: -Mammogram: Up to date  - Colonoscopy: Up to date  - Bone Density: Up to date  -Hearing Test: Not applicable  -Spirometry: Not applicable   PATIENT COUNSELING:   Advised to take 1 mg of folate supplement per day if capable of pregnancy.   Sexuality: Discussed sexually transmitted diseases, partner selection, use of condoms, avoidance of unintended pregnancy  and contraceptive alternatives.   Advised to avoid cigarette smoking.  I discussed with the patient that most people either abstain from alcohol or drink within safe  limits (<=14/week and <=4 drinks/occasion for males, <=7/weeks and <= 3 drinks/occasion for females) and that the risk for alcohol disorders and other health effects rises proportionally with the number of drinks per week and how often a drinker exceeds daily limits.  Discussed cessation/primary prevention of drug use and availability of treatment for abuse.   Diet: Encouraged to adjust caloric intake to maintain  or achieve ideal body weight, to reduce intake of dietary saturated fat and total fat, to limit sodium intake by avoiding high sodium foods and not adding table salt, and to maintain adequate dietary potassium and calcium  preferably from fresh fruits, vegetables, and low-fat dairy products.    Stressed the importance of regular exercise  Injury prevention: Discussed safety belts, safety helmets, smoke detector, smoking near bedding or upholstery.   Dental health: Discussed importance of regular tooth brushing, flossing, and dental visits.    NEXT PREVENTATIVE PHYSICAL DUE IN 1 YEAR. Return in about 4 weeks (around 04/30/2023) for T2DM -- reduced insulin .

## 2023-04-02 NOTE — Assessment & Plan Note (Signed)
Chronic.  At this time will continue collaboration with neurology, recent notes reviewed.

## 2023-04-02 NOTE — Assessment & Plan Note (Addendum)
 Chronic, ongoing.  Continue Allopurinol 100 MG daily.  Discussed at length with patient.  Check uric acid level today. Renal dose as needed.

## 2023-04-02 NOTE — Assessment & Plan Note (Signed)
Chronic, stable.  Denies SI/HI.  Continue current medication regimen, Sertraline 100 MG daily, and adjust as needed. Recommend meditation and relaxation techniques at home. 

## 2023-04-02 NOTE — Assessment & Plan Note (Signed)
 Chronic, stable.  BP remains at goal in office and at home for her age.  Continue current medication regimen and adjust as needed.  Alert provider if BP consistently >130/80 at home and then may restart Amlodipine  at 2.5 MG.  Continue Metoprolol  and Enalapril , adjust as needed.  Focus on DASH diet.  Enalapril  for kidney protection.  LABS: TSH and urine ALB -- suspect CBC and CMP will be performed with nephrology upcoming.

## 2023-04-02 NOTE — Assessment & Plan Note (Signed)
 Refer to diabetes with CKD plan of care.

## 2023-04-02 NOTE — Assessment & Plan Note (Signed)
 Chronic, ongoing.  Continue current medication regimen and adjust as needed. Lipid panel today.

## 2023-04-02 NOTE — Assessment & Plan Note (Signed)
 Chronic, ongoing with A1c 5.3% today and urine ALB 27 May 2022 -- will recheck today. Recommend she check sugars 2 hours after a meal + fasting in morning -- bring to visits. Have recommended she stop morning and lunch mealtime insulin  and reduce long acting to 30 units, want to avoid lows.  Educated her on insulin  and to reduce Tresiba  by 3 units if sugars consistently in 70-80 range.  Goal in long run would be to discontinue insulin  -- could increase Ozempic  to 2 MG next visit and further reduce insulin .  - Eye and foot exam up to date.   - Vaccines up to date.   - ACE and Statin on board.   - Return in 3 months.

## 2023-04-02 NOTE — Assessment & Plan Note (Signed)
Chronic, stable.  Continue supplement at home and recheck level today. 

## 2023-04-02 NOTE — Assessment & Plan Note (Signed)
Chronic, stable.  Continue collaboration with Dr. Finnegan at [facility] - recent notes and labs reviewed. 

## 2023-04-03 ENCOUNTER — Encounter: Payer: Self-pay | Admitting: Physical Therapy

## 2023-04-03 ENCOUNTER — Ambulatory Visit: Payer: Medicare HMO | Attending: Nurse Practitioner | Admitting: Physical Therapy

## 2023-04-03 ENCOUNTER — Other Ambulatory Visit: Payer: Self-pay | Admitting: Nurse Practitioner

## 2023-04-03 DIAGNOSIS — G8929 Other chronic pain: Secondary | ICD-10-CM | POA: Diagnosis not present

## 2023-04-03 DIAGNOSIS — N3281 Overactive bladder: Secondary | ICD-10-CM | POA: Diagnosis not present

## 2023-04-03 DIAGNOSIS — R7989 Other specified abnormal findings of blood chemistry: Secondary | ICD-10-CM

## 2023-04-03 DIAGNOSIS — M25562 Pain in left knee: Secondary | ICD-10-CM | POA: Diagnosis not present

## 2023-04-03 DIAGNOSIS — R2689 Other abnormalities of gait and mobility: Secondary | ICD-10-CM | POA: Diagnosis not present

## 2023-04-03 DIAGNOSIS — M5459 Other low back pain: Secondary | ICD-10-CM

## 2023-04-03 DIAGNOSIS — M533 Sacrococcygeal disorders, not elsewhere classified: Secondary | ICD-10-CM | POA: Diagnosis not present

## 2023-04-03 NOTE — Patient Instructions (Signed)
 Avoid straining pelvic floor, abdominal muscles , spine  Use log rolling technique instead of getting out of bed with your neck or the sit-up     Log rolling into and out of bed   Log rolling into and out of bed If getting out of bed on R side, Bent knees, scoot hips/ shoulder to L  Raise R arm completely overhead, rolling onto armpit  Then lower bent knees to bed to get into complete side lying position  Then drop legs off bed, and push up onto R elbow/forearm, and use L hand to push onto the bed  __   Proper body mechanics with getting out of a chair to decrease strain  on back &pelvic floor   Avoid holding your breath when Getting out of the chair:  Scoot to front part of chair chair Heels behind knees, feet are hip width apart, nose over toes  Inhale like you are smelling roses Exhale to stand   __  Sitting with feet on ground, four points of contact Catch yourself crossing ankles and thighs  __  Standing with feet hip width apart    __       Lengthen Back rib by R  shoulder   ( winging)    Lie on L  side , pillow between knees and under head  Pull  R arm overhead over mattress, grab the edge of mattress,pull it upward, drawing elbow away from ears  Breathing 10 reps  Brushing arm with 3/4 turn onto pillow behind back  Lying on L  side ,Pillow/ Block between knees     dragging top forearm across ribs below breast rotating 3/4 turn,  rotating  _R_ only this week ,  relax onto the pillow behind the back  and then back to other palm , maintain top palm on body whole top and not lift shoulder  Do this side this week       Wait do both sides until we have levelled out your spine and shoulders

## 2023-04-03 NOTE — Progress Notes (Signed)
 Good morning please let Kara Leach know that her labs have returned with exception of B12 which is still pending.  Overall labs are stable with exception of thyroid  check, TSH, this is on lower -- meaning more overactive.  This can vary dependent on many things.  I would like to recheck this outpatient in 4 weeks. The amazing staff will help you schedule a lab only visit.  (Please schedule crew).  We will see how levels look on recheck.  Any questions? Keep being amazing!!  Thank you for allowing me to participate in your care.  I appreciate you. Kindest regards, Juris Gosnell

## 2023-04-03 NOTE — Therapy (Signed)
 OUTPATIENT PHYSICAL THERAPY EVALUATION   Patient Name: Kara Leach MRN: [id number] DOB:[birth year], 73 y.o., female Today's Date: 04/03/2023   PT End of Session - 04/03/23 1423     Visit Number 1    Number of Visits 10    Date for PT Re-Evaluation 06/12/23    PT Start Time 1417    PT Stop Time 1500    PT Time Calculation (min) 43 min    Activity Tolerance Patient tolerated treatment well;No increased pain    Behavior During Therapy [facility] for tasks assessed/performed             Past Medical History:  Diagnosis Date   Allergy    Anxiety    Arthritis    Osteoarthritis   Cancer (HCC) 05/2018   possibly on left breast but not sure   Chronic kidney disease    Depression    Diabetes mellitus without complication (HCC) 2012   Diabetic neuropathy (HCC)    feet   Diverticulitis 2017   GERD (gastroesophageal reflux disease)    Headache    sinus and migraines on occasion   Hyperlipidemia    Hypertension    Last menstrual period (LMP) > 10 days ago 1986   Obesity    Peripheral vascular disease (HCC)    neuropathy in hands and feet d/t diabetes   Renal insufficiency    Stage 2 kidney disease   Seizures (HCC)    due to high blood sugars. last time = 09/2014   Sleep apnea    has CPAP, can't tolerate   Stroke (HCC) 1980   minor. left side remains weaker than right   Wears hearing aid    right side   Wrist weakness    left - after fracture and repair   Past Surgical History:  Procedure Laterality Date   ABDOMINAL HYSTERECTOMY  1986   APPENDECTOMY  1964   BREAST BIOPSY Left 07/03/2016   benign, fibrocystic changes with calcifications, usual ductal hyperplasia.    BREAST BIOPSY Left 04/23/2018   radial scar   BREAST BIOPSY Left 08/13/2018   Procedure: WIDE LOCALIZATION AND OPEN BIOPSY OF LEFT BREAST, DIABETIC;  Surgeon: Marshall Skeeter, MD;  Location: [facility] OR[facility];  Service: General;  Laterality: Left;   BREAST EXCISIONAL BIOPSY Left 08/13/2018   CHOLECYSTECTOMY   2012   COLONOSCOPY WITH PROPOFOL  N/A 12/01/2015   Procedure: COLONOSCOPY WITH PROPOFOL ;  Surgeon: Marnee Sink, MD;  Location: [facility];  Service: Endoscopy;  Laterality: N/A;  Diabetic - insulin  Sleep apnea   PATELLA FRACTURE SURGERY Left    1. had to recenter kneecap, 2 and 3 surgeries were arthroscopies   WRIST FRACTURE SURGERY Left 2005   Steel plate   Patient Active Problem List   Diagnosis Date Noted   Diabetes mellitus treated with insulin  (HCC) 04/02/2023   OAB (overactive bladder) 12/20/2022   Varicose veins with inflammation 09/10/2022   Tremor of both hands 02/27/2021   Type 2 diabetes mellitus with stage 3b chronic kidney disease, with long-term current use of insulin  (HCC) 05/15/2019   B12 deficiency 05/15/2019   Osteopenia of spine 10/09/2018   Gastroesophageal reflux disease without esophagitis 08/06/2018   Vitamin D  deficiency 08/06/2018   Chronic gout due to renal impairment of multiple sites without tophus 08/06/2018   Depression, major, single episode, in partial remission (HCC) 05/09/2017   MGUS (monoclonal gammopathy of unknown significance) 01/04/2016   Allergic rhinitis 11/04/2014   Obesity 08/15/2014   Hyperlipidemia associated with type 2  diabetes mellitus (HCC) 08/12/2014   Hypertension associated with diabetes (HCC) 08/12/2014    PCP: Alton Au   REFERRING PROVIDER:  Alton Au   REFERRING DIAG: OAB  Rationale for Evaluation and Treatment Rehabilitation  THERAPY DIAG:  Other abnormalities of gait and mobility  Sacrococcygeal disorders, not elsewhere classified  Chronic pain of left knee  Other low back pain  ONSET DATE:   SUBJECTIVE:                                                                                                                                                                                           SUBJECTIVE STATEMENT: 1) urinary leakage, decreased sense of urge to urinate: pt is wet before she feels the urge to go  urinate. Pt wears [employer] underwear all the time. Pt changes diapers 2 x day and overnight 1 x   Pt would like to be able to get to the bathroom faster, go places more because she is limited by her mobility . The medication of the bladder has helped a lot and she is not going to urinate as often .   2) L knee pain :  Pt had 4 surgeries on L knee ( 2 surgeries because her knee cap popped out of place, the other surgery was for torn ligament, one for cracking knee cap after a fall) . When she steps wrong when walking on uneven ground, stepping off step or curb, she feels the knee cap is moving out of place.  5-6/10 pain   3) LBP :  L side more pain than R, pt points to L /S junction. Nonradiating pain. 5/10 with standing or sitting for > 1 hr   PERTINENT HISTORY:    PAIN:  Are you having pain? Yes: see above   PRECAUTIONS: No   WEIGHT BEARING RESTRICTIONS: No  FALLS:  Has patient fallen in last 6 months? No  LIVING ENVIRONMENT: Lives with: younger son and dtr-in-law  Lives in: house  Stairs: 1 story, one step with rail  Has following equipment at home: sometimes use cane and walker when she is outside in the yard   OCCUPATION: retired  from [employer]   PLOF: needs dtr in Social worker to help get out of bathtub, typically she uses shower but there is no grab bar   PATIENT GOALS:   Be able to get to the bathroom faster, go places more because she is limited by her mobility     OBJECTIVE:   [facility] PT Assessment - 04/03/23 1445       Observation/Other Assessments   Observations ankles crossed      Strength  Overall Strength Comments R hip flex 2/5, L 3+/5, knee flexion B 2+/5,  B knee ext 3+/5      Palpation   Spinal mobility R shoulder/ L iliac crest lowered  standing, seated: levelled pelvis with R shoulder , supine:  low L rib, ASIS, medial malleoli higher than R ( post Tx: levelled pelvis and shoulder)    Palpation comment L knee scar restricted ,  tightness al;ong T/L junction ,  hypomobile  '      Bed Mobility   Bed Mobility --   head lift , difficulty with dropping leg of bed,     Ambulation/Gait   Gait Comments 0.68 m/ s, limited R trunk posterior rotation, R foot abducted             [facility] Adult PT Treatment/Exercise - 04/03/23 1445       Therapeutic Activites    Therapeutic Activities Other Therapeutic Activities    Other Therapeutic Activities instructed body  mechanics log rolling, sitting posture, sit to stand to improve Sx , cocreated goals      Neuro Re-ed    Neuro Re-ed Details  cued for body mechanics , Cued for HEP to address asymmetries of spine      Manual Therapy   Manual therapy comments STM/MWM at T/L junction to lengthen R side of spine               HOME EXERCISE PROGRAM: See pt instruction section    ASSESSMENT:  CLINICAL IMPRESSION:  Pt is a  73  yo  who presents with  following issues which impact QOL, ADL and community activities:   urinary leakage, decreased sense of urge to urinate  L knee pain   LBP   Pt's musculoskeletal assessment revealed uneven pelvic girdle and shoulder height, asymmetries to gait pattern, limited spinal /pelvic mobility, dyscoordination and strength of pelvic floor mm, hip weakness, poor body mechanics which places strain on the abdominal/pelvic floor mm. These are deficits that indicate an ineffective intraabdominal pressure system associated with increased risk for pt's Sx.   Pt was provided education on etiology of Sx with anatomy, physiology explanation with images along with the benefits of customized pelvic PT Tx based on pt's medical conditions and musculoskeletal deficits.  Explained the physiology of deep core mm coordination and roles of pelvic floor function in urination, defecation, sexual function, and postural control with deep core mm system.   Regional interdependent approaches will yield greater benefits in pt's POC.   Following Tx today which pt tolerated without  complaints,  pt demo'd proper body mechanics to minimize straining pelvic floor.  With manual Tx applied to T/ L junction, pt demo'd levelled shoulders/ pelvis post Tx.  Plan to reassess alignment of spine/ pelvis at next session to help promote optimize IAP system for improved pelvic floor function, trunk stability, gait, balance, stabilization with mobility tasks.  Plan to address L knee scar restriction and assess patella position.  Plan to address pelvic floor issues once pelvis and spine are realigned to yield better outcomes.   Pt benefits from skilled PT.    OBJECTIVE IMPAIRMENTS decreased activity tolerance, decreased coordination, decreased endurance, decreased mobility, difficulty walking, decreased ROM, decreased strength, decreased safety awareness, hypomobility, increased muscle spasms, impaired flexibility, improper body mechanics, postural dysfunction, and pain. scar restrictions   ACTIVITY LIMITATIONS  self-care,  sleep, home chores, work tasks    PARTICIPATION LIMITATIONS:  community activities    PERSONAL FACTORS  are also affecting patient's functional outcome.    REHAB POTENTIAL: Good   CLINICAL DECISION MAKING: Evolving/moderate complexity   EVALUATION COMPLEXITY: Moderate    PATIENT EDUCATION:    Education details: Showed pt anatomy images. Explained muscles attachments/ connection, physiology of deep core system/ spinal- thoracic-pelvis-lower kinetic chain as they relate to pt's presentation, Sx, and past Hx. Explained what and how these areas of deficits need to be restored to balance and function    See Therapeutic activity / neuromuscular re-education section  Answered pt's questions.   Person educated: Patient Education method: Explanation, Demonstration, Tactile cues, Verbal cues, and Handouts Education comprehension: verbalized understanding, returned demonstration, verbal cues required, tactile cues required, and needs further education      PLAN: PT FREQUENCY: 1x/week   PT DURATION: 10 weeks   PLANNED INTERVENTIONS: Therapeutic exercises, Therapeutic activity, Neuromuscular re-education, Balance training, Gait training, Patient/Family education, Self Care, Joint mobilization, Spinal mobilization, Moist heat, Taping, and Manual therapy, dry needling.   PLAN FOR NEXT SESSION: See clinical impression for plan     GOALS: Goals reviewed with patient? Yes  SHORT TERM GOALS: Target date: 05/01/2023    Pt will demo IND with HEP                    Baseline: Not IND            Goal status: INITIAL   LONG TERM GOALS: Target date: 06/12/2023    1.Pt will demo proper deep core coordination without chest breathing and optimal excursion of diaphragm/pelvic floor in order to promote spinal stability and pelvic floor function  Baseline: dyscoordination Goal status: INITIAL  2.  Pt will demo proper body mechanics in against gravity tasks and ADLs  work tasks, fitness  to minimize straining pelvic floor / back    Baseline: not IND, improper form that places strain on pelvic floor  Goal status: INITIAL   4. Pt will demo increased gait speed > 1.3 m/s with reciprocal gait pattern, longer stride length  in order to ambulate safely in community and return to fitness routine  Baseline:  0.68 m/ s, limited R trunk posterior rotation, R foot abducted ,,  Goal status: INITIAL    5. Pt will change < 1 diaper per day in order to be able to go to more public places  Baseline:  Pt changes [employer] underwear diapers 2 x day and overnight 1 x  Goal status: INITIAL   6. Pt will report no more knee pain on L when walking on uneven ground, stepping off step or curb in order to minimize falls  Baseline: When she steps wrong when walking on uneven ground, stepping off step or curb, she feels the knee cap is moving out of place.  5-6/10 pain  Goal status: INITIAL  7.  Pt will report decreased pain < 2/10 with  standing or sitting in car  for > 1 hr in order to travel   Baseline: LBP 5/10 with standing or sitting in car for > 1 hr  Goal status: INITIAL     [place] Andreas, PT 04/03/2023, 2:30 PM

## 2023-04-04 LAB — LIPID PANEL W/O CHOL/HDL RATIO
Cholesterol, Total: 111 mg/dL (ref 100–199)
HDL: 40 mg/dL (ref >39)
LDL Chol Calc (NIH): 50 mg/dL (ref 0–99)
Triglycerides: 117 mg/dL (ref 0–149)
VLDL Cholesterol Cal: 21 mg/dL (ref 5–40)

## 2023-04-04 LAB — MAGNESIUM: Magnesium: 1.8 mg/dL (ref 1.6–2.3)

## 2023-04-04 LAB — VITAMIN B12: Vitamin B-12: 516 pg/mL (ref 232–1245)

## 2023-04-04 LAB — VITAMIN D 25 HYDROXY (VIT D DEFICIENCY, FRACTURES): Vit D, 25-Hydroxy: 55.8 ng/mL (ref 30.0–100.0)

## 2023-04-04 LAB — TSH: TSH: 0.294 u[IU]/mL — ABNORMAL LOW (ref 0.450–4.500)

## 2023-04-04 LAB — URIC ACID: Uric Acid: 3.6 mg/dL (ref 3.0–7.2)

## 2023-04-10 ENCOUNTER — Ambulatory Visit: Payer: Medicare HMO | Admitting: Physical Therapy

## 2023-04-10 DIAGNOSIS — G8929 Other chronic pain: Secondary | ICD-10-CM | POA: Diagnosis not present

## 2023-04-10 DIAGNOSIS — N3281 Overactive bladder: Secondary | ICD-10-CM | POA: Diagnosis not present

## 2023-04-10 DIAGNOSIS — M25562 Pain in left knee: Secondary | ICD-10-CM | POA: Diagnosis not present

## 2023-04-10 DIAGNOSIS — R2689 Other abnormalities of gait and mobility: Secondary | ICD-10-CM

## 2023-04-10 DIAGNOSIS — M533 Sacrococcygeal disorders, not elsewhere classified: Secondary | ICD-10-CM | POA: Diagnosis not present

## 2023-04-10 DIAGNOSIS — M5459 Other low back pain: Secondary | ICD-10-CM

## 2023-04-10 NOTE — Therapy (Addendum)
OUTPATIENT PHYSICAL THERAPY Treatment    Patient Name: Kara Leach MRN: [id number] DOB:Mar 22, 1950, 73 y.o., female Today's Date: 04/10/2023   PT End of Session - 04/10/23 1422     Visit Number 2    Number of Visits 10    Date for PT Re-Evaluation 06/12/23    PT Start Time 1422    PT Stop Time 1500    PT Time Calculation (min) 38 min    Activity Tolerance Patient tolerated treatment well;No increased pain    Behavior During Therapy [facility] for tasks assessed/performed             Past Medical History:  Diagnosis Date   Allergy    Anxiety    Arthritis    Osteoarthritis   Cancer (HCC) 05/2018   possibly on left breast but not sure   Chronic kidney disease    Depression    Diabetes mellitus without complication (HCC) 2012   Diabetic neuropathy (HCC)    feet   Diverticulitis 2017   GERD (gastroesophageal reflux disease)    Headache    sinus and migraines on occasion   Hyperlipidemia    Hypertension    Last menstrual period (LMP) > 10 days ago 1986   Obesity    Peripheral vascular disease (HCC)    neuropathy in hands and feet d/t diabetes   Renal insufficiency    Stage 2 kidney disease   Seizures (HCC)    due to high blood sugars. last time = 09/2014   Sleep apnea    has CPAP, can't tolerate   Stroke (HCC) 1980   minor. left side remains weaker than right   Wears hearing aid    right side   Wrist weakness    left - after fracture and repair   Past Surgical History:  Procedure Laterality Date   ABDOMINAL HYSTERECTOMY  1986   APPENDECTOMY  1964   BREAST BIOPSY Left 07/03/2016   benign, fibrocystic changes with calcifications, usual ductal hyperplasia.    BREAST BIOPSY Left 04/23/2018   radial scar   BREAST BIOPSY Left 08/13/2018   Procedure: WIDE LOCALIZATION AND OPEN BIOPSY OF LEFT BREAST, DIABETIC;  Surgeon: Earline Mayotte, MD;  Location: [facility];  Service: General;  Laterality: Left;   BREAST EXCISIONAL BIOPSY Left 08/13/2018   CHOLECYSTECTOMY   2012   COLONOSCOPY WITH PROPOFOL N/A 12/01/2015   Procedure: COLONOSCOPY WITH PROPOFOL;  Surgeon: Midge Minium, MD;  Location: [facility];  Service: Endoscopy;  Laterality: N/A;  Diabetic - insulin Sleep apnea   PATELLA FRACTURE SURGERY Left    1. had to recenter kneecap, 2 and 3 surgeries were arthroscopies   WRIST FRACTURE SURGERY Left 2005   Steel plate   Patient Active Problem List   Diagnosis Date Noted   Diabetes mellitus treated with insulin (HCC) 04/02/2023   OAB (overactive bladder) 12/20/2022   Varicose veins with inflammation 09/10/2022   Tremor of both hands 02/27/2021   Type 2 diabetes mellitus with stage 3b chronic kidney disease, with long-term current use of insulin (HCC) 05/15/2019   B12 deficiency 05/15/2019   Osteopenia of spine 10/09/2018   Gastroesophageal reflux disease without esophagitis 08/06/2018   Vitamin D deficiency 08/06/2018   Chronic gout due to renal impairment of multiple sites without tophus 08/06/2018   Depression, major, single episode, in partial remission (HCC) 05/09/2017   MGUS (monoclonal gammopathy of unknown significance) 01/04/2016   Allergic rhinitis 11/04/2014   Obesity 08/15/2014   Hyperlipidemia associated with type  2 diabetes mellitus (HCC) 08/12/2014   Hypertension associated with diabetes (HCC) 08/12/2014    PCP: [facility] Dark   REFERRING PROVIDER:  Harvest Dark   REFERRING DIAG: OAB  Rationale for Evaluation and Treatment Rehabilitation  THERAPY DIAG:  Other abnormalities of gait and mobility  Sacrococcygeal disorders, not elsewhere classified  Chronic pain of left knee  Other low back pain  ONSET DATE:   SUBJECTIVE:             SUBJECTIVE STATEMENT  Pt practiced not crossing her ankles/ crossed .  Pt felt the stretches are helping her to move better. She has a tendency to tighten up.                                                                                                                                         SUBJECTIVE STATEMENT  ON EVAL 04/03/23 : 1) urinary leakage, decreased sense of urge to urinate: pt is wet before she feels the urge to go urinate. Pt wears [employer] underwear all the time. Pt changes diapers 2 x day and overnight 1 x   Pt would like to be able to get to the bathroom faster, go places more because she is limited by her mobility . The medication of the bladder has helped a lot and she is not going to urinate as often .   2) L knee pain :  Pt had 4 surgeries on L knee ( 2 surgeries because her knee cap popped out of place, the other surgery was for torn ligament, one for cracking knee cap after a fall) . When she steps wrong when walking on uneven ground, stepping off step or curb, she feels the knee cap is moving out of place.  5-6/10 pain   3) LBP :  L side more pain than R, pt points to L /S junction. Nonradiating pain. 5/10 with standing or sitting for > 1 hr   PERTINENT HISTORY:    PAIN:  Are you having pain? Yes: see above   PRECAUTIONS: No   WEIGHT BEARING RESTRICTIONS: No  FALLS:  Has patient fallen in last 6 months? No  LIVING ENVIRONMENT: Lives with: younger son and dtr-in-law  Lives in: house  Stairs: 1 story, one step with rail  Has following equipment at home: sometimes use cane and walker when she is outside in the yard   OCCUPATION: retired  from [employer]   PLOF: needs dtr in Social worker to help get out of bathtub, typically she uses shower but there is no grab bar   PATIENT GOALS:   Be able to get to the bathroom faster, go places more because she is limited by her mobility     OBJECTIVE:    [facility] PT Assessment - 04/10/23 1429       AROM   Overall AROM Comments supine: -20 deg in knee ext L  ( posT Tx:  180 deg hip ext      Palpation   Spinal mobility R shoulder/ L iliac crest lowered  standing ( post Tx: levelled shoulder and pelvis)    Palpation comment tightness along medial knee, lateral leg, hypomobile midfoot, tendency to flex toes,  limited toe abduction/ DF/EV      Ambulation/Gait   Gait Comments 0.66 m/s,  B abducted foot, more hip/knee flexion, no more trunk R lean             [facility] Adult PT Treatment/Exercise - 04/10/23 1429       Therapeutic Activites    Other Therapeutic Activities explained and cued for logrolling technique to minimize straining pelvic floor and back , explained increasing knee ext of L knee will help with L knee pain and realign spine/ shoulders      Neuro Re-ed    Neuro Re-ed Details  excessive cues for toe abduction/ transverse arches, cued for standing marches with single UE support,  cued for gait with armswings      Manual Therapy   Manual therapy comments STM/MWM at problem areas noted in assessment to ER knee, mobilize tib-fib for more DF?EV, toe abduction L                  HOME EXERCISE PROGRAM: See pt instruction section    ASSESSMENT:  CLINICAL IMPRESSION:               Pelvic and shoulder alignment was not maintained after last session 's manual Tx. Assessed LKC and noted limited knee ext where pt had L TKA, Pt regained knee ext full ROM and increased L foot mobility after mnaul Tx which was modified with decreased pressure and technique to accommodate comfort. Following Tx, pt dmeo'd levelled pelvis and shoulder in supine, seated, and standing, Also demo'd improved gait pattern with no more R trunk lean and B feet abduction versus only R foot abduction.  Pt felt L knee pain decreased to 4/10 from 5-6/10 pain.  Anticipate regional interdependent approaches will continue to yield greater benefits in pt's POC.  Provided excessive cues for toe abduction/ transverse arches, cued for standing marches with single UE support,  cued for gait with armswings.  Explained and cued for logrolling technique to minimize straining pelvic floor and back , explained increasing knee ext of L knee will help with L knee pain and realign spine/ shoulders  Plan to test and add hip  abduction strengthening next session.     Plan to address pelvic floor issues once pelvis and spine are realigned to yield better outcomes.   Pt benefits from skilled PT.    OBJECTIVE IMPAIRMENTS decreased activity tolerance, decreased coordination, decreased endurance, decreased mobility, difficulty walking, decreased ROM, decreased strength, decreased safety awareness, hypomobility, increased muscle spasms, impaired flexibility, improper body mechanics, postural dysfunction, and pain. scar restrictions   ACTIVITY LIMITATIONS  self-care,  sleep, home chores, work tasks    PARTICIPATION LIMITATIONS:  community activities    PERSONAL FACTORS        are also affecting patient's functional outcome.    REHAB POTENTIAL: Good   CLINICAL DECISION MAKING: Evolving/moderate complexity   EVALUATION COMPLEXITY: Moderate    PATIENT EDUCATION:    Education details: Showed pt anatomy images. Explained muscles attachments/ connection, physiology of deep core system/ spinal- thoracic-pelvis-lower kinetic chain as they relate to pt's presentation, Sx, and past Hx. Explained what and how these areas of deficits need to be restored to balance and function  See Therapeutic activity / neuromuscular re-education section  Answered pt's questions.   Person educated: Patient Education method: Explanation, Demonstration, Tactile cues, Verbal cues, and Handouts Education comprehension: verbalized understanding, returned demonstration, verbal cues required, tactile cues required, and needs further education     PLAN: PT FREQUENCY: 1x/week   PT DURATION: 10 weeks   PLANNED INTERVENTIONS: Therapeutic exercises, Therapeutic activity, Neuromuscular re-education, Balance training, Gait training, Patient/Family education, Self Care, Joint mobilization, Spinal mobilization, Moist heat, Taping, and Manual therapy, dry needling.   PLAN FOR NEXT SESSION: See clinical impression for plan     GOALS: Goals  reviewed with patient? Yes  SHORT TERM GOALS: Target date: 05/01/2023    Pt will demo IND with HEP                    Baseline: Not IND            Goal status: INITIAL   LONG TERM GOALS: Target date: 06/12/2023    1.Pt will demo proper deep core coordination without chest breathing and optimal excursion of diaphragm/pelvic floor in order to promote spinal stability and pelvic floor function  Baseline: dyscoordination Goal status: INITIAL  2.  Pt will demo proper body mechanics in against gravity tasks and ADLs  work tasks, fitness  to minimize straining pelvic floor / back    Baseline: not IND, improper form that places strain on pelvic floor  Goal status: INITIAL   4. Pt will demo increased gait speed > 1.3 m/s with reciprocal gait pattern, longer stride length  in order to ambulate safely in community and return to fitness routine  Baseline:  0.68 m/ s, limited R trunk posterior rotation, R foot abducted ,,  Goal status: INITIAL    5. Pt will change < 1 diaper per day in order to be able to go to more public places  Baseline:  Pt changes [employer] underwear diapers 2 x day and overnight 1 x  Goal status: INITIAL   6. Pt will report no more knee pain on L when walking on uneven ground, stepping off step or curb in order to minimize falls  Baseline: When she steps wrong when walking on uneven ground, stepping off step or curb, she feels the knee cap is moving out of place.  5-6/10 pain  Goal status: INITIAL  7.  Pt will report decreased pain < 2/10 with  standing or sitting in car for > 1 hr in order to travel   Baseline: LBP 5/10 with standing or sitting in car for > 1 hr  Goal status: INITIAL     Mariane Masters, PT 04/10/2023, 2:23 PM

## 2023-04-10 NOTE — Patient Instructions (Signed)
Standing marches with feet hip width apart, hand on counter facing perpendicular  2 min  _  Walking with higher knees, pick up feet , arm swings  __  Avoid straining pelvic floor, abdominal muscles , spine  Use log rolling technique instead of getting out of bed with your neck or the sit-up     Log rolling into and out of bed   Log rolling into and out of bed If getting out of bed on R side, Bent knees, scoot hips/ shoulder to L  Raise R arm completely overhead, rolling onto armpit  Then lower bent knees to bed to get into complete side lying position  Then drop legs off bed, and push up onto R elbow/forearm, and use L hand to push onto the bed    Dig elbows and feet to lift hte buttocks and scoot without lifting head

## 2023-04-17 ENCOUNTER — Ambulatory Visit: Payer: Medicare HMO | Admitting: Physical Therapy

## 2023-04-17 DIAGNOSIS — M533 Sacrococcygeal disorders, not elsewhere classified: Secondary | ICD-10-CM | POA: Diagnosis not present

## 2023-04-17 DIAGNOSIS — G8929 Other chronic pain: Secondary | ICD-10-CM | POA: Diagnosis not present

## 2023-04-17 DIAGNOSIS — R2689 Other abnormalities of gait and mobility: Secondary | ICD-10-CM | POA: Diagnosis not present

## 2023-04-17 DIAGNOSIS — N3281 Overactive bladder: Secondary | ICD-10-CM | POA: Diagnosis not present

## 2023-04-17 DIAGNOSIS — M5459 Other low back pain: Secondary | ICD-10-CM | POA: Diagnosis not present

## 2023-04-17 DIAGNOSIS — M25562 Pain in left knee: Secondary | ICD-10-CM | POA: Diagnosis not present

## 2023-04-17 NOTE — Therapy (Signed)
OUTPATIENT PHYSICAL THERAPY Treatment    Patient Name: Kara Leach MRN: [id number] DOB:31-Jul-1950, 73 y.o., female Today's Date: 04/17/2023   PT End of Session - 04/17/23 1424     Visit Number 3    Number of Visits 10    Date for PT Re-Evaluation 06/12/23    PT Start Time 1418    PT Stop Time 1500    PT Time Calculation (min) 42 min    Activity Tolerance Patient tolerated treatment well;No increased pain    Behavior During Therapy [facility] for tasks assessed/performed             Past Medical History:  Diagnosis Date   Allergy    Anxiety    Arthritis    Osteoarthritis   Cancer (HCC) 05/2018   possibly on left breast but not sure   Chronic kidney disease    Depression    Diabetes mellitus without complication (HCC) 2012   Diabetic neuropathy (HCC)    feet   Diverticulitis 2017   GERD (gastroesophageal reflux disease)    Headache    sinus and migraines on occasion   Hyperlipidemia    Hypertension    Last menstrual period (LMP) > 10 days ago 1986   Obesity    Peripheral vascular disease (HCC)    neuropathy in hands and feet d/t diabetes   Renal insufficiency    Stage 2 kidney disease   Seizures (HCC)    due to high blood sugars. last time = 09/2014   Sleep apnea    has CPAP, can't tolerate   Stroke (HCC) 1980   minor. left side remains weaker than right   Wears hearing aid    right side   Wrist weakness    left - after fracture and repair   Past Surgical History:  Procedure Laterality Date   ABDOMINAL HYSTERECTOMY  1986   APPENDECTOMY  1964   BREAST BIOPSY Left 07/03/2016   benign, fibrocystic changes with calcifications, usual ductal hyperplasia.    BREAST BIOPSY Left 04/23/2018   radial scar   BREAST BIOPSY Left 08/13/2018   Procedure: WIDE LOCALIZATION AND OPEN BIOPSY OF LEFT BREAST, DIABETIC;  Surgeon: Earline Mayotte, MD;  Location: [facility];  Service: General;  Laterality: Left;   BREAST EXCISIONAL BIOPSY Left 08/13/2018   CHOLECYSTECTOMY   2012   COLONOSCOPY WITH PROPOFOL N/A 12/01/2015   Procedure: COLONOSCOPY WITH PROPOFOL;  Surgeon: Midge Minium, MD;  Location: [facility];  Service: Endoscopy;  Laterality: N/A;  Diabetic - insulin Sleep apnea   PATELLA FRACTURE SURGERY Left    1. had to recenter kneecap, 2 and 3 surgeries were arthroscopies   WRIST FRACTURE SURGERY Left 2005   Steel plate   Patient Active Problem List   Diagnosis Date Noted   Diabetes mellitus treated with insulin (HCC) 04/02/2023   OAB (overactive bladder) 12/20/2022   Varicose veins with inflammation 09/10/2022   Tremor of both hands 02/27/2021   Type 2 diabetes mellitus with stage 3b chronic kidney disease, with long-term current use of insulin (HCC) 05/15/2019   B12 deficiency 05/15/2019   Osteopenia of spine 10/09/2018   Gastroesophageal reflux disease without esophagitis 08/06/2018   Vitamin D deficiency 08/06/2018   Chronic gout due to renal impairment of multiple sites without tophus 08/06/2018   Depression, major, single episode, in partial remission (HCC) 05/09/2017   MGUS (monoclonal gammopathy of unknown significance) 01/04/2016   Allergic rhinitis 11/04/2014   Obesity 08/15/2014   Hyperlipidemia associated with type  2 diabetes mellitus (HCC) 08/12/2014   Hypertension associated with diabetes (HCC) 08/12/2014    PCP: [facility]   REFERRING PROVIDER:  [facility]   REFERRING DIAG: OAB  Rationale for Evaluation and Treatment Rehabilitation  THERAPY DIAG:  Other abnormalities of gait and mobility  Sacrococcygeal disorders, not elsewhere classified  Other low back pain  Chronic pain of left knee  ONSET DATE:   SUBJECTIVE:             SUBJECTIVE STATEMENT  Pt reports 50% less L knee pain, it is much looser, decreased from 5-6/10 to 3/10  Pt noticed she is going to the toilet less often since starting PT 3 visits ago.                                                                                                                   SUBJECTIVE STATEMENT  ON EVAL 04/03/23 : 1) urinary leakage, decreased sense of urge to urinate: pt is wet before she feels the urge to go urinate. Pt wears [employer] underwear all the time. Pt changes diapers 2 x day and overnight 1 x   Pt would like to be able to get to the bathroom faster, go places more because she is limited by her mobility . The medication of the bladder has helped a lot and she is not going to urinate as often .   2) L knee pain :  Pt had 4 surgeries on L knee ( 2 surgeries because her knee cap popped out of place, the other surgery was for torn ligament, one for cracking knee cap after a fall) . When she steps wrong when walking on uneven ground, stepping off step or curb, she feels the knee cap is moving out of place.  5-6/10 pain   3) LBP :  L side more pain than R, pt points to L /S junction. Nonradiating pain. 5/10 with standing or sitting for > 1 hr   PERTINENT HISTORY:    PAIN:  Are you having pain? Yes: see above   PRECAUTIONS: No   WEIGHT BEARING RESTRICTIONS: No  FALLS:  Has patient fallen in last 6 months? No  LIVING ENVIRONMENT: Lives with: younger son and dtr-in-law  Lives in: house  Stairs: 1 story, one step with rail  Has following equipment at home: sometimes use cane and walker when she is outside in the yard   OCCUPATION: retired  from [employer]   PLOF: needs dtr in Social worker to help get out of bathtub, typically she uses shower but there is no grab bar   PATIENT GOALS:   Be able to get to the bathroom faster, go places more because she is limited by her mobility     OBJECTIVE:   [facility] PT Assessment - 04/17/23 1429       Observation/Other Assessments   Observations ankles not crossed. no cues for posture, maintaining upright posture      AROM   Overall AROM Comments supine: -20 deg in knee ext L (  last visit),  ( today: -10 deg )      Palpation   Patella mobility levelled pelvic and shoulder    Spinal mobility R thoracic  rotation compared to pelvis    Palpation comment tightness along medial knee, lateral leg, ITBand, hamstrings hypomobile L SIJ, coccygeus             [facility] Adult PT Treatment/Exercise - 04/17/23 1509       Modalities   Modalities Moist Heat      Moist Heat Therapy   Number Minutes Moist Heat 5 Minutes    Moist Heat Location --   scarum and L knee     Manual Therapy   Manual therapy comments STM/MWM at problem areas noted in assessment to ER knee, SIJ mobility                   HOME EXERCISE PROGRAM: See pt instruction section    ASSESSMENT:  CLINICAL IMPRESSION:   Improvements overall: Pt reports 50% less L knee pain, it is much looser, decreased from 5-6/10 to 3/10  Pt noticed she is going to the toilet less often since starting PT 3 visits ago.  Pelvic and shoulder alignment are levelled    Today's improvements:   supine: -20 deg in knee ext L ( last visit),  ( today: -10 deg )           Continued to apply manual Tx to increase knee ext AROM on L.  Addressed hypomobility at L SIJ which will also help with pelvic function. Plan to continue to increasing L SIJ  mobility and progress to deep core training next session Anticipate regional interdependent approaches will continue to yield greater benefits in pt's POC.  Provided excessive cues for toe abduction/ transverse arches, cued for standing marches with single UE support,  cued for gait with armswings.  Explained and cued for logrolling technique to minimize straining pelvic floor and back , explained increasing knee ext of L knee will help with L knee pain and realign spine/ shoulders  Plan to test and add hip abduction strengthening next session.     Plan to address pelvic floor issues once pelvis and spine are realigned to yield better outcomes.   Pt benefits from skilled PT.    OBJECTIVE IMPAIRMENTS decreased activity tolerance, decreased coordination, decreased endurance, decreased mobility,  difficulty walking, decreased ROM, decreased strength, decreased safety awareness, hypomobility, increased muscle spasms, impaired flexibility, improper body mechanics, postural dysfunction, and pain. scar restrictions   ACTIVITY LIMITATIONS  self-care,  sleep, home chores, work tasks    PARTICIPATION LIMITATIONS:  community activities    PERSONAL FACTORS        are also affecting patient's functional outcome.    REHAB POTENTIAL: Good   CLINICAL DECISION MAKING: Evolving/moderate complexity   EVALUATION COMPLEXITY: Moderate    PATIENT EDUCATION:    Education details: Showed pt anatomy images. Explained muscles attachments/ connection, physiology of deep core system/ spinal- thoracic-pelvis-lower kinetic chain as they relate to pt's presentation, Sx, and past Hx. Explained what and how these areas of deficits need to be restored to balance and function    See Therapeutic activity / neuromuscular re-education section  Answered pt's questions.   Person educated: Patient Education method: Explanation, Demonstration, Tactile cues, Verbal cues, and Handouts Education comprehension: verbalized understanding, returned demonstration, verbal cues required, tactile cues required, and needs further education     PLAN: PT FREQUENCY: 1x/week   PT DURATION: 10 weeks  PLANNED INTERVENTIONS: Therapeutic exercises, Therapeutic activity, Neuromuscular re-education, Balance training, Gait training, Patient/Family education, Self Care, Joint mobilization, Spinal mobilization, Moist heat, Taping, and Manual therapy, dry needling.   PLAN FOR NEXT SESSION: See clinical impression for plan     GOALS: Goals reviewed with patient? Yes  SHORT TERM GOALS: Target date: 05/01/2023    Pt will demo IND with HEP                    Baseline: Not IND            Goal status: INITIAL   LONG TERM GOALS: Target date: 06/12/2023    1.Pt will demo proper deep core coordination without chest breathing and  optimal excursion of diaphragm/pelvic floor in order to promote spinal stability and pelvic floor function  Baseline: dyscoordination Goal status: INITIAL  2.  Pt will demo proper body mechanics in against gravity tasks and ADLs  work tasks, fitness  to minimize straining pelvic floor / back    Baseline: not IND, improper form that places strain on pelvic floor  Goal status: INITIAL   4. Pt will demo increased gait speed > 1.3 m/s with reciprocal gait pattern, longer stride length  in order to ambulate safely in community and return to fitness routine  Baseline:  0.68 m/ s, limited R trunk posterior rotation, R foot abducted ,,  Goal status: INITIAL    5. Pt will change < 1 diaper per day in order to be able to go to more public places  Baseline:  Pt changes [employer] underwear diapers 2 x day and overnight 1 x  Goal status: INITIAL   6. Pt will report no more knee pain on L when walking on uneven ground, stepping off step or curb in order to minimize falls  Baseline: When she steps wrong when walking on uneven ground, stepping off step or curb, she feels the knee cap is moving out of place.  5-6/10 pain  Goal status: Partially MET  ( 50% less pain , 04/17/23: 3rd visit)    7.  Pt will report decreased pain < 2/10 with  standing or sitting in car for > 1 hr in order to travel   Baseline: LBP 5/10 with standing or sitting in car for > 1 hr  Goal status: INITIAL     Mariane Masters, PT 04/17/2023, 2:25 PM

## 2023-04-22 DIAGNOSIS — E119 Type 2 diabetes mellitus without complications: Secondary | ICD-10-CM | POA: Diagnosis not present

## 2023-04-22 DIAGNOSIS — H43813 Vitreous degeneration, bilateral: Secondary | ICD-10-CM | POA: Diagnosis not present

## 2023-04-22 DIAGNOSIS — M3501 Sicca syndrome with keratoconjunctivitis: Secondary | ICD-10-CM | POA: Diagnosis not present

## 2023-04-22 DIAGNOSIS — H2513 Age-related nuclear cataract, bilateral: Secondary | ICD-10-CM | POA: Diagnosis not present

## 2023-04-22 LAB — HM DIABETES EYE EXAM

## 2023-04-24 ENCOUNTER — Ambulatory Visit: Payer: Medicare HMO | Attending: Nurse Practitioner | Admitting: Physical Therapy

## 2023-04-24 DIAGNOSIS — G8929 Other chronic pain: Secondary | ICD-10-CM | POA: Diagnosis not present

## 2023-04-24 DIAGNOSIS — M25562 Pain in left knee: Secondary | ICD-10-CM | POA: Diagnosis not present

## 2023-04-24 DIAGNOSIS — M533 Sacrococcygeal disorders, not elsewhere classified: Secondary | ICD-10-CM | POA: Insufficient documentation

## 2023-04-24 DIAGNOSIS — M5459 Other low back pain: Secondary | ICD-10-CM | POA: Insufficient documentation

## 2023-04-24 DIAGNOSIS — R2689 Other abnormalities of gait and mobility: Secondary | ICD-10-CM | POA: Diagnosis not present

## 2023-04-24 NOTE — Therapy (Signed)
 OUTPATIENT PHYSICAL THERAPY Treatment    Patient Name: Kara Leach MRN: [id number] DOB:[birth year], 73 y.o., female Today's Date: 04/24/2023   PT End of Session - 04/24/23 1427     Visit Number 4    Number of Visits 10    Date for PT Re-Evaluation 06/12/23    PT Start Time 1422    PT Stop Time 1504    PT Time Calculation (min) 42 min    Activity Tolerance Patient tolerated treatment well;No increased pain    Behavior During Therapy [facility] for tasks assessed/performed             Past Medical History:  Diagnosis Date   Allergy    Anxiety    Arthritis    Osteoarthritis   Cancer (HCC) 05/2018   possibly on left breast but not sure   Chronic kidney disease    Depression    Diabetes mellitus without complication (HCC) 2012   Diabetic neuropathy (HCC)    feet   Diverticulitis 2017   GERD (gastroesophageal reflux disease)    Headache    sinus and migraines on occasion   Hyperlipidemia    Hypertension    Last menstrual period (LMP) > 10 days ago 1986   Obesity    Peripheral vascular disease (HCC)    neuropathy in hands and feet d/t diabetes   Renal insufficiency    Stage 2 kidney disease   Seizures (HCC)    due to high blood sugars. last time = 09/2014   Sleep apnea    has CPAP, can't tolerate   Stroke (HCC) 1980   minor. left side remains weaker than right   Wears hearing aid    right side   Wrist weakness    left - after fracture and repair   Past Surgical History:  Procedure Laterality Date   ABDOMINAL HYSTERECTOMY  1986   APPENDECTOMY  1964   BREAST BIOPSY Left 07/03/2016   benign, fibrocystic changes with calcifications, usual ductal hyperplasia.    BREAST BIOPSY Left 04/23/2018   radial scar   BREAST BIOPSY Left 08/13/2018   Procedure: WIDE LOCALIZATION AND OPEN BIOPSY OF LEFT BREAST, DIABETIC;  Surgeon: Dessa Reyes ORN, MD;  Location: [facility];  Service: General;  Laterality: Left;   BREAST EXCISIONAL BIOPSY Left 08/13/2018   CHOLECYSTECTOMY   2012   COLONOSCOPY WITH PROPOFOL  N/A 12/01/2015   Procedure: COLONOSCOPY WITH PROPOFOL ;  Surgeon: Rogelia Copping, MD;  Location: [facility];  Service: Endoscopy;  Laterality: N/A;  Diabetic - insulin  Sleep apnea   PATELLA FRACTURE SURGERY Left    1. had to recenter kneecap, 2 and 3 surgeries were arthroscopies   WRIST FRACTURE SURGERY Left 2005   Steel plate   Patient Active Problem List   Diagnosis Date Noted   Diabetes mellitus treated with insulin  (HCC) 04/02/2023   OAB (overactive bladder) 12/20/2022   Varicose veins with inflammation 09/10/2022   Tremor of both hands 02/27/2021   Type 2 diabetes mellitus with stage 3b chronic kidney disease, with long-term current use of insulin  (HCC) 05/15/2019   B12 deficiency 05/15/2019   Osteopenia of spine 10/09/2018   Gastroesophageal reflux disease without esophagitis 08/06/2018   Vitamin D  deficiency 08/06/2018   Chronic gout due to renal impairment of multiple sites without tophus 08/06/2018   Depression, major, single episode, in partial remission (HCC) 05/09/2017   MGUS (monoclonal gammopathy of unknown significance) 01/04/2016   Allergic rhinitis 11/04/2014   Obesity 08/15/2014   Hyperlipidemia associated with type  2 diabetes mellitus (HCC) 08/12/2014   Hypertension associated with diabetes (HCC) 08/12/2014    PCP: Valerio   REFERRING PROVIDER:  Valerio   REFERRING DIAG: OAB  Rationale for Evaluation and Treatment Rehabilitation  THERAPY DIAG:  Other abnormalities of gait and mobility  Sacrococcygeal disorders, not elsewhere classified  Other low back pain  Chronic pain of left knee  ONSET DATE:   SUBJECTIVE:             SUBJECTIVE STATEMENT  Pt reports LBP is at 3/10 from 5/10  since last Tx.  L knee pain is 2-3 /10 from 5-6/10.                                                                                                                    SUBJECTIVE STATEMENT  ON EVAL 04/03/23 : 1) urinary leakage,  decreased sense of urge to urinate: pt is wet before she feels the urge to go urinate. Pt wears [employer] underwear all the time. Pt changes diapers 2 x day and overnight 1 x   Pt would like to be able to get to the bathroom faster, go places more because she is limited by her mobility . The medication of the bladder has helped a lot and she is not going to urinate as often .   2) L knee pain :  Pt had 4 surgeries on L knee ( 2 surgeries because her knee cap popped out of place, the other surgery was for torn ligament, one for cracking knee cap after a fall) . When she steps wrong when walking on uneven ground, stepping off step or curb, she feels the knee cap is moving out of place.  5-6/10 pain   3) LBP :  L side more pain than R, pt points to L /S junction. Nonradiating pain. 5/10 with standing or sitting for > 1 hr   PERTINENT HISTORY:    PAIN:  Are you having pain? Yes: see above   PRECAUTIONS: No   WEIGHT BEARING RESTRICTIONS: No  FALLS:  Has patient fallen in last 6 months? No  LIVING ENVIRONMENT: Lives with: younger son and dtr-in-law  Lives in: house  Stairs: 1 story, one step with rail  Has following equipment at home: sometimes use cane and walker when she is outside in the yard   OCCUPATION: retired  from [employer]   PLOF: needs dtr in social worker to help get out of bathtub, typically she uses shower but there is no grab bar   PATIENT GOALS:   Be able to get to the bathroom faster, go places more because she is limited by her mobility     OBJECTIVE:     [facility] PT Assessment - 04/24/23 1430       Strength   Overall Strength Comments B LE 4-/5      Palpation    mobility levelled pelvis and shoudler but R shoulder more protracted , rotated anteriorly   Tightness along C/ T junction and medial scapula  R paraspinals,      Ambulation/Gait   Gait Comments 0.88 m/s ( reciporcal gait , B feet abductedd, more hip / knee flexion compared to eval:  0.68 m/ s, limited R trunk  posterior rotation, R foot abducted ,            [facility] Adult PT Treatment/Exercise - 04/24/23 1430       Therapeutic Activites    Other Therapeutic Activities assisted pt to sitting position when she voiced she needed to vomit, Provided receptacle for vomit, water  and ginger ale. Pt reported no more nausea and felt goot to drive pt was left at table in cafe, pt voiced she is ok to drive     Neuro Re-ed    Neuro Re-ed Details  cued for neck ROM but withheld from HEP due to adverse raction when cued for cervical extension and pt needed to sit up and vomitted. Pt had just ate a sandwich close to beginning of session she said.      Manual Therapy   Manual therapy comments STM/MWM at problem areas noted in assessment to promote less anterior rotation of R thorax, more scapular stabilization/ less rounded shoulder to promote optimal diaphragmatic excursion                HOME EXERCISE PROGRAM: See pt instruction section    ASSESSMENT:  CLINICAL IMPRESSION:   Improvements overall: Pt reports 50% less L knee pain, it is much looser, decreased from 5-6/10 to 3/10  Pt noticed she is going to the toilet less often since starting PT 3 visits ago.  Pelvic and shoulder alignment are levelled  supine: -20 deg in knee ext L ( last visit),  ( -10 deg )   Today's improvements:  Pt reports LBP is at 3/10 from 5/10  since last Tx.  L knee pain is 2-3 /10 from 5-6/10.            Demo'd improved gait speed 0.88 m/s ( reciporcal gait , B feet abductedd, more hip / knee flexion compared to eval:  0.68 m/ s, limited R trunk posterior rotation, R foot abducted          Continued to apply manual Tx and modified technique for comfort to improve mobility at C/ T junction and medial scapula R paraspinals, Pt showed less protracted R shoulder/ less rounded shoulder, no more lowered R shoulder and less pain.   Cued for neck ROM but withheld from HEP due to adverse raction when cued for cervical  extension and pt needed to sit up and vomitted. Pt had just ate a sandwich close to beginning of session she said.   Assisted pt to sitting position when she voiced she needed to vomit, Provided receptacle for vomit, water , and ginger ale. Pt reported no more nausea and felt goot to drive pt was left at table in cafe, pt voiced she is ok to drive. Plan to have pt in semi reclined position upcoming sessions for neck ROM HEP next session.                 Anticipate levelled shoulder alignment and improved thoracic area will help progress to deep core training next session with more optimal diaphragm and deep core function.   Anticipate regional interdependent approaches will continue to yield greater benefits in pt's POC.   Plan to test and add hip abduction strengthening next session.    Plan to address pelvic floor issues once pelvis and spine are  realigned to yield better outcomes.   Pt benefits from skilled PT.    OBJECTIVE IMPAIRMENTS decreased activity tolerance, decreased coordination, decreased endurance, decreased mobility, difficulty walking, decreased ROM, decreased strength, decreased safety awareness, hypomobility, increased muscle spasms, impaired flexibility, improper body mechanics, postural dysfunction, and pain. scar restrictions   ACTIVITY LIMITATIONS  self-care,  sleep, home chores, work tasks    PARTICIPATION LIMITATIONS:  community activities    PERSONAL FACTORS        are also affecting patient's functional outcome.    REHAB POTENTIAL: Good   CLINICAL DECISION MAKING: Evolving/moderate complexity   EVALUATION COMPLEXITY: Moderate    PATIENT EDUCATION:    Education details: Showed pt anatomy images. Explained muscles attachments/ connection, physiology of deep core system/ spinal- thoracic-pelvis-lower kinetic chain as they relate to pt's presentation, Sx, and past Hx. Explained what and how these areas of deficits need to be restored to balance and function     See Therapeutic activity / neuromuscular re-education section  Answered pt's questions.   Person educated: Patient Education method: Explanation, Demonstration, Tactile cues, Verbal cues, and Handouts Education comprehension: verbalized understanding, returned demonstration, verbal cues required, tactile cues required, and needs further education     PLAN: PT FREQUENCY: 1x/week   PT DURATION: 10 weeks   PLANNED INTERVENTIONS: Therapeutic exercises, Therapeutic activity, Neuromuscular re-education, Balance training, Gait training, Patient/Family education, Self Care, Joint mobilization, Spinal mobilization, Moist heat, Taping, and Manual therapy, dry needling.   PLAN FOR NEXT SESSION: See clinical impression for plan     GOALS: Goals reviewed with patient? Yes  SHORT TERM GOALS: Target date: 05/01/2023    Pt will demo IND with HEP                    Baseline: Not IND            Goal status: INITIAL   LONG TERM GOALS: Target date: 06/12/2023    1.Pt will demo proper deep core coordination without chest breathing and optimal excursion of diaphragm/pelvic floor in order to promote spinal stability and pelvic floor function  Baseline: dyscoordination Goal status: INITIAL  2.  Pt will demo proper body mechanics in against gravity tasks and ADLs  work tasks, fitness  to minimize straining pelvic floor / back    Baseline: not IND, improper form that places strain on pelvic floor  Goal status: INITIAL   4. Pt will demo increased gait speed > 1.0 m/s with reciprocal gait pattern, longer stride length  in order to ambulate safely in community and return to fitness routine  Baseline:  0.68 m/ s, limited R trunk posterior rotation, R foot abducted , Goal status:  Partially met  (04/24/23: 4th visit 0.88 m/s ( reciporcal gait , B feet abductedd, more hip / knee flexion )      5. Pt will change < 1 diaper per day in order to be able to go to more public places  Baseline:  Pt  changes [employer] underwear diapers 2 x day and overnight 1 x  Goal status: INITIAL   6. Pt will report no more knee pain on L when walking on uneven ground, stepping off step or curb in order to minimize falls  Baseline: When she steps wrong when walking on uneven ground, stepping off step or curb, she feels the knee cap is moving out of place.  5-6/10 pain  Goal status: Partially MET  ( 50% less pain , 04/17/23: 3rd visit)  7.  Pt will report decreased pain < 2/10 with  standing or sitting in car for > 1 hr in order to travel   Baseline: LBP 5/10 with standing or sitting in car for > 1 hr  Goal status: MET ( 04/24/23:     Pia Lupe Plump, PT 04/24/2023, 2:27 PM

## 2023-05-01 ENCOUNTER — Ambulatory Visit: Payer: Medicare HMO | Admitting: Physical Therapy

## 2023-05-01 DIAGNOSIS — M25562 Pain in left knee: Secondary | ICD-10-CM | POA: Diagnosis not present

## 2023-05-01 DIAGNOSIS — G8929 Other chronic pain: Secondary | ICD-10-CM

## 2023-05-01 DIAGNOSIS — M533 Sacrococcygeal disorders, not elsewhere classified: Secondary | ICD-10-CM | POA: Diagnosis not present

## 2023-05-01 DIAGNOSIS — R2689 Other abnormalities of gait and mobility: Secondary | ICD-10-CM | POA: Diagnosis not present

## 2023-05-01 DIAGNOSIS — M5459 Other low back pain: Secondary | ICD-10-CM | POA: Diagnosis not present

## 2023-05-01 NOTE — Therapy (Signed)
 OUTPATIENT PHYSICAL THERAPY Treatment    Patient Name: Kara Leach MRN: [id number] DOB:September 08, 1950, 73 y.o., female Today's Date: 05/01/2023   PT End of Session - 05/01/23 1437     Visit Number 5    Number of Visits 10    Date for PT Re-Evaluation 06/12/23    PT Start Time 1419    PT Stop Time 1500    PT Time Calculation (min) 41 min    Activity Tolerance Patient tolerated treatment well;No increased pain    Behavior During Therapy [facility] for tasks assessed/performed             Past Medical History:  Diagnosis Date   Allergy    Anxiety    Arthritis    Osteoarthritis   Cancer (HCC) 05/2018   possibly on left breast but not sure   Chronic kidney disease    Depression    Diabetes mellitus without complication (HCC) 2012   Diabetic neuropathy (HCC)    feet   Diverticulitis 2017   GERD (gastroesophageal reflux disease)    Headache    sinus and migraines on occasion   Hyperlipidemia    Hypertension    Last menstrual period (LMP) > 10 days ago 1986   Obesity    Peripheral vascular disease (HCC)    neuropathy in hands and feet d/t diabetes   Renal insufficiency    Stage 2 kidney disease   Seizures (HCC)    due to high blood sugars. last time = 09/2014   Sleep apnea    has CPAP, can't tolerate   Stroke (HCC) 1980   minor. left side remains weaker than right   Wears hearing aid    right side   Wrist weakness    left - after fracture and repair   Past Surgical History:  Procedure Laterality Date   ABDOMINAL HYSTERECTOMY  1986   APPENDECTOMY  1964   BREAST BIOPSY Left 07/03/2016   benign, fibrocystic changes with calcifications, usual ductal hyperplasia.    BREAST BIOPSY Left 04/23/2018   radial scar   BREAST BIOPSY Left 08/13/2018   Procedure: WIDE LOCALIZATION AND OPEN BIOPSY OF LEFT BREAST, DIABETIC;  Surgeon: Earline Mayotte, MD;  Location: [facility];  Service: General;  Laterality: Left;   BREAST EXCISIONAL BIOPSY Left 08/13/2018   CHOLECYSTECTOMY   2012   COLONOSCOPY WITH PROPOFOL N/A 12/01/2015   Procedure: COLONOSCOPY WITH PROPOFOL;  Surgeon: Midge Minium, MD;  Location: [facility];  Service: Endoscopy;  Laterality: N/A;  Diabetic - insulin Sleep apnea   PATELLA FRACTURE SURGERY Left    1. had to recenter kneecap, 2 and 3 surgeries were arthroscopies   WRIST FRACTURE SURGERY Left 2005   Steel plate   Patient Active Problem List   Diagnosis Date Noted   Diabetes mellitus treated with insulin (HCC) 04/02/2023   OAB (overactive bladder) 12/20/2022   Varicose veins with inflammation 09/10/2022   Tremor of both hands 02/27/2021   Type 2 diabetes mellitus with stage 3b chronic kidney disease, with long-term current use of insulin (HCC) 05/15/2019   B12 deficiency 05/15/2019   Osteopenia of spine 10/09/2018   Gastroesophageal reflux disease without esophagitis 08/06/2018   Vitamin D deficiency 08/06/2018   Chronic gout due to renal impairment of multiple sites without tophus 08/06/2018   Depression, major, single episode, in partial remission (HCC) 05/09/2017   MGUS (monoclonal gammopathy of unknown significance) 01/04/2016   Allergic rhinitis 11/04/2014   Obesity 08/15/2014   Hyperlipidemia associated with type  2 diabetes mellitus (HCC) 08/12/2014   Hypertension associated with diabetes (HCC) 08/12/2014    PCP: Harvest Dark   REFERRING PROVIDER:  Harvest Dark   REFERRING DIAG: OAB  Rationale for Evaluation and Treatment Rehabilitation  THERAPY DIAG:  Other abnormalities of gait and mobility  Sacrococcygeal disorders, not elsewhere classified  Other low back pain  Chronic pain of left knee  ONSET DATE:   SUBJECTIVE:             SUBJECTIVE STATEMENT  Pt reports she had no leakage before she got to the toilet and also only went once every 2 hrs instead of once every 45 min to 1 hr.                                                                                                                SUBJECTIVE STATEMENT  ON  EVAL 04/03/23 : 1) urinary leakage, decreased sense of urge to urinate: pt is wet before she feels the urge to go urinate. Pt wears [employer] Because underwear all the time. Pt changes diapers 2 x day and overnight 1 x   Pt would like to be able to get to the bathroom faster, go places more because she is limited by her mobility . The medication of the bladder has helped a lot and she is not going to urinate as often .   2) L knee pain :  Pt had 4 surgeries on L knee ( 2 surgeries because her knee cap popped out of place, the other surgery was for torn ligament, one for cracking knee cap after a fall) . When she steps wrong when walking on uneven ground, stepping off step or curb, she feels the knee cap is moving out of place.  5-6/10 pain   3) LBP :  L side more pain than R, pt points to L /S junction. Nonradiating pain. 5/10 with standing or sitting for > 1 hr   PERTINENT HISTORY:    PAIN:  Are you having pain? Yes: see above   PRECAUTIONS: No   WEIGHT BEARING RESTRICTIONS: No  FALLS:  Has patient fallen in last 6 months? No  LIVING ENVIRONMENT: Lives with: younger son and dtr-in-law  Lives in: house  Stairs: 1 story, one step with rail  Has following equipment at home: sometimes use cane and walker when she is outside in the yard   OCCUPATION: retired  from [employer]   PLOF: needs dtr in Social worker to help get out of bathtub, typically she uses shower but there is no grab bar   PATIENT GOALS:   Be able to get to the bathroom faster, go places more because she is limited by her mobility     OBJECTIVE:       HOME EXERCISE PROGRAM: See pt instruction section    ASSESSMENT:  CLINICAL IMPRESSION:   Improvements overall: Pt reports 50% less L knee pain, it is much looser, decreased from 5-6/10 to 3/10  Pt noticed she is going to the toilet less often since starting  PT 3 visits ago.  Pelvic and shoulder alignment are levelled  supine: -20 deg in knee ext L ( last visit),  ( -10 deg  )  Pt reports LBP is at 3/10 from 5/10  since last Tx.  L knee pain is 2-3 /10 from 5-6/10.            Demo'd improved gait speed 0.88 m/s ( reciporcal gait , B feet abductedd, more hip / knee flexion compared to eval:  0.68 m/ s, limited R trunk posterior rotation, R foot abducted  Today's improvements:   Pt reports she had no leakage before she got to the toilet and also only went once every 2 hrs instead of once every 45 min to 1 hr.                 Continued to apply manual Tx and modified technique for comfort to improve mobility at C/ T junction on L. Pt demo'd improved cervical ROM and no longer overuses for upper trap mm. Pt demo'd improved diaphragm exursion for deep core coordination. Reviewed deep core level 1-2 training with semi reclined position. Pt demo'd improved coordination.                Plan to progress to cervico/scapular training next session with more optimal diaphragm and deep core function.   Anticipate regional interdependent approaches will continue to yield greater benefits in pt's POC.   Plan to test and add hip abduction strengthening next session.     Pt benefits from skilled PT.    OBJECTIVE IMPAIRMENTS decreased activity tolerance, decreased coordination, decreased endurance, decreased mobility, difficulty walking, decreased ROM, decreased strength, decreased safety awareness, hypomobility, increased muscle spasms, impaired flexibility, improper body mechanics, postural dysfunction, and pain. scar restrictions   ACTIVITY LIMITATIONS  self-care,  sleep, home chores, work tasks    PARTICIPATION LIMITATIONS:  community activities    PERSONAL FACTORS        are also affecting patient's functional outcome.    REHAB POTENTIAL: Good   CLINICAL DECISION MAKING: Evolving/moderate complexity   EVALUATION COMPLEXITY: Moderate    PATIENT EDUCATION:    Education details: Showed pt anatomy images. Explained muscles attachments/ connection, physiology of deep  core system/ spinal- thoracic-pelvis-lower kinetic chain as they relate to pt's presentation, Sx, and past Hx. Explained what and how these areas of deficits need to be restored to balance and function    See Therapeutic activity / neuromuscular re-education section  Answered pt's questions.   Person educated: Patient Education method: Explanation, Demonstration, Tactile cues, Verbal cues, and Handouts Education comprehension: verbalized understanding, returned demonstration, verbal cues required, tactile cues required, and needs further education     PLAN: PT FREQUENCY: 1x/week   PT DURATION: 10 weeks   PLANNED INTERVENTIONS: Therapeutic exercises, Therapeutic activity, Neuromuscular re-education, Balance training, Gait training, Patient/Family education, Self Care, Joint mobilization, Spinal mobilization, Moist heat, Taping, and Manual therapy, dry needling.   PLAN FOR NEXT SESSION: See clinical impression for plan     GOALS: Goals reviewed with patient? Yes  SHORT TERM GOALS: Target date: 05/01/2023    Pt will demo IND with HEP                    Baseline: Not IND            Goal status: INITIAL   LONG TERM GOALS: Target date: 06/12/2023    1.Pt will demo proper deep core coordination without chest breathing and optimal excursion  of diaphragm/pelvic floor in order to promote spinal stability and pelvic floor function  Baseline: dyscoordination Goal status: INITIAL  2.  Pt will demo proper body mechanics in against gravity tasks and ADLs  work tasks, fitness  to minimize straining pelvic floor / back    Baseline: not IND, improper form that places strain on pelvic floor  Goal status: INITIAL   4. Pt will demo increased gait speed > 1.0 m/s with reciprocal gait pattern, longer stride length  in order to ambulate safely in community and return to fitness routine  Baseline:  0.68 m/ s, limited R trunk posterior rotation, R foot abducted , Goal status:  Partially met   (04/24/23: 4th visit 0.88 m/s ( reciporcal gait , B feet abductedd, more hip / knee flexion )      5. Pt will change < 1 diaper per day in order to be able to go to more public places  Baseline:  Pt changes [employer] underwear diapers 2 x day and overnight 1 x  Goal status: INITIAL   6. Pt will report no more knee pain on L when walking on uneven ground, stepping off step or curb in order to minimize falls  Baseline: When she steps wrong when walking on uneven ground, stepping off step or curb, she feels the knee cap is moving out of place.  5-6/10 pain  Goal status: Partially MET  ( 50% less pain , 04/17/23: 3rd visit)    7.  Pt will report decreased pain < 2/10 with  standing or sitting in car for > 1 hr in order to travel   Baseline: LBP 5/10 with standing or sitting in car for > 1 hr  Goal status: MET ( 04/24/23:     Mariane Masters, PT 05/01/2023, 3:13 PM

## 2023-05-05 NOTE — Patient Instructions (Incomplete)
 Do not take dinner insulin if pre meal sugar is <130.  Goal is to stop all meal time insulin.  Next step would be to reduce [place].  Be Involved in Caring For Your Health:  Taking Medications When medications are taken as directed, they can greatly improve your health. But if they are not taken as prescribed, they may not work. In some cases, not taking them correctly can be harmful. To help ensure your treatment remains effective and safe, understand your medications and how to take them. Bring your medications to each visit for review by your provider.  Your lab results, notes, and after visit summary will be available on My Chart. We strongly encourage you to use this feature. If lab results are abnormal the clinic will contact you with the appropriate steps. If the clinic does not contact you assume the results are satisfactory. You can always view your results on My Chart. If you have questions regarding your health or results, please contact the clinic during office hours. You can also ask questions on My Chart.  We at [facility] are grateful that you chose [place] to provide your care. We strive to provide evidence-based and compassionate care and are always looking for feedback. If you get a survey from the clinic please complete this so we can hear your opinions.  Diabetes Mellitus and Exercise Regular exercise is important for your health, especially if you have diabetes mellitus. Exercise is not just about losing weight. It can also help you increase muscle strength and bone density and reduce body fat and stress. This can help your level of endurance and make you more fit and flexible. Why should I exercise if I have diabetes? Exercise has many benefits for people with diabetes. It can: Help lower and control your blood sugar (glucose). Help your body respond better and become more sensitive to the hormone insulin. Reduce how much insulin your body needs. Lower your risk for  heart disease by: Lowering how much "bad" cholesterol and triglycerides you have in your body. Increasing how much "good" cholesterol you have in your body. Lowering your blood pressure. Lowering your blood glucose levels. What is my activity plan? Your health care provider or an expert trained in diabetes care (certified diabetes educator) can help you make an activity plan. This plan can help you find the type of exercise that works for you. It may also tell you how often to exercise and for how long. Be sure to: Get at least 150 minutes of medium-intensity or high-intensity exercise each week. This may involve brisk walking, biking, or water aerobics. Do stretching and strengthening exercises at least 2 times a week. This may involve yoga or weight lifting. Spread out your activity over at least 3 days of the week. Get some form of physical activity each day. Do not go more than 2 days in a row without some kind of activity. Avoid being inactive for more than 30 minutes at a time. Take frequent breaks to walk or stretch. Choose activities that you enjoy. Set goals that you know you can accomplish. Start slowly and increase the intensity of your exercise over time. How do I manage my diabetes during exercise?  Monitor your blood glucose Check your blood glucose before and after you exercise. If your blood glucose is 240 mg/dL (65.7 mmol/L) or higher before you exercise, check your urine for ketones. These are chemicals created by the liver. If you have ketones in your urine, do not  exercise until your blood glucose returns to normal. If your blood glucose is 100 mg/dL (5.6 mmol/L) or lower, eat a snack that has 15-20 grams of carbohydrate in it. Check your blood glucose 15 minutes after the snack to make sure that your level is above 100 mg/dL (5.6 mmol/L) before you start to exercise. Your risk for low blood glucose (hypoglycemia) goes up during and after exercise. Know the symptoms of this  condition and how to treat it. Follow these instructions at home: Keep a carbohydrate snack on hand for use before, during, and after exercise. This can help prevent or treat hypoglycemia. Avoid injecting insulin into parts of your body that are going to be used during exercise. This may include: Your arms, when you are going to play tennis. Your legs, when you are about to go jogging. Keep track of your exercise habits. This can help you and your health care provider watch and adjust your activity plan. Write down: What you eat before and after you exercise. Blood glucose levels before and after you exercise. The type and amount of exercise you do. Talk to your health care provider before you start a new activity. They may need to: Make sure that the activity is safe for you. Adjust your insulin, other medicines, and food that you eat. Drink water while you exercise. This can stop you from losing too much water (dehydration). It can also prevent problems caused by having a lot of heat in your body (heat stroke). Where to find more information American Diabetes Association: diabetes.org Association of Diabetes Care & Education Specialists: diabeteseducator.org This information is not intended to replace advice given to you by your health care provider. Make sure you discuss any questions you have with your health care provider. Document Revised: 08/23/2021 Document Reviewed: 08/23/2021 Elsevier Patient Education  2024 [employer].

## 2023-05-08 ENCOUNTER — Ambulatory Visit: Payer: Medicare HMO | Admitting: Physical Therapy

## 2023-05-10 ENCOUNTER — Encounter: Payer: Self-pay | Admitting: Nurse Practitioner

## 2023-05-10 ENCOUNTER — Ambulatory Visit: Payer: Medicare HMO | Admitting: Nurse Practitioner

## 2023-05-10 VITALS — BP 122/75 | HR 94 | Temp 97.4°F | Ht 60.0 in | Wt 143.6 lb

## 2023-05-10 DIAGNOSIS — E1122 Type 2 diabetes mellitus with diabetic chronic kidney disease: Secondary | ICD-10-CM

## 2023-05-10 DIAGNOSIS — N1832 Chronic kidney disease, stage 3b: Secondary | ICD-10-CM | POA: Diagnosis not present

## 2023-05-10 DIAGNOSIS — Z794 Long term (current) use of insulin: Secondary | ICD-10-CM | POA: Diagnosis not present

## 2023-05-10 NOTE — Assessment & Plan Note (Addendum)
 Chronic, ongoing with A1c 5.3% in January and urine ALB 18 April 2023. Recommend she check sugars 2 hours after a meal + fasting in morning -- bring to visits. Have recommended she stop all meal time insulin (she is doing well without morning and lunch with sugars remaining stable, she will stop dinner time) and reduce long acting to 32 units, want to avoid lows.  Educated her on insulin and to reduce [place] by 3 units if sugars consistently in 70-80 range.  Goal in long run would be to discontinue insulin -- could increase Ozempic to 2 MG next visit and further reduce insulin.  - Eye and foot exam up to date.   - Vaccines up to date.   - ACE and Statin on board.   - Return in 3 months.

## 2023-05-10 NOTE — Progress Notes (Signed)
 BP 122/75   Pulse 94   Temp (!) 97.4 F (36.3 C) (Oral)   Ht 5' (1.524 m)   Wt 143 lb 9.6 oz (65.1 kg)   LMP  (LMP Unknown)   SpO2 97%   BMI 28.04 kg/m    Subjective:    Patient ID: Kara Leach, female    DOB: 04-20-1950, 73 y.o.   MRN: [id number]  HPI: Kara Leach is a 73 y.o. female  Chief Complaint  Patient presents with   Diabetes    Eye exam requested from [facility]   DIABETES Follow-up for insulin reduction ,she stopped Novolog in the morning and lunch.  A1c 5.3% January 2025.  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   Hypoglycemic episodes:no Polydipsia/polyuria: no Visual disturbance: no Chest pain: no Paresthesias: no Glucose Monitoring: yes  Accucheck frequency: Daily  Fasting glucose: 92 this morning, 88 to 103  Post prandial:  Evening:  Before meals: Taking Insulin?: yes  Long acting insulin: Tresiba 35 units  Short acting insulin: Novolog at dinner 14 units Blood Pressure Monitoring: daily Retinal Examination: Up to Date Foot Exam: Up to Date Diabetic Education: Not Completed Pneumovax: Up to Date Influenza: Up to Date Aspirin: yes   Relevant past medical, surgical, family and social history reviewed and updated as indicated. Interim medical history since our last visit reviewed. Allergies and medications reviewed and updated.  Review of Systems  Constitutional:  Negative for activity change, appetite change, diaphoresis, fatigue and fever.  Respiratory:  Negative for cough, chest tightness and shortness of breath.   Cardiovascular:  Negative for chest pain, palpitations and leg swelling.  Gastrointestinal: Negative.   Endocrine: Negative for cold intolerance, heat intolerance, polydipsia, polyphagia and polyuria.  Neurological: Negative.   Psychiatric/Behavioral: Negative.      Per HPI unless specifically indicated above     Objective:    BP 122/75   Pulse 94    Temp (!) 97.4 F (36.3 C) (Oral)   Ht 5' (1.524 m)   Wt 143 lb 9.6 oz (65.1 kg)   LMP  (LMP Unknown)   SpO2 97%   BMI 28.04 kg/m   Wt Readings from Last 3 Encounters:  05/10/23 143 lb 9.6 oz (65.1 kg)  04/02/23 147 lb 9.6 oz (67 kg)  01/28/23 150 lb (68 kg)    Physical Exam Vitals and nursing note reviewed.  Constitutional:      General: She is awake. She is not in acute distress.    Appearance: She is well-developed and well-groomed. She is obese. She is not ill-appearing or toxic-appearing.  HENT:     Head: Normocephalic.     Right Ear: Hearing and external ear normal.     Left Ear: Hearing and external ear normal.  Eyes:     General: Lids are normal.        Right eye: No discharge.        Left eye: No discharge.     Conjunctiva/sclera: Conjunctivae normal.     Pupils: Pupils are equal, round, and reactive to light.  Neck:     Thyroid: No thyromegaly.     Vascular: No carotid bruit.  Cardiovascular:     Rate and Rhythm: Normal rate and regular rhythm.     Heart sounds: Normal heart sounds. No murmur heard.    No gallop.  Pulmonary:     Effort: Pulmonary effort is normal. No accessory muscle usage or respiratory  distress.     Breath sounds: Normal breath sounds.  Abdominal:     General: Bowel sounds are normal. There is no distension.     Palpations: Abdomen is soft.     Tenderness: There is no abdominal tenderness.  Musculoskeletal:     Cervical back: Normal range of motion and neck supple.     Right lower leg: No edema.     Left lower leg: No edema.  Lymphadenopathy:     Cervical: No cervical adenopathy.  Skin:    General: Skin is warm and dry.  Neurological:     Mental Status: She is alert and oriented to person, place, and time.     Deep Tendon Reflexes: Reflexes are normal and symmetric.     Reflex Scores:      Brachioradialis reflexes are 2+ on the right side and 2+ on the left side.      Patellar reflexes are 2+ on the right side and 2+ on the left  side. Psychiatric:        Attention and Perception: Attention normal.        Mood and Affect: Mood normal.        Speech: Speech normal.        Behavior: Behavior normal. Behavior is cooperative.        Thought Content: Thought content normal.    Results for orders placed or performed in visit on 04/02/23  Bayer DCA Hb A1c Waived   Collection Time: 04/02/23 10:31 AM  Result Value Ref Range   HB A1C (BAYER DCA - WAIVED) 5.3 4.8 - 5.6 %  Microalbumin, Urine Waived   Collection Time: 04/02/23 10:31 AM  Result Value Ref Range   Microalb, Ur Waived 30 (H) 0 - 19 mg/L   Creatinine, Urine Waived 100 10 - 300 mg/dL   Microalb/Creat Ratio <30 <30 mg/g  Lipid Panel w/o Chol/HDL Ratio   Collection Time: 04/02/23 10:31 AM  Result Value Ref Range   Cholesterol, Total 111 100 - 199 mg/dL   Triglycerides 161 0 - 149 mg/dL   HDL 40 >09 mg/dL   VLDL Cholesterol Cal 21 5 - 40 mg/dL   LDL Chol Calc (NIH) 50 0 - 99 mg/dL  Magnesium   Collection Time: 04/02/23 10:31 AM  Result Value Ref Range   Magnesium 1.8 1.6 - 2.3 mg/dL  TSH   Collection Time: 04/02/23 10:31 AM  Result Value Ref Range   TSH 0.294 (L) 0.450 - 4.500 uIU/mL  Vitamin B12   Collection Time: 04/02/23 10:31 AM  Result Value Ref Range   Vitamin B-12 516 232 - 1,245 pg/mL  VITAMIN D 25 Hydroxy (Vit-D Deficiency, Fractures)   Collection Time: 04/02/23 10:31 AM  Result Value Ref Range   Vit D, 25-Hydroxy 55.8 30.0 - 100.0 ng/mL  Uric acid   Collection Time: 04/02/23 10:31 AM  Result Value Ref Range   Uric Acid 3.6 3.0 - 7.2 mg/dL      Assessment & Plan:   Problem List Items Addressed This Visit       Endocrine   Type 2 diabetes mellitus with stage 3b chronic kidney disease, with long-term current use of insulin (HCC) - Primary (Chronic)   Chronic, ongoing with A1c 5.3% in January and urine ALB 18 April 2023. Recommend she check sugars 2 hours after a meal + fasting in morning -- bring to visits. Have recommended she  stop all meal time insulin (she is doing well without morning and lunch with  sugars remaining stable, she will stop dinner time) and reduce long acting to 32 units, want to avoid lows.  Educated her on insulin and to reduce [place] by 3 units if sugars consistently in 70-80 range.  Goal in long run would be to discontinue insulin -- could increase Ozempic to 2 MG next visit and further reduce insulin.  - Eye and foot exam up to date.   - Vaccines up to date.   - ACE and Statin on board.   - Return in 3 months.        Follow up plan: Return in about 2 months (around 07/08/2023) for T2DM, HTN/HLD, MOOD.

## 2023-05-13 ENCOUNTER — Encounter: Payer: Self-pay | Admitting: Nurse Practitioner

## 2023-05-15 ENCOUNTER — Ambulatory Visit: Payer: Medicare HMO | Admitting: Physical Therapy

## 2023-05-15 DIAGNOSIS — M5459 Other low back pain: Secondary | ICD-10-CM | POA: Diagnosis not present

## 2023-05-15 DIAGNOSIS — M533 Sacrococcygeal disorders, not elsewhere classified: Secondary | ICD-10-CM

## 2023-05-15 DIAGNOSIS — G8929 Other chronic pain: Secondary | ICD-10-CM | POA: Diagnosis not present

## 2023-05-15 DIAGNOSIS — R2689 Other abnormalities of gait and mobility: Secondary | ICD-10-CM

## 2023-05-15 DIAGNOSIS — M25562 Pain in left knee: Secondary | ICD-10-CM | POA: Diagnosis not present

## 2023-05-15 NOTE — Therapy (Unsigned)
 OUTPATIENT PHYSICAL THERAPY Treatment / Discharge Summary across 6 visits    Patient Name: Kara Leach MRN: [id number] DOB:[birth year], 73 y.o., female Today's Date: 05/15/2023   PT End of Session - 05/15/23 1430     Visit Number 6    Number of Visits 10    Date for PT Re-Evaluation 06/12/23    PT Start Time 1424    PT Stop Time 1502    PT Time Calculation (min) 38 min    Activity Tolerance Patient tolerated treatment well;No increased pain    Behavior During Therapy [facility] for tasks assessed/performed             Past Medical History:  Diagnosis Date   Allergy    Anxiety    Arthritis    Osteoarthritis   Cancer (HCC) 05/2018   possibly on left breast but not sure   Chronic kidney disease    Depression    Diabetes mellitus without complication (HCC) 2012   Diabetic neuropathy (HCC)    feet   Diverticulitis 2017   GERD (gastroesophageal reflux disease)    Headache    sinus and migraines on occasion   Hyperlipidemia    Hypertension    Last menstrual period (LMP) > 10 days ago 1986   Obesity    Peripheral vascular disease (HCC)    neuropathy in hands and feet d/t diabetes   Renal insufficiency    Stage 2 kidney disease   Seizures (HCC)    due to high blood sugars. last time = 09/2014   Sleep apnea    has CPAP, can't tolerate   Stroke (HCC) 1980   minor. left side remains weaker than right   Wears hearing aid    right side   Wrist weakness    left - after fracture and repair   Past Surgical History:  Procedure Laterality Date   ABDOMINAL HYSTERECTOMY  1986   APPENDECTOMY  1964   BREAST BIOPSY Left 07/03/2016   benign, fibrocystic changes with calcifications, usual ductal hyperplasia.    BREAST BIOPSY Left 04/23/2018   radial scar   BREAST BIOPSY Left 08/13/2018   Procedure: WIDE LOCALIZATION AND OPEN BIOPSY OF LEFT BREAST, DIABETIC;  Surgeon: Earline Mayotte, MD;  Location: [facility];  Service: General;  Laterality: Left;   BREAST EXCISIONAL BIOPSY  Left 08/13/2018   CHOLECYSTECTOMY  2012   COLONOSCOPY WITH PROPOFOL N/A 12/01/2015   Procedure: COLONOSCOPY WITH PROPOFOL;  Surgeon: Midge Minium, MD;  Location: [facility];  Service: Endoscopy;  Laterality: N/A;  Diabetic - insulin Sleep apnea   PATELLA FRACTURE SURGERY Left    1. had to recenter kneecap, 2 and 3 surgeries were arthroscopies   WRIST FRACTURE SURGERY Left 2005   Steel plate   Patient Active Problem List   Diagnosis Date Noted   Diabetes mellitus treated with insulin (HCC) 04/02/2023   OAB (overactive bladder) 12/20/2022   Varicose veins with inflammation 09/10/2022   Tremor of both hands 02/27/2021   Type 2 diabetes mellitus with stage 3b chronic kidney disease, with long-term current use of insulin (HCC) 05/15/2019   B12 deficiency 05/15/2019   Osteopenia of spine 10/09/2018   Gastroesophageal reflux disease without esophagitis 08/06/2018   Vitamin D deficiency 08/06/2018   Chronic gout due to renal impairment of multiple sites without tophus 08/06/2018   Depression, major, single episode, in partial remission (HCC) 05/09/2017   MGUS (monoclonal gammopathy of unknown significance) 01/04/2016   Allergic rhinitis 11/04/2014   Obesity 08/15/2014  Hyperlipidemia associated with type 2 diabetes mellitus (HCC) 08/12/2014   Hypertension associated with diabetes (HCC) 08/12/2014    PCP: [facility]   REFERRING PROVIDER:  [facility]   REFERRING DIAG: OAB  Rationale for Evaluation and Treatment Rehabilitation  THERAPY DIAG:  Other abnormalities of gait and mobility  Other low back pain  Sacrococcygeal disorders, not elsewhere classified  Chronic pain of left knee  ONSET DATE:   SUBJECTIVE:             SUBJECTIVE STATEMENT  Pt reports she had no leakage before she got to the toilet and also only went once every 2 hrs instead of once every 45 min to 1 hr.                                                                                                                 SUBJECTIVE STATEMENT  ON EVAL 04/03/23 : 1) urinary leakage, decreased sense of urge to urinate: pt is wet before she feels the urge to go urinate. Pt wears [employer] underwear all the time. Pt changes diapers 2 x day and overnight 1 x   Pt would like to be able to get to the bathroom faster, go places more because she is limited by her mobility . The medication of the bladder has helped a lot and she is not going to urinate as often .   2) L knee pain :  Pt had 4 surgeries on L knee ( 2 surgeries because her knee cap popped out of place, the other surgery was for torn ligament, one for cracking knee cap after a fall) . When she steps wrong when walking on uneven ground, stepping off step or curb, she feels the knee cap is moving out of place.  5-6/10 pain   3) LBP :  L side more pain than R, pt points to L /S junction. Nonradiating pain. 5/10 with standing or sitting for > 1 hr   PERTINENT HISTORY:    PAIN:  Are you having pain? Yes: see above   PRECAUTIONS: No   WEIGHT BEARING RESTRICTIONS: No  FALLS:  Has patient fallen in last 6 months? No  LIVING ENVIRONMENT: Lives with: younger son and dtr-in-law  Lives in: house  Stairs: 1 story, one step with rail  Has following equipment at home: sometimes use cane and walker when she is outside in the yard   OCCUPATION: retired  from [employer]   PLOF: needs dtr in Social worker to help get out of bathtub, typically she uses shower but there is no grab bar   PATIENT GOALS:   Be able to get to the bathroom faster, go places more because she is limited by her mobility     OBJECTIVE:       HOME EXERCISE PROGRAM: See pt instruction section    ASSESSMENT:  CLINICAL IMPRESSION:   Improvements overall: Pt decreased to wearing 3 diapers a day instead of changing every ohour and she is more confident about going out to public places with  more continence  Pt reports no L knee pain, it only feels achey, Pt has returned to walking her  dogs . She has walking routine which has helped her to lose 4 lbs. Pt has diabetes  and Hx of stroke  and she remains motivated to walking at the park.   Pt noticed she is going to the toilet less often since starting PT 3 visits ago.  Pelvic and shoulder alignment are levelled and has more upright posture  Pt regained more knee AROM.   Pt reports LBP is resolved and she is able to stand and sit in a car without pain     Demo'd improved gait speed 0.88 m/s ( reciporcal gait , B feet abductedd, more hip / knee flexion compared to eval:  0.68 m/ s, limited R trunk posterior rotation, R foot abducted     OBJECTIVE IMPAIRMENTS decreased activity tolerance, decreased coordination, decreased endurance, decreased mobility, difficulty walking, decreased ROM, decreased strength, decreased safety awareness, hypomobility, increased muscle spasms, impaired flexibility, improper body mechanics, postural dysfunction, and pain. scar restrictions   ACTIVITY LIMITATIONS  self-care,  sleep, home chores, work tasks    PARTICIPATION LIMITATIONS:  community activities    PERSONAL FACTORS        are also affecting patient's functional outcome.    REHAB POTENTIAL: Good   CLINICAL DECISION MAKING: Evolving/moderate complexity   EVALUATION COMPLEXITY: Moderate    PATIENT EDUCATION:    Education details: Showed pt anatomy images. Explained muscles attachments/ connection, physiology of deep core system/ spinal- thoracic-pelvis-lower kinetic chain as they relate to pt's presentation, Sx, and past Hx. Explained what and how these areas of deficits need to be restored to balance and function    See Therapeutic activity / neuromuscular re-education section  Answered pt's questions.   Person educated: Patient Education method: Explanation, Demonstration, Tactile cues, Verbal cues, and Handouts Education comprehension: verbalized understanding, returned demonstration, verbal cues required, tactile cues required,  and needs further education     PLAN: PT FREQUENCY: 1x/week   PT DURATION: 10 weeks   PLANNED INTERVENTIONS: Therapeutic exercises, Therapeutic activity, Neuromuscular re-education, Balance training, Gait training, Patient/Family education, Self Care, Joint mobilization, Spinal mobilization, Moist heat, Taping, and Manual therapy, dry needling.   PLAN FOR NEXT SESSION: See clinical impression for plan     GOALS: Goals reviewed with patient? Yes  SHORT TERM GOALS: Target date: 05/01/2023    Pt will demo IND with HEP                    Baseline: Not IND            Goal status: INITIAL   LONG TERM GOALS: Target date: 06/12/2023    1.Pt will demo proper deep core coordination without chest breathing and optimal excursion of diaphragm/pelvic floor in order to promote spinal stability and pelvic floor function  Baseline: dyscoordination Goal status: MET   2.  Pt will demo proper body mechanics in against gravity tasks and ADLs  work tasks, fitness  to minimize straining pelvic floor / back    Baseline: not IND, improper form that places strain on pelvic floor  Goal status:  MET    4. Pt will demo increased gait speed > 1.0 m/s with reciprocal gait pattern, longer stride length  in order to ambulate safely in community and return to fitness routine  Baseline:  0.68 m/ s, limited R trunk posterior rotation, R foot abducted , Goal status:  Partially met  (  04/24/23: 4th visit 0.88 m/s ( reciporcal gait , B feet abducted, more hip / knee flexion )  ( 05/15/23: 0.86 m/s)     5. Pt will change < 1 diaper per day in order to be able to go to more public places  Baseline:  Pt changes [employer] underwear diapers hourly  and  and overnight 1 x and scared to go out to public places)  Goal status: partially met  ( 05/15/23:  3 diapers per day,  more confident to go to public places)     6. Pt will report no more knee pain on L when walking on uneven ground, stepping off step or curb in  order to minimize falls  Baseline: When she steps wrong when walking on uneven ground, stepping off step or curb, she feels the knee cap is moving out of place.  5-6/10 pain  Goal status: MET ( 05/15/23:  just achey)     7.  Pt will report decreased pain < 2/10 with  standing or sitting in car for > 1 hr in order to travel   Baseline: LBP 5/10 with standing or sitting in car for > 1 hr  Goal status: MET ( 04/24/23:     Mariane Masters, PT 05/15/2023, 2:31 PM

## 2023-05-22 ENCOUNTER — Ambulatory Visit: Payer: Medicare HMO | Admitting: Physical Therapy

## 2023-05-28 ENCOUNTER — Other Ambulatory Visit: Payer: Self-pay | Admitting: Nurse Practitioner

## 2023-05-28 DIAGNOSIS — Z1231 Encounter for screening mammogram for malignant neoplasm of breast: Secondary | ICD-10-CM

## 2023-05-29 ENCOUNTER — Encounter: Payer: Medicare HMO | Admitting: Physical Therapy

## 2023-07-07 NOTE — Patient Instructions (Incomplete)
 Stop Dinner time insulin  and increase Ozempic  to 2 MG dosing weekly.  We may go down on Tresiba  next visit.  Be Involved in Caring For Your Health:  Taking Medications When medications are taken as directed, they can greatly improve your health. But if they are not taken as prescribed, they may not work. In some cases, not taking them correctly can be harmful. To help ensure your treatment remains effective and safe, understand your medications and how to take them. Bring your medications to each visit for review by your provider.  Your lab results, notes, and after visit summary will be available on My Chart. We strongly encourage you to use this feature. If lab results are abnormal the clinic will contact you with the appropriate steps. If the clinic does not contact you assume the results are satisfactory. You can always view your results on My Chart. If you have questions regarding your health or results, please contact the clinic during office hours. You can also ask questions on My Chart.  We at [facility] are grateful that you chose us  to provide your care. We strive to provide evidence-based and compassionate care and are always looking for feedback. If you get a survey from the clinic please complete this so we can hear your opinions.  Diabetes Mellitus and Exercise Regular exercise is important for your health, especially if you have diabetes mellitus. Exercise is not just about losing weight. It can also help you increase muscle strength and bone density and reduce body fat and stress. This can help your level of endurance and make you more fit and flexible. Why should I exercise if I have diabetes? Exercise has many benefits for people with diabetes. It can: Help lower and control your blood sugar (glucose). Help your body respond better and become more sensitive to the hormone insulin . Reduce how much insulin  your body needs. Lower your risk for heart disease  by: Lowering how much "bad" cholesterol and triglycerides you have in your body. Increasing how much "good" cholesterol you have in your body. Lowering your blood pressure. Lowering your blood glucose levels. What is my activity plan? Your health care provider or an expert trained in diabetes care (certified diabetes educator) can help you make an activity plan. This plan can help you find the type of exercise that works for you. It may also tell you how often to exercise and for how long. Be sure to: Get at least 150 minutes of medium-intensity or high-intensity exercise each week. This may involve brisk walking, biking, or water  aerobics. Do stretching and strengthening exercises at least 2 times a week. This may involve yoga or weight lifting. Spread out your activity over at least 3 days of the week. Get some form of physical activity each day. Do not go more than 2 days in a row without some kind of activity. Avoid being inactive for more than 30 minutes at a time. Take frequent breaks to walk or stretch. Choose activities that you enjoy. Set goals that you know you can accomplish. Start slowly and increase the intensity of your exercise over time. How do I manage my diabetes during exercise?  Monitor your blood glucose Check your blood glucose before and after you exercise. If your blood glucose is 240 mg/dL (40.9 mmol/L) or higher before you exercise, check your urine for ketones. These are chemicals created by the liver. If you have ketones in your urine, do not exercise until your blood glucose returns to  normal. If your blood glucose is 100 mg/dL (5.6 mmol/L) or lower, eat a snack that has 15-20 grams of carbohydrate in it. Check your blood glucose 15 minutes after the snack to make sure that your level is above 100 mg/dL (5.6 mmol/L) before you start to exercise. Your risk for low blood glucose (hypoglycemia) goes up during and after exercise. Know the symptoms of this condition and  how to treat it. Follow these instructions at home: Keep a carbohydrate snack on hand for use before, during, and after exercise. This can help prevent or treat hypoglycemia. Avoid injecting insulin  into parts of your body that are going to be used during exercise. This may include: Your arms, when you are going to play tennis. Your legs, when you are about to go jogging. Keep track of your exercise habits. This can help you and your health care provider watch and adjust your activity plan. Write down: What you eat before and after you exercise. Blood glucose levels before and after you exercise. The type and amount of exercise you do. Talk to your health care provider before you start a new activity. They may need to: Make sure that the activity is safe for you. Adjust your insulin , other medicines, and food that you eat. Drink water  while you exercise. This can stop you from losing too much water  (dehydration). It can also prevent problems caused by having a lot of heat in your body (heat stroke). Where to find more information American Diabetes Association: diabetes.org Association of Diabetes Care & Education Specialists: diabeteseducator.org This information is not intended to replace advice given to you by your health care provider. Make sure you discuss any questions you have with your health care provider. Document Revised: 08/23/2021 Document Reviewed: 08/23/2021 Elsevier Patient Education  2024 [employer].

## 2023-07-09 ENCOUNTER — Other Ambulatory Visit: Payer: Self-pay | Admitting: Nurse Practitioner

## 2023-07-09 NOTE — Telephone Encounter (Signed)
 Requested Prescriptions  Pending Prescriptions Disp Refills   insulin  degludec (TRESIBA  FLEXTOUCH) 100 UNIT/ML FlexTouch Pen [Pharmacy Med Name: TRESIBA  FLEXTOUCH 100 UNIT/ML] 9 mL 1    Sig: INJECT 30 UNITS INTO THE SKIN AT BEDTIME.     Endocrinology:  Diabetes - Insulins Passed - 07/09/2023  5:32 PM      Passed - HBA1C is between 0 and 7.9 and within 180 days    Hemoglobin A1C  Date Value Ref Range Status  11/12/2013 13.4 (H) 4.2 - 6.3 % Final    Comment:    The American Diabetes Association recommends that a primary goal of therapy should be <7% and that physicians should reevaluate the treatment regimen in patients with HbA1c values consistently >8%.    HB A1C (BAYER DCA - WAIVED)  Date Value Ref Range Status  04/02/2023 5.3 4.8 - 5.6 % Final    Comment:             Prediabetes: 5.7 - 6.4          Diabetes: >6.4          Glycemic control for adults with diabetes: <7.0          Passed - Valid encounter within last 6 months    Recent Outpatient Visits           2 months ago Type 2 diabetes mellitus with stage 3b chronic kidney disease, with long-term current use of insulin  (HCC)   [facility] [place], Lavelle Posey, NP       Future Appointments             In 2 days Cannady, Jolene T, NP [facility]

## 2023-07-11 ENCOUNTER — Encounter: Payer: Self-pay | Admitting: Nurse Practitioner

## 2023-07-11 ENCOUNTER — Ambulatory Visit: Payer: Medicare HMO | Admitting: Nurse Practitioner

## 2023-07-11 VITALS — BP 112/63 | HR 68 | Temp 97.9°F | Ht 60.0 in | Wt 139.8 lb

## 2023-07-11 DIAGNOSIS — E1159 Type 2 diabetes mellitus with other circulatory complications: Secondary | ICD-10-CM

## 2023-07-11 DIAGNOSIS — Z794 Long term (current) use of insulin: Secondary | ICD-10-CM

## 2023-07-11 DIAGNOSIS — E1122 Type 2 diabetes mellitus with diabetic chronic kidney disease: Secondary | ICD-10-CM | POA: Diagnosis not present

## 2023-07-11 DIAGNOSIS — E785 Hyperlipidemia, unspecified: Secondary | ICD-10-CM | POA: Diagnosis not present

## 2023-07-11 DIAGNOSIS — D472 Monoclonal gammopathy: Secondary | ICD-10-CM

## 2023-07-11 DIAGNOSIS — I152 Hypertension secondary to endocrine disorders: Secondary | ICD-10-CM

## 2023-07-11 DIAGNOSIS — N1832 Chronic kidney disease, stage 3b: Secondary | ICD-10-CM | POA: Diagnosis not present

## 2023-07-11 DIAGNOSIS — E119 Type 2 diabetes mellitus without complications: Secondary | ICD-10-CM | POA: Diagnosis not present

## 2023-07-11 DIAGNOSIS — F324 Major depressive disorder, single episode, in partial remission: Secondary | ICD-10-CM | POA: Diagnosis not present

## 2023-07-11 DIAGNOSIS — E66811 Obesity, class 1: Secondary | ICD-10-CM | POA: Diagnosis not present

## 2023-07-11 DIAGNOSIS — E1169 Type 2 diabetes mellitus with other specified complication: Secondary | ICD-10-CM

## 2023-07-11 DIAGNOSIS — R7989 Other specified abnormal findings of blood chemistry: Secondary | ICD-10-CM | POA: Diagnosis not present

## 2023-07-11 LAB — BAYER DCA HB A1C WAIVED: HB A1C (BAYER DCA - WAIVED): 5.6 % (ref 4.8–5.6)

## 2023-07-11 MED ORDER — SEMAGLUTIDE (2 MG/DOSE) 8 MG/3ML ~~LOC~~ SOPN: 2.0000 mg | PEN_INJECTOR | SUBCUTANEOUS | 12 refills | Status: DC

## 2023-07-11 NOTE — Assessment & Plan Note (Signed)
 Chronic, ongoing with A1c 5.6% today and urine ALB 18 April 2023. Recommend she check sugars 2 hours after a meal + fasting in morning, bring to visits. Have recommended she stop all meal time insulin , including dinner.  Increase Ozempic  to 2 MG weekly and continue Farxiga.  Maintain Tresiba  at current dose, but reduce by 3 units if sugars consistent below 70.  Goal is is to discontinue insulin  in long run.  Educated her on insulin  and to reduce Tresiba  by 3 units if sugars consistently in 70-80 range.   - Eye and foot exam up to date.   - Vaccines up to date.   - ACE and Statin on board.   - Return in 3 months.

## 2023-07-11 NOTE — Assessment & Plan Note (Signed)
 Chronic, ongoing.  Continue current medication regimen and adjust as needed. Lipid panel today.

## 2023-07-11 NOTE — Assessment & Plan Note (Signed)
Chronic, stable.  Denies SI/HI.  Continue current medication regimen, Sertraline 100 MG daily, and adjust as needed. Recommend meditation and relaxation techniques at home. 

## 2023-07-11 NOTE — Assessment & Plan Note (Signed)
 Refer to diabetes with CKD plan of care.

## 2023-07-11 NOTE — Assessment & Plan Note (Signed)
 Chronic, stable.  BP remains at goal in office and at home for her age.  Continue current medication regimen and adjust as needed.  Alert provider if BP consistently >130/80 at home and then may restart Amlodipine  at 2.5 MG.  Continue Metoprolol  and Enalapril , adjust as needed.  Focus on DASH diet.  Enalapril  for kidney protection.  LABS: CMP.

## 2023-07-11 NOTE — Assessment & Plan Note (Signed)
 BMI 27.30, is losing weight with Ozempic  on board -- 8 lbs since January.  Recommended eating smaller high protein, low fat meals more frequently and exercising 30 mins a day 5 times a week with a goal of 10-15lb weight loss in the next 3 months. Patient voiced their understanding and motivation to adhere to these recommendations.

## 2023-07-11 NOTE — Progress Notes (Signed)
 BP 112/63   Pulse 68   Temp 97.9 F (36.6 C) (Oral)   Ht 5' (1.524 m)   Wt 139 lb 12.8 oz (63.4 kg)   LMP  (LMP Unknown)   SpO2 98%   BMI 27.30 kg/m    Subjective:    Patient ID: Kara Leach, female    DOB: [birth year], 73 y.o.   MRN: [id number]  HPI: Kara Leach is a 73 y.o. female  Chief Complaint  Patient presents with   Depression   Diabetes   Hyperlipidemia   Hypertension   DIABETES A1c 5.3% in October.  Continues on Novolog  at dinner time only (we cut out morning and afternoon last visit), Tresiba , Ozempic , Elvina Hammers (works with PharmD on assistance with this).   Hypoglycemic episodes: occasional Polydipsia/polyuria: no Visual disturbance: no Chest pain: no Paresthesias: no Glucose Monitoring: yes             Accucheck frequency: BID -- 97 this morning             Fasting glucose: 80 to 100 on average             Post prandial:             Evening: 100 to 130 range             Before meals: Taking Insulin ?: yes             Long acting insulin : Tresiba  30 units             Short acting insulin : Novolog  14 at dinner Blood Pressure Monitoring: daily Retinal Examination: Up To Date Foot Exam: Up to Date Pneumovax: Up to Date Influenza: Up to Date Aspirin : yes    HYPERTENSION / HYPERLIPIDEMIA Continues Enalapril , Metoprolol , ASA + Simvastatin .  Satisfied with current treatment? yes Duration of hypertension: chronic BP monitoring frequency: daily BP range: <130/80 on average  BP medication side effects: no Duration of hyperlipidemia: chronic Cholesterol medication side effects: no Cholesterol supplements: none Medication compliance: good compliance Aspirin : yes Recent stressors: no Recurrent headaches: no Visual changes: no Palpitations: no Dyspnea: no Chest pain: no Lower extremity edema: no Dizzy/lightheaded: no The ASCVD Risk score (Arnett DK, et al., 2019) failed to calculate for the following reasons:   Risk score cannot be calculated  because patient has a medical history suggesting prior/existing ASCVD   CHRONIC KIDNEY DISEASE Last nephrology visit was 12/06/22. Labs CRT 1.09 and eGFR 55. CKD status: stable Medications renally dose: yes Previous renal evaluation: no Pneumovax:  Up to Date Influenza Vaccine:  Up to Date   MGUS: Last oncology visit was 01/28/23. To continue yearly visits and labs with oncology. In past followed with Dr. Marquita Situ after her mammograms for history of biopsies, last saw 05/05/20, he has retired.  Low TSH level noted on past labs, no symptoms.  DEPRESSION Takes Sertraline  100 MG.  This month is month her husband passed in, so more difficult month.  Mood status: stable Satisfied with current treatment?: yes Symptom severity: moderate  Duration of current treatment : chronic Side effects: no Medication compliance: good compliance Psychotherapy/counseling: none Previous psychiatric medications: Zoloft  Depressed mood: occasional -- has days Anxious mood: occasional -- has days Anhedonia: no Significant weight loss or gain: no Insomnia: no Fatigue: no Feelings of worthlessness or guilt: no Impaired concentration/indecisiveness: yes Suicidal ideations: no Hopelessness: no Crying spells: no    07/11/2023    8:52 AM 05/10/2023   10:43 AM 04/02/2023   10:29 AM  01/17/2023   11:06 AM 12/20/2022   10:48 AM  Depression screen PHQ 2/9  Decreased Interest 1 1 1 1 1   Down, Depressed, Hopeless 1 1 1 1 1   PHQ - 2 Score 2 2 2 2 2   Altered sleeping 1 1 1 1 1   Tired, decreased energy 1 1 1 1 1   Change in appetite 0 0 0 0 1  Feeling bad or failure about yourself  0 1 0 1 0  Trouble concentrating 0 0 0 0 0  Moving slowly or fidgety/restless 0 0 0 0 0  Suicidal thoughts 0 0 0 0 0  PHQ-9 Score 4 5 4 5 5   Difficult doing work/chores Somewhat difficult Somewhat difficult Somewhat difficult  Somewhat difficult       07/11/2023    8:52 AM 05/10/2023   10:44 AM 04/02/2023   10:29 AM 01/17/2023    11:06 AM  GAD 7 : Generalized Anxiety Score  Nervous, Anxious, on Edge 1 1 1 1   Control/stop worrying 0 1 1 0  Worry too much - different things 0 0 1 0  Trouble relaxing 0 0 1 1  Restless 0 0 0 0  Easily annoyed or irritable 0 0 0 0  Afraid - awful might happen 1 1 1  0  Total GAD 7 Score 2 3 5 2   Anxiety Difficulty Somewhat difficult Somewhat difficult Somewhat difficult    Relevant past medical, surgical, family and social history reviewed and updated as indicated. Interim medical history since our last visit reviewed. Allergies and medications reviewed and updated.  Review of Systems  Constitutional:  Negative for activity change, appetite change, diaphoresis, fatigue and fever.  Respiratory:  Negative for cough, chest tightness and shortness of breath.   Cardiovascular:  Negative for chest pain, palpitations and leg swelling.  Gastrointestinal: Negative.   Endocrine: Negative for cold intolerance, heat intolerance, polydipsia, polyphagia and polyuria.  Neurological: Negative.   Psychiatric/Behavioral:  Negative for decreased concentration, self-injury, sleep disturbance and suicidal ideas. The patient is nervous/anxious.     Per HPI unless specifically indicated above     Objective:    BP 112/63   Pulse 68   Temp 97.9 F (36.6 C) (Oral)   Ht 5' (1.524 m)   Wt 139 lb 12.8 oz (63.4 kg)   LMP  (LMP Unknown)   SpO2 98%   BMI 27.30 kg/m   Wt Readings from Last 3 Encounters:  07/11/23 139 lb 12.8 oz (63.4 kg)  05/10/23 143 lb 9.6 oz (65.1 kg)  04/02/23 147 lb 9.6 oz (67 kg)    Physical Exam Vitals and nursing note reviewed.  Constitutional:      General: She is awake. She is not in acute distress.    Appearance: She is well-developed and well-groomed. She is obese. She is not ill-appearing or toxic-appearing.  HENT:     Head: Normocephalic.     Right Ear: Hearing and external ear normal.     Left Ear: Hearing and external ear normal.  Eyes:     General: Lids are  normal.        Right eye: No discharge.        Left eye: No discharge.     Conjunctiva/sclera: Conjunctivae normal.     Pupils: Pupils are equal, round, and reactive to light.  Neck:     Thyroid : No thyromegaly.     Vascular: No carotid bruit.  Cardiovascular:     Rate and Rhythm: Normal rate and regular  rhythm.     Heart sounds: Normal heart sounds. No murmur heard.    No gallop.  Pulmonary:     Effort: Pulmonary effort is normal. No accessory muscle usage or respiratory distress.     Breath sounds: Normal breath sounds.  Abdominal:     General: Bowel sounds are normal. There is no distension.     Palpations: Abdomen is soft.     Tenderness: There is no abdominal tenderness.  Musculoskeletal:     Cervical back: Normal range of motion and neck supple.     Right lower leg: No edema.     Left lower leg: No edema.  Lymphadenopathy:     Cervical: No cervical adenopathy.  Skin:    General: Skin is warm and dry.  Neurological:     Mental Status: She is alert and oriented to person, place, and time.     Deep Tendon Reflexes: Reflexes are normal and symmetric.     Reflex Scores:      Brachioradialis reflexes are 2+ on the right side and 2+ on the left side.      Patellar reflexes are 2+ on the right side and 2+ on the left side. Psychiatric:        Attention and Perception: Attention normal.        Mood and Affect: Mood normal.        Speech: Speech normal.        Behavior: Behavior normal. Behavior is cooperative.        Thought Content: Thought content normal.    Results for orders placed or performed in visit on 05/13/23  HM DIABETES EYE EXAM   Collection Time: 04/22/23 10:04 AM  Result Value Ref Range   HM Diabetic Eye Exam No Retinopathy No Retinopathy      Assessment & Plan:   Problem List Items Addressed This Visit       Cardiovascular and Mediastinum   Hypertension associated with diabetes (HCC) (Chronic)   Chronic, stable.  BP remains at goal in office and at  home for her age.  Continue current medication regimen and adjust as needed.  Alert provider if BP consistently >130/80 at home and then may restart Amlodipine  at 2.5 MG.  Continue Metoprolol  and Enalapril , adjust as needed.  Focus on DASH diet.  Enalapril  for kidney protection.  LABS: CMP.      Relevant Medications   Semaglutide , 2 MG/DOSE, 8 MG/3ML SOPN   Other Relevant Orders   Bayer DCA Hb A1c Waived   Comprehensive metabolic panel with GFR     Endocrine   Type 2 diabetes mellitus with stage 3b chronic kidney disease, with long-term current use of insulin  (HCC) - Primary (Chronic)   Chronic, ongoing with A1c 5.6% today and urine ALB 18 April 2023. Recommend she check sugars 2 hours after a meal + fasting in morning, bring to visits. Have recommended she stop all meal time insulin , including dinner.  Increase Ozempic  to 2 MG weekly and continue Farxiga.  Maintain Tresiba  at current dose, but reduce by 3 units if sugars consistent below 70.  Goal is is to discontinue insulin  in long run.  Educated her on insulin  and to reduce Tresiba  by 3 units if sugars consistently in 70-80 range.   - Eye and foot exam up to date.   - Vaccines up to date.   - ACE and Statin on board.   - Return in 3 months.      Relevant Medications  Semaglutide , 2 MG/DOSE, 8 MG/3ML SOPN   Other Relevant Orders   Bayer DCA Hb A1c Waived   Hyperlipidemia associated with type 2 diabetes mellitus (HCC) (Chronic)   Chronic, ongoing.  Continue current medication regimen and adjust as needed.  Lipid panel today.      Relevant Medications   Semaglutide , 2 MG/DOSE, 8 MG/3ML SOPN   Other Relevant Orders   Bayer DCA Hb A1c Waived   Comprehensive metabolic panel with GFR   Lipid Panel w/o Chol/HDL Ratio   Diabetes mellitus treated with insulin  (HCC)   Refer to diabetes with CKD plan of care.      Relevant Medications   Semaglutide , 2 MG/DOSE, 8 MG/3ML SOPN   Other Relevant Orders   Bayer DCA Hb A1c Waived      Other   Obesity (Chronic)   BMI 27.30, is losing weight with Ozempic  on board -- 8 lbs since January.  Recommended eating smaller high protein, low fat meals more frequently and exercising 30 mins a day 5 times a week with a goal of 10-15lb weight loss in the next 3 months. Patient voiced their understanding and motivation to adhere to these recommendations.       Relevant Medications   Semaglutide , 2 MG/DOSE, 8 MG/3ML SOPN   MGUS (monoclonal gammopathy of unknown significance) (Chronic)   Chronic, stable.  Continue collaboration with Dr. Adrian Alba at [facility] - recent notes and labs reviewed.      Depression, major, single episode, in partial remission (HCC)   Chronic, stable.  Denies SI/HI.  Continue current medication regimen, Sertraline  100 MG daily, and adjust as needed. Recommend meditation and relaxation techniques at home.      Other Visit Diagnoses       Low thyroid  stimulating hormone (TSH) level       Recheck levels today.   Relevant Orders   TSH   T4, free        Follow up plan: Return for T2DM -- increased Ozempic  to 2 MG and got rid of dinner insulin .

## 2023-07-11 NOTE — Assessment & Plan Note (Signed)
Chronic, stable.  Continue collaboration with Dr. Finnegan at [facility] - recent notes and labs reviewed. 

## 2023-07-12 LAB — T4, FREE: Free T4: 1.19 ng/dL (ref 0.82–1.77)

## 2023-07-12 LAB — COMPREHENSIVE METABOLIC PANEL WITH GFR
ALT: 13 IU/L (ref 0–32)
AST: 24 IU/L (ref 0–40)
Albumin: 4.3 g/dL (ref 3.9–4.9)
Alkaline Phosphatase: 122 IU/L — ABNORMAL HIGH (ref 44–121)
BUN/Creatinine Ratio: 13 (ref 12–28)
BUN: 16 mg/dL (ref 8–27)
Bilirubin Total: 0.3 mg/dL (ref 0.0–1.2)
CO2: 22 mmol/L (ref 20–29)
Calcium: 9.5 mg/dL (ref 8.7–10.3)
Chloride: 104 mmol/L (ref 96–106)
Creatinine, Ser: 1.25 mg/dL — ABNORMAL HIGH (ref 0.57–1.00)
Globulin, Total: 2.7 g/dL (ref 1.5–4.5)
Glucose: 82 mg/dL (ref 70–99)
Potassium: 4 mmol/L (ref 3.5–5.2)
Sodium: 142 mmol/L (ref 134–144)
Total Protein: 7 g/dL (ref 6.0–8.5)
eGFR: 46 mL/min/{1.73_m2} — ABNORMAL LOW (ref >59)

## 2023-07-12 LAB — TSH: TSH: 0.862 u[IU]/mL (ref 0.450–4.500)

## 2023-07-12 LAB — LIPID PANEL W/O CHOL/HDL RATIO
Cholesterol, Total: 115 mg/dL (ref 100–199)
HDL: 40 mg/dL (ref >39)
LDL Chol Calc (NIH): 53 mg/dL (ref 0–99)
Triglycerides: 120 mg/dL (ref 0–149)
VLDL Cholesterol Cal: 22 mg/dL (ref 5–40)

## 2023-07-12 NOTE — Progress Notes (Signed)
 Good afternoon, please let Kara Leach know her labs have returned.  Kidney function continues to show Stage 3a kidney disease with no worsening.  Remainder of labs are stable and further medication changes needed.  Have a great day!!

## 2023-07-31 ENCOUNTER — Ambulatory Visit
Admission: RE | Admit: 2023-07-31 | Discharge: 2023-07-31 | Disposition: A | Discharge status: Home / Self Care | Source: Ambulatory Visit | Attending: Nurse Practitioner | Admitting: Nurse Practitioner

## 2023-07-31 DIAGNOSIS — Z1231 Encounter for screening mammogram for malignant neoplasm of breast: Secondary | ICD-10-CM | POA: Insufficient documentation

## 2023-08-11 NOTE — Patient Instructions (Signed)
 Be Involved in Caring For Your Health:  Taking Medications When medications are taken as directed, they can greatly improve your health. But if they are not taken as prescribed, they may not work. In some cases, not taking them correctly can be harmful. To help ensure your treatment remains effective and safe, understand your medications and how to take them. Bring your medications to each visit for review by your provider.  Your lab results, notes, and after visit summary will be available on My Chart. We strongly encourage you to use this feature. If lab results are abnormal the clinic will contact you with the appropriate steps. If the clinic does not contact you assume the results are satisfactory. You can always view your results on My Chart. If you have questions regarding your health or results, please contact the clinic during office hours. You can also ask questions on My Chart.  We at [facility] are grateful that you chose [place] to provide your care. We strive to provide evidence-based and compassionate care and are always looking for feedback. If you get a survey from the clinic please complete this so we can hear your opinions.  Diabetes Mellitus and Foot Care Diabetes, also called diabetes mellitus, may cause problems with your feet and legs because of poor blood flow (circulation). Poor circulation may make your skin: Become thinner and drier. Break more easily. Heal more slowly. Peel and crack. You may also have nerve damage (neuropathy). This can cause decreased feeling in your legs and feet. This means that you may not notice minor injuries to your feet that could lead to more serious problems. Finding and treating problems early is the best way to prevent future foot problems. How to care for your feet Foot hygiene  Wash your feet daily with warm water and mild soap. Do not use hot water. Then, pat your feet and the areas between your toes until they are fully dry. Do  not soak your feet. This can dry your skin. Trim your toenails straight across. Do not dig under them or around the cuticle. File the edges of your nails with an emery board or nail file. Apply a moisturizing lotion or petroleum jelly to the skin on your feet and to dry, brittle toenails. Use lotion that does not contain alcohol and is unscented. Do not apply lotion between your toes. Shoes and socks Wear clean socks or stockings every day. Make sure they are not too tight. Do not wear knee-high stockings. These may decrease blood flow to your legs. Wear shoes that fit well and have enough cushioning. Always look in your shoes before you put them on to be sure there are no objects inside. To break in new shoes, wear them for just a few hours a day. This prevents injuries on your feet. Wounds, scrapes, corns, and calluses  Check your feet daily for blisters, cuts, bruises, sores, and redness. If you cannot see the bottom of your feet, use a mirror or ask someone for help. Do not cut off corns or calluses or try to remove them with medicine. If you find a minor scrape, cut, or break in the skin on your feet, keep it and the skin around it clean and dry. You may clean these areas with mild soap and water. Do not clean the area with peroxide, alcohol, or iodine. If you have a wound, scrape, corn, or callus on your foot, look at it several times a day to make sure it  is healing and not infected. Check for: Redness, swelling, or pain. Fluid or blood. Warmth. Pus or a bad smell. General tips Do not cross your legs. This may decrease blood flow to your feet. Do not use heating pads or hot water bottles on your feet. They may burn your skin. If you have lost feeling in your feet or legs, you may not know this is happening until it is too late. Protect your feet from hot and cold by wearing shoes, such as at the beach or on hot pavement. Schedule a complete foot exam at least once a year or more often if  you have foot problems. Report any cuts, sores, or bruises to your health care provider right away. Where to find more information American Diabetes Association: diabetes.org Association of Diabetes Care & Education Specialists: diabeteseducator.org Contact a health care provider if: You have a condition that increases your risk of infection, and you have any cuts, sores, or bruises on your feet. You have an injury that is not healing. You have redness on your legs or feet. You feel burning or tingling in your legs or feet. You have pain or cramps in your legs and feet. Your legs or feet are numb. Your feet always feel cold. You have pain around any toenails. Get help right away if: You have a wound, scrape, corn, or callus on your foot and: You have signs of infection. You have a fever. You have a red line going up your leg. This information is not intended to replace advice given to you by your health care provider. Make sure you discuss any questions you have with your health care provider. Document Revised: 09/06/2021 Document Reviewed: 09/06/2021 Elsevier Patient Education  2024 [employer].

## 2023-08-15 ENCOUNTER — Ambulatory Visit: Admitting: Nurse Practitioner

## 2023-08-15 ENCOUNTER — Encounter: Payer: Self-pay | Admitting: Nurse Practitioner

## 2023-08-15 VITALS — BP 129/71 | HR 90 | Temp 97.9°F | Ht 60.0 in | Wt 135.0 lb

## 2023-08-15 DIAGNOSIS — E119 Type 2 diabetes mellitus without complications: Secondary | ICD-10-CM

## 2023-08-15 DIAGNOSIS — Z794 Long term (current) use of insulin: Secondary | ICD-10-CM

## 2023-08-15 DIAGNOSIS — N1832 Chronic kidney disease, stage 3b: Secondary | ICD-10-CM | POA: Diagnosis not present

## 2023-08-15 DIAGNOSIS — E1122 Type 2 diabetes mellitus with diabetic chronic kidney disease: Secondary | ICD-10-CM

## 2023-08-15 MED ORDER — SEMAGLUTIDE (1 MG/DOSE) 4 MG/3ML ~~LOC~~ SOPN
1.0000 mg | PEN_INJECTOR | SUBCUTANEOUS | 4 refills | Status: AC
Start: 2023-08-15 — End: ?

## 2023-08-15 MED ORDER — BD PEN NEEDLE NANO U/F 32G X 4 MM MISC: 12 refills | Status: AC

## 2023-08-15 NOTE — Assessment & Plan Note (Signed)
 Refer to diabetes with CKD plan of care.

## 2023-08-15 NOTE — Assessment & Plan Note (Signed)
 Chronic, ongoing with A1c 5.6% in April and urine ALB 18 April 2023. Recommend she check sugars 2 hours after a meal + fasting in morning, bring to visits. Have recommended she stop all meal time insulin , including dinner -- educated her that if BS is <180 she does not need to take meal time insulin  + should not take after meals.  We discussed at length. Return to Ozempic  1 MG weekly and continue Farxiga.  Maintain Tresiba  at current dose, but reduce by 3 units every 3 days if morning BS <130.  Goal is to discontinue insulin  in long run.  - Eye and foot exam up to date.   - Vaccines up to date.   - ACE and Statin on board.

## 2023-08-15 NOTE — Progress Notes (Signed)
 BP 129/71   Pulse 90   Temp 97.9 F (36.6 C) (Oral)   Ht 5' (1.524 m)   Wt 135 lb (61.2 kg)   LMP  (LMP Unknown)   SpO2 96%   BMI 26.37 kg/m    Subjective:    Patient ID: Kara Leach, female    DOB: 11-11-1950, 73 y.o.   MRN: [id number]  HPI: Kara Leach is a 73 y.o. female  Chief Complaint  Patient presents with   Diabetes    Patient states she could not tolerate the 2 mg Ozempic . States she took 2 doses and then stopped. States it was giving her diarrhea, nausea, and dizziness. States she went back to taking her 1 mg dose.    DIABETES Follow-up today for medication changes on 07/11/23.  Increased Ozempic  to 2 MG and continued to reduce insulin  dosing.  The 2 MG dosing was not tolerated, caused diarrhea and vomiting.   Hypoglycemic episodes:no Polydipsia/polyuria: no Visual disturbance: no Chest pain: no Paresthesias: no Glucose Monitoring: yes  Accucheck frequency: twice a day  Fasting glucose: 88 this morning, often under 100  Post prandial: after supper 150 or lower  Evening:  Before meals: Taking Insulin ?: yes  Long acting insulin :Tresiba  30 units  Short acting insulin : has taken only a few times if sugar 150 after dinner Blood Pressure Monitoring: daily Retinal Examination: Up to Date Foot Exam: Up to Date Diabetic Education: Completed Pneumovax: Up to Date Influenza: Up to Date Aspirin : yes   Relevant past medical, surgical, family and social history reviewed and updated as indicated. Interim medical history since our last visit reviewed. Allergies and medications reviewed and updated.  Review of Systems  Constitutional:  Negative for activity change, appetite change, diaphoresis, fatigue and fever.  Respiratory:  Negative for cough, chest tightness and shortness of breath.   Cardiovascular:  Negative for chest pain, palpitations and leg swelling.  Gastrointestinal: Negative.   Endocrine: Negative for cold intolerance, heat intolerance,  polydipsia, polyphagia and polyuria.  Neurological: Negative.   Psychiatric/Behavioral: Negative.      Per HPI unless specifically indicated above     Objective:     BP 129/71   Pulse 90   Temp 97.9 F (36.6 C) (Oral)   Ht 5' (1.524 m)   Wt 135 lb (61.2 kg)   LMP  (LMP Unknown)   SpO2 96%   BMI 26.37 kg/m   Wt Readings from Last 3 Encounters:  08/15/23 135 lb (61.2 kg)  07/11/23 139 lb 12.8 oz (63.4 kg)  05/10/23 143 lb 9.6 oz (65.1 kg)    Physical Exam Vitals and nursing note reviewed.  Constitutional:      General: She is awake. She is not in acute distress.    Appearance: She is well-developed and well-groomed. She is obese. She is not ill-appearing or toxic-appearing.  HENT:     Head: Normocephalic.     Right Ear: Hearing and external ear normal.     Left Ear: Hearing and external ear normal.  Eyes:     General: Lids are normal.        Right eye: No discharge.        Left eye: No discharge.     Conjunctiva/sclera: Conjunctivae normal.     Pupils: Pupils are equal, round, and reactive to light.  Neck:     Thyroid : No thyromegaly.     Vascular: No carotid bruit.  Cardiovascular:     Rate and Rhythm: Normal rate and  regular rhythm.     Heart sounds: Normal heart sounds. No murmur heard.    No gallop.  Pulmonary:     Effort: Pulmonary effort is normal. No accessory muscle usage or respiratory distress.     Breath sounds: Normal breath sounds.  Abdominal:     General: Bowel sounds are normal. There is no distension.     Palpations: Abdomen is soft.     Tenderness: There is no abdominal tenderness.  Musculoskeletal:     Cervical back: Normal range of motion and neck supple.     Right lower leg: No edema.     Left lower leg: No edema.  Lymphadenopathy:     Cervical: No cervical adenopathy.  Skin:    General: Skin is warm and dry.  Neurological:     Mental Status: She is alert and oriented to person, place, and time.     Deep Tendon Reflexes: Reflexes are  normal and symmetric.     Reflex Scores:      Brachioradialis reflexes are 2+ on the right side and 2+ on the left side.      Patellar reflexes are 2+ on the right side and 2+ on the left side. Psychiatric:        Attention and Perception: Attention normal.        Mood and Affect: Mood normal.        Speech: Speech normal.        Behavior: Behavior normal. Behavior is cooperative.        Thought Content: Thought content normal.    Results for orders placed or performed in visit on 07/11/23  Bayer DCA Hb A1c Waived   Collection Time: 07/11/23  8:54 AM  Result Value Ref Range   HB A1C (BAYER DCA - WAIVED) 5.6 4.8 - 5.6 %  Comprehensive metabolic panel with GFR   Collection Time: 07/11/23  8:54 AM  Result Value Ref Range   Glucose 82 70 - 99 mg/dL   BUN 16 8 - 27 mg/dL   Creatinine, Ser 8.65 (H) 0.57 - 1.00 mg/dL   eGFR 46 (L) >78 IO/NGE/9.52   BUN/Creatinine Ratio 13 12 - 28   Sodium 142 134 - 144 mmol/L   Potassium 4.0 3.5 - 5.2 mmol/L   Chloride 104 96 - 106 mmol/L   CO2 22 20 - 29 mmol/L   Calcium  9.5 8.7 - 10.3 mg/dL   Total Protein 7.0 6.0 - 8.5 g/dL   Albumin 4.3 3.9 - 4.9 g/dL   Globulin, Total 2.7 1.5 - 4.5 g/dL   Bilirubin Total 0.3 0.0 - 1.2 mg/dL   Alkaline Phosphatase 122 (H) 44 - 121 IU/L   AST 24 0 - 40 IU/L   ALT 13 0 - 32 IU/L  Lipid Panel w/o Chol/HDL Ratio   Collection Time: 07/11/23  8:54 AM  Result Value Ref Range   Cholesterol, Total 115 100 - 199 mg/dL   Triglycerides 841 0 - 149 mg/dL   HDL 40 >32 mg/dL   VLDL Cholesterol Cal 22 5 - 40 mg/dL   LDL Chol Calc (NIH) 53 0 - 99 mg/dL  TSH   Collection Time: 07/11/23  8:54 AM  Result Value Ref Range   TSH 0.862 0.450 - 4.500 uIU/mL  T4, free   Collection Time: 07/11/23  8:54 AM  Result Value Ref Range   Free T4 1.19 0.82 - 1.77 ng/dL      Assessment & Plan:   Problem List Items Addressed This  Visit       Endocrine   Type 2 diabetes mellitus with stage 3b chronic kidney disease, with  long-term current use of insulin  (HCC) (Chronic)   Chronic, ongoing with A1c 5.6% in April and urine ALB 18 April 2023. Recommend she check sugars 2 hours after a meal + fasting in morning, bring to visits. Have recommended she stop all meal time insulin , including dinner -- educated her that if BS is <180 she does not need to take meal time insulin  + should not take after meals.  We discussed at length. Return to Ozempic  1 MG weekly and continue Farxiga.  Maintain Tresiba  at current dose, but reduce by 3 units every 3 days if morning BS <130.  Goal is to discontinue insulin  in long run.  - Eye and foot exam up to date.   - Vaccines up to date.   - ACE and Statin on board.         Relevant Medications   Semaglutide , 1 MG/DOSE, 4 MG/3ML SOPN   Diabetes mellitus treated with insulin  (HCC) - Primary   Refer to diabetes with CKD plan of care.      Relevant Medications   Semaglutide , 1 MG/DOSE, 4 MG/3ML SOPN     Follow up plan: Return in about 2 months (around 10/16/2023) for T2DM, HTN/HLD, Depression.

## 2023-09-26 ENCOUNTER — Other Ambulatory Visit: Payer: Self-pay | Admitting: Nurse Practitioner

## 2023-09-26 DIAGNOSIS — J309 Allergic rhinitis, unspecified: Secondary | ICD-10-CM

## 2023-09-27 ENCOUNTER — Telehealth: Payer: Self-pay

## 2023-09-27 ENCOUNTER — Other Ambulatory Visit (HOSPITAL_COMMUNITY): Payer: Self-pay

## 2023-09-27 NOTE — Telephone Encounter (Signed)
 Prior auth team, can you guys check into this? Does the patient's Ozempic  need a PA?

## 2023-09-27 NOTE — Telephone Encounter (Signed)
 Requested Prescriptions  Pending Prescriptions Disp Refills   fluticasone  (FLONASE ) 50 MCG/ACT nasal spray [Pharmacy Med Name: FLUTICASONE  PROP 50 MCG SPRAY] 48 mL 1    Sig: PLACE 2 SPRAYS INTO BOTH NOSTRILS DAILY AS NEEDED FOR ALLERGIES.     Ear, Nose, and Throat: Nasal Preparations - Corticosteroids Passed - 09/27/2023  2:42 PM      Passed - Valid encounter within last 12 months    Recent Outpatient Visits           1 month ago Diabetes mellitus treated with insulin  (HCC)   [facility] [place], [place] T, NP   2 months ago Type 2 diabetes mellitus with stage 3b chronic kidney disease, with long-term current use of insulin  (HCC)   [facility] [place], Jolene T, NP   4 months ago Type 2 diabetes mellitus with stage 3b chronic kidney disease, with long-term current use of insulin  (HCC)   [facility] [place], Melanie DASEN, NP

## 2023-09-27 NOTE — Telephone Encounter (Signed)
 Copied from CRM [id number]. Topic: Clinical - Prescription Issue >> Sep 26, 2023  4:07 PM [name] wrote: Reason for CRM: patient calling saying that her ozempic  rx wont be covered under her insurance pt needs something else and or change dosage please call pt back [phone] (M)

## 2023-09-30 ENCOUNTER — Other Ambulatory Visit (HOSPITAL_COMMUNITY): Payer: Self-pay

## 2023-09-30 NOTE — Telephone Encounter (Signed)
 Per test claim, medication was filled through insurance 09/25/2023 and will fill again 11/27/2023, no PA submitted at this time, thank you.

## 2023-10-15 ENCOUNTER — Ambulatory Visit (INDEPENDENT_AMBULATORY_CARE_PROVIDER_SITE_OTHER): Payer: Self-pay | Admitting: Emergency Medicine

## 2023-10-15 VITALS — Ht 60.0 in | Wt 134.0 lb

## 2023-10-15 DIAGNOSIS — Z0000 Encounter for general adult medical examination without abnormal findings: Secondary | ICD-10-CM

## 2023-10-15 NOTE — Progress Notes (Signed)
 Subjective:   Kara Leach is a 73 y.o. who presents for a Medicare Wellness preventive visit.  As a reminder, Annual Wellness Visits don't include a physical exam, and some assessments may be limited, especially if this visit is performed virtually. We may recommend an in-person follow-up visit with your provider if needed.  Visit Complete: Virtual I connected with  Kara Leach on 10/15/23 by a audio enabled telemedicine application and verified that I am speaking with the correct person using two identifiers.  Patient Location: Home  Provider Location: Home Office  I discussed the limitations of evaluation and management by telemedicine. The patient expressed understanding and agreed to proceed.  Vital Signs: Because this visit was a virtual/telehealth visit, some criteria may be missing or patient reported. Any vitals not documented were not able to be obtained and vitals that have been documented are patient reported.  VideoDeclined- This patient declined Librarian, academic. Therefore the visit was completed with audio only.  Persons Participating in Visit: Patient.  AWV Questionnaire: No: Patient Medicare AWV questionnaire was not completed prior to this visit.  Cardiac Risk Factors include: advanced age (>63men, >54 women);diabetes mellitus;hypertension;dyslipidemia     Objective:    Today's Vitals   10/15/23 0959  Weight: 134 lb (60.8 kg)  Height: 5' (1.524 m)  PainSc: 4    Body mass index is 26.17 kg/m.     10/15/2023   10:17 AM 04/03/2023    2:24 PM 10/09/2022   10:13 AM 06/29/2022   10:05 AM 01/25/2022   11:06 AM 10/04/2021   11:09 AM 01/26/2021   10:54 AM  Advanced Directives  Does Patient Have a Medical Advance Directive? Yes Yes Yes No Yes Yes Yes  Type of Estate agent of [place];Living will  Healthcare Power of [place];Living will   Healthcare Power of [employer] of [place];Living  will  Does patient want to make changes to medical advance directive? No - Patient declined  No - Patient declined    No - Patient declined  Copy of Healthcare Power of Attorney in Chart? Yes - validated most recent copy scanned in chart (See row information)  Yes - validated most recent copy scanned in chart (See row information)   Yes - validated most recent copy scanned in chart (See row information)   Would patient like information on creating a medical advance directive?    No - Patient declined       Current Medications (verified) Outpatient Encounter Medications as of 10/15/2023  Medication Sig   acetaminophen  (TYLENOL ) 650 MG CR tablet Take 650 mg by mouth every 8 (eight) hours as needed for pain.   allopurinol  (ZYLOPRIM ) 100 MG tablet Take 1 tablet (100 mg total) by mouth daily.   aspirin  EC 81 MG tablet Take 81 mg by mouth daily.   Blood Glucose Monitoring Suppl (ONETOUCH VERIO REFLECT) w/Device KIT    Calcium  Carb-Cholecalciferol (CALCIUM  600 + D PO) Take 1 tablet by mouth 2 (two) times daily. 1000 IU VITAMIN D    dapagliflozin propanediol (FARXIGA) 10 MG TABS tablet Take 10 mg by mouth daily.   enalapril  (VASOTEC ) 2.5 MG tablet Take 1 tablet (2.5 mg total) by mouth daily.   fluticasone  (FLONASE ) 50 MCG/ACT nasal spray PLACE 2 SPRAYS INTO BOTH NOSTRILS DAILY AS NEEDED FOR ALLERGIES.   glucose blood (ONETOUCH ULTRA) test strip USE TO CHECK BLOOD SUGAR TWICE DAILY   insulin  degludec (TRESIBA  FLEXTOUCH) 100 UNIT/ML FlexTouch Pen INJECT 30 UNITS  INTO THE SKIN AT BEDTIME.   Insulin  Pen Needle (BD PEN NEEDLE NANO U/F) 32G X 4 MM MISC To use for insulin  injection daily   Lancets (ONETOUCH DELICA PLUS LANCET30G) MISC Use to check blood sugars 3 to 4 times a day.   Loratadine (CLARITIN PO) Take 10 mg by mouth as needed.   MELATONIN PO Take by mouth as needed.   metoprolol  succinate (TOPROL -XL) 25 MG 24 hr tablet Take 1 tablet (25 mg total) by mouth daily.   Semaglutide , 1 MG/DOSE, 4 MG/3ML  SOPN Inject 1 mg as directed once a week.   sertraline  (ZOLOFT ) 100 MG tablet Take 1 tablet (100 mg total) by mouth every morning.   simvastatin  (ZOCOR ) 40 MG tablet TAKE 1 TABLET BY MOUTH EVERYDAY AT BEDTIME   solifenacin  (VESICARE ) 10 MG tablet Take 1 tablet (10 mg total) by mouth daily.   Specialty Vitamins Products (VITA-RX DIABETIC VITAMIN) CAPS Take 1 packet by mouth daily. Diabetic Vitamins - 6 or 7 in 1 pack   No facility-administered encounter medications on file as of 10/15/2023.    Allergies (verified) Strawberry extract, Ibuprofen, and Cat dander   History: Past Medical History:  Diagnosis Date   Allergy    Anxiety    Arthritis    Osteoarthritis   Cancer (HCC) 05/2018   possibly on left breast but not sure   Chronic kidney disease    Depression    Diabetes mellitus without complication (HCC) 2012   Diabetic neuropathy (HCC)    feet   Diverticulitis 2017   GERD (gastroesophageal reflux disease)    Headache    sinus and migraines on occasion   Hyperlipidemia    Hypertension    Last menstrual period (LMP) > 10 days ago 1986   Obesity    Peripheral vascular disease (HCC)    neuropathy in hands and feet d/t diabetes   Renal insufficiency    Stage 2 kidney disease   Seizures (HCC)    due to high blood sugars. last time = 09/2014   Sleep apnea    has CPAP, can't tolerate   Stroke (HCC) 1980   minor. left side remains weaker than right   Wears hearing aid    right side   Wrist weakness    left - after fracture and repair   Past Surgical History:  Procedure Laterality Date   ABDOMINAL HYSTERECTOMY  1986   APPENDECTOMY  1964   BREAST BIOPSY Left 07/03/2016   benign, fibrocystic changes with calcifications, usual ductal hyperplasia.    BREAST BIOPSY Left 04/23/2018   radial scar   BREAST BIOPSY Left 08/13/2018   Procedure: WIDE LOCALIZATION AND OPEN BIOPSY OF LEFT BREAST, DIABETIC;  Surgeon: Kara Leach;  Location: [facility] OR[facility];  Service: General;   Laterality: Left;   BREAST EXCISIONAL BIOPSY Left 08/13/2018   CHOLECYSTECTOMY  2012   COLONOSCOPY WITH PROPOFOL  N/A 12/01/2015   Procedure: COLONOSCOPY WITH PROPOFOL ;  Surgeon: Kara Leach;  [facility];  Service: Endoscopy;  Laterality: N/A;  Diabetic - insulin  Sleep apnea   PATELLA FRACTURE SURGERY Left    1. had to recenter kneecap, 2 and 3 surgeries were arthroscopies   WRIST FRACTURE SURGERY Left 2005   Steel plate   Family History  Problem Relation Age of Onset   Diabetes Mother    Heart disease Mother    Hypertension Mother    Cancer Father        stomach   Diabetes Sister  Arthritis Sister    Stroke Sister    Hypertension Sister    Breast cancer Maternal Aunt 39       great aunt   Parkinson's disease Maternal Grandmother    Tremor Maternal Grandmother    Social History   Socioeconomic History   Marital status: Widowed    Spouse name: Not on file   Number of children: 2   Years of education: Not on file   Highest education level: Not on file  Occupational History   Occupation: worked at [employer] for 41 years    Comment: retired   Occupation: retired  Tobacco Use   Smoking status: Never   Smokeless tobacco: Never  Vaping Use   Vaping status: Never Used  Substance and Sexual Activity   Alcohol use: Yes    Comment: occasionally holiday, weddings   Drug use: No   Sexual activity: Never  Other Topics Concern   Not on file  Social History Narrative   10/15/23 son, daughter-in-law and grandchild lives with patient/pbt    Social Drivers of Health   Financial Resource Strain: Low Risk  (10/15/2023)   Overall Financial Resource Strain (CARDIA)    Difficulty of Paying Living Expenses: Not very hard  Food Insecurity: No Food Insecurity (10/15/2023)   Hunger Vital Sign    Worried About Running Out of Food in the Last Year: Never true    Ran Out of Food in the Last Year: Never true  Transportation Needs: No Transportation Needs (10/15/2023)    PRAPARE - Administrator, Civil Service (Medical): No    Lack of Transportation (Non-Medical): No  Physical Activity: Insufficiently Active (10/15/2023)   Exercise Vital Sign    Days of Exercise per Week: 7 days    Minutes of Exercise per Session: 10 min  Stress: No Stress Concern Present (10/15/2023)   [employer] of Occupational Health - Occupational Stress Questionnaire    Feeling of Stress: Only a little  Social Connections: Moderately Isolated (10/15/2023)   Social Connection and Isolation Panel    Frequency of Communication with Friends and Family: More than three times a week    Frequency of Social Gatherings with Friends and Family: More than three times a week    Attends Religious Services: More than 4 times per year    Active Member of [employer] or Organizations: No    Attends Banker Meetings: Never    Marital Status: Widowed    Tobacco Counseling Counseling given: Not Answered    Clinical Intake:  Pre-visit preparation completed: Yes  Pain : 0-10 Pain Score: 4  Pain Type: Chronic pain Pain Location: Knee Pain Orientation: Left Pain Descriptors / Indicators: Aching     BMI - recorded: 26.17 Nutritional Status: BMI 25 -29 Overweight Nutritional Risks: None Diabetes: Yes CBG done?: No (FBS 90 per patient) Did pt. bring in CBG monitor from home?: No  Lab Results  Component Value Date   HGBA1C 5.6 07/11/2023   HGBA1C 5.3 04/02/2023   HGBA1C 5.7 (H) 12/20/2022     How often do you need to have someone help you when you read instructions, pamphlets, or other written materials from your doctor or pharmacy?: 3 - Sometimes (son helps her) What is the last grade level you completed in school?: 12th  Interpreter Needed?: No  Information entered by :: Vina Ned, CMA   Activities of Daily Living     10/15/2023   10:04 AM 04/02/2023   10:50 AM  In  your present state of health, do you have any difficulty performing the following  activities:  Hearing? 1 0  Comment wears hearing aids   Vision? 0 0  Difficulty concentrating or making decisions? 1 0  Comment short term memory   Walking or climbing stairs? 1 0  Comment uses cane and scooter   Dressing or bathing? 0 0  Doing errands, shopping? 0 0  Preparing Food and eating ? N   Using the Toilet? N   In the past six months, have you accidently leaked urine? Y   Comment wears adult diaper   Do you have problems with loss of bowel control? N   Managing your Medications? N   Managing your Finances? N   Housekeeping or managing your Housekeeping? N     Patient Care Team: Cannady, Jolene T, NP as PCP - General (Nurse Practitioner) Lateef, Munsoor, Leach (Nephrology) Jacobo Evalene PARAS, Leach as Consulting Physician (Oncology) Jaye Fallow, Leach as Referring Physician (Ophthalmology)  I have updated your Care Teams any recent Medical Services you may have received from other providers in the past year.     Assessment:   This is a routine wellness examination for [place].  Hearing/Vision screen Hearing Screening - Comments:: Wears hearing aids Vision Screening - Comments:: Get DM eye exams, Dr. Jaye, [place]    Goals Addressed               This Visit's Progress     Weight (lb) < 120 lb (54.4 kg) (pt-stated)   134 lb (60.8 kg)      Depression Screen     10/15/2023   10:11 AM 07/11/2023    8:52 AM 05/10/2023   10:43 AM 04/02/2023   10:29 AM 01/17/2023   11:06 AM 12/20/2022   10:48 AM 10/09/2022   10:12 AM  PHQ 2/9 Scores  PHQ - 2 Score 1 2 2 2 2 2  0  PHQ- 9 Score 3 4 5 4 5 5  0    Fall Risk     10/15/2023   10:18 AM 07/11/2023    8:52 AM 05/10/2023   10:43 AM 04/02/2023   10:28 AM 12/20/2022   10:48 AM  Fall Risk   Falls in the past year? 1 0 1 1 0  Number falls in past yr: 0 0 1 1 0  Injury with Fall? 0 0 1 1 0  Risk for fall due to : History of fall(s);Impaired balance/gait;Orthopedic patient;Impaired mobility No Fall Risks History  of fall(s);Impaired balance/gait History of fall(s) No Fall Risks  Follow up Falls evaluation completed;Education provided Falls evaluation completed Falls evaluation completed Falls evaluation completed Falls evaluation completed    MEDICARE RISK AT HOME:  Medicare Risk at Home Any stairs in or around the home?: Yes If so, are there any without handrails?: No Home free of loose throw rugs in walkways, pet beds, electrical cords, etc?: Yes Adequate lighting in your home to reduce risk of falls?: Yes Life alert?: No Use of a cane, walker or w/c?: Yes (cane and scooter) Grab bars in the bathroom?: Yes Shower chair or bench in shower?: No Elevated toilet seat or a handicapped toilet?: Yes  TIMED UP AND GO:  Was the test performed?  No  Cognitive Function: 6CIT completed        10/15/2023   10:20 AM 10/09/2022   10:19 AM 10/04/2021   11:08 AM 02/15/2020    1:21 PM 12/05/2017   10:29 AM  6CIT Screen  What  Year? 0 points 0 points 0 points 0 points 0 points  What month? 0 points 0 points 0 points 0 points 0 points  What time? 0 points 0 points 0 points 0 points 0 points  Count back from 20 0 points 0 points 0 points 0 points 0 points  Months in reverse 0 points 0 points 0 points 0 points 0 points  Repeat phrase 0 points 0 points 0 points 0 points 0 points  Total Score 0 points 0 points 0 points 0 points 0 points    Immunizations Immunization History  Administered Date(s) Administered    sv, Bivalent, Protein Subunit Rsvpref,pf (Abrysvo) 05/02/2023   Fluad Quad(high Dose 65+) 02/11/2019, 11/20/2019, 12/29/2021   Fluad Trivalent(High Dose 65+) 12/20/2022   Influenza, High Dose Seasonal PF 12/16/2020   Influenza,inj,Quad PF,6+ Mos 11/01/2016, 11/13/2017   Influenza-Unspecified 12/20/2014, 12/28/2015   Moderna Covid-19 Fall Seasonal Vaccine 23yrs & older 12/29/2021   Moderna Covid-19 Vaccine Bivalent Booster 70yrs & up 12/16/2020   PFIZER(Purple Top)SARS-COV-2 Vaccination  05/13/2019, 06/03/2019, 01/20/2020   Pneumococcal Conjugate-13 02/12/2018   Pneumococcal Polysaccharide-23 02/14/2009, 08/12/2019   Td 06/24/2008, 05/29/2021   Tdap 01/08/2011, 06/29/2022   Zoster Recombinant(Shingrix) 10/13/2018, 12/16/2020   Zoster, Live 12/28/2015    Screening Tests Health Maintenance  Topic Date Due   COVID-19 Vaccine (6 - 2024-25 season) 11/18/2022   DEXA SCAN  10/09/2023   INFLUENZA VACCINE  10/18/2023   HEMOGLOBIN A1C  01/10/2024   Diabetic kidney evaluation - Urine ACR  04/01/2024   FOOT EXAM  04/01/2024   OPHTHALMOLOGY EXAM  04/21/2024   Diabetic kidney evaluation - eGFR measurement  07/10/2024   MAMMOGRAM  07/30/2024   Medicare Annual Wellness (AWV)  10/14/2024   Colonoscopy  11/30/2025   DTaP/Tdap/Td (5 - Td or Tdap) 06/28/2032   Pneumococcal Vaccine: 50+ Years  Completed   Hepatitis C Screening  Completed   Zoster Vaccines- Shingrix  Completed   Hepatitis B Vaccines  Aged Out   HPV VACCINES  Aged Out   Meningococcal B Vaccine  Aged Out    Health Maintenance  Health Maintenance Due  Topic Date Due   COVID-19 Vaccine (6 - 2024-25 season) 11/18/2022   DEXA SCAN  10/09/2023   Health Maintenance Items Addressed: See Nurse Notes at the end of this note  Additional Screening:  Vision Screening: Recommended annual ophthalmology exams for early detection of glaucoma and other disorders of the eye. Would you like a referral to an eye doctor? No    Dental Screening: Recommended annual dental exams for proper oral hygiene  Community Resource Referral / Chronic Care Management: CRR required this visit?  No   CCM required this visit?  No   Plan:    I have personally reviewed and noted the following in the patient's chart:   Medical and social history Use of alcohol, tobacco or illicit drugs  Current medications and supplements including opioid prescriptions. Patient is not currently taking opioid prescriptions. Functional ability and  status Nutritional status Physical activity Advanced directives List of other physicians Hospitalizations, surgeries, and ER visits in previous 12 months Vitals Screenings to include cognitive, depression, and falls Referrals and appointments  In addition, I have reviewed and discussed with patient certain preventive protocols, quality metrics, and best practice recommendations. A written personalized care plan for preventive services as well as general preventive health recommendations were provided to patient.   Vina Ned, CMA   10/15/2023   After Visit Summary: (Mail) Due to this being  a telephonic visit, the after visit summary with patients personalized plan was offered to patient via mail   Notes:  FBS this morning 90 per patient Gave ph# to call and schedule DEXA scan Declined Covid vaccine

## 2023-10-15 NOTE — Patient Instructions (Signed)
 Ms. Kara Leach , Thank you for taking time out of your busy schedule to complete your Annual Wellness Visit with me. I enjoyed our conversation and look forward to speaking with you again next year. I, as well as your care team,  appreciate your ongoing commitment to your health goals. Please review the following plan we discussed and let me know if I can assist you in the future. Your Game plan/ To Do List    Referrals: None   Follow up Visits: Next Medicare AWV with our clinical staff: 10/20/24 @ 10:00am (PHONE VISIT)   Have you seen your provider in the last 6 months (3 months if uncontrolled diabetes)? Yes Next Office Visit with your provider: 10/31/23 @ 8:00am with Jolene Cannady, NP  Clinician Recommendations:  Aim for 30 minutes of exercise or brisk walking, 6-8 glasses of water , and 5 servings of fruits and vegetables each day.  Please call to schedule your bone density scan:  [facility] Address: [address], [place], [place] Phone: [phone]  [facility] at [facility] [address] [place], [place] [address] Phone: [phone]       This is a list of the screening recommended for you and due dates:  Health Maintenance  Topic Date Due   COVID-19 Vaccine (6 - 2024-25 season) 11/18/2022   DEXA scan (bone density measurement)  10/09/2023   Flu Shot  10/18/2023   Hemoglobin A1C  01/10/2024   Yearly kidney health urinalysis for diabetes  04/01/2024   Complete foot exam   04/01/2024   Eye exam for diabetics  04/21/2024   Yearly kidney function blood test for diabetes  07/10/2024   Mammogram  07/30/2024   Medicare Annual Wellness Visit  10/14/2024   Colon Cancer Screening  11/30/2025   DTaP/Tdap/Td vaccine (5 - Td or Tdap) 06/28/2032   Pneumococcal Vaccine for age over 59  Completed   Hepatitis C Screening  Completed   Zoster (Shingles) Vaccine  Completed   Hepatitis B Vaccine  Aged Out   HPV Vaccine  Aged  Out   Meningitis B Vaccine  Aged Out    Advanced directives: (In Chart) A copy of your advanced directives are scanned into your chart should your provider ever need it. Advance Care Planning is important because it:  [x]  Makes sure you receive the medical care that is consistent with your values, goals, and preferences  [x]  It provides guidance to your family and loved ones and reduces their decisional burden about whether or not they are making the right decisions based on your wishes.  Follow the link provided in your after visit summary or read over the paperwork we have mailed to you to help you started getting your Advance Directives in place. If you need assistance in completing these, please reach out to us  so that we can help you!  See attachments for Preventive Care and Fall Prevention Tips.   Fall Prevention in the Home, Adult Falls can cause injuries and affect people of all ages. There are many simple things that you can do to make your home safe and to help prevent falls. If you need it, ask for help making these changes. What actions can I take to prevent falls? General information Use good lighting in all rooms. Make sure to: Replace any light bulbs that burn out. Turn on lights if it is dark and use night-lights. Keep items that you use often in easy-to-reach places. Lower  the shelves around your home if needed. Move furniture so that there are clear paths around it. Do not keep throw rugs or other things on the floor that can make you trip. If any of your floors are uneven, fix them. Add color or contrast paint or tape to clearly mark and help you see: Grab bars or handrails. First and last steps of staircases. Where the edge of each step is. If you use a ladder or stepladder: Make sure that it is fully opened. Do not climb a closed ladder. Make sure the sides of the ladder are locked in place. Have someone hold the ladder while you use it. Know where your pets  are as you move through your home. What can I do in the bathroom?     Keep the floor dry. Clean up any water  that is on the floor right away. Remove soap buildup in the bathtub or shower. Buildup makes bathtubs and showers slippery. Use non-skid mats or decals on the floor of the bathtub or shower. Attach bath mats securely with double-sided, non-slip rug tape. If you need to sit down while you are in the shower, use a non-slip stool. Install grab bars by the toilet and in the bathtub and shower. Do not use towel bars as grab bars. What can I do in the bedroom? Make sure that you have a light by your bed that is easy to reach. Do not use any sheets or blankets on your bed that hang to the floor. Have a firm bench or chair with side arms that you can use for support when you get dressed. What can I do in the kitchen? Clean up any spills right away. If you need to reach something above you, use a sturdy step stool that has a grab bar. Keep electrical cables out of the way. Do not use floor polish or wax that makes floors slippery. What can I do with my stairs? Do not leave anything on the stairs. Make sure that you have a light switch at the top and the bottom of the stairs. Have them installed if you do not have them. Make sure that there are handrails on both sides of the stairs. Fix handrails that are broken or loose. Make sure that handrails are as long as the staircases. Install non-slip stair treads on all stairs in your home if they do not have carpet. Avoid having throw rugs at the top or bottom of stairs, or secure the rugs with carpet tape to prevent them from moving. Choose a carpet design that does not hide the edge of steps on the stairs. Make sure that carpet is firmly attached to the stairs. Fix any carpet that is loose or worn. What can I do on the outside of my home? Use bright outdoor lighting. Repair the edges of walkways and driveways and fix any cracks. Clear paths of  anything that can make you trip, such as tools or rocks. Add color or contrast paint or tape to clearly mark and help you see high doorway thresholds. Trim any bushes or trees on the main path into your home. Check that handrails are securely fastened and in good repair. Both sides of all steps should have handrails. Install guardrails along the edges of any raised decks or porches. Have leaves, snow, and ice cleared regularly. Use sand, salt, or ice melt on walkways during winter months if you live where there is ice and snow. In the garage, clean up any  spills right away, including grease or oil spills. What other actions can I take? Review your medicines with your health care provider. Some medicines can make you confused or feel dizzy. This can increase your chance of falling. Wear closed-toe shoes that fit well and support your feet. Wear shoes that have rubber soles and low heels. Use a cane, walker, scooter, or crutches that help you move around if needed. Talk with your provider about other ways that you can decrease your risk of falls. This may include seeing a physical therapist to learn to do exercises to improve movement and strength. Where to find more information Centers for Disease Control and Prevention, STEADI: TonerPromos.no [employer] on Aging: BaseRingTones.pl National Institute on Aging: BaseRingTones.pl Contact a health care provider if: You are afraid of falling at home. You feel weak, drowsy, or dizzy at home. You fall at home. Get help right away if you: Lose consciousness or have trouble moving after a fall. Have a fall that causes a head injury. These symptoms may be an emergency. Get help right away. Call 911. Do not wait to see if the symptoms will go away. Do not drive yourself to the hospital. This information is not intended to replace advice given to you by your health care provider. Make sure you discuss any questions you have with your health care  provider. Document Revised: 11/06/2021 Document Reviewed: 11/06/2021 Elsevier Patient Education  2024 [employer].

## 2023-10-19 ENCOUNTER — Other Ambulatory Visit: Payer: Self-pay | Admitting: Nurse Practitioner

## 2023-10-21 NOTE — Telephone Encounter (Signed)
 Requested Prescriptions  Pending Prescriptions Disp Refills   insulin  degludec (TRESIBA  FLEXTOUCH) 100 UNIT/ML FlexTouch Pen [Pharmacy Med Name: TRESIBA  FLEXTOUCH 100 UNIT/ML] 9 mL 0    Sig: INJECT 30 UNITS INTO THE SKIN AT BEDTIME.     Endocrinology:  Diabetes - Insulins Passed - 10/21/2023  3:19 PM      Passed - HBA1C is between 0 and 7.9 and within 180 days    Hemoglobin A1C  Date Value Ref Range Status  11/12/2013 13.4 (H) 4.2 - 6.3 % Final    Comment:    The American Diabetes Association recommends that a primary goal of therapy should be <7% and that physicians should reevaluate the treatment regimen in patients with HbA1c values consistently >8%.    HB A1C (BAYER DCA - WAIVED)  Date Value Ref Range Status  07/11/2023 5.6 4.8 - 5.6 % Final    Comment:             Prediabetes: 5.7 - 6.4          Diabetes: >6.4          Glycemic control for adults with diabetes: <7.0          Passed - Valid encounter within last 6 months    Recent Outpatient Visits           2 months ago Diabetes mellitus treated with insulin  (HCC)   [place] [facility] [place], Bascom T, NP   3 months ago Type 2 diabetes mellitus with stage 3b chronic kidney disease, with long-term current use of insulin  (HCC)   [place] [facility] [place], Jolene T, NP   5 months ago Type 2 diabetes mellitus with stage 3b chronic kidney disease, with long-term current use of insulin  (HCC)   [place] [facility] [place], Melanie DASEN, NP

## 2023-10-26 NOTE — Patient Instructions (Addendum)
 Please call to schedule your mammogram and/or bone density: [facility]  Address: [address], [place], [place] [address] Phone: [phone]  [facility] [address] [place],  [place]  [address] Phone: [phone]    Be Involved in Caring For Your Health:  Taking Medications When medications are taken as directed, they can greatly improve your health. But if they are not taken as prescribed, they may not work. In some cases, not taking them correctly can be harmful. To help ensure your treatment remains effective and safe, understand your medications and how to take them. Bring your medications to each visit for review by your provider.  Your lab results, notes, and after visit summary will be available on My Chart. We strongly encourage you to use this feature. If lab results are abnormal the clinic will contact you with the appropriate steps. If the clinic does not contact you assume the results are satisfactory. You can always view your results on My Chart. If you have questions regarding your health or results, please contact the clinic during office hours. You can also ask questions on My Chart.  We at [facility] are grateful that you chose us  to provide your care. We strive to provide evidence-based and compassionate care and are always looking for feedback. If you get a survey from the clinic please complete this so we can hear your opinions.  Diabetes Mellitus and Exercise Regular exercise is important for your health, especially if you have diabetes mellitus. Exercise is not just about losing weight. It can also help you increase muscle strength and bone density and reduce body fat and stress. This can help your level of endurance and make you more fit and flexible. Why should I exercise if I have diabetes? Exercise has many benefits for people with diabetes. It can: Help lower and  control your blood sugar (glucose). Help your body respond better and become more sensitive to the hormone insulin. Reduce how much insulin your body needs. Lower your risk for heart disease by: Lowering how much bad cholesterol and triglycerides you have in your body. Increasing how much good cholesterol you have in your body. Lowering your blood pressure. Lowering your blood glucose levels. What is my activity plan? Your health care provider or an expert trained in diabetes care (certified diabetes educator) can help you make an activity plan. This plan can help you find the type of exercise that works for you. It may also tell you how often to exercise and for how long. Be sure to: Get at least 150 minutes of medium-intensity or high-intensity exercise each week. This may involve brisk walking, biking, or water aerobics. Do stretching and strengthening exercises at least 2 times a week. This may involve yoga or weight lifting. Spread out your activity over at least 3 days of the week. Get some form of physical activity each day. Do not go more than 2 days in a row without some kind of activity. Avoid being inactive for more than 30 minutes at a time. Take frequent breaks to walk or stretch. Choose activities that you enjoy. Set goals that you know you can accomplish. Start slowly and increase the intensity of your exercise over time. How do I manage my diabetes during exercise?  Monitor your blood glucose Check your blood glucose before and after you exercise. If your blood glucose is 240 mg/dL (86.6 mmol/L) or higher before you  exercise, check your urine for ketones. These are chemicals created by the liver. If you have ketones in your urine, do not exercise until your blood glucose returns to normal. If your blood glucose is 100 mg/dL (5.6 mmol/L) or lower, eat a snack that has 15-20 grams of carbohydrate in it. Check your blood glucose 15 minutes after the snack to make sure that  your level is above 100 mg/dL (5.6 mmol/L) before you start to exercise. Your risk for low blood glucose (hypoglycemia) goes up during and after exercise. Know the symptoms of this condition and how to treat it. Follow these instructions at home: Keep a carbohydrate snack on hand for use before, during, and after exercise. This can help prevent or treat hypoglycemia. Avoid injecting insulin into parts of your body that are going to be used during exercise. This may include: Your arms, when you are going to play tennis. Your legs, when you are about to go jogging. Keep track of your exercise habits. This can help you and your health care provider watch and adjust your activity plan. Write down: What you eat before and after you exercise. Blood glucose levels before and after you exercise. The type and amount of exercise you do. Talk to your health care provider before you start a new activity. They may need to: Make sure that the activity is safe for you. Adjust your insulin, other medicines, and food that you eat. Drink water while you exercise. This can stop you from losing too much water (dehydration). It can also prevent problems caused by having a lot of heat in your body (heat stroke). Where to find more information American Diabetes Association: diabetes.org Association of Diabetes Care & Education Specialists: diabeteseducator.org This information is not intended to replace advice given to you by your health care provider. Make sure you discuss any questions you have with your health care provider. Document Revised: 08/23/2021 Document Reviewed: 08/23/2021 Elsevier Patient Education  2024 [employer].

## 2023-10-31 ENCOUNTER — Encounter: Payer: Self-pay | Admitting: Nurse Practitioner

## 2023-10-31 ENCOUNTER — Ambulatory Visit: Admitting: Nurse Practitioner

## 2023-10-31 VITALS — BP 128/66 | HR 80 | Temp 98.7°F | Ht 60.0 in | Wt 133.6 lb

## 2023-10-31 DIAGNOSIS — E1169 Type 2 diabetes mellitus with other specified complication: Secondary | ICD-10-CM

## 2023-10-31 DIAGNOSIS — N3281 Overactive bladder: Secondary | ICD-10-CM | POA: Diagnosis not present

## 2023-10-31 DIAGNOSIS — E1159 Type 2 diabetes mellitus with other circulatory complications: Secondary | ICD-10-CM | POA: Diagnosis not present

## 2023-10-31 DIAGNOSIS — E785 Hyperlipidemia, unspecified: Secondary | ICD-10-CM | POA: Diagnosis not present

## 2023-10-31 DIAGNOSIS — D472 Monoclonal gammopathy: Secondary | ICD-10-CM | POA: Diagnosis not present

## 2023-10-31 DIAGNOSIS — N1832 Chronic kidney disease, stage 3b: Secondary | ICD-10-CM

## 2023-10-31 DIAGNOSIS — E1122 Type 2 diabetes mellitus with diabetic chronic kidney disease: Secondary | ICD-10-CM

## 2023-10-31 DIAGNOSIS — E119 Type 2 diabetes mellitus without complications: Secondary | ICD-10-CM

## 2023-10-31 DIAGNOSIS — E66811 Obesity, class 1: Secondary | ICD-10-CM

## 2023-10-31 DIAGNOSIS — I152 Hypertension secondary to endocrine disorders: Secondary | ICD-10-CM

## 2023-10-31 DIAGNOSIS — Z794 Long term (current) use of insulin: Secondary | ICD-10-CM

## 2023-10-31 DIAGNOSIS — F324 Major depressive disorder, single episode, in partial remission: Secondary | ICD-10-CM

## 2023-10-31 LAB — BAYER DCA HB A1C WAIVED: HB A1C (BAYER DCA - WAIVED): 5.9 % — ABNORMAL HIGH (ref 4.8–5.6)

## 2023-10-31 NOTE — Assessment & Plan Note (Signed)
 Chronic, ongoing.  Continue current medication regimen and adjust as needed. Lipid panel today.

## 2023-10-31 NOTE — Assessment & Plan Note (Signed)
Chronic, stable.  Continue collaboration with Dr. Finnegan at [facility] - recent notes and labs reviewed. 

## 2023-10-31 NOTE — Assessment & Plan Note (Signed)
 Chronic, stable.  Denies SI/HI.  Continue current medication regimen, Sertraline  100 MG daily, and adjust as needed. Recommend meditation and relaxation techniques at home.

## 2023-10-31 NOTE — Assessment & Plan Note (Signed)
 Refer to diabetes with CKD plan of care.

## 2023-10-31 NOTE — Assessment & Plan Note (Signed)
 Stable with Vesicare  on board.  Educated her on OAB and stress incontinence.  Will continue Vesicare  10 MG daily and adjust as needed based on kidney function.  Educated her on this and recommend she immediately alert provider if any anticholinergic effects present.  Would like to use Myrbetriq, but appears to be higher cost for her.  If ongoing in future, consider urology referral.  Lower Vesicare  to 5 MG if CrCl <30.  She has attended pelvic floor therapy in past.

## 2023-10-31 NOTE — Assessment & Plan Note (Addendum)
 Chronic, ongoing with A1c 5.6% in April and urine ALB 18 April 2023. Recommend she check sugars 2 hours after a meal + fasting in morning, bring to visits. She has stopped all meal time insulin  as instructed and sugars remain stable, will recheck A1c today and adjust Tresiba  as needed.  We discussed at length. Continue Ozempic  1 MG weekly and Farxiga.  Maintain Tresiba  at current dose, but reduce by 3 units every 3 days if morning BS <130.  Goal is to discontinue insulin  in long run. Continues to lose weight slowly with Ozempic  which has been beneficial overall for her. - Eye and foot exam up to date.   - Vaccines up to date.   - ACE and Statin on board.

## 2023-10-31 NOTE — Progress Notes (Signed)
 BP 128/66 (BP Location: Left Arm)   Pulse 80   Temp 98.7 F (37.1 C) (Oral)   Ht 5' (1.524 m)   Wt 133 lb 9.6 oz (60.6 kg)   LMP  (LMP Unknown)   SpO2 97%   BMI 26.09 kg/m    Subjective:    Patient ID: Kara Leach, female    DOB: 04-Jan-1951, 73 y.o.   MRN: [id number]  HPI: Kara Leach is a 73 y.o. female  Chief Complaint  Patient presents with   Depression   Diabetes   Hyperlipidemia   Hypertension   DIABETES A1c at last visit in April was 5.5%. She continues to take Tresiba , Ozempic , Doreen (works with PharmD on assistance with this).  Novolog  stopped at beginning of year. Hypoglycemic episodes: occasional Polydipsia/polyuria: no Visual disturbance: no Chest pain: no Paresthesias: no Glucose Monitoring: yes             Accucheck frequency: BID -- 92 this morning             Fasting glucose: 88 to 105 range             Post prandial:             Evening: 150 range             Before meals: Taking Insulin ?: yes             Long acting insulin : Tresiba  24 units             Short acting insulin : none Blood Pressure Monitoring: daily Retinal Examination: Up To Date Foot Exam: Up to Date Pneumovax: Up to Date Influenza: Up to Date Aspirin : yes    HYPERTENSION / HYPERLIPIDEMIA Taking Enalapril , Metoprolol , ASA + Simvastatin .  Satisfied with current treatment? yes Duration of hypertension: chronic BP monitoring frequency: daily BP range: <130/80 on average  BP medication side effects: no Duration of hyperlipidemia: chronic Cholesterol medication side effects: no Cholesterol supplements: none Medication compliance: good compliance Aspirin : yes Recent stressors: no Recurrent headaches: no Visual changes: no Palpitations: no Dyspnea: no Chest pain: no Lower extremity edema: no Dizzy/lightheaded: no The ASCVD Risk score (Arnett DK, et al., 2019) failed to calculate for the following reasons:   Risk score cannot be calculated because patient has a  medical history suggesting prior/existing ASCVD  CHRONIC KIDNEY DISEASE (CKD 3a) & MGUS Saw nephrology last on 12/06/22. Has yearly visits and labs with oncology. Last saw them on 01/28/23. In past followed with Dr. Dessa after her mammograms for history of biopsies, last saw 05/05/20, he has retired. Continues to take Vesicare  for her OAB. CKD status: stable Medications renally dose: yes Previous renal evaluation: no Pneumovax:  Up to Date Influenza Vaccine:  Up to Date   DEPRESSION Taking Sertraline  100 MG.   Mood status: stable Satisfied with current treatment?: yes Symptom severity: moderate  Duration of current treatment : chronic Side effects: no Medication compliance: good compliance Psychotherapy/counseling: none Previous psychiatric medications: Zoloft  Depressed mood: sometimes Anxious mood: sometimes Anhedonia: no Significant weight loss or gain: no Insomnia: no Fatigue: no Feelings of worthlessness or guilt: no Impaired concentration/indecisiveness: yes Suicidal ideations: no Hopelessness: no Crying spells: no    10/31/2023    8:22 AM 10/15/2023   10:11 AM 07/11/2023    8:52 AM 05/10/2023   10:43 AM 04/02/2023   10:29 AM  Depression screen PHQ 2/9  Decreased Interest 1 0 1 1 1   Down, Depressed, Hopeless 1 1 1  1  1  PHQ - 2 Score 2 1 2 2 2   Altered sleeping 1 1 1 1 1   Tired, decreased energy 1 1 1 1 1   Change in appetite 0 0 0 0 0  Feeling bad or failure about yourself  0 0 0 1 0  Trouble concentrating 0 0 0 0 0  Moving slowly or fidgety/restless 0 0 0 0 0  Suicidal thoughts 0 0 0 0 0  PHQ-9 Score 4 3 4 5 4   Difficult doing work/chores Somewhat difficult Not difficult at all Somewhat difficult Somewhat difficult Somewhat difficult       10/31/2023    8:23 AM 07/11/2023    8:52 AM 05/10/2023   10:44 AM 04/02/2023   10:29 AM  GAD 7 : Generalized Anxiety Score  Nervous, Anxious, on Edge 1 1 1 1   Control/stop worrying 1 0 1 1  Worry too much - different things  1 0 0 1  Trouble relaxing 1 0 0 1  Restless 0 0 0 0  Easily annoyed or irritable 0 0 0 0  Afraid - awful might happen 1 1 1 1   Total GAD 7 Score 5 2 3 5   Anxiety Difficulty Somewhat difficult Somewhat difficult Somewhat difficult Somewhat difficult   Relevant past medical, surgical, family and social history reviewed and updated as indicated. Interim medical history since our last visit reviewed. Allergies and medications reviewed and updated.  Review of Systems  Constitutional:  Negative for activity change, appetite change, diaphoresis, fatigue and fever.  Respiratory:  Negative for cough, chest tightness and shortness of breath.   Cardiovascular:  Negative for chest pain, palpitations and leg swelling.  Gastrointestinal: Negative.   Endocrine: Negative for cold intolerance, heat intolerance, polydipsia, polyphagia and polyuria.  Neurological: Negative.   Psychiatric/Behavioral:  Negative for decreased concentration, self-injury, sleep disturbance and suicidal ideas. The patient is nervous/anxious.     Per HPI unless specifically indicated above     Objective:    BP 128/66 (BP Location: Left Arm)   Pulse 80   Temp 98.7 F (37.1 C) (Oral)   Ht 5' (1.524 m)   Wt 133 lb 9.6 oz (60.6 kg)   LMP  (LMP Unknown)   SpO2 97%   BMI 26.09 kg/m   Wt Readings from Last 3 Encounters:  10/31/23 133 lb 9.6 oz (60.6 kg)  10/15/23 134 lb (60.8 kg)  08/15/23 135 lb (61.2 kg)    Physical Exam Vitals and nursing note reviewed.  Constitutional:      General: She is awake. She is not in acute distress.    Appearance: She is well-developed and well-groomed. She is not ill-appearing or toxic-appearing.  HENT:     Head: Normocephalic.     Right Ear: Hearing and external ear normal.     Left Ear: Hearing and external ear normal.  Eyes:     General: Lids are normal.        Right eye: No discharge.        Left eye: No discharge.     Conjunctiva/sclera: Conjunctivae normal.     Pupils:  Pupils are equal, round, and reactive to light.  Neck:     Thyroid : No thyromegaly.     Vascular: No carotid bruit.  Cardiovascular:     Rate and Rhythm: Normal rate and regular rhythm.     Heart sounds: Normal heart sounds. No murmur heard.    No gallop.  Pulmonary:     Effort: Pulmonary effort is normal.  No accessory muscle usage or respiratory distress.     Breath sounds: Normal breath sounds.  Abdominal:     General: Bowel sounds are normal. There is no distension.     Palpations: Abdomen is soft.     Tenderness: There is no abdominal tenderness.  Musculoskeletal:     Cervical back: Normal range of motion and neck supple.     Right lower leg: No edema.     Left lower leg: No edema.  Lymphadenopathy:     Cervical: No cervical adenopathy.  Skin:    General: Skin is warm and dry.  Neurological:     Mental Status: She is alert and oriented to person, place, and time.     Deep Tendon Reflexes: Reflexes are normal and symmetric.     Reflex Scores:      Brachioradialis reflexes are 2+ on the right side and 2+ on the left side.      Patellar reflexes are 2+ on the right side and 2+ on the left side. Psychiatric:        Attention and Perception: Attention normal.        Mood and Affect: Mood normal.        Speech: Speech normal.        Behavior: Behavior normal. Behavior is cooperative.        Thought Content: Thought content normal.    Results for orders placed or performed in visit on 07/11/23  Bayer DCA Hb A1c Waived   Collection Time: 07/11/23  8:54 AM  Result Value Ref Range   HB A1C (BAYER DCA - WAIVED) 5.6 4.8 - 5.6 %  Comprehensive metabolic panel with GFR   Collection Time: 07/11/23  8:54 AM  Result Value Ref Range   Glucose 82 70 - 99 mg/dL   BUN 16 8 - 27 mg/dL   Creatinine, Ser 8.74 (H) 0.57 - 1.00 mg/dL   eGFR 46 (L) >40 fO/fpw/8.26   BUN/Creatinine Ratio 13 12 - 28   Sodium 142 134 - 144 mmol/L   Potassium 4.0 3.5 - 5.2 mmol/L   Chloride 104 96 - 106  mmol/L   CO2 22 20 - 29 mmol/L   Calcium  9.5 8.7 - 10.3 mg/dL   Total Protein 7.0 6.0 - 8.5 g/dL   Albumin 4.3 3.9 - 4.9 g/dL   Globulin, Total 2.7 1.5 - 4.5 g/dL   Bilirubin Total 0.3 0.0 - 1.2 mg/dL   Alkaline Phosphatase 122 (H) 44 - 121 IU/L   AST 24 0 - 40 IU/L   ALT 13 0 - 32 IU/L  Lipid Panel w/o Chol/HDL Ratio   Collection Time: 07/11/23  8:54 AM  Result Value Ref Range   Cholesterol, Total 115 100 - 199 mg/dL   Triglycerides 879 0 - 149 mg/dL   HDL 40 >60 mg/dL   VLDL Cholesterol Cal 22 5 - 40 mg/dL   LDL Chol Calc (NIH) 53 0 - 99 mg/dL  TSH   Collection Time: 07/11/23  8:54 AM  Result Value Ref Range   TSH 0.862 0.450 - 4.500 uIU/mL  T4, free   Collection Time: 07/11/23  8:54 AM  Result Value Ref Range   Free T4 1.19 0.82 - 1.77 ng/dL      Assessment & Plan:   Problem List Items Addressed This Visit       Cardiovascular and Mediastinum   Hypertension associated with diabetes (HCC) (Chronic)   Chronic, stable.  BP remains at goal in office on  recheck (was rushing to office so initial level elevated) and at home for her age.  Continue current medication regimen and adjust as needed.  Alert provider if BP consistently >130/80 at home and then may restart Amlodipine  at 2.5 MG.  Continue Metoprolol  and Enalapril , adjust as needed.  Focus on DASH diet.  Enalapril  for kidney protection.  LABS: CMP.      Relevant Orders   Bayer DCA Hb A1c Waived   Comprehensive metabolic panel with GFR     Endocrine   Type 2 diabetes mellitus with stage 3b chronic kidney disease, with long-term current use of insulin  (HCC) - Primary (Chronic)   Chronic, ongoing with A1c 5.6% in April and urine ALB 18 April 2023. Recommend she check sugars 2 hours after a meal + fasting in morning, bring to visits. She has stopped all meal time insulin  as instructed and sugars remain stable, will recheck A1c today and adjust Tresiba  as needed.  We discussed at length. Continue Ozempic  1 MG weekly and  Farxiga.  Maintain Tresiba  at current dose, but reduce by 3 units every 3 days if morning BS <130.  Goal is to discontinue insulin  in long run. Continues to lose weight slowly with Ozempic  which has been beneficial overall for her. - Eye and foot exam up to date.   - Vaccines up to date.   - ACE and Statin on board.         Relevant Orders   Bayer DCA Hb A1c Waived   Hyperlipidemia associated with type 2 diabetes mellitus (HCC) (Chronic)   Chronic, ongoing.  Continue current medication regimen and adjust as needed.  Lipid panel today.      Relevant Orders   Bayer DCA Hb A1c Waived   Comprehensive metabolic panel with GFR   Lipid Panel w/o Chol/HDL Ratio   Diabetes mellitus treated with insulin  (HCC)   Refer to diabetes with CKD plan of care.      Relevant Orders   Bayer DCA Hb A1c Waived     Genitourinary   OAB (overactive bladder)   Stable with Vesicare  on board.  Educated her on OAB and stress incontinence.  Will continue Vesicare  10 MG daily and adjust as needed based on kidney function.  Educated her on this and recommend she immediately alert provider if any anticholinergic effects present.  Would like to use Myrbetriq, but appears to be higher cost for her.  If ongoing in future, consider urology referral.  Lower Vesicare  to 5 MG if CrCl <30.  She has attended pelvic floor therapy in past.        Other   MGUS (monoclonal gammopathy of unknown significance) (Chronic)   Chronic, stable.  [facility] collaboration [facility] at [facility] - recent notes and labs reviewed.      Depression, major, single episode, in partial remission (HCC)   Chronic, stable.  Denies SI/HI.  Continue current medication regimen, Sertraline  100 MG daily, and adjust as needed. Recommend meditation and relaxation techniques at home.        Follow up plan: Return in about 3 months (around 01/31/2024) for T2DM, HTN/HLD, Depression, CKD, MGUS.

## 2023-10-31 NOTE — Assessment & Plan Note (Signed)
 Chronic, stable.  BP remains at goal in office on recheck (was rushing to office so initial level elevated) and at home for her age.  Continue current medication regimen and adjust as needed.  Alert provider if BP consistently >130/80 at home and then may restart Amlodipine  at 2.5 MG.  Continue Metoprolol  and Enalapril , adjust as needed.  Focus on DASH diet.  Enalapril  for kidney protection.  LABS: CMP.

## 2023-11-01 ENCOUNTER — Ambulatory Visit: Payer: Self-pay | Admitting: Nurse Practitioner

## 2023-11-01 LAB — COMPREHENSIVE METABOLIC PANEL WITH GFR
ALT: 18 IU/L (ref 0–32)
AST: 30 IU/L (ref 0–40)
Albumin: 4.1 g/dL (ref 3.9–4.9)
Alkaline Phosphatase: 108 IU/L (ref 44–121)
BUN/Creatinine Ratio: 21 (ref 12–28)
BUN: 22 mg/dL (ref 8–27)
Bilirubin Total: 0.2 mg/dL (ref 0.0–1.2)
CO2: 22 mmol/L (ref 20–29)
Calcium: 9.4 mg/dL (ref 8.7–10.3)
Chloride: 104 mmol/L (ref 96–106)
Creatinine, Ser: 1.04 mg/dL — ABNORMAL HIGH (ref 0.57–1.00)
Globulin, Total: 2.6 g/dL (ref 1.5–4.5)
Glucose: 82 mg/dL (ref 70–99)
Potassium: 4.1 mmol/L (ref 3.5–5.2)
Sodium: 141 mmol/L (ref 134–144)
Total Protein: 6.7 g/dL (ref 6.0–8.5)
eGFR: 58 mL/min/1.73 — ABNORMAL LOW (ref >59)

## 2023-11-01 LAB — LIPID PANEL W/O CHOL/HDL RATIO
Cholesterol, Total: 124 mg/dL (ref 100–199)
HDL: 46 mg/dL (ref >39)
LDL Chol Calc (NIH): 60 mg/dL (ref 0–99)
Triglycerides: 92 mg/dL (ref 0–149)
VLDL Cholesterol Cal: 18 mg/dL (ref 5–40)

## 2023-11-01 NOTE — Progress Notes (Signed)
 Good afternoon, please let Kara Leach know her labs have returned and continue to show stable chronic kidney disease stage 3a with no worsening.  Lipid panel is stable, No other medication changes needed. Keep being stellar!!  Thank you for allowing me to participate in your care.  I appreciate you. Kindest regards, Aanika Defoor

## 2023-12-10 ENCOUNTER — Other Ambulatory Visit: Payer: Self-pay | Admitting: Nurse Practitioner

## 2023-12-10 NOTE — Telephone Encounter (Signed)
 Requested Prescriptions  Pending Prescriptions Disp Refills   insulin  degludec (TRESIBA  FLEXTOUCH) 100 UNIT/ML FlexTouch Pen [Pharmacy Med Name: TRESIBA  FLEXTOUCH 100 UNIT/ML] 9 mL 2    Sig: INJECT 30 UNITS INTO THE SKIN AT BEDTIME.     Endocrinology:  Diabetes - Insulins Passed - 12/10/2023  5:48 PM      Passed - HBA1C is between 0 and 7.9 and within 180 days    Hemoglobin A1C  Date Value Ref Range Status  11/12/2013 13.4 (H) 4.2 - 6.3 % Final    Comment:    The American Diabetes Association recommends that a primary goal of therapy should be <7% and that physicians should reevaluate the treatment regimen in patients with HbA1c values consistently >8%.    HB A1C (BAYER DCA - WAIVED)  Date Value Ref Range Status  10/31/2023 5.9 (H) 4.8 - 5.6 % Final    Comment:             Prediabetes: 5.7 - 6.4          Diabetes: >6.4          Glycemic control for adults with diabetes: <7.0          Passed - Valid encounter within last 6 months    Recent Outpatient Visits           1 month ago Type 2 diabetes mellitus with stage 3b chronic kidney disease, with long-term current use of insulin  (HCC)   [place] [facility] [place], [place] T, NP   3 months ago Diabetes mellitus treated with insulin  (HCC)   [place] [facility] [place], [place] T, NP   5 months ago Type 2 diabetes mellitus with stage 3b chronic kidney disease, with long-term current use of insulin  (HCC)   [place] [facility] [place], Jolene T, NP   7 months ago Type 2 diabetes mellitus with stage 3b chronic kidney disease, with long-term current use of insulin  (HCC)   [place] [facility] [place], Melanie DASEN, NP

## 2023-12-24 ENCOUNTER — Encounter: Payer: Self-pay | Admitting: Nurse Practitioner

## 2023-12-24 ENCOUNTER — Ambulatory Visit
Admission: RE | Admit: 2023-12-24 | Discharge: 2023-12-24 | Disposition: A | Discharge status: Home / Self Care | Source: Ambulatory Visit | Attending: Nurse Practitioner | Admitting: Nurse Practitioner

## 2023-12-24 ENCOUNTER — Ambulatory Visit: Payer: Self-pay | Admitting: Nurse Practitioner

## 2023-12-24 DIAGNOSIS — M8588 Other specified disorders of bone density and structure, other site: Secondary | ICD-10-CM | POA: Insufficient documentation

## 2023-12-24 NOTE — Progress Notes (Signed)
 Good morning, please let Kara Leach know her bone density returned and is now showing osteoporosis, or fragile bones.  We will discuss this more at her upcoming visit and discuss treatment recommendations.

## 2024-01-28 ENCOUNTER — Inpatient Hospital Stay: Payer: Medicare HMO | Attending: Oncology

## 2024-01-28 DIAGNOSIS — E119 Type 2 diabetes mellitus without complications: Secondary | ICD-10-CM | POA: Diagnosis not present

## 2024-01-28 DIAGNOSIS — Z800 Family history of malignant neoplasm of digestive organs: Secondary | ICD-10-CM | POA: Insufficient documentation

## 2024-01-28 DIAGNOSIS — E1122 Type 2 diabetes mellitus with diabetic chronic kidney disease: Secondary | ICD-10-CM

## 2024-01-28 DIAGNOSIS — D472 Monoclonal gammopathy: Secondary | ICD-10-CM | POA: Insufficient documentation

## 2024-01-28 DIAGNOSIS — Z803 Family history of malignant neoplasm of breast: Secondary | ICD-10-CM | POA: Insufficient documentation

## 2024-01-28 LAB — BASIC METABOLIC PANEL - CANCER CENTER ONLY
Anion gap: 10 (ref 5–15)
BUN: 16 mg/dL (ref 8–23)
CO2: 25 mmol/L (ref 22–32)
Calcium: 9.5 mg/dL (ref 8.9–10.3)
Chloride: 104 mmol/L (ref 98–111)
Creatinine: 0.9 mg/dL (ref 0.44–1.00)
GFR, Estimated: >60 mL/min (ref >60)
Glucose, Bld: 90 mg/dL (ref 70–99)
Potassium: 3.8 mmol/L (ref 3.5–5.1)
Sodium: 139 mmol/L (ref 135–145)

## 2024-01-28 LAB — CBC WITH DIFFERENTIAL (CANCER CENTER ONLY)
Abs Immature Granulocytes: 0.01 K/uL (ref 0.00–0.07)
Basophils Absolute: 0.1 K/uL (ref 0.0–0.1)
Basophils Relative: 1 %
Eosinophils Absolute: 0.3 K/uL (ref 0.0–0.5)
Eosinophils Relative: 4 %
HCT: 40.4 % (ref 36.0–46.0)
Hemoglobin: 13.6 g/dL (ref 12.0–15.0)
Immature Granulocytes: 0 %
Lymphocytes Relative: 47 %
Lymphs Abs: 3.4 K/uL (ref 0.7–4.0)
MCH: 30.7 pg (ref 26.0–34.0)
MCHC: 33.7 g/dL (ref 30.0–36.0)
MCV: 91.2 fL (ref 80.0–100.0)
Monocytes Absolute: 0.5 K/uL (ref 0.1–1.0)
Monocytes Relative: 6 %
Neutro Abs: 3 K/uL (ref 1.7–7.7)
Neutrophils Relative %: 42 %
Platelet Count: 234 K/uL (ref 150–400)
RBC: 4.43 MIL/uL (ref 3.87–5.11)
RDW: 13.2 % (ref 11.5–15.5)
WBC Count: 7.2 K/uL (ref 4.0–10.5)
nRBC: 0 % (ref 0.0–0.2)

## 2024-01-29 LAB — KAPPA/LAMBDA LIGHT CHAINS
Kappa free light chain: 42.4 mg/L — ABNORMAL HIGH (ref 3.3–19.4)
Kappa, lambda light chain ratio: 1.79 — ABNORMAL HIGH (ref 0.26–1.65)
Lambda free light chains: 23.7 mg/L (ref 5.7–26.3)

## 2024-01-30 LAB — PROTEIN ELECTROPHORESIS, SERUM
A/G Ratio: 1.1 (ref 0.7–1.7)
Albumin ELP: 3.9 g/dL (ref 2.9–4.4)
Alpha-1-Globulin: 0.3 g/dL (ref 0.0–0.4)
Alpha-2-Globulin: 0.9 g/dL (ref 0.4–1.0)
Beta Globulin: 0.9 g/dL (ref 0.7–1.3)
Gamma Globulin: 1.5 g/dL (ref 0.4–1.8)
Globulin, Total: 3.6 g/dL (ref 2.2–3.9)
M-Spike, %: 0.8 g/dL — ABNORMAL HIGH
Total Protein ELP: 7.5 g/dL (ref 6.0–8.5)

## 2024-01-30 LAB — IGG, IGA, IGM
IgA: 76 mg/dL (ref 64–422)
IgG (Immunoglobin G), Serum: 1544 mg/dL (ref 586–1602)
IgM (Immunoglobulin M), Srm: 120 mg/dL (ref 26–217)

## 2024-02-01 NOTE — Patient Instructions (Incomplete)

## 2024-02-04 ENCOUNTER — Inpatient Hospital Stay: Payer: Medicare HMO | Admitting: Oncology

## 2024-02-04 ENCOUNTER — Encounter: Payer: Self-pay | Admitting: Oncology

## 2024-02-04 VITALS — BP 128/82 | HR 77 | Temp 97.3°F | Resp 18 | Ht 61.0 in | Wt 132.0 lb

## 2024-02-04 DIAGNOSIS — Z803 Family history of malignant neoplasm of breast: Secondary | ICD-10-CM | POA: Diagnosis not present

## 2024-02-04 DIAGNOSIS — Z800 Family history of malignant neoplasm of digestive organs: Secondary | ICD-10-CM | POA: Diagnosis not present

## 2024-02-04 DIAGNOSIS — D472 Monoclonal gammopathy: Secondary | ICD-10-CM | POA: Diagnosis not present

## 2024-02-04 DIAGNOSIS — E119 Type 2 diabetes mellitus without complications: Secondary | ICD-10-CM | POA: Diagnosis not present

## 2024-02-04 NOTE — Progress Notes (Unsigned)
 [facility]  [phone] [phone]) [phone]  ID: Kara Leach OB: 1950/04/27  MR#: [id number]  RDW#:[id number]  Patient Care Team: Valerio Melanie DASEN, NP as PCP - General (Nurse Practitioner) Marcelino Gales, MD (Nephrology) Jacobo Kara PARAS, MD as Consulting Physician (Oncology) Jaye Fallow, MD as Referring Physician (Ophthalmology)  CHIEF COMPLAINT: MGUS  INTERVAL HISTORY: Patient returns to clinic today for routine yearly evaluation and discussion of her laboratory results.  She has intentional weight loss over the past year.  She currently feels well and is asymptomatic.  She has no neurologic complaints.  She denies any recent fevers or illnesses.  She has a good appetite and denies weight loss.  She denies any pain.  She has no chest pain, shortness of breath, cough, or hemoptysis.  She denies any nausea, vomiting, constipation, or diarrhea.  She has no urinary complaints.  Patient offers no specific complaints today.  REVIEW OF SYSTEMS:   Review of Systems  Constitutional: Negative.  Negative for fever, malaise/fatigue and weight loss.  Respiratory: Negative.  Negative for shortness of breath.   Cardiovascular: Negative.  Negative for chest pain and leg swelling.  Gastrointestinal: Negative.  Negative for abdominal pain.  Genitourinary: Negative.  Negative for dysuria.  Musculoskeletal: Negative.  Negative for back pain.  Skin: Negative.  Negative for rash.  Neurological: Negative.  Negative for sensory change, focal weakness, weakness and headaches.  Psychiatric/Behavioral: Negative.  The patient is not nervous/anxious.     As per HPI. Otherwise, a complete review of systems is negative.  PAST MEDICAL HISTORY: Past Medical History:  Diagnosis Date   Allergy    Anxiety    Arthritis    Osteoarthritis   Cancer (HCC) 05/2018   possibly on left breast but not sure   Chronic kidney disease    Depression    Diabetes mellitus  without complication (HCC) 2012   Diabetic neuropathy (HCC)    feet   Diverticulitis 2017   GERD (gastroesophageal reflux disease)    Headache    sinus and migraines on occasion   Hyperlipidemia    Hypertension    Last menstrual period (LMP) > 10 days ago 1986   Obesity    Peripheral vascular disease    neuropathy in hands and feet d/t diabetes   Renal insufficiency    Stage 2 kidney disease   Seizures (HCC)    due to high blood sugars. last time = 09/2014   Sleep apnea    has CPAP, can't tolerate   Stroke (HCC) 1980   minor. left side remains weaker than right   Wears hearing aid    right side   Wrist weakness    left - after fracture and repair    PAST SURGICAL HISTORY: Past Surgical History:  Procedure Laterality Date   ABDOMINAL HYSTERECTOMY  1986   APPENDECTOMY  1964   BREAST BIOPSY Left 07/03/2016   benign, fibrocystic changes with calcifications, usual ductal hyperplasia.    BREAST BIOPSY Left 04/23/2018   radial scar   BREAST BIOPSY Left 08/13/2018   Procedure: WIDE LOCALIZATION AND OPEN BIOPSY OF LEFT BREAST, DIABETIC;  Surgeon: Dessa Reyes ORN, MD;  Location: [facility];  Service: General;  Laterality: Left;   BREAST EXCISIONAL BIOPSY Left 08/13/2018   CHOLECYSTECTOMY  2012   COLONOSCOPY WITH PROPOFOL  N/A 12/01/2015   Procedure: COLONOSCOPY WITH PROPOFOL ;  Surgeon: Rogelia Copping, MD;  Location: [facility];  Service: Endoscopy;  Laterality: N/A;  Diabetic - insulin  Sleep apnea  PATELLA FRACTURE SURGERY Left    1. had to recenter kneecap, 2 and 3 surgeries were arthroscopies   WRIST FRACTURE SURGERY Left 2005   Steel plate    FAMILY HISTORY Family History  Problem Relation Age of Onset   Diabetes Mother    Heart disease Mother    Hypertension Mother    Cancer Father        stomach   Diabetes Sister    Arthritis Sister    Stroke Sister    Hypertension Sister    Breast cancer Maternal Aunt 8       great aunt   Parkinson's disease Maternal  Grandmother    Tremor Maternal Grandmother        ADVANCED DIRECTIVES:    HEALTH MAINTENANCE: Social History   Tobacco Use   Smoking status: Never   Smokeless tobacco: Never  Vaping Use   Vaping status: Never Used  Substance Use Topics   Alcohol use: Yes    Comment: occasionally holiday, weddings   Drug use: No     Colonoscopy:  PAP:  Bone density:  Lipid panel:  Allergies  Allergen Reactions   Strawberry Extract Swelling    Lips swell and rash/whelps over body Difficulty breathing   Ibuprofen Other (See Comments)    ringing in ears, dizziness Also, Advil / Aleve / Motrin   Cat Dander Other (See Comments)    Sneezing    Current Outpatient Medications  Medication Sig Dispense Refill   acetaminophen  (TYLENOL ) 650 MG CR tablet Take 650 mg by mouth every 8 (eight) hours as needed for pain.     allopurinol  (ZYLOPRIM ) 100 MG tablet Take 1 tablet (100 mg total) by mouth daily. 90 tablet 4   aspirin  EC 81 MG tablet Take 81 mg by mouth daily.     Blood Glucose Monitoring Suppl (ONETOUCH VERIO REFLECT) w/Device KIT      Calcium  Carb-Cholecalciferol (CALCIUM  600 + D PO) Take 1 tablet by mouth 2 (two) times daily. 1000 IU VITAMIN D      dapagliflozin propanediol (FARXIGA) 10 MG TABS tablet Take 10 mg by mouth daily.     enalapril  (VASOTEC ) 2.5 MG tablet Take 1 tablet (2.5 mg total) by mouth daily. 90 tablet 4   fluticasone  (FLONASE ) 50 MCG/ACT nasal spray PLACE 2 SPRAYS INTO BOTH NOSTRILS DAILY AS NEEDED FOR ALLERGIES. 48 mL 1   glucose blood (ONETOUCH ULTRA) test strip USE TO CHECK BLOOD SUGAR TWICE DAILY 100 strip 8   insulin  degludec (TRESIBA  FLEXTOUCH) 100 UNIT/ML FlexTouch Pen INJECT 30 UNITS INTO THE SKIN AT BEDTIME. 9 mL 2   Insulin  Pen Needle (BD PEN NEEDLE NANO U/F) 32G X 4 MM MISC To use for insulin  injection daily 100 each 12   Lancets (ONETOUCH DELICA PLUS LANCET30G) MISC Use to check blood sugars 3 to 4 times a day. 200 each 4   Loratadine (CLARITIN PO) Take 10  mg by mouth as needed.     MELATONIN PO Take by mouth as needed.     metoprolol  succinate (TOPROL -XL) 25 MG 24 hr tablet Take 1 tablet (25 mg total) by mouth daily. 90 tablet 4   Semaglutide , 1 MG/DOSE, 4 MG/3ML SOPN Inject 1 mg as directed once a week. 9 mL 4   sertraline  (ZOLOFT ) 100 MG tablet Take 1 tablet (100 mg total) by mouth every morning. 90 tablet 4   simvastatin  (ZOCOR ) 40 MG tablet TAKE 1 TABLET BY MOUTH EVERYDAY AT BEDTIME 90 tablet 4   solifenacin  (VESICARE )  10 MG tablet Take 1 tablet (10 mg total) by mouth daily. 90 tablet 4   Specialty Vitamins Products (VITA-RX DIABETIC VITAMIN) CAPS Take 1 packet by mouth daily. Diabetic Vitamins - 6 or 7 in 1 pack     No current facility-administered medications for this visit.    OBJECTIVE: Vitals:   02/04/24 1055  BP: 128/82  Pulse: 77  Resp: 18  Temp: (!) 97.3 F (36.3 C)  SpO2: 100%     Body mass index is 24.94 kg/m.    ECOG FS:0 - Asymptomatic  General: Well-developed, well-nourished, no acute distress. Eyes: Pink conjunctiva, anicteric sclera. HEENT: Normocephalic, moist mucous membranes. Lungs: No audible wheezing or coughing. Heart: Regular rate and rhythm. Abdomen: Soft, nontender, no obvious distention. Musculoskeletal: No edema, cyanosis, or clubbing. Neuro: Alert, answering all questions appropriately. Cranial nerves grossly intact. Skin: No rashes or petechiae noted. Psych: Normal affect.   LAB RESULTS:  Lab Results  Component Value Date   NA 139 01/28/2024   K 3.8 01/28/2024   CL 104 01/28/2024   CO2 25 01/28/2024   GLUCOSE 90 01/28/2024   BUN 16 01/28/2024   CREATININE 0.90 01/28/2024   CALCIUM  9.5 01/28/2024   PROT 6.7 10/31/2023   ALBUMIN 4.1 10/31/2023   AST 30 10/31/2023   ALT 18 10/31/2023   ALKPHOS 108 10/31/2023   BILITOT 0.2 10/31/2023   GFRNONAA >60 01/28/2024   GFRAA 49 (L) 02/25/2020    Lab Results  Component Value Date   WBC 7.2 01/28/2024   NEUTROABS 3.0 01/28/2024   HGB 13.6  01/28/2024   HCT 40.4 01/28/2024   MCV 91.2 01/28/2024   PLT 234 01/28/2024   Lab Results  Component Value Date   TOTALPROTELP 7.5 01/28/2024   ALBUMINELP 3.9 01/28/2024   A1GS 0.3 01/28/2024   A2GS 0.9 01/28/2024   BETS 0.9 01/28/2024   GAMS 1.5 01/28/2024   MSPIKE 0.8 (H) 01/28/2024   SPEI Comment 01/28/2024     STUDIES: No results found.  ASSESSMENT: MGUS  PLAN:    MGUS: Chronic and unchanged.  Patient's M spike has ranged between 0.5 and 0.8 since March 2015.  Kappa free light chains have ranged between 35.5 and 58.7 over the same timeframe.  Immunoglobulins continue to be within normal limits.  Today's results are pending at time of dictation.  No intervention is needed at this time.  Patient does not require bone marrow biopsy or imaging.  Return to clinic in 1 year with repeat laboratory work and further evaluation.  If her laboratory work remains stable, this will be 10 years with no progression of laboratory work and patient can be discharged from clinic.   Chronic renal insufficiency: Chronic and unchanged.  Patient's most recent creatinine is 1.19.  Continue follow-up with nephrology as scheduled.   Hyperglycemia: Resolved.  Patient has improved blood glucose control.  Leukocytosis: Likely reactive.   Patient expressed understanding and was in agreement with this plan. She also understands that She can call clinic at any time with any questions, concerns, or complaints.   Kara JINNY Reusing, MD   02/04/2024 11:23 AM

## 2024-02-05 ENCOUNTER — Ambulatory Visit: Admitting: Nurse Practitioner

## 2024-02-05 ENCOUNTER — Telehealth: Payer: Self-pay | Admitting: Nurse Practitioner

## 2024-02-05 ENCOUNTER — Other Ambulatory Visit: Payer: Self-pay | Admitting: Nurse Practitioner

## 2024-02-05 DIAGNOSIS — E1169 Type 2 diabetes mellitus with other specified complication: Secondary | ICD-10-CM

## 2024-02-05 DIAGNOSIS — Z23 Encounter for immunization: Secondary | ICD-10-CM

## 2024-02-05 DIAGNOSIS — Z794 Long term (current) use of insulin: Secondary | ICD-10-CM

## 2024-02-05 DIAGNOSIS — D472 Monoclonal gammopathy: Secondary | ICD-10-CM

## 2024-02-05 DIAGNOSIS — F324 Major depressive disorder, single episode, in partial remission: Secondary | ICD-10-CM

## 2024-02-05 DIAGNOSIS — M810 Age-related osteoporosis without current pathological fracture: Secondary | ICD-10-CM

## 2024-02-05 DIAGNOSIS — I152 Hypertension secondary to endocrine disorders: Secondary | ICD-10-CM

## 2024-02-05 NOTE — Telephone Encounter (Signed)
 Called patient to get her rescheduled for her missed but the call could not be completed.

## 2024-02-07 NOTE — Telephone Encounter (Signed)
 Requested Prescriptions  Pending Prescriptions Disp Refills   solifenacin  (VESICARE ) 10 MG tablet [Pharmacy Med Name: SOLIFENACIN  10 MG TABLET] 90 tablet 4    Sig: TAKE 1 TABLET BY MOUTH EVERY DAY     Urology:  Bladder Agents 2 Passed - 02/07/2024 12:14 PM      Passed - Cr in normal range and within 360 days    Creatinine  Date Value Ref Range Status  01/28/2024 0.90 0.44 - 1.00 mg/dL Final  96/70/7983 8.92 (H) mg/dL Final    Comment:    9.55-8.99 NOTE: New Reference Range  05/25/14          Passed - ALT in normal range and within 360 days    ALT  Date Value Ref Range Status  10/31/2023 18 0 - 32 IU/L Final   SGPT (ALT)  Date Value Ref Range Status  11/12/2013 33 U/L Final    Comment:    14-63 NOTE: New Reference Range 10/06/13    ALT (SGPT) Piccolo, Waived  Date Value Ref Range Status  05/15/2018 25 10 - 47 U/L Final         Passed - AST in normal range and within 360 days    AST  Date Value Ref Range Status  10/31/2023 30 0 - 40 IU/L Final   SGOT(AST)  Date Value Ref Range Status  11/12/2013 30 15 - 37 Unit/L Final   AST (SGOT) Piccolo, Waived  Date Value Ref Range Status  05/15/2018 45 (H) 11 - 38 U/L Final         Passed - Valid encounter within last 12 months    Recent Outpatient Visits           3 months ago Type 2 diabetes mellitus with stage 3b chronic kidney disease, with long-term current use of insulin  (HCC)   [place] [facility] Powell, Westbrook T, NP   5 months ago Diabetes mellitus treated with insulin  (HCC)   [place] [facility] [place], Jolene T, NP   7 months ago Type 2 diabetes mellitus with stage 3b chronic kidney disease, with long-term current use of insulin  (HCC)   [place] [facility] [place], Jolene T, NP   9 months ago Type 2 diabetes mellitus with stage 3b chronic kidney disease, with long-term current use of insulin  (HCC)   [facility] [place], Melanie DASEN,  NP

## 2024-02-10 ENCOUNTER — Telehealth: Payer: Self-pay | Admitting: Nurse Practitioner

## 2024-02-10 NOTE — Telephone Encounter (Signed)
 Called patient to get her rescheduled for her missed visit. Call could  not be completed as dialed.

## 2024-02-10 NOTE — Telephone Encounter (Signed)
-----   Message from JOLENE T [facility] sent at 02/05/2024  9:44 AM EST ----- Please call patient to reschedule her visit today that she missed, it is very rare for her to miss a visit so I get a bit worried.

## 2024-10-20 ENCOUNTER — Ambulatory Visit
